# Patient Record
Sex: Male | Born: 1960 | Race: White | Hispanic: No | Marital: Married | Smoking: Former smoker
Health system: Southern US, Community
[De-identification: ages and names within clinical notes are randomized; demographics above are authoritative.]

## PROBLEM LIST (undated history)

## (undated) DIAGNOSIS — F331 Major depressive disorder, recurrent, moderate: Secondary | ICD-10-CM

## (undated) DIAGNOSIS — Z720 Tobacco use: Secondary | ICD-10-CM

## (undated) DIAGNOSIS — S62102A Fracture of unspecified carpal bone, left wrist, initial encounter for closed fracture: Secondary | ICD-10-CM

## (undated) DIAGNOSIS — M542 Cervicalgia: Secondary | ICD-10-CM

## (undated) DIAGNOSIS — E785 Hyperlipidemia, unspecified: Secondary | ICD-10-CM

## (undated) DIAGNOSIS — K219 Gastro-esophageal reflux disease without esophagitis: Secondary | ICD-10-CM

## (undated) DIAGNOSIS — K449 Diaphragmatic hernia without obstruction or gangrene: Secondary | ICD-10-CM

## (undated) DIAGNOSIS — G8929 Other chronic pain: Secondary | ICD-10-CM

## (undated) DIAGNOSIS — Z820 Family history of epilepsy and other diseases of the nervous system: Secondary | ICD-10-CM

## (undated) HISTORY — DX: Diaphragmatic hernia without obstruction or gangrene: K44.9

## (undated) HISTORY — DX: Cervicalgia: M54.2

## (undated) HISTORY — DX: Fracture of unspecified carpal bone, left wrist, initial encounter for closed fracture: S62.102A

## (undated) HISTORY — DX: Major depressive disorder, recurrent, moderate: F33.1

## (undated) HISTORY — DX: Tobacco use: Z72.0

## (undated) HISTORY — DX: Hyperlipidemia, unspecified: E78.5

## (undated) HISTORY — DX: Other chronic pain: G89.29

## (undated) HISTORY — DX: Gastro-esophageal reflux disease without esophagitis: K21.9

## (undated) HISTORY — DX: Family history of epilepsy and other diseases of the nervous system: Z82.0

## (undated) HISTORY — PX: NECK SURGERY: SHX720

---

## 2004-04-10 ENCOUNTER — Ambulatory Visit: Payer: Self-pay | Admitting: Internal Medicine

## 2004-05-20 ENCOUNTER — Ambulatory Visit: Payer: Self-pay | Admitting: Internal Medicine

## 2006-02-22 ENCOUNTER — Ambulatory Visit: Payer: Self-pay | Admitting: Family Medicine

## 2006-02-24 ENCOUNTER — Ambulatory Visit: Payer: Self-pay | Admitting: Family Medicine

## 2008-11-14 ENCOUNTER — Ambulatory Visit: Payer: Self-pay | Admitting: Family Medicine

## 2008-11-14 DIAGNOSIS — R5383 Other fatigue: Secondary | ICD-10-CM | POA: Insufficient documentation

## 2008-11-15 DIAGNOSIS — E785 Hyperlipidemia, unspecified: Secondary | ICD-10-CM | POA: Insufficient documentation

## 2008-11-15 DIAGNOSIS — K219 Gastro-esophageal reflux disease without esophagitis: Secondary | ICD-10-CM | POA: Insufficient documentation

## 2008-11-20 ENCOUNTER — Telehealth (INDEPENDENT_AMBULATORY_CARE_PROVIDER_SITE_OTHER): Payer: Self-pay | Admitting: *Deleted

## 2008-11-21 ENCOUNTER — Ambulatory Visit: Payer: Self-pay

## 2008-11-21 ENCOUNTER — Encounter (HOSPITAL_COMMUNITY)
Admission: RE | Admit: 2008-11-21 | Discharge: 2009-02-19 | Payer: Self-pay | Source: Home / Self Care | Admitting: Cardiovascular Disease

## 2008-11-21 ENCOUNTER — Ambulatory Visit: Payer: Self-pay | Admitting: Cardiovascular Disease

## 2008-11-22 ENCOUNTER — Telehealth: Payer: Self-pay | Admitting: Family Medicine

## 2008-12-19 ENCOUNTER — Ambulatory Visit: Payer: Self-pay | Admitting: Family Medicine

## 2008-12-19 DIAGNOSIS — E291 Testicular hypofunction: Secondary | ICD-10-CM | POA: Insufficient documentation

## 2008-12-21 LAB — CONVERTED CEMR LAB: Testosterone: 195.43 ng/dL — ABNORMAL LOW (ref 350.00–890.00)

## 2008-12-30 ENCOUNTER — Encounter: Payer: Self-pay | Admitting: Family Medicine

## 2009-01-11 ENCOUNTER — Ambulatory Visit: Payer: Self-pay | Admitting: Family Medicine

## 2009-01-14 LAB — CONVERTED CEMR LAB
ALT: 33 units/L (ref 0–53)
AST: 22 units/L (ref 0–37)
Alkaline Phosphatase: 69 units/L (ref 39–117)
Bilirubin, Direct: 0 mg/dL (ref 0.0–0.3)
Testosterone: 291.86 ng/dL — ABNORMAL LOW (ref 350.00–890.00)
Total Bilirubin: 0.5 mg/dL (ref 0.3–1.2)
Total Protein: 6.3 g/dL (ref 6.0–8.3)

## 2009-01-22 ENCOUNTER — Telehealth: Payer: Self-pay | Admitting: Family Medicine

## 2009-01-30 ENCOUNTER — Ambulatory Visit (HOSPITAL_COMMUNITY): Admission: RE | Admit: 2009-01-30 | Discharge: 2009-01-31 | Payer: Self-pay | Admitting: Orthopedic Surgery

## 2009-02-20 ENCOUNTER — Telehealth: Payer: Self-pay | Admitting: Family Medicine

## 2009-02-27 ENCOUNTER — Telehealth: Payer: Self-pay | Admitting: Family Medicine

## 2009-03-03 ENCOUNTER — Emergency Department (HOSPITAL_COMMUNITY)
Admission: EM | Admit: 2009-03-03 | Discharge: 2009-03-03 | Payer: Self-pay | Source: Home / Self Care | Admitting: Emergency Medicine

## 2009-03-04 ENCOUNTER — Telehealth (INDEPENDENT_AMBULATORY_CARE_PROVIDER_SITE_OTHER): Payer: Self-pay | Admitting: *Deleted

## 2009-03-06 ENCOUNTER — Ambulatory Visit: Payer: Self-pay | Admitting: Family Medicine

## 2009-03-06 DIAGNOSIS — F411 Generalized anxiety disorder: Secondary | ICD-10-CM | POA: Insufficient documentation

## 2009-04-17 ENCOUNTER — Telehealth: Payer: Self-pay | Admitting: Family Medicine

## 2009-05-08 ENCOUNTER — Ambulatory Visit: Payer: Self-pay | Admitting: Family Medicine

## 2009-07-22 ENCOUNTER — Ambulatory Visit: Payer: Self-pay | Admitting: Family Medicine

## 2010-01-22 ENCOUNTER — Encounter: Payer: Self-pay | Admitting: Family Medicine

## 2010-01-22 ENCOUNTER — Ambulatory Visit: Payer: Self-pay | Admitting: Family Medicine

## 2010-01-22 DIAGNOSIS — M5030 Other cervical disc degeneration, unspecified cervical region: Secondary | ICD-10-CM | POA: Insufficient documentation

## 2010-01-22 DIAGNOSIS — Z87891 Personal history of nicotine dependence: Secondary | ICD-10-CM | POA: Insufficient documentation

## 2010-01-23 LAB — CONVERTED CEMR LAB
ALT: 41 units/L (ref 0–53)
Albumin: 3.9 g/dL (ref 3.5–5.2)
BUN: 13 mg/dL (ref 6–23)
CO2: 30 meq/L (ref 19–32)
Calcium: 9.4 mg/dL (ref 8.4–10.5)
Creatinine, Ser: 1 mg/dL (ref 0.4–1.5)
Eosinophils Absolute: 0.1 10*3/uL (ref 0.0–0.7)
GFR calc non Af Amer: 86.32 mL/min (ref >60.00)
Glucose, Bld: 74 mg/dL (ref 70–99)
Lymphocytes Relative: 33.6 % (ref 12.0–46.0)
MCHC: 34.6 g/dL (ref 30.0–36.0)
Monocytes Absolute: 0.8 10*3/uL (ref 0.1–1.0)
Monocytes Relative: 11.1 % (ref 3.0–12.0)
Neutro Abs: 4 10*3/uL (ref 1.4–7.7)
Neutrophils Relative %: 53.8 % (ref 43.0–77.0)
Total Protein: 6.6 g/dL (ref 6.0–8.3)

## 2010-01-28 ENCOUNTER — Telehealth: Payer: Self-pay | Admitting: Family Medicine

## 2010-01-30 ENCOUNTER — Observation Stay (HOSPITAL_COMMUNITY)
Admission: RE | Admit: 2010-01-30 | Discharge: 2010-01-31 | Payer: Self-pay | Source: Home / Self Care | Attending: Orthopedic Surgery | Admitting: Orthopedic Surgery

## 2010-03-02 LAB — CONVERTED CEMR LAB
ALT: 25 units/L (ref 0–53)
Albumin: 4 g/dL (ref 3.5–5.2)
Alkaline Phosphatase: 88 units/L (ref 39–117)
Basophils Absolute: 0 10*3/uL (ref 0.0–0.1)
CO2: 25 meq/L (ref 19–32)
Calcium: 9.4 mg/dL (ref 8.4–10.5)
Chloride: 105 meq/L (ref 96–112)
Direct LDL: 200.4 mg/dL
Eosinophils Absolute: 0.1 10*3/uL (ref 0.0–0.7)
HCT: 43.7 % (ref 39.0–52.0)
Lymphocytes Relative: 18.7 % (ref 12.0–46.0)
Lymphs Abs: 2.6 10*3/uL (ref 0.7–4.0)
MCHC: 34.3 g/dL (ref 30.0–36.0)
MCV: 93.6 fL (ref 78.0–100.0)
Sodium: 141 meq/L (ref 135–145)
Total Bilirubin: 0.8 mg/dL (ref 0.3–1.2)
Total Protein: 6.9 g/dL (ref 6.0–8.3)

## 2010-03-04 NOTE — Progress Notes (Signed)
Summary: ER f/u  Phone Note Call from Patient Call back at Home Phone [phone]   Caller: Spouse Call For: Matthew Beat MD Summary of Call: Patient's wife called stating that patient was seen in the ER last night for panic attacks and an allergic reaction and was told to f/u with there primary doctor on today.  You have no open appts.  Should I double book them or schedule for another day?  Please advise Initial call taken by: Linde Gillis CMA Duncan Dull),  March 04, 2009 11:46 AM  Follow-up for Phone Call        Called patient and reviewed case. No breathing difficulties. Had some rash yest. Had some trouble breathing, went to ER  Felt to have some anxiety attacks - has ativan  Lyla Son, please schedule him an appointment with me on Wednesday morning. and call him Follow-up by: Matthew Beat MD,  March 04, 2009 2:01 PM  Additional Follow-up for Phone Call Additional follow up Details #1::        I called pt. and he said he could be here @ 8:00 on Wednesday,Feb.2.  I'll put him on the schedule when I open it up on Tuesday afternoon. Additional Follow-up by: Beau Fanny,  March 04, 2009 2:11 PM    Additional Follow-up for Phone Call Additional follow up Details #2::    ok Follow-up by: Matthew Beat MD,  March 04, 2009 2:22 PM

## 2010-03-04 NOTE — Assessment & Plan Note (Signed)
Summary: ANXIETY/CLE   Vital Signs:  Patient profile:   50 year old male Weight:      164.75 pounds BMI:     24.42 Temp:     98.1 degrees F oral Pulse rate:   60 / minute Pulse rhythm:   regular BP sitting:   110 / 70  (right arm) Cuff size:   regular  Vitals Entered By: Linde Gillis CMA Duncan Dull) (March 06, 2009 8:43 AM) CC: allergic reaction   History of Present Illness: 50 year old male:  Just had some neck surgery. Wearing a cervical collar.   Feels like the room feels stuffy - like there is not much air moving felt like he could not breathe much. Had to go outside. Had some blotches.   Felt a little better last night. But this morning - felt like he was hot this morning.   Having some difficulty with sleeping. He is interested in doing some things. Limited some with his neck. Not sleeping well  Went to ER, allergy?, anxiety Has some Ativan that he has used a couple of times  ROS: GEN: No acute illnesses, no fevers, chills, sweats, fatigue, weight loss, or URI sx. GI: No n/v/d Pulm: No SOB, cough, wheezing Interactive and getting along well at home.  Otherwise, ROS is as per the HPI.    GEN: Well-developed,well-nourished,in no acute distress; alert,appropriate and cooperative throughout examination HEENT: Normocephalic and atraumatic without obvious abnormalities. No apparent alopecia or balding. Ears, externally no deformities PULM: Breathing comfortably in no respiratory distress EXT: No clubbing, cyanosis, or edema PSYCH: Normally interactive. Cooperative during the interview. Pleasant. Friendly and conversant. Not anxious or depressed appearing. Normal, full affect.   Current Problems (verified): 1)  Hypogonadism  (ICD-257.2) 2)  Health Maintenance Exam  (ICD-V70.0) 3)  Gerd  (ICD-530.81) 4)  Hyperlipidemia  (ICD-272.4) 5)  Fatigue  (ICD-780.79) 6)  Special Screening Malignant Neoplasm of Prostate  (ICD-V76.44)  Allergies (verified): No Known Drug  Allergies  Past History:  Past medical, surgical, family and social histories (including risk factors) reviewed, and no changes noted (except as noted below).  Past Medical History: Reviewed history from 12/30/2008 and no changes required. Neck pain, cervical spine with herniation Hyperlipidemia Tobacco abuse, 30 years GERD L wrist fx, 1980's  Family History: Reviewed history from 11/14/2008 and no changes required. Denies cardiac history  Social History: Reviewed history from 11/14/2008 and no changes required. Marital Status: Married Occupation:  Current Smoker Alcohol use-yes Drug use-no Regular exercise-no   Impression & Recommendations:  Problem # 1:  ANXIETY STATE, UNSPECIFIED (ICD-300.00) acute anxiety - somewhat situational with neck surgery  benzo for short term check tsh, test levels  His updated medication list for this problem includes:    Lorazepam 1 Mg Tabs (Lorazepam) .Marland Kitchen... 1/2 to 1 tab by mouth q 8 hours as needed anxiety  Problem # 2:  HYPOGONADISM (ICD-257.2)  Orders: Venipuncture (16109) TLB-Testosterone, Total (84403-TESTO)  Complete Medication List: 1)  Crestor 40 Mg Tabs (Rosuvastatin calcium) .... Take 1 tablet at bed times 2)  Aspirin 81 Mg Tabs (Aspirin) .... Take 1 tablet by mouth once a day 3)  Androgel Pump 1 % Gel (Testosterone) .... Apply 10 grams to dry, clean shoulder area once daily 4)  Allergy Relief 25 Mg Caps (Diphenhydramine hcl) .... Take one tablet by mouth as needed 5)  Sucrets Sore Throat 2 Mg Lozg (Dyclonine hcl) .... Take one lozenge as needed 6)  Ibuprofen 200 Mg Tabs (Ibuprofen) .... One  tablet by mouth as needed for pain 7)  Oxycodone-acetaminophen 10-325 Mg Tabs (Oxycodone-acetaminophen) .... Take one tablet by mouth every six hours as needed for pain 8)  Acid Reducer 75 Mg Tabs (Ranitidine hcl) .... Take as needed 9)  Cepacol Sore Throat 5.4 Mg Lozg (Menthol) .... Take as needed 10)  Lorazepam 1 Mg Tabs (Lorazepam)  .... 1/2 to 1 tab by mouth q 8 hours as needed anxiety  Other Orders: TLB-TSH (Thyroid Stimulating Hormone) (84443-TSH) Prescriptions: LORAZEPAM 1 MG TABS (LORAZEPAM) 1/2 to 1 tab by mouth q 8 hours as needed anxiety  #15 x 0   Entered and Authorized by:   Hannah Beat MD   Signed by:   Hannah Beat MD on 03/06/2009   Method used:   Print then Give to Patient   RxID:   [id number]   Current Allergies (reviewed today): No known allergies

## 2010-03-04 NOTE — Progress Notes (Signed)
Summary: Question about Androgel  Phone Note Call from Patient Call back at [phone]   Caller: Spouse Call For: Dr. Ermalene Searing Summary of Call: Pt finished the Androgel today and has no refills. Pt wants to know if he should stay on Androgel and if so will need to be refilled or should pt stay off Androgel and get an appt to be seen. Pt uses [facility] on [id number]. Pt's wife would like call back at [phone] to advise Dr. Daphine Deutscher decision.Please advise.  Initial call taken by: Lewanda Rife LPN,  February 20, 2009 10:44 AM  Follow-up for Phone Call        Likely remain on this med, but await [facility]'s recs tommorow.  Follow-up by: Kerby Nora MD,  February 20, 2009 1:22 PM  Additional Follow-up for Phone Call Additional follow up Details #1::        Patients wife advised.Consuello Masse CMA  Additional Follow-up by: Benny Lennert CMA Duncan Dull),  February 20, 2009 3:59 PM    Additional Follow-up for Phone Call Additional follow up Details #2::    Refilled.  They have had multiple questions in the past.   He needs follow-up labs: Check Total and Free testosterone:257.2  f/u with me 1 week after this lab Follow-up by: Hannah Beat MD,  February 20, 2009 6:01 PM  Additional Follow-up for Phone Call Additional follow up Details #3:: Details for Additional Follow-up Action Taken: Patient requested message be left on machine to advise instructions also called rx to pharamcy Additional Follow-up by: Benny Lennert CMA (AAMA),  February 21, 2009 10:05 AM  Prescriptions: ANDROGEL PUMP 1 % GEL (TESTOSTERONE) Apply 10 grams to dry, clean shoulder area once daily  #2 x 0   Entered by:   Hannah Beat MD   Authorized by:   Kerby Nora MD   Signed by:   Hannah Beat MD on 02/20/2009   Method used:   Telephoned to ...       [facility]       [address]       [place], [place]  [address]       Ph: [phone]       Fax: [phone]   RxID:    [id number]

## 2010-03-04 NOTE — Progress Notes (Signed)
Summary: Prior Authorization for Androgel  Phone Note Outgoing Call Call back at [phone]   Call placed by: Linde Gillis CMA Duncan Dull),  February 27, 2009 8:32 AM Call placed to: [employer] Summary of Call: Called [employer] spoke to [place] [facility] and was advised that this Rx was approved from 12/04/2008 through 12/24/2013.  Pharmacy was able to get claim paid for this medication on 02/24/2009.  Pharmacy notified.   Initial call taken by: Linde Gillis CMA Duncan Dull),  February 27, 2009 8:34 AM     Appended Document: Prior Authorization for [employer], [address], [place].  [phone]

## 2010-03-04 NOTE — Progress Notes (Signed)
Summary: wants to go back on lorazepam  Phone Note Call from Patient Call back at [facility] Phone [phone] Call back at [phone], [phone]   Caller: Spouse- Cala Bradford Summary of Call: Pt's wife thinks that pt needs to go back on lorazepam.  She says he is getting depressed, irritable, not sleeping.  Anxious because he has not been able to go back to work since back surgery.  Wife got him otc sleep aid but that doesnt help.  She says he did sleep better with lorazepam, was more on an even keel.   U[facility].  She knows you are out of the office today. Initial call taken by: Lowella Petties CMA,  April 17, 2009 8:29 AM  Follow-up for Phone Call        ok to call in as below - for sparing use. with symtoms above, some anxiety and depression, should follow-up within the next couple weeks Follow-up by: Hannah Beat MD,  April 17, 2009 6:31 PM  Additional Follow-up for Phone Call Additional follow up Details #1::        Spoke with carol the pharmacist [facility] she will get the script ready also spoke to patients wife and they will call for patient to schedule appt Additional Follow-up by: Benny Lennert CMA Duncan Dull),  April 18, 2009 9:47 AM     Prescriptions: LORAZEPAM 1 MG TABS (LORAZEPAM) 1/2 to 1 tab by mouth q 8 hours as needed anxiety  #30 x 0   Entered and Authorized by:   Hannah Beat MD   Signed by:   Hannah Beat MD on 04/17/2009   Method used:   Telephoned to ...       [facility]       [address]       [place], [place]  [address]       Ph: [phone]       Fax: [phone]   RxID:   [id number]

## 2010-03-04 NOTE — Assessment & Plan Note (Signed)
Summary: FOLLOW UP   Vital Signs:  Patient profile:   50 year old male Height:      69 inches Weight:      172.0 pounds BMI:     25.49 Temp:     98.2 degrees F oral Pulse rate:   60 / minute Pulse rhythm:   regular BP sitting:   110 / 78  (left arm) Cuff size:   regular  Vitals Entered By: Benny Lennert CMA Duncan Dull) (May 08, 2009 10:11 AM)  History of Present Illness: Chief complaint follow up anxiety  Anxiety: still having some nervew issues.   will feel like he cannot sleep. Felt like the relaxed and slept better with some ativan and did a lot better.      Allergies (verified): No Known Drug Allergies   Impression & Recommendations:  Problem # 1:  ANXIETY STATE, UNSPECIFIED (ICD-300.00) Assessment Deteriorated >25 minutes spent in face to face time with patient, >50% spent in counselling or coordination of care: continued anxiety, not doing well per wife, irritable, probs dealing with not working, some verbal arguments with family. Has had to use more ativan  His updated medication list for this problem includes:    Lorazepam 1 Mg Tabs (Lorazepam) .Marland Kitchen... 1/2 to 1 tab by mouth q 8 hours as needed anxiety    Citalopram Hydrobromide 20 Mg Tabs (Citalopram hydrobromide) .Marland Kitchen... 1 by mouth daily  Complete Medication List: 1)  Aspirin 81 Mg Tabs (Aspirin) .... Take 1 tablet by mouth once a day 2)  Androgel Pump 1 % Gel (Testosterone) .... Apply 10 grams to dry, clean shoulder area once daily 3)  Allergy Relief 25 Mg Caps (Diphenhydramine hcl) .... Take one tablet by mouth as needed 4)  Sucrets Sore Throat 2 Mg Lozg (Dyclonine hcl) .... Take one lozenge as needed 5)  Ibuprofen 200 Mg Tabs (Ibuprofen) .... One tablet by mouth as needed for pain 6)  Oxycodone-acetaminophen 10-325 Mg Tabs (Oxycodone-acetaminophen) .... Take one tablet by mouth every six hours as needed for pain 7)  Acid Reducer 75 Mg Tabs (Ranitidine hcl) .... Take as needed 8)  Cepacol Sore Throat 5.4 Mg Lozg  (Menthol) .... Take as needed 9)  Lorazepam 1 Mg Tabs (Lorazepam) .... 1/2 to 1 tab by mouth q 8 hours as needed anxiety 10)  Citalopram Hydrobromide 20 Mg Tabs (Citalopram hydrobromide) .Marland Kitchen.. 1 by mouth daily  Patient Instructions: 1)  2 mo Prescriptions: LORAZEPAM 1 MG TABS (LORAZEPAM) 1/2 to 1 tab by mouth q 8 hours as needed anxiety  #30 x 0   Entered and Authorized by:   Hannah Beat MD   Signed by:   Hannah Beat MD on 05/08/2009   Method used:   Print then Give to Patient   RxID:   [id number] CITALOPRAM HYDROBROMIDE 20 MG TABS (CITALOPRAM HYDROBROMIDE) 1 by mouth daily  #30 x 3   Entered and Authorized by:   Hannah Beat MD   Signed by:   Hannah Beat MD on 05/08/2009   Method used:   Electronically to        [facility]       [address]       [place], [place]  [address]       Ph: [phone]       Fax: [phone]   RxID:   [id number]   Current Allergies (reviewed today): No known allergies

## 2010-03-04 NOTE — Assessment & Plan Note (Signed)
Summary: 2 M F/U DLO   Vital Signs:  Patient profile:   50 year old male Height:      69 inches Weight:      173.4 pounds BMI:     25.70 Temp:     98.1 degrees F oral Pulse rate:   60 / minute Pulse rhythm:   regular BP sitting:   106 / 70  (left arm) Cuff size:   regular  Vitals Entered By: Benny Lennert CMA Duncan Dull) (July 22, 2009 10:21 AM)  History of Present Illness: Chief complaint 2 month follow up  50 year old male:  Anxiety / dep:    Allergies (verified): No Known Drug Allergies   Impression & Recommendations:  Problem # 1:  ANXIETY STATE, UNSPECIFIED (ICD-300.00) Assessment Improved >15 minutes spent in face to face time with patient, >50% spent in counselling or coordination of care: doing much better, effectively not feeling anxious or ill with family members now. Wants to get back to work - limited with neck and RTP.  His updated medication list for this problem includes:    Lorazepam 1 Mg Tabs (Lorazepam) .Marland Kitchen... 1/2 to 1 tab by mouth q 8 hours as needed anxiety    Citalopram Hydrobromide 20 Mg Tabs (Citalopram hydrobromide) .Marland Kitchen... 1 by mouth daily  Problem # 2:  HYPOGONADISM (ICD-257.2) refill   recheck in 6 mo with cpx labs  Complete Medication List: 1)  Aspirin 81 Mg Tabs (Aspirin) .... Take 1 tablet by mouth once a day 2)  Androgel Pump 1 % Gel (Testosterone) .... Apply 10 grams to dry, clean shoulder area once daily 3)  Ibuprofen 200 Mg Tabs (Ibuprofen) .... One tablet by mouth as needed for pain 4)  Oxycodone-acetaminophen 10-325 Mg Tabs (Oxycodone-acetaminophen) .... Take one tablet by mouth every six hours as needed for pain 5)  Acid Reducer 75 Mg Tabs (Ranitidine hcl) .... Take as needed 6)  Cepacol Sore Throat 5.4 Mg Lozg (Menthol) .... Take as needed 7)  Lorazepam 1 Mg Tabs (Lorazepam) .... 1/2 to 1 tab by mouth q 8 hours as needed anxiety 8)  Citalopram Hydrobromide 20 Mg Tabs (Citalopram hydrobromide) .Marland Kitchen.. 1 by mouth daily  Patient  Instructions: 1)  f/u 6 mo Prescriptions: ANDROGEL PUMP 1 % GEL (TESTOSTERONE) Apply 10 grams to dry, clean shoulder area once daily  #2 x 5   Entered and Authorized by:   Hannah Beat MD   Signed by:   Hannah Beat MD on 07/22/2009   Method used:   Print then Give to Patient   RxID:   [id number]   Current Allergies (reviewed today): No known allergies

## 2010-03-06 NOTE — Progress Notes (Signed)
Summary: Citalopram  Phone Note Refill Request Message from:  [facility] on January 28, 2010 9:48 AM  Refills Requested: Medication #1:  CITALOPRAM HYDROBROMIDE 20 MG TABS 1 by mouth daily   Last Refilled: 12/30/2009 Received faxed refill request please advise.   Method Requested: Telephone to Pharmacy Initial call taken by: Linde Gillis CMA Duncan Dull),  January 28, 2010 9:54 AM  Follow-up for Phone Call        patient seen last week.  refill x 1 year Follow-up by: Hannah Beat MD,  January 28, 2010 10:04 AM    Prescriptions: CITALOPRAM HYDROBROMIDE 20 MG TABS (CITALOPRAM HYDROBROMIDE) 1 by mouth daily  #30 Each x 11   Entered by:   Benny Lennert CMA (AAMA)   Authorized by:   Hannah Beat MD   Signed by:   Benny Lennert CMA (AAMA) on 01/28/2010   Method used:   Electronically to        [facility] (retail)       [address]       [place], [place]  [address]       Ph: [phone]       Fax: [phone]   RxID:   [id number]

## 2010-03-06 NOTE — Letter (Signed)
Summary: *Consult Note  [place] [facility]  [address] [place], [place] [address]   Phone: [phone]  Fax: [phone]    Re:    DIARRA KOS DOB:    Jun 29, 1960   Dear Dr. Venita Lick:   Thank you for requesting that we see the above patient for consultation.  [place] is doing well, he had a normal nuclear stress test in 2010 which we reviewed. He is a former smoker but had normal spirometry in the office today.  He accepts the risks of surgery that you have reviewed with him and is cleared from a medical and cardiac standpoint. My note and labwork is enclosed.   After today's visit, the patients current medications include: 1)  ASPIRIN 81 MG  TABS (ASPIRIN) Take 1 tablet by mouth once a day 2)  ANDROGEL PUMP 1 % GEL (TESTOSTERONE) Apply 10 grams to dry, clean shoulder area once daily 3)  IBUPROFEN 200 MG TABS (IBUPROFEN) one tablet by mouth as needed for pain 4)  ACID REDUCER 75 MG TABS (RANITIDINE HCL) take as needed 5)  CEPACOL SORE THROAT 5.4 MG LOZG (MENTHOL) take as needed 6)  LORAZEPAM 1 MG TABS (LORAZEPAM) 1/2 to 1 tab by mouth q 8 hours as needed anxiety 7)  CITALOPRAM HYDROBROMIDE 20 MG TABS (CITALOPRAM HYDROBROMIDE) 1 by mouth daily 8)  NORCO 5-325 MG TABS (HYDROCODONE-ACETAMINOPHEN) one tablet every 4-6 hours as needed for pain   Sincerely,   Hannah Beat MD

## 2010-03-06 NOTE — Assessment & Plan Note (Signed)
Summary: SURGICAL CLEARANCE  FOR NECK   Vital Signs:  Patient profile:   50 year old male Height:      69 inches Weight:      174.12 pounds BMI:     25.81 Temp:     98.1 degrees F oral Pulse rate:   60 / minute Pulse rhythm:   regular BP sitting:   110 / 70  (left arm) Cuff size:   regular  Vitals Entered By: Benny Lennert CMA Duncan Dull) (January 22, 2010 10:33 AM)   History of Present Illness: Chief complaint surgical clearence for neck surgery  Dr. Shon Baton has requested a consultation for evaluation for preoperative clearance for c5-6 neck operation for probable fusion and revision of hardware.  No active chest pain, no sob, no wheezing.  Being done under general anesthesia. Patient continues with neck pain, shoulder, shoulder blade pain.   Quit smoking about 1 year ago, but he has a 30 pack year smoking history.  Allergies (verified): No Known Drug Allergies  Past History:  Past medical, surgical, family and social histories (including risk factors) reviewed, and no changes noted (except as noted below).  Past Medical History: Reviewed history from 12/30/2008 and no changes required. Neck pain, cervical spine with herniation Hyperlipidemia Tobacco abuse, 30 years GERD L wrist fx, 1980's  Family History: Reviewed history from 11/14/2008 and no changes required. Denies cardiac history  Social History: Reviewed history from 11/14/2008 and no changes required. Marital Status: Married Occupation:  Current Smoker Alcohol use-yes Drug use-no Regular exercise-no  Review of Systems      See HPI       Otherwise, the pertinent positives and negatives are listed above and in the HPI, otherwise a full review of systems has been reviewed and is negative unless noted positive.  General:  Denies chills, fatigue, and fever. CV:  Denies chest pain or discomfort and shortness of breath with exertion. Resp:  Denies cough, shortness of breath, sputum productive, and  wheezing.  Physical Exam  General:  Well-developed,well-nourished,in no acute distress; alert,appropriate and cooperative throughout examination Head:  Normocephalic and atraumatic without obvious abnormalities. No apparent alopecia or balding. Eyes:  pupils equal, pupils round, and pupils reactive to light.   Ears:  External ear exam shows no significant lesions or deformities.  Otoscopic examination reveals clear canals, tympanic membranes are intact bilaterally without bulging, retraction, inflammation or discharge. Hearing is grossly normal bilaterally. Nose:  External nasal examination shows no deformity or inflammation. Nasal mucosa are pink and moist without lesions or exudates. Mouth:  Oral mucosa and oropharynx without lesions or exudates.  Teeth in good repair. Neck:  No deformities, masses, or tenderness noted. Chest Wall:  No deformities, masses, tenderness or gynecomastia noted. Lungs:  Normal respiratory effort, chest expands symmetrically. Lungs are clear to auscultation, no crackles or wheezes. Heart:  Normal rate and regular rhythm. S1 and S2 normal without gallop, murmur, click, rub or other extra sounds. Abdomen:  Bowel sounds positive,abdomen soft and non-tender without masses, organomegaly or hernias noted. Extremities:  No clubbing, cyanosis, edema, or deformity noted with normal full range of motion of all joints.   Neurologic:  alert & oriented X3 and gait normal.   Skin:  Intact without suspicious lesions or rashes Cervical Nodes:  No lymphadenopathy noted Psych:  Cognition and judgment appear intact. Alert and cooperative with normal attention span and concentration. No apparent delusions, illusions, hallucinations   Impression & Recommendations:  Problem # 1:  PREOPERATIVE EXAMINATION (ICD-V72.84) Recommendations:  EKG: Normal sinus rhythm. Normal axis, normal R wave progression, inverted T III and avF, IRBBB.  11/21/2008 Stress Nuclear Study normal  Check  labs, normal stress test, normal spirometry. If labs normal, patient willing to accept potential risks of surgery, recommend that the patient be considered clear from a medical and cardiac perspective for neck surgery.  cc: Dr. Shon Baton  Orders: EKG w/ Interpretation (93000) Spirometry w/Graph (94010)  Problem # 2:  DISC DISEASE, CERVICAL (ICD-722.4)  Orders: Venipuncture ([address]109) TLB-BMP (Basic Metabolic Panel-BMET) (80048-METABOL) TLB-CBC Platelet - w/Differential (85025-CBCD) TLB-Hepatic/Liver Function Pnl (80076-HEPATIC) TLB-TSH (Thyroid Stimulating Hormone) (84443-TSH)  Problem # 3:  TOBACCO ABUSE, HX OF (ICD-V15.82) Assessment: Improved  Spirometry normal. Greater than predicted FEV and FEV/FVC, normal  Orders: EKG w/ Interpretation (93000) Spirometry w/Graph (94010)  Complete Medication List: 1)  Aspirin 81 Mg Tabs (Aspirin) .... Take 1 tablet by mouth once a day 2)  Androgel Pump 1 % Gel (Testosterone) .... Apply 10 grams to dry, clean shoulder area once daily 3)  Ibuprofen 200 Mg Tabs (Ibuprofen) .... One tablet by mouth as needed for pain 4)  Acid Reducer 75 Mg Tabs (Ranitidine hcl) .... Take as needed 5)  Cepacol Sore Throat 5.4 Mg Lozg (Menthol) .... Take as needed 6)  Lorazepam 1 Mg Tabs (Lorazepam) .... 1/2 to 1 tab by mouth q 8 hours as needed anxiety 7)  Citalopram Hydrobromide 20 Mg Tabs (Citalopram hydrobromide) .Marland Kitchen.. 1 by mouth daily 8)  Norco 5-325 Mg Tabs (Hydrocodone-acetaminophen) .... One tablet every 4-6 hours as needed for pain   Orders Added: 1)  Venipuncture [36415] 2)  TLB-BMP (Basic Metabolic Panel-BMET) [80048-METABOL] 3)  TLB-CBC Platelet - w/Differential [85025-CBCD] 4)  TLB-Hepatic/Liver Function Pnl [80076-HEPATIC] 5)  TLB-TSH (Thyroid Stimulating Hormone) [84443-TSH] 6)  Consultation Level IV [99244] 7)  EKG w/ Interpretation [93000] 8)  Spirometry w/Graph [94010]    Current Allergies (reviewed today): No known  allergies   Appended Document: SURGICAL CLEARANCE  FOR NECK Copy of office note, EKG and spirometry faxed to [facility] pre admit department for upcoming appt.

## 2010-04-07 ENCOUNTER — Telehealth (INDEPENDENT_AMBULATORY_CARE_PROVIDER_SITE_OTHER): Payer: Self-pay | Admitting: *Deleted

## 2010-04-14 LAB — BASIC METABOLIC PANEL
BUN: 13 mg/dL (ref 6–23)
CO2: 29 mEq/L (ref 19–32)
Calcium: 9.7 mg/dL (ref 8.4–10.5)
Chloride: 106 mEq/L (ref 96–112)
GFR calc Af Amer: >60 mL/min (ref >60)
GFR calc non Af Amer: >60 mL/min (ref >60)
Potassium: 4.3 mEq/L (ref 3.5–5.1)
Sodium: 141 mEq/L (ref 135–145)

## 2010-04-14 LAB — CBC
Hemoglobin: 14.5 g/dL (ref 13.0–17.0)
MCHC: 33.1 g/dL (ref 30.0–36.0)
MCV: 93.8 fL (ref 78.0–100.0)
RBC: 4.67 MIL/uL (ref 4.22–5.81)
RDW: 13.2 % (ref 11.5–15.5)

## 2010-04-14 LAB — SURGICAL PCR SCREEN: MRSA, PCR: NEGATIVE

## 2010-04-15 NOTE — Progress Notes (Signed)
Summary: lorazepam  Phone Note Refill Request Message from:  Fax from Pharmacy on April 07, 2010 11:28 AM  Refills Requested: Medication #1:  LORAZEPAM 1 MG TABS 1/2 to 1 tab by mouth q 8 hours as needed anxiety   Last Refilled: 05/20/2009 Refill request from [facility] on [address] [phone]  Initial call taken by: Melody Comas,  April 07, 2010 11:29 AM  Follow-up for Phone Call        Rx called to pharmacy Follow-up by: Benny Lennert CMA (AAMA),  April 07, 2010 1:21 PM    Prescriptions: LORAZEPAM 1 MG TABS (LORAZEPAM) 1/2 to 1 tab by mouth q 8 hours as needed anxiety  #30 x 0   Entered and Authorized by:   Hannah Beat MD   Signed by:   Hannah Beat MD on 04/07/2010   Method used:   Telephoned to ...       [facility]       [address]       [place], [place]  [address]       Ph: [phone]       Fax: [phone]   RxID:   [id number]

## 2010-04-20 LAB — CBC
HCT: 44 % (ref 39.0–52.0)
MCHC: 33.4 g/dL (ref 30.0–36.0)
MCV: 94.1 fL (ref 78.0–100.0)
WBC: 5.9 10*3/uL (ref 4.0–10.5)

## 2010-04-20 LAB — BASIC METABOLIC PANEL
Calcium: 9.1 mg/dL (ref 8.4–10.5)
Chloride: 108 mEq/L (ref 96–112)
GFR calc Af Amer: >60 mL/min (ref >60)
GFR calc non Af Amer: >60 mL/min (ref >60)
Glucose, Bld: 96 mg/dL (ref 70–99)

## 2010-04-20 LAB — TSH: TSH: 1.187 u[IU]/mL (ref 0.350–4.500)

## 2010-04-20 LAB — DIFFERENTIAL
Basophils Absolute: 0 10*3/uL (ref 0.0–0.1)
Eosinophils Absolute: 0 10*3/uL (ref 0.0–0.7)
Eosinophils Relative: 0 % (ref 0–5)
Monocytes Absolute: 0.5 10*3/uL (ref 0.1–1.0)
Neutro Abs: 4.1 10*3/uL (ref 1.7–7.7)

## 2010-04-20 LAB — D-DIMER, QUANTITATIVE: D-Dimer, Quant: 0.31 ug/mL-FEU (ref 0.00–0.48)

## 2010-04-29 ENCOUNTER — Encounter: Payer: Self-pay | Admitting: Family Medicine

## 2010-05-05 LAB — CBC
HCT: 44 % (ref 39.0–52.0)
MCHC: 33.7 g/dL (ref 30.0–36.0)
MCV: 94.2 fL (ref 78.0–100.0)
Platelets: 206 10*3/uL (ref 150–400)
RDW: 13.6 % (ref 11.5–15.5)
WBC: 8.8 10*3/uL (ref 4.0–10.5)

## 2010-08-04 ENCOUNTER — Telehealth: Payer: Self-pay | Admitting: *Deleted

## 2010-08-04 NOTE — Telephone Encounter (Signed)
Noted, happy to see in office at his discretion.

## 2010-08-04 NOTE — Telephone Encounter (Signed)
Triage Record Num: [id number] Operator: Reino Bellis Patient Name: Matthew Eaton Call Date & Time: 08/02/2010 2:28:12PM Patient Phone: [phone] PCP: Hannah Beat Patient Gender: Male PCP Fax : [phone] Patient DOB: 1960/08/10 Practice Name: [facility] Reason for Call: De Burrs, calling regarding Other. PCP is Copland, [place] number is [phone]. Kimberly/Wife calling requesting something be called in for her husband's nerves. Pt. feeling very anxious, "cannot be still" and is pacing around the facility at times d/t his mother's declining health. Onset: 08/02/10. All emergent sxs r/o per Anxiety Protocol except anxiety symptoms increasing in frequency or length. Advised pt. to see MD within 72 hours. Instructed pt. to call in am on 08/04/10 to schedule an appt. and care advice given. Protocol(s) Used: Anxiety Recommended Outcome per Protocol: See Provider within 72 Hours Reason for Outcome: Anxiety symptoms increasing in frequency or length Care Advice: Avoid the use of stimulants including caffeine (coffee, some soft drinks, some energy drinks, tea and chocolate), cocaine, and amphetamines. Also avoid drinking alcohol. ~ After provider evaluation, get at least 30-60 minutes of moderate aerobic exercise, preferably each day; exercise 3 to 4 hours before you want to sleep. Moderate aerobic exercise is generally defined as requiring about as much energy as walking 2 miles in 30 minutes. ~ Exercise, yoga, massage, relaxation, prayer, music, a hot bath, or journal writing are all ways that may help calm a person. Try these and find one or more that helps to relieve your anxiety and practice it when feeling stressed or worried. ~ ~ Eat a balanced diet and follow a regular sleep schedule with adequate sleep, about 7 to 8 hours a night. Share your concern with a professional counselor or an individual you feel can help you cope by listening  objectively to your concerns. ~ Worry/Anxiety: - Does not prevent bad things from happening. - Is often about things that never happen. - When you start to worry, think about if it will matter in a week, month, or a year. - Talk with provider/counselor about the issues that cause you to worry. ~ 08/02/2010 2:43:06PM Page 1 of 1 [id number]

## 2010-08-05 ENCOUNTER — Other Ambulatory Visit: Payer: Self-pay | Admitting: *Deleted

## 2010-08-05 MED ORDER — LORAZEPAM 1 MG PO TABS: 0.5000 mg | ORAL_TABLET | Freq: Three times a day (TID) | ORAL | Status: DC | PRN

## 2010-08-05 NOTE — Telephone Encounter (Signed)
Very appropriate

## 2010-08-05 NOTE — Telephone Encounter (Signed)
Rx called to [facility] and patient notified as instructed via telephone.

## 2010-08-05 NOTE — Telephone Encounter (Signed)
Patient wife calling and patients mother passed away and patient is having hard time handling this. Wife says patient is almost out of his lorazepam and wants to know if he can have this refilled before the 4th of July. Please advise

## 2010-08-05 NOTE — Telephone Encounter (Signed)
I believe this is appropriate. He was seen last in 01/2010 and last refill of 30 tabs lasted from 04/2010. Despite this being a narcotic I will refill for Dr. Patsy Lager given I am not sure he will be able to address this prior to the holiday. I will forward this note to him.

## 2010-10-28 ENCOUNTER — Other Ambulatory Visit: Payer: Self-pay | Admitting: Otolaryngology

## 2010-10-30 ENCOUNTER — Ambulatory Visit
Admission: RE | Admit: 2010-10-30 | Discharge: 2010-10-30 | Disposition: A | Discharge status: Home / Self Care | Payer: Self-pay | Source: Ambulatory Visit | Attending: Otolaryngology | Admitting: Otolaryngology

## 2011-01-05 ENCOUNTER — Encounter: Payer: Self-pay | Admitting: Family Medicine

## 2011-01-05 ENCOUNTER — Ambulatory Visit (INDEPENDENT_AMBULATORY_CARE_PROVIDER_SITE_OTHER): Payer: Self-pay | Admitting: Family Medicine

## 2011-01-05 DIAGNOSIS — K219 Gastro-esophageal reflux disease without esophagitis: Secondary | ICD-10-CM

## 2011-01-05 DIAGNOSIS — K449 Diaphragmatic hernia without obstruction or gangrene: Secondary | ICD-10-CM | POA: Insufficient documentation

## 2011-01-05 MED ORDER — OMEPRAZOLE 20 MG PO CPDR: 20.0000 mg | DELAYED_RELEASE_CAPSULE | Freq: Two times a day (BID) | ORAL | Status: DC

## 2011-01-05 NOTE — Progress Notes (Signed)
  Subjective:    Patient ID: Matthew Eaton, male    DOB: Mar 11, 1960, 50 y.o.   MRN: [id number]  HPI  Chewing really a lot and still getting stuck in his throat.  Taking some Zantac BID.  But still having some significant symptoms.  Feels like is going to get choked -- used to like to eat steakCan eat it, but it feels like it will get hung up. Will get stuck and then go.   The PMH, PSH, Social History, Family History, Medications, and allergies have been reviewed in [facility], and have been updated if relevant.   Review of Systems  GEN: No acute illnesses, no fevers, chills. GI: above Pulm: No SOB Interactive and getting along well at home.  Otherwise, ROS is as per the HPI.     Objective:   Physical Exam   GEN: WDWN, NAD, Non-toxic, A & O x 3 HEENT: Atraumatic, Normocephalic. Neck supple. No masses, No LAD. Ears and Nose: No external deformity. CV: RRR, No M/G/R. No JVD. No thrill. No extra heart sounds. PULM: CTA B, no wheezes, crackles, rhonchi. No retractions. No resp. distress. No accessory muscle use. EXTR: No c/c/e NEURO Normal gait.  PSYCH: Normally interactive. Conversant. Not depressed or anxious appearing.  Calm demeanor.    *RADIOLOGY REPORT*   Clinical Data: Dysphagia.  History of cervical fusion.   ESOPHOGRAM/BARIUM SWALLOW   Technique:  Combined double contrast and single contrast examination performed using effervescent crystals, thick barium liquid, and thin barium liquid.   Fluoroscopy time:  1.7 minutes.   Comparison:  None   Findings:  Initial barium swallows demonstrate a mild deviation of the cervical esophagus rightward.  Mild impression on the posterior aspect of the cervical esophagus at the C5 level due to the anterior plate.  No upper esophageal webs, strictures diverticuli. The piriform sinus and vallecular air spaces appear normal.   Nonspecific esophageal motility disorder with occasional disruption of the primary peristaltic wave and  occasional tertiary contractions.   There is a moderate-to-large hiatal hernia with spontaneous and inducible GE reflux.  No strictures or masses.  A 13 mm barium pill passed into the stomach without difficulty.   IMPRESSION:   1.  Mild impression on the posterior aspect of the cervical esophagus by the upper aspect of the plate at C5. 2.  Nonspecific esophageal dysmotility. 3.  Moderate-to-large hiatal hernia with spontaneous and inducible GE reflux.   Original Report Authenticated By: P. Loralie Champagne, M.D.      Assessment & Plan:   1. GERD   2. Hiatal hernia     Significant GERD and large hiatal hernia on barium swallow. We will treat with twice a day Prilosec for now, and recheck his complete physical examination. The patient has a nasolaryngoscopy, and barium swallow, and these did not show any focal constriction. There was a mild impression at the posterior aspect of the posterior esophagus only.  If he continues to have significant impairing symptoms, full gastroenterology evaluation may be appropriate

## 2011-01-30 ENCOUNTER — Other Ambulatory Visit: Payer: Self-pay | Admitting: Family Medicine

## 2011-02-02 ENCOUNTER — Ambulatory Visit
Admission: RE | Admit: 2011-02-02 | Discharge: 2011-02-02 | Disposition: A | Discharge status: Home / Self Care | Payer: Self-pay | Source: Ambulatory Visit | Attending: Orthopedic Surgery | Admitting: Orthopedic Surgery

## 2011-02-02 ENCOUNTER — Other Ambulatory Visit: Payer: Self-pay | Admitting: Orthopedic Surgery

## 2011-02-02 DIAGNOSIS — Z981 Arthrodesis status: Secondary | ICD-10-CM

## 2011-02-12 ENCOUNTER — Telehealth: Payer: Self-pay | Admitting: Family Medicine

## 2011-02-12 DIAGNOSIS — E785 Hyperlipidemia, unspecified: Secondary | ICD-10-CM

## 2011-02-12 DIAGNOSIS — Z125 Encounter for screening for malignant neoplasm of prostate: Secondary | ICD-10-CM

## 2011-02-12 NOTE — Telephone Encounter (Signed)
Message copied by Excell Seltzer on Thu Feb 12, 2011  4:12 PM ------      Message from: Alvina Chou      Created: Tue Feb 10, 2011  3:54 PM      Regarding: lab orders for 02-17-11 ( Dr Copland's pt)       Patient is scheduled for CPX labs, please order future labs, Thanks , Camelia Eng

## 2011-02-17 ENCOUNTER — Other Ambulatory Visit (INDEPENDENT_AMBULATORY_CARE_PROVIDER_SITE_OTHER): Payer: BC Managed Care – PPO

## 2011-02-17 DIAGNOSIS — Z125 Encounter for screening for malignant neoplasm of prostate: Secondary | ICD-10-CM

## 2011-02-17 DIAGNOSIS — E785 Hyperlipidemia, unspecified: Secondary | ICD-10-CM

## 2011-02-17 LAB — LIPID PANEL
HDL: 47.6 mg/dL (ref >39.00)
Triglycerides: 339 mg/dL — ABNORMAL HIGH (ref 0.0–149.0)
VLDL: 67.8 mg/dL — ABNORMAL HIGH (ref 0.0–40.0)

## 2011-02-17 LAB — COMPREHENSIVE METABOLIC PANEL
AST: 23 U/L (ref 0–37)
Alkaline Phosphatase: 91 U/L (ref 39–117)
BUN: 15 mg/dL (ref 6–23)
Calcium: 9.5 mg/dL (ref 8.4–10.5)
Chloride: 105 mEq/L (ref 96–112)
Creatinine, Ser: 1.2 mg/dL (ref 0.4–1.5)
Total Bilirubin: 0.5 mg/dL (ref 0.3–1.2)

## 2011-02-17 LAB — LDL CHOLESTEROL, DIRECT: Direct LDL: 212.5 mg/dL

## 2011-02-24 ENCOUNTER — Encounter: Payer: Self-pay | Admitting: Family Medicine

## 2011-02-25 ENCOUNTER — Encounter: Payer: Self-pay | Admitting: Internal Medicine

## 2011-02-25 ENCOUNTER — Ambulatory Visit (INDEPENDENT_AMBULATORY_CARE_PROVIDER_SITE_OTHER): Payer: BC Managed Care – PPO | Admitting: Family Medicine

## 2011-02-25 ENCOUNTER — Encounter: Payer: Self-pay | Admitting: Family Medicine

## 2011-02-25 VITALS — BP 120/78 | HR 67 | Temp 97.8°F | Ht 69.0 in | Wt 191.8 lb

## 2011-02-25 DIAGNOSIS — Z1211 Encounter for screening for malignant neoplasm of colon: Secondary | ICD-10-CM

## 2011-02-25 DIAGNOSIS — E785 Hyperlipidemia, unspecified: Secondary | ICD-10-CM

## 2011-02-25 DIAGNOSIS — Z0000 Encounter for general adult medical examination without abnormal findings: Secondary | ICD-10-CM

## 2011-02-25 DIAGNOSIS — K449 Diaphragmatic hernia without obstruction or gangrene: Secondary | ICD-10-CM

## 2011-02-25 DIAGNOSIS — K219 Gastro-esophageal reflux disease without esophagitis: Secondary | ICD-10-CM

## 2011-02-25 MED ORDER — ATORVASTATIN CALCIUM 80 MG PO TABS: 80.0000 mg | ORAL_TABLET | Freq: Every day | ORAL | Status: DC

## 2011-02-25 NOTE — Progress Notes (Signed)
Patient Name: Matthew Eaton Date of Birth: 1960/11/03 Age: 51 y.o. Medical Record Number: [id number] Gender: male Date of Encounter: 02/25/2011  History of Present Illness:  Matthew Eaton is a 51 y.o. very pleasant male patient who presents with the following:  Preventative Health Maintenance Visit:  Health Maintenance Summary Reviewed and updated, unless pt declines services.  Tobacco History Reviewed. Alcohol: No concerns, no excessive use Exercise Habits:none STD concerns: no risk or activity to increase risk Drug Use: None Encouraged self-testicular check   Lipids:    Component Value Date/Time   CHOL 360* 02/17/2011 1016   TRIG 339.0* 02/17/2011 1016   HDL 47.60 02/17/2011 1016   LDLDIRECT 212.5 02/17/2011 1016   VLDL 67.8* 02/17/2011 1016   CHOLHDL 8 02/17/2011 1016    CBC:    Component Value Date/Time   WBC 7.0 01/27/2010 0923   HGB 14.5 01/27/2010 0923   HCT 43.8 01/27/2010 0923   PLT 205 01/27/2010 0923   MCV 93.8 01/27/2010 0923   NEUTROABS 4.0 01/22/2010 1116   LYMPHSABS 2.5 01/22/2010 1116   MONOABS 0.8 01/22/2010 1116   EOSABS 0.1 01/22/2010 1116   BASOSABS 0.1 01/22/2010 1116    Basic Metabolic Panel:    Component Value Date/Time   NA 141 02/17/2011 1016   K 4.2 02/17/2011 1016   CL 105 02/17/2011 1016   CO2 29 02/17/2011 1016   BUN 15 02/17/2011 1016   CREATININE 1.2 02/17/2011 1016   GLUCOSE 96 02/17/2011 1016   CALCIUM 9.5 02/17/2011 1016    Lab Results  Component Value Date   ALT 34 02/17/2011   AST 23 02/17/2011   ALKPHOS 91 02/17/2011   BILITOT 0.5 02/17/2011    Lab Results  Component Value Date   PSA 0.54 02/17/2011   PSA 1.05 11/14/2008   Trouble swallowing has resolved  GI referral  needs colonoscopy  Very chol prob:   11/2008 -- quit smoking.  Lipids: Very high. Poor diet - high in fat, meats, creamy sauces. No meds  Lipids:    Component Value Date/Time   CHOL 360* 02/17/2011 1016   TRIG 339.0* 02/17/2011 1016   HDL 47.60  02/17/2011 1016   LDLDIRECT 212.5 02/17/2011 1016   VLDL 67.8* 02/17/2011 1016   CHOLHDL 8 02/17/2011 1016    Lab Results  Component Value Date   ALT 34 02/17/2011   AST 23 02/17/2011   ALKPHOS 91 02/17/2011   BILITOT 0.5 02/17/2011      Past Medical History, Surgical History, Social History, Family History, Problem List, Medications, and Allergies have been reviewed and updated if relevant.  Review of Systems:  General: Denies fever, chills, sweats. No significant weight loss. Eyes: Denies blurring,significant itching ENT: Denies earache, sore throat, and hoarseness. Cardiovascular: Denies chest pains, palpitations, dyspnea on exertion Respiratory: Denies cough, dyspnea at rest,wheeezing Breast: no concerns about lumps GI: Denies nausea, vomiting, diarrhea, constipation, change in bowel habits, abdominal pain, melena, hematochezia GU: Denies penile discharge, ED, urinary flow / outflow problems. No STD concerns. Musculoskeletal: still significant neck and back pain. Has exterior spinal cord stimulator right now Derm: Denies rash, itching Neuro: No frequent falls, frequent headaches Psych: Denies depression, anxiety Endocrine: Denies cold intolerance, heat intolerance, polydipsia Heme: Denies enlarged lymph nodes Allergy: No hayfever   Physical Examination: Filed Vitals:   02/25/11 1046  BP: 120/78  Pulse: 67  Temp: 97.8 F (36.6 C)  TempSrc: Oral  Height: 5\' 9"  (1.753 m)  Weight: 191 lb 12.8 oz (87 kg)  SpO2: 97%    Body mass index is 28.32 kg/(m^2).   Wt Readings from Last 3 Encounters:  02/25/11 191 lb 12.8 oz (87 kg)  01/05/11 179 lb 12.8 oz (81.557 kg)  01/22/10 174 lb 1.9 oz (78.98 kg)    GEN: well developed, well nourished, no acute distress Eyes: conjunctiva and lids normal, PERRLA, EOMI ENT: TM clear, nares clear, oral exam WNL Neck: supple, no lymphadenopathy, no thyromegaly, no JVD Pulm: clear to auscultation and percussion, respiratory effort  normal CV: regular rate and rhythm, S1-S2, no murmur, rub or gallop, no bruits, peripheral pulses normal and symmetric, no cyanosis, clubbing, edema or varicosities Chest: no scars, masses GI: soft, non-tender; no hepatosplenomegaly, masses; active bowel sounds all quadrants GU: no hernia, testicular mass, penile discharge, or prostate enlargement Lymph: no cervical, axillary or inguinal adenopathy MSK: notable limited neck motion with spinal cord stimulator in place SKIN: clear, good turgor, color WNL, no rashes, lesions, or ulcerations Neuro: normal mental status, normal strength, sensation, and motion Psych: alert; oriented to person, place and time, normally interactive and not anxious or depressed in appearance.   Assessment and Plan: 1. Routine general medical examination at a health care facility    2. HYPERLIPIDEMIA  atorvastatin (LIPITOR) 80 MG tablet  3. GERD    4. Hiatal hernia    5. Special screening for malignant neoplasm of colon  Ambulatory referral to Gastroenterology    The patient's preventative maintenance and recommended screening tests for an annual wellness exam were reviewed in full today. Brought up to date unless services declined.  Counselled on the importance of diet, exercise, and its role in overall health and mortality. The patient's FH and SH was reviewed, including their home life, tobacco status, and drug and alcohol status.   Chol very high: >10 minutes spent in face to face time with patient, >50% spent in counselling or coordination of care: over and above CPX discussing this with he and his wife, diet, lifestyle changes, start high dose lipitor. Given 20 mg lipitor sample to see if he can tolerate it.  GI sx have resolved

## 2011-02-25 NOTE — Patient Instructions (Signed)
REFERRAL: GO THE THE FRONT ROOM AT THE ENTRANCE OF OUR CLINIC, NEAR CHECK IN. ASK FOR Matthew Eaton. SHE WILL HELP YOU SET UP YOUR REFERRAL. DATE: TIME:   Recheck 3-4 months to recheck chol

## 2011-03-19 ENCOUNTER — Encounter: Payer: Self-pay | Admitting: Internal Medicine

## 2011-03-19 ENCOUNTER — Ambulatory Visit (AMBULATORY_SURGERY_CENTER): Payer: BC Managed Care – PPO | Admitting: *Deleted

## 2011-03-19 VITALS — Ht 69.0 in | Wt 190.7 lb

## 2011-03-19 DIAGNOSIS — Z1211 Encounter for screening for malignant neoplasm of colon: Secondary | ICD-10-CM

## 2011-03-19 MED ORDER — PEG-KCL-NACL-NASULF-NA ASC-C 100 G PO SOLR: ORAL | Status: DC

## 2011-03-19 NOTE — Progress Notes (Signed)
Propofol sedation offered due to pt's medication list and history.  He declined

## 2011-04-02 ENCOUNTER — Ambulatory Visit (AMBULATORY_SURGERY_CENTER): Payer: BC Managed Care – PPO | Admitting: Internal Medicine

## 2011-04-02 ENCOUNTER — Encounter: Payer: Self-pay | Admitting: Internal Medicine

## 2011-04-02 DIAGNOSIS — Z1211 Encounter for screening for malignant neoplasm of colon: Secondary | ICD-10-CM

## 2011-04-02 DIAGNOSIS — K635 Polyp of colon: Secondary | ICD-10-CM

## 2011-04-02 DIAGNOSIS — D126 Benign neoplasm of colon, unspecified: Secondary | ICD-10-CM

## 2011-04-02 DIAGNOSIS — D129 Benign neoplasm of anus and anal canal: Secondary | ICD-10-CM

## 2011-04-02 DIAGNOSIS — D128 Benign neoplasm of rectum: Secondary | ICD-10-CM

## 2011-04-02 MED ORDER — SODIUM CHLORIDE 0.9 % IV SOLN: 500.0000 mL | INTRAVENOUS | Status: DC

## 2011-04-02 NOTE — Patient Instructions (Signed)
YOU HAD AN ENDOSCOPIC PROCEDURE TODAY AT THE [facility]: Refer to the procedure report that was given to you for any specific questions about what was found during the examination.  If the procedure report does not answer your questions, please call your gastroenterologist to clarify.  If you requested that your care partner not be given the details of your procedure findings, then the procedure report has been included in a sealed envelope for you to review at your convenience later.  YOU SHOULD EXPECT: Some feelings of bloating in the abdomen. Passage of more gas than usual.  Walking can help get rid of the air that was put into your GI tract during the procedure and reduce the bloating. If you had a lower endoscopy (such as a colonoscopy or flexible sigmoidoscopy) you may notice spotting of blood in your stool or on the toilet paper. If you underwent a bowel prep for your procedure, then you may not have a normal bowel movement for a few days.  DIET: Your first meal following the procedure should be a light meal and then it is ok to progress to your normal diet.  A half-sandwich or bowl of soup is an example of a good first meal.  Heavy or fried foods are harder to digest and may make you feel nauseous or bloated.  Likewise meals heavy in dairy and vegetables can cause extra gas to form and this can also increase the bloating.  Drink plenty of fluids but you should avoid alcoholic beverages for 24 hours.  ACTIVITY: Your care partner should take you home directly after the procedure.  You should plan to take it easy, moving slowly for the rest of the day.  You can resume normal activity the day after the procedure however you should NOT DRIVE or use heavy machinery for 24 hours (because of the sedation medicines used during the test).    SYMPTOMS TO REPORT IMMEDIATELY: A gastroenterologist can be reached at any hour.  During normal business hours, 8:30 AM to 5:00 PM Monday through Friday,  call [phone].  After hours and on weekends, please call the GI answering service at [phone] who will take a message and have the physician on call contact you.   Following lower endoscopy (colonoscopy or flexible sigmoidoscopy):  Excessive amounts of blood in the stool  Significant tenderness or worsening of abdominal pains  Swelling of the abdomen that is new, acute  Fever of 100F or higher    FOLLOW UP: If any biopsies were taken you will be contacted by phone or by letter within the next 1-3 weeks.  Call your gastroenterologist if you have not heard about the biopsies in 3 weeks.  Our staff will call the home number listed on your records the next business day following your procedure to check on you and address any questions or concerns that you may have at that time regarding the information given to you following your procedure. This is a courtesy call and so if there is no answer at the home number and we have not heard from you through the emergency physician on call, we will assume that you have returned to your regular daily activities without incident.  SIGNATURES/CONFIDENTIALITY: You and/or your care partner have signed paperwork which will be entered into your electronic medical record.  These signatures attest to the fact that that the information above on your After Visit Summary has been reviewed and is understood.  Full responsibility of the confidentiality   of this discharge information lies with you and/or your care-partner.     

## 2011-04-02 NOTE — Op Note (Signed)
[facility] [address] [place], [place]  [address]  COLONOSCOPY PROCEDURE REPORT  PATIENT:  Matthew Eaton, Matthew Eaton  MR#:  [id number] BIRTHDATE:  [birth year], 50 yrs. old  GENDER:  male ENDOSCOPIST:  Carie Caddy. Elzie Knisley, MD REF. BY:  Karleen Hampshire T. Copland, M.D. PROCEDURE DATE:  04/02/2011 PROCEDURE:  Colonoscopy with snare polypectomy, Colon with cold biopsy polypectomy ASA CLASS:  Class II INDICATIONS:  Routine Risk Screening, 1st colonoscopy MEDICATIONS:   These medications were titrated to patient response per physician's verbal order, Versed 7 mg IV, Fentanyl 100 mcg IV  DESCRIPTION OF PROCEDURE:   After the risks benefits and alternatives of the procedure were thoroughly explained, informed consent was obtained.  Digital rectal exam was performed and revealed no rectal masses.   The LB 180AL [id number] endoscope was introduced through the anus and advanced to the terminal ileum which was intubated for a short distance, without limitations. The quality of the prep was good, using MoviPrep.  The instrument was then slowly withdrawn as the colon was fully examined. <<PROCEDUREIMAGES>> FINDINGS:  The terminal ileum appeared normal.  A 1 cm pedunculated polyp was found in the sigmoid colon. Polyp was snared, then cauterized with monopolar cautery. Retrieval was successful.  A 2 mm sessile polyp was found in the rectum on retroflexion. The polyp was removed using cold biopsy forceps. Mild diverticulosis was found in the cecum (1) and left colon. Retroflexed views in the rectum revealed no other findings other than those already described.   The scope was then withdrawn from the cecum and the procedure completed. COMPLICATIONS:  None  ENDOSCOPIC IMPRESSION: 1) Normal terminal ileum 2) Pedunculated polyp in the sigmoid colon. Removed and sent to pathology. 3) Sessile polyp in the rectum. Removed and sent to pathology. 4) Mild diverticulosis in the left colon  RECOMMENDATIONS: 1) Hold  aspirin, aspirin products, and anti-inflammatory medication for 1 week. 2) Await pathology results 3) If the polyps removed today are proven to be adenomatous (pre-cancerous) polyps, you will need a repeat colonoscopy in 5 years. Otherwise you should continue to follow colorectal cancer screening guidelines for "routine risk" patients with colonoscopy in 10 years. You will receive a letter within 1-2 weeks with the results of your biopsy as well as final recommendations. Please call my office if you have not received a letter after 3 weeks.  Carie Caddy. Rhea Belton, MD  CC:  Juleen China, MD The Patient  n. eSIGNEDCarie Caddy. Marianela Mandrell at 04/02/2011 10:40 AM  Barnabas Harries, [phone]

## 2011-04-03 ENCOUNTER — Telehealth: Payer: Self-pay | Admitting: *Deleted

## 2011-04-03 NOTE — Telephone Encounter (Signed)
  Follow up Call-  Call back number 04/02/2011  Post procedure Call Back phone  # [phone]  Permission to leave phone message Yes     Patient questions:  Do you have a fever, pain , or abdominal swelling? no Pain Score  0 *  Have you tolerated food without any problems? yes  Have you been able to return to your normal activities? yes  Do you have any questions about your discharge instructions: Diet   no Medications  no Follow up visit  no  Do you have questions or concerns about your Care? no  Actions: * If pain score is 4 or above: No action needed, pain <4.

## 2011-04-08 ENCOUNTER — Encounter: Payer: Self-pay | Admitting: Internal Medicine

## 2011-05-15 ENCOUNTER — Telehealth: Payer: Self-pay | Admitting: Family Medicine

## 2011-05-15 NOTE — Telephone Encounter (Signed)
Caller: Kimberly/Spouse; PCP: Hannah Beat T.; CB#: [phone]. Call regarding Blood Pressure Issues (132/96) today in the Surgeons Office. Caller reports this gentleman was seen in office of Surgeon who did his back surgery in 2009 and 2012. 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 Feq headaches that Surgeon feels are r/t long use of Stimulator. MD has advised him to redue the wearing time of the stimulator and monitor BP at least daily for the next 3 weeks. They were advised to call office of PCP and advise re elevated BP today. He is resting at present. Caller asked to check BP when he gets up and this RN will callback at 15:30 to get reading. She is agreeable. 15:30 follow up call to [facility] who reports BP readings are now 93/64 and 96/60. Normal readings for this gentleman who now denies pain. Caller advised to record BP readings as directed and to calback prn.

## 2011-05-28 ENCOUNTER — Other Ambulatory Visit: Payer: Self-pay | Admitting: Family Medicine

## 2011-05-28 DIAGNOSIS — E785 Hyperlipidemia, unspecified: Secondary | ICD-10-CM

## 2011-05-28 DIAGNOSIS — Z79899 Other long term (current) drug therapy: Secondary | ICD-10-CM

## 2011-06-01 ENCOUNTER — Other Ambulatory Visit (INDEPENDENT_AMBULATORY_CARE_PROVIDER_SITE_OTHER): Payer: BC Managed Care – PPO

## 2011-06-01 DIAGNOSIS — E785 Hyperlipidemia, unspecified: Secondary | ICD-10-CM

## 2011-06-01 DIAGNOSIS — E782 Mixed hyperlipidemia: Secondary | ICD-10-CM

## 2011-06-01 DIAGNOSIS — Z79899 Other long term (current) drug therapy: Secondary | ICD-10-CM

## 2011-06-01 LAB — HEPATIC FUNCTION PANEL
ALT: 55 U/L — ABNORMAL HIGH (ref 0–53)
AST: 33 U/L (ref 0–37)
Albumin: 3.8 g/dL (ref 3.5–5.2)
Total Protein: 6.8 g/dL (ref 6.0–8.3)

## 2011-06-01 LAB — LIPID PANEL
Cholesterol: 202 mg/dL — ABNORMAL HIGH (ref 0–200)
Triglycerides: 308 mg/dL — ABNORMAL HIGH (ref 0.0–149.0)

## 2011-06-01 LAB — LDL CHOLESTEROL, DIRECT: Direct LDL: 111.9 mg/dL

## 2011-06-02 ENCOUNTER — Encounter: Payer: Self-pay | Admitting: *Deleted

## 2011-10-12 ENCOUNTER — Other Ambulatory Visit (INDEPENDENT_AMBULATORY_CARE_PROVIDER_SITE_OTHER): Payer: BC Managed Care – PPO

## 2011-10-12 DIAGNOSIS — Z79899 Other long term (current) drug therapy: Secondary | ICD-10-CM

## 2011-10-12 DIAGNOSIS — E7800 Pure hypercholesterolemia, unspecified: Secondary | ICD-10-CM

## 2011-10-12 DIAGNOSIS — Z125 Encounter for screening for malignant neoplasm of prostate: Secondary | ICD-10-CM

## 2011-10-12 LAB — COMPREHENSIVE METABOLIC PANEL
AST: 37 U/L (ref 0–37)
Albumin: 3.7 g/dL (ref 3.5–5.2)
BUN: 13 mg/dL (ref 6–23)
Calcium: 9.2 mg/dL (ref 8.4–10.5)
Chloride: 107 mEq/L (ref 96–112)
Glucose, Bld: 100 mg/dL — ABNORMAL HIGH (ref 70–99)
Potassium: 4.1 mEq/L (ref 3.5–5.1)

## 2011-10-12 LAB — CBC WITH DIFFERENTIAL/PLATELET
Basophils Absolute: 0 10*3/uL (ref 0.0–0.1)
Basophils Relative: 0.4 % (ref 0.0–3.0)
Eosinophils Absolute: 0.1 10*3/uL (ref 0.0–0.7)
Hemoglobin: 13.5 g/dL (ref 13.0–17.0)
Lymphs Abs: 2.5 10*3/uL (ref 0.7–4.0)
MCHC: 32.6 g/dL (ref 30.0–36.0)
MCV: 92.4 fl (ref 78.0–100.0)
Monocytes Absolute: 0.6 10*3/uL (ref 0.1–1.0)
Neutro Abs: 3.6 10*3/uL (ref 1.4–7.7)
RBC: 4.47 Mil/uL (ref 4.22–5.81)
RDW: 14.4 % (ref 11.5–14.6)

## 2011-10-14 ENCOUNTER — Encounter: Payer: Self-pay | Admitting: *Deleted

## 2011-11-04 ENCOUNTER — Other Ambulatory Visit: Payer: Self-pay | Admitting: Family Medicine

## 2011-11-04 ENCOUNTER — Other Ambulatory Visit: Payer: Self-pay

## 2011-11-04 NOTE — Telephone Encounter (Signed)
pts wife request refill for citalopram; advised filled earlier today; pt will ck with pharmacy.

## 2011-11-04 NOTE — Telephone Encounter (Signed)
[facility] [address] request refill celexa # 30 x 3.

## 2012-01-07 ENCOUNTER — Encounter: Payer: Self-pay | Admitting: Family Medicine

## 2012-01-07 ENCOUNTER — Encounter: Payer: Self-pay | Admitting: *Deleted

## 2012-01-07 ENCOUNTER — Ambulatory Visit (INDEPENDENT_AMBULATORY_CARE_PROVIDER_SITE_OTHER): Payer: BC Managed Care – PPO | Admitting: Family Medicine

## 2012-01-07 VITALS — BP 120/84 | HR 88 | Temp 98.2°F | Ht 69.0 in | Wt 200.8 lb

## 2012-01-07 DIAGNOSIS — R0989 Other specified symptoms and signs involving the circulatory and respiratory systems: Secondary | ICD-10-CM

## 2012-01-07 DIAGNOSIS — R0600 Dyspnea, unspecified: Secondary | ICD-10-CM

## 2012-01-07 DIAGNOSIS — R0609 Other forms of dyspnea: Secondary | ICD-10-CM

## 2012-01-07 DIAGNOSIS — R609 Edema, unspecified: Secondary | ICD-10-CM

## 2012-01-07 LAB — BASIC METABOLIC PANEL: GFR: 69.12 mL/min (ref >60.00)

## 2012-01-07 NOTE — Progress Notes (Signed)
[facility] at [facility] [address] [place] [address] Phone: [phone] Fax: [phone]  Date:  01/07/2012   Name:  Matthew Eaton   DOB:  1960-04-10   MRN:  [id number] Gender: male Age: 51 y.o.  PCP:  Hannah Beat, MD  Evaluating MD: Hannah Beat, MD   Chief Complaint: Leg Swelling   History of Present Illness:  Matthew Eaton is a 51 y.o. pleasant patient who presents with the following:  Right leg > Left, both a little bit sweollen. Has been ongoing for ? Time. Likely weeks to months. No chest pain. No trauma or bruising. No posterior leg pain. O/w generally feeling well  Patient Active Problem List  Diagnosis  . HYPOGONADISM  . HYPERLIPIDEMIA  . ANXIETY STATE, UNSPECIFIED  . GERD  . FATIGUE  . DISC DISEASE, CERVICAL  . TOBACCO ABUSE, HX OF  . Hiatal hernia    Past Medical History  Diagnosis Date  . Neck pain     cervical spine with herniation  . HLD (hyperlipidemia)   . Tobacco abuse   . GERD (gastroesophageal reflux disease)   . Wrist fracture, left   . Hiatal hernia     Past Surgical History  Procedure Date  . Neck surgery     x2- 2010, 2011    History  Substance Use Topics  . Smoking status: Former Smoker -- 30 years  . Smokeless tobacco: Never Used  . Alcohol Use: No    Family History  Problem Relation Age of Onset  . Colon cancer Neg Hx   . Esophageal cancer Neg Hx   . Rectal cancer Neg Hx   . Stomach cancer Neg Hx     Allergies  Allergen Reactions  . Methocarbamol Shortness Of Breath  . Gabapentin     Medication list has been reviewed and updated.  Outpatient Prescriptions Prior to Visit  Medication Sig Dispense Refill  . aspirin 81 MG tablet Take 81 mg by mouth daily.        Marland Kitchen atorvastatin (LIPITOR) 80 MG tablet Take 1 tablet (80 mg total) by mouth daily.  90 tablet  3  . oxyCODONE-acetaminophen (PERCOCET) 5-325 MG per tablet Take 1 tablet by mouth every 8 (eight) hours as needed.        .  citalopram (CELEXA) 20 MG tablet TAKE ONE TABLET BY MOUTH EVERY DAY  30 tablet  3  . diclofenac (FLECTOR) 1.3 % PTCH Place 1 patch onto the skin 2 (two) times daily.        Marland Kitchen gabapentin (NEURONTIN) 600 MG tablet Take 600 mg by mouth 3 (three) times daily.        Marland Kitchen ibuprofen (ADVIL,MOTRIN) 200 MG tablet Take 200 mg by mouth every 6 (six) hours as needed.        Marland Kitchen omeprazole (PRILOSEC) 20 MG capsule Take 1 capsule (20 mg total) by mouth 2 (two) times daily.  60 capsule  11  . Testosterone 1.25 GM/ACT (1%) GEL Place onto the skin. 4 pumps daily       Last reviewed on 04/02/2011 10:40 AM by Beverley Fiedler, MD  Review of Systems:   GEN: No acute illnesses, no fevers, chills. GI: No n/v/d, eating normally Pulm: No SOB Interactive and getting along well at home.  Otherwise, ROS is as per the HPI.   Physical Examination: Filed Vitals:   01/07/12 1415  BP: 120/84  Pulse: 88  Temp: 98.2 F (36.8 C)  TempSrc: Oral  Height: 5\' 9"  (1.753 m)  Weight: 200 lb 12.8 oz (91.082 kg)  SpO2: 95%    Body mass index is 29.65 kg/(m^2). Ideal Body Weight: Weight in (lb) to have BMI = 25: 168.9    GEN: WDWN, NAD, Non-toxic, A & O x 3 HEENT: Atraumatic, Normocephalic. Neck supple. No masses, No LAD. Ears and Nose: No external deformity. CV: RRR, No M/G/R. No JVD. No thrill. No extra heart sounds. PULM: CTA B, no wheezes, crackles, rhonchi. No retractions. No resp. distress. No accessory muscle use. EXTR: 1+ edema on r, tr on L NEURO Normal gait.  PSYCH: Normally interactive. Conversant. Not depressed or anxious appearing.  Calm demeanor.    Assessment and Plan:  1. Edema  Brain natriuretic peptide, Basic metabolic panel  2. Dyspnea  Brain natriuretic peptide, Basic metabolic panel   More likely from venous dysfunction, some varicosities. Prop legs up and can use compression hose  Lab Results  Component Value Date   ALT 56* 10/12/2011   AST 37 10/12/2011   ALKPHOS 94 10/12/2011   BILITOT 0.0*  10/12/2011     Results for orders placed in visit on 01/07/12  BRAIN NATRIURETIC PEPTIDE      Component Value Range   Pro B Natriuretic peptide (BNP) 25.0  0.0 - 100.0 pg/mL  BASIC METABOLIC PANEL      Component Value Range   Sodium 140  135 - 145 mEq/L   Potassium 3.5  3.5 - 5.1 mEq/L   Chloride 105  96 - 112 mEq/L   CO2 28  19 - 32 mEq/L   Glucose, Bld 96  70 - 99 mg/dL   BUN 12  6 - 23 mg/dL   Creatinine, Ser 1.2  0.4 - 1.5 mg/dL   Calcium 9.3  8.4 - 16.1 mg/dL   GFR 09.60  >45.40 mL/min     Orders Today:  Orders Placed This Encounter  Procedures  . Brain natriuretic peptide  . Basic metabolic panel    Updated Medication List: (Includes new medications, updates to list, dose adjustments) Meds ordered this encounter  Medications  . NONFORMULARY OR COMPOUNDED ITEM    Sig: Pentox/keta/bupi/diclo/doxe/gaba/orphen    Medications Discontinued: Medications Discontinued During This Encounter  Medication Reason  . diclofenac (FLECTOR) 1.3 % PTCH   . gabapentin (NEURONTIN) 600 MG tablet Error  . ibuprofen (ADVIL,MOTRIN) 200 MG tablet Error  . omeprazole (PRILOSEC) 20 MG capsule Error  . Testosterone 1.25 GM/ACT (1%) GEL      Hannah Beat, MD

## 2012-02-12 ENCOUNTER — Telehealth: Payer: Self-pay | Admitting: *Deleted

## 2012-02-12 MED ORDER — CITALOPRAM HYDROBROMIDE 40 MG PO TABS: 40.0000 mg | ORAL_TABLET | Freq: Every day | ORAL | Status: DC

## 2012-02-12 NOTE — Telephone Encounter (Signed)
That's probably a good idea, and I want to see him back in 1 month from now to make sure ok.  Increase to Citalopram 40 mg, 1 po daily, #30, 5 refills

## 2012-02-12 NOTE — Telephone Encounter (Signed)
Pt's wife states pt is feeling very edgy, doesn't think citalopram is helping anymore.  She's asking if he needs to increase his dose.  Please advise.

## 2012-02-12 NOTE — Telephone Encounter (Signed)
rx sent to pharmacy and patient will call back for appt

## 2012-03-14 ENCOUNTER — Other Ambulatory Visit: Payer: Self-pay | Admitting: Family Medicine

## 2012-05-05 ENCOUNTER — Other Ambulatory Visit: Payer: Self-pay

## 2012-05-11 ENCOUNTER — Other Ambulatory Visit (INDEPENDENT_AMBULATORY_CARE_PROVIDER_SITE_OTHER): Payer: BC Managed Care – PPO

## 2012-05-11 ENCOUNTER — Other Ambulatory Visit: Payer: Self-pay | Admitting: *Deleted

## 2012-05-11 DIAGNOSIS — Z79899 Other long term (current) drug therapy: Secondary | ICD-10-CM

## 2012-05-11 DIAGNOSIS — E785 Hyperlipidemia, unspecified: Secondary | ICD-10-CM

## 2012-05-11 LAB — CBC WITH DIFFERENTIAL/PLATELET
Basophils Absolute: 0 10*3/uL (ref 0.0–0.1)
Eosinophils Relative: 2.2 % (ref 0.0–5.0)
HCT: 43.3 % (ref 39.0–52.0)
Hemoglobin: 14.4 g/dL (ref 13.0–17.0)
Lymphs Abs: 3.3 10*3/uL (ref 0.7–4.0)
Monocytes Relative: 7.6 % (ref 3.0–12.0)
Neutro Abs: 4.4 10*3/uL (ref 1.4–7.7)
RDW: 13.7 % (ref 11.5–14.6)

## 2012-05-11 LAB — HEPATIC FUNCTION PANEL
Albumin: 3.7 g/dL (ref 3.5–5.2)
Alkaline Phosphatase: 114 U/L (ref 39–117)

## 2012-05-11 LAB — LIPID PANEL
Cholesterol: 221 mg/dL — ABNORMAL HIGH (ref 0–200)
Total CHOL/HDL Ratio: 5
VLDL: 73.4 mg/dL — ABNORMAL HIGH (ref 0.0–40.0)

## 2012-05-11 LAB — BASIC METABOLIC PANEL
BUN: 12 mg/dL (ref 6–23)
CO2: 30 mEq/L (ref 19–32)
Calcium: 9.4 mg/dL (ref 8.4–10.5)
Creatinine, Ser: 1.3 mg/dL (ref 0.4–1.5)
Glucose, Bld: 92 mg/dL (ref 70–99)

## 2012-05-12 ENCOUNTER — Ambulatory Visit: Payer: BC Managed Care – PPO

## 2012-05-12 DIAGNOSIS — Z125 Encounter for screening for malignant neoplasm of prostate: Secondary | ICD-10-CM

## 2012-05-19 ENCOUNTER — Ambulatory Visit (INDEPENDENT_AMBULATORY_CARE_PROVIDER_SITE_OTHER): Payer: BC Managed Care – PPO | Admitting: Family Medicine

## 2012-05-19 ENCOUNTER — Encounter: Payer: Self-pay | Admitting: Family Medicine

## 2012-05-19 VITALS — BP 128/90 | HR 74 | Temp 98.2°F | Ht 69.0 in | Wt 202.0 lb

## 2012-05-19 DIAGNOSIS — G4700 Insomnia, unspecified: Secondary | ICD-10-CM

## 2012-05-19 DIAGNOSIS — F411 Generalized anxiety disorder: Secondary | ICD-10-CM

## 2012-05-19 DIAGNOSIS — G8929 Other chronic pain: Secondary | ICD-10-CM

## 2012-05-19 DIAGNOSIS — M5030 Other cervical disc degeneration, unspecified cervical region: Secondary | ICD-10-CM

## 2012-05-19 DIAGNOSIS — M542 Cervicalgia: Secondary | ICD-10-CM

## 2012-05-19 DIAGNOSIS — Z0000 Encounter for general adult medical examination without abnormal findings: Secondary | ICD-10-CM

## 2012-05-19 MED ORDER — AMITRIPTYLINE HCL 25 MG PO TABS: 25.0000 mg | ORAL_TABLET | Freq: Every day | ORAL | Status: DC

## 2012-05-19 MED ORDER — TIZANIDINE HCL 4 MG PO TABS: 4.0000 mg | ORAL_TABLET | Freq: Every evening | ORAL | Status: DC

## 2012-05-19 NOTE — Progress Notes (Signed)
[facility] at [facility] [address] [place] [address] Phone: [phone] Fax: [phone]  Date:  05/19/2012   Name:  Matthew Eaton   DOB:  1960-04-04   MRN:  [id number] Gender: male Age: 52 y.o.  Primary Physician:  Hannah Beat, MD  Evaluating MD: Hannah Beat, MD   Chief Complaint: Annual Exam   History of Present Illness:  Matthew Eaton is a 52 y.o. pleasant patient who presents with the following:  CPX and ongoing issues:  Chronic neck pain. Left arm always feels tingling and on fire. He is status post 2 different neck operations, and he is currently seeing Dr. Eduard Clos who has been working well with him for the management of his chronic neck pain. He's also had some epidural steroid injections. He is no longer taking any muscle relaxants, and he wonders if this will help.  He also is having  Some intermittent insomnia and he is also been little bit more anxious than normal. Previously, he has been well controlled on Celexa 40 mg. zanaflex Elavil  Preventative Health Maintenance Visit:  Health Maintenance Summary Reviewed and updated, unless pt declines services.  Tobacco History Reviewed. Alcohol: No concerns, no excessive use Exercise Habits: minimal  STD concerns: no risk or activity to increase risk Drug Use: None Encouraged self-testicular check  Health Maintenance  Topic Date Due  . Tetanus/tdap  10/30/1979  . Influenza Vaccine  10/03/2012  . Colonoscopy  04/01/2021    Labs reviewed with the patient.  Results for orders placed in visit on 05/12/12  PSA      Result Value Range   PSA 0.53  0.10 - 4.00 ng/mL   CBC:    Component Value Date/Time   WBC 8.6 05/11/2012 1400   HGB 14.4 05/11/2012 1400   HCT 43.3 05/11/2012 1400   PLT 257.0 05/11/2012 1400   MCV 90.1 05/11/2012 1400   NEUTROABS 4.4 05/11/2012 1400   LYMPHSABS 3.3 05/11/2012 1400   MONOABS 0.7 05/11/2012 1400   EOSABS 0.2 05/11/2012 1400   BASOSABS 0.0 05/11/2012 1400    Lipids:    Component Value Date/Time   CHOL 221* 05/11/2012 1400   TRIG 367.0* 05/11/2012 1400   HDL 40.60 05/11/2012 1400   LDLDIRECT 107.8 05/11/2012 1400   VLDL 73.4* 05/11/2012 1400   CHOLHDL 5 05/11/2012 1400   liver function tests  Basic Metabolic Panel:    Component Value Date/Time   NA 141 05/11/2012 1400   K 4.2 05/11/2012 1400   CL 102 05/11/2012 1400   CO2 30 05/11/2012 1400   BUN 12 05/11/2012 1400   CREATININE 1.3 05/11/2012 1400   GLUCOSE 92 05/11/2012 1400   CALCIUM 9.4 05/11/2012 1400      Patient Active Problem List  Diagnosis  . HYPOGONADISM  . HYPERLIPIDEMIA  . ANXIETY STATE, UNSPECIFIED  . GERD  . FATIGUE  . DISC DISEASE, CERVICAL  . TOBACCO ABUSE, HX OF  . Hiatal hernia    Past Medical History  Diagnosis Date  . Neck pain     cervical spine with herniation  . HLD (hyperlipidemia)   . Tobacco abuse   . GERD (gastroesophageal reflux disease)   . Wrist fracture, left   . Hiatal hernia     Past Surgical History  Procedure Laterality Date  . Neck surgery      x2- 2010, 2011    History   Social History  . Marital Status: Married    Spouse Name: N/A  Number of Children: N/A  . Years of Education: N/A   Occupational History  . Not on file.   Social History Main Topics  . Smoking status: Former Smoker -- 30 years  . Smokeless tobacco: Never Used  . Alcohol Use: No  . Drug Use: No  . Sexually Active: Not on file   Other Topics Concern  . Not on file   Social History Narrative  . No narrative on file    Family History  Problem Relation Age of Onset  . Colon cancer Neg Hx   . Esophageal cancer Neg Hx   . Rectal cancer Neg Hx   . Stomach cancer Neg Hx     Allergies  Allergen Reactions  . Methocarbamol Shortness Of Breath  . Gabapentin     Medication list has been reviewed and updated.  Outpatient Prescriptions Prior to Visit  Medication Sig Dispense Refill  . aspirin 81 MG tablet Take 81 mg by mouth daily.        Marland Kitchen atorvastatin  (LIPITOR) 80 MG tablet TAKE ONE TABLET BY MOUTH EVERY DAY  90 tablet  2  . citalopram (CELEXA) 40 MG tablet Take 1 tablet (40 mg total) by mouth daily.  30 tablet  3  . NONFORMULARY OR COMPOUNDED ITEM Pentox/keta/bupi/diclo/doxe/gaba/orphen      . oxyCODONE-acetaminophen (PERCOCET) 5-325 MG per tablet Take 1 tablet by mouth every 8 (eight) hours as needed.         No facility-administered medications prior to visit.    Review of Systems:   General: Denies fever, chills, sweats. No significant weight loss. Eyes: Denies blurring,significant itching ENT: Denies earache, sore throat, and hoarseness. Cardiovascular: Denies chest pains, palpitations, dyspnea on exertion Respiratory: Denies cough, dyspnea at rest,wheeezing Breast: no concerns about lumps GI: Denies nausea, vomiting, diarrhea, constipation, change in bowel habits, abdominal pain, melena, hematochezia GU: Denies penile discharge, ED, urinary flow / outflow problems. No STD concerns. Musculoskeletal: as above Derm: Denies rash, itching Neuro: as above Psych: as above Endocrine: Denies cold intolerance, heat intolerance, polydipsia Heme: Denies enlarged lymph nodes Allergy: No hayfever   Physical Examination: BP 128/90  Pulse 74  Temp(Src) 98.2 F (36.8 C) (Tympanic)  Ht 5\' 9"  (1.753 m)  Wt 202 lb (91.627 kg)  BMI 29.82 kg/m2  SpO2 97%  Ideal Body Weight: Weight in (lb) to have BMI = 25: 168.9   Wt Readings from Last 3 Encounters:  05/19/12 202 lb (91.627 kg)  01/07/12 200 lb 12.8 oz (91.082 kg)  04/02/11 190 lb (86.183 kg)    GEN: well developed, well nourished, no acute distress Eyes: conjunctiva and lids normal, PERRLA, EOMI ENT: TM clear, nares clear, oral exam WNL Neck: supple, no lymphadenopathy, no thyromegaly, no JVD Pulm: clear to auscultation and percussion, respiratory effort normal CV: regular rate and rhythm, S1-S2, no murmur, rub or gallop, no bruits, peripheral pulses normal and symmetric, no  cyanosis, clubbing, edema or varicosities Chest: no scars, masses GI: soft, non-tender; no hepatosplenomegaly, masses; active bowel sounds all quadrants GU: no hernia, testicular mass, penile discharge, or prostate enlargement Lymph: no cervical, axillary or inguinal adenopathy MSK: gait normal, muscle tone and strength WNL, no joint swelling, effusions, discoloration, crepitus  SKIN: clear, good turgor, color WNL, no rashes, lesions, or ulcerations Neuro: normal mental status, normal strength, sensation, and motion Psych: alert; oriented to person, place and time, normally interactive and not anxious or depressed in appearance.  Assessment and Plan:  Routine general medical examination at a health  care facility  DISC DISEASE, CERVICAL  Anxiety state, unspecified  Insomnia  Chronic neck pain  The patient's preventative maintenance and recommended screening tests for an annual wellness exam were reviewed in full today. Brought up to date unless services declined.  Counselled on the importance of diet, exercise, and its role in overall health and mortality. The patient's FH and SH was reviewed, including their home life, tobacco status, and drug and alcohol status.    Anxiety: Continue Celexa 40 mg, and add Elavil 25 mg at night, which will also likely improve the patient's insomnia. If he continues to have symptoms, and we did consider it in creasing this to 50 mg at night.  I am also going to add Zanaflex at night time for his chronic neck pain. It is also possible that his Elavil may help with some of the neuropathic symptoms that he is having from his neck.  Orders Today:  No orders of the defined types were placed in this encounter.    Updated Medication List: (Includes new medications, updates to list, dose adjustments) Meds ordered this encounter  Medications  . oxyCODONE-acetaminophen (PERCOCET) 10-325 MG per tablet    Sig: Take 1 tablet by mouth every 6 (six) hours as  needed for pain (max of 4 per day).  . Omeprazole Magnesium 20.6 (20 BASE) MG CPDR    Sig: Take 1-2 tablets by mouth daily.  Marland Kitchen tiZANidine (ZANAFLEX) 4 MG tablet    Sig: Take 1 tablet (4 mg total) by mouth Nightly.    Dispense:  30 tablet    Refill:  5  . amitriptyline (ELAVIL) 25 MG tablet    Sig: Take 1 tablet (25 mg total) by mouth at bedtime.    Dispense:  30 tablet    Refill:  5    Medications Discontinued: Medications Discontinued During This Encounter  Medication Reason  . oxyCODONE-acetaminophen (PERCOCET) 5-325 MG per tablet Change in therapy      Signed, Darlisha Kelm T. Nafis Farnan, MD 05/19/2012 8:53 AM

## 2012-05-20 ENCOUNTER — Encounter: Payer: Self-pay | Admitting: Family Medicine

## 2012-05-20 DIAGNOSIS — G4700 Insomnia, unspecified: Secondary | ICD-10-CM | POA: Insufficient documentation

## 2012-05-20 DIAGNOSIS — G8929 Other chronic pain: Secondary | ICD-10-CM | POA: Insufficient documentation

## 2012-05-20 HISTORY — DX: Other chronic pain: G89.29

## 2012-06-10 ENCOUNTER — Other Ambulatory Visit: Payer: Self-pay | Admitting: Family Medicine

## 2012-07-05 ENCOUNTER — Telehealth: Payer: Self-pay | Admitting: *Deleted

## 2012-07-05 NOTE — Telephone Encounter (Signed)
Patient having jerking spells and tingling in left arm and breaking out in sweats. Patient wife wants to know if you think this is coming front he new medication you put him on or if you this it is coming from surgery on his neck. Patient hasn't had jerking spells since before last surgery until now.   [phone]

## 2012-07-06 NOTE — Telephone Encounter (Signed)
Patient's wife advised

## 2012-07-06 NOTE — Telephone Encounter (Signed)
Call  It theoretically could be coming from either of the 2 new meds. (not common, but could happen)  Stop amitryptiline and tizanadine and see response over the next few days. If does not go away, we should see early next week

## 2012-07-20 ENCOUNTER — Other Ambulatory Visit: Payer: Self-pay | Admitting: Family Medicine

## 2012-10-21 ENCOUNTER — Other Ambulatory Visit: Payer: Self-pay | Admitting: Family Medicine

## 2012-12-07 ENCOUNTER — Other Ambulatory Visit: Payer: Self-pay | Admitting: Family Medicine

## 2013-01-18 ENCOUNTER — Other Ambulatory Visit: Payer: Self-pay | Admitting: Family Medicine

## 2013-01-18 NOTE — Telephone Encounter (Signed)
Last office visit 05/19/2012.  Ok to refill? 

## 2013-03-15 ENCOUNTER — Other Ambulatory Visit: Payer: Self-pay

## 2013-03-15 MED ORDER — ATORVASTATIN CALCIUM 80 MG PO TABS: 80.0000 mg | ORAL_TABLET | Freq: Every day | ORAL | Status: DC

## 2013-05-09 ENCOUNTER — Other Ambulatory Visit: Payer: Self-pay | Admitting: Family Medicine

## 2013-05-09 DIAGNOSIS — Z125 Encounter for screening for malignant neoplasm of prostate: Secondary | ICD-10-CM

## 2013-05-09 DIAGNOSIS — E785 Hyperlipidemia, unspecified: Secondary | ICD-10-CM

## 2013-05-09 DIAGNOSIS — Z79899 Other long term (current) drug therapy: Secondary | ICD-10-CM

## 2013-05-18 ENCOUNTER — Other Ambulatory Visit (INDEPENDENT_AMBULATORY_CARE_PROVIDER_SITE_OTHER): Payer: BC Managed Care – PPO

## 2013-05-18 DIAGNOSIS — E785 Hyperlipidemia, unspecified: Secondary | ICD-10-CM

## 2013-05-18 DIAGNOSIS — Z79899 Other long term (current) drug therapy: Secondary | ICD-10-CM

## 2013-05-18 DIAGNOSIS — Z125 Encounter for screening for malignant neoplasm of prostate: Secondary | ICD-10-CM

## 2013-05-18 LAB — HEPATIC FUNCTION PANEL
ALBUMIN: 3.6 g/dL (ref 3.5–5.2)
ALT: 74 U/L — AB (ref 0–53)
AST: 53 U/L — ABNORMAL HIGH (ref 0–37)
Alkaline Phosphatase: 92 U/L (ref 39–117)
Bilirubin, Direct: 0.1 mg/dL (ref 0.0–0.3)
Total Bilirubin: 0.7 mg/dL (ref 0.3–1.2)
Total Protein: 6.6 g/dL (ref 6.0–8.3)

## 2013-05-18 LAB — CBC WITH DIFFERENTIAL/PLATELET
Basophils Absolute: 0.1 10*3/uL (ref 0.0–0.1)
Basophils Relative: 0.8 % (ref 0.0–3.0)
EOS PCT: 3.3 % (ref 0.0–5.0)
Eosinophils Absolute: 0.3 10*3/uL (ref 0.0–0.7)
HCT: 40.8 % (ref 39.0–52.0)
Hemoglobin: 13.6 g/dL (ref 13.0–17.0)
LYMPHS PCT: 34.4 % (ref 12.0–46.0)
Lymphs Abs: 2.8 10*3/uL (ref 0.7–4.0)
MCHC: 33.3 g/dL (ref 30.0–36.0)
MCV: 90.5 fl (ref 78.0–100.0)
MONOS PCT: 7 % (ref 3.0–12.0)
Monocytes Absolute: 0.6 10*3/uL (ref 0.1–1.0)
NEUTROS PCT: 54.5 % (ref 43.0–77.0)
Neutro Abs: 4.4 10*3/uL (ref 1.4–7.7)
PLATELETS: 215 10*3/uL (ref 150.0–400.0)
RBC: 4.51 Mil/uL (ref 4.22–5.81)
RDW: 14.1 % (ref 11.5–14.6)
WBC: 8.1 10*3/uL (ref 4.5–10.5)

## 2013-05-18 LAB — LIPID PANEL
Cholesterol: 189 mg/dL (ref 0–200)
HDL: 43.3 mg/dL (ref >39.00)
LDL Cholesterol: 95 mg/dL (ref 0–99)
TRIGLYCERIDES: 252 mg/dL — AB (ref 0.0–149.0)
Total CHOL/HDL Ratio: 4
VLDL: 50.4 mg/dL — ABNORMAL HIGH (ref 0.0–40.0)

## 2013-05-18 LAB — BASIC METABOLIC PANEL
BUN: 10 mg/dL (ref 6–23)
CHLORIDE: 103 meq/L (ref 96–112)
CO2: 31 meq/L (ref 19–32)
Calcium: 9.4 mg/dL (ref 8.4–10.5)
Creatinine, Ser: 1.2 mg/dL (ref 0.4–1.5)
GFR: 70.12 mL/min (ref >60.00)
GLUCOSE: 101 mg/dL — AB (ref 70–99)
POTASSIUM: 3.7 meq/L (ref 3.5–5.1)
SODIUM: 140 meq/L (ref 135–145)

## 2013-05-18 LAB — PSA: PSA: 0.56 ng/mL (ref 0.10–4.00)

## 2013-05-22 ENCOUNTER — Ambulatory Visit (INDEPENDENT_AMBULATORY_CARE_PROVIDER_SITE_OTHER): Payer: BC Managed Care – PPO | Admitting: Family Medicine

## 2013-05-22 ENCOUNTER — Encounter: Payer: Self-pay | Admitting: Family Medicine

## 2013-05-22 VITALS — BP 100/80 | HR 87 | Temp 98.1°F | Ht 68.5 in | Wt 205.2 lb

## 2013-05-22 DIAGNOSIS — Z820 Family history of epilepsy and other diseases of the nervous system: Secondary | ICD-10-CM

## 2013-05-22 DIAGNOSIS — Z0000 Encounter for general adult medical examination without abnormal findings: Secondary | ICD-10-CM

## 2013-05-22 DIAGNOSIS — F331 Major depressive disorder, recurrent, moderate: Secondary | ICD-10-CM

## 2013-05-22 DIAGNOSIS — E669 Obesity, unspecified: Secondary | ICD-10-CM | POA: Insufficient documentation

## 2013-05-22 DIAGNOSIS — F411 Generalized anxiety disorder: Secondary | ICD-10-CM

## 2013-05-22 MED ORDER — BUPROPION HCL ER (XL) 150 MG PO TB24: 150.0000 mg | ORAL_TABLET | Freq: Every day | ORAL | Status: DC

## 2013-05-22 NOTE — Progress Notes (Signed)
Pre visit review using our clinic review tool, if applicable. No additional management support is needed unless otherwise documented below in the visit note. 

## 2013-05-22 NOTE — Progress Notes (Signed)
[name] [address] Phone: [phone] Fax: [phone]  Patient ID: Matthew Eaton MRN: [id number], DOB: 1960/08/21, 53 y.o. Date of Encounter: 05/22/2013  Primary Physician:  Owens Loffler, MD   Chief Complaint: Annual Exam   Subjective:   History of Present Illness:  Matthew Eaton is a 53 y.o. pleasant patient who presents with the following:  Preventative Health Maintenance Visit and some acute issues:  Health Maintenance Summary Reviewed and updated, unless pt declines services.  Tobacco History Reviewed. Alcohol: No concerns, no excessive use Exercise Habits: Some activity, rec at least 30 mins 5 times a week. Only occ now STD concerns: no risk or activity to increase risk Drug Use: None Encouraged self-testicular check  Health Maintenance  Topic Date Due  . Tetanus/tdap  10/30/1979  . Influenza Vaccine  09/02/2013  . Colonoscopy  04/01/2021    There is no immunization history for the selected administration types on file for this patient.  The patient and his wife are both worried that the patient has become more depressed recently. He does have a history of some depression anxiety, and he is on Celexa 40 mg a day. He is having some trouble sleeping. He is not suicidal or having a homicide alley. He is having decreased interest in interest in his activities. Some of this all began with his brother recently die diagnosed with ALS. His mother passed away from [place], and he also had an uncle with ALS, and this is on his mind quite a bit.  He worries that they may have a familial ALS gene that is in there family.  Patient Active Problem List   Diagnosis Date Noted  . Family hx of ALS (amyotrophic lateral sclerosis) 05/27/2013  . Major depressive disorder, recurrent episode, moderate 05/27/2013  . Obesity (BMI 30-39.9) 05/22/2013  . Insomnia 05/20/2012  . Chronic neck pain 05/20/2012  . Hiatal hernia 01/05/2011  . [place] DISEASE, CERVICAL  01/22/2010  . TOBACCO ABUSE, HX OF 01/22/2010  . Generalized anxiety disorder 03/06/2009  . HYPOGONADISM 12/19/2008  . HYPERLIPIDEMIA 11/15/2008  . GERD 11/15/2008  . FATIGUE 11/14/2008    Past Medical History  Diagnosis Date  . Neck pain     cervical spine with herniation  . HLD (hyperlipidemia)   . Tobacco abuse   . GERD (gastroesophageal reflux disease)   . Wrist fracture, left   . Hiatal hernia   . Chronic neck pain 05/20/2012  . Family hx of ALS (amyotrophic lateral sclerosis) 05/27/2013  . Major depressive disorder, recurrent episode, moderate 05/27/2013    Past Surgical History  Procedure Laterality Date  . Neck surgery      x2- 2010, 2011    History   Social History  . Marital Status: Married    Spouse Name: N/A    Number of Children: N/A  . Years of Education: N/A   Occupational History  . Not on file.   Social History Main Topics  . Smoking status: Former Smoker -- 30 years  . Smokeless tobacco: Never Used  . Alcohol Use: No  . Drug Use: No  . Sexual Activity: Yes    Partners: Female   Other Topics Concern  . Not on file   Social History Narrative  . No narrative on file    Family History  Problem Relation Age of Onset  . Colon cancer Neg Hx   . Esophageal cancer Neg Hx   . Rectal cancer Neg Hx   . Stomach  cancer Neg Hx   . ALS Mother   . ALS Brother   . ALS Maternal Uncle     Allergies  Allergen Reactions  . Methocarbamol Shortness Of Breath  . Gabapentin     Medication list has been reviewed and updated.  Review of Systems:  General: Denies fever, chills, sweats. No significant weight loss. Eyes: Denies blurring,significant itching ENT: Denies earache, sore throat, and hoarseness. Cardiovascular: Denies chest pains, palpitations, dyspnea on exertion Respiratory: Denies cough, dyspnea at rest,wheeezing Breast: no concerns about lumps GI: Denies nausea, vomiting, diarrhea, constipation, change in bowel habits, abdominal pain,  melena, hematochezia GU: Denies penile discharge, ED, urinary flow / outflow problems. No STD concerns. Musculoskeletal: Denies back pain, joint pain. The patient's neck pain is doing little bit better compared to before. Derm: Denies rash, itching Neuro: Denies  paresthesias, frequent falls, frequent headaches Psych: as above Endocrine: Denies cold intolerance, heat intolerance, polydipsia Heme: Denies enlarged lymph nodes Allergy: No hayfever  Objective:   Physical Examination: BP 100/80  Pulse 87  Temp(Src) 98.1 F (36.7 C) (Oral)  Ht 5' 8.5" (1.74 m)  Wt 205 lb 4 oz (93.101 kg)  BMI 30.75 kg/m2 Ideal Body Weight: Weight in (lb) to have BMI = 25: 166.5  GEN: well developed, well nourished, no acute distress Eyes: conjunctiva and lids normal, PERRLA, EOMI ENT: TM clear, nares clear, oral exam WNL Neck: supple, no lymphadenopathy, no thyromegaly, no JVD Pulm: clear to auscultation and percussion, respiratory effort normal CV: regular rate and rhythm, S1-S2, no murmur, rub or gallop, no bruits, peripheral pulses normal and symmetric, no cyanosis, clubbing, edema or varicosities GI: soft, non-tender; no hepatosplenomegaly, masses; active bowel sounds all quadrants GU: no hernia, testicular mass, penile discharge Lymph: no cervical, axillary or inguinal adenopathy MSK: gait normal, muscle tone and strength WNL, no joint swelling, effusions, discoloration, crepitus  SKIN: clear, good turgor, color WNL, no rashes, lesions, or ulcerations Neuro: normal mental status, normal strength, sensation, and motion Psych: patient does have a flat affect more than normal.  All labs reviewed with patient.  Lipids:    Component Value Date/Time   CHOL 189 05/18/2013 0817   TRIG 252.0* 05/18/2013 0817   HDL 43.30 05/18/2013 0817   LDLDIRECT 107.8 05/11/2012 1400   VLDL 50.4* 05/18/2013 0817   CHOLHDL 4 05/18/2013 0817    CBC:    Component Value Date/Time   WBC 8.1 05/18/2013 0817   HGB 13.6  05/18/2013 0817   HCT 40.8 05/18/2013 0817   PLT 215.0 05/18/2013 0817   MCV 90.5 05/18/2013 0817   NEUTROABS 4.4 05/18/2013 0817   LYMPHSABS 2.8 05/18/2013 0817   MONOABS 0.6 05/18/2013 0817   EOSABS 0.3 05/18/2013 0817   BASOSABS 0.1 05/18/2013 6010    Basic Metabolic Panel:    Component Value Date/Time   NA 140 05/18/2013 0817   K 3.7 05/18/2013 0817   CL 103 05/18/2013 0817   CO2 31 05/18/2013 0817   BUN 10 05/18/2013 0817   CREATININE 1.2 05/18/2013 0817   GLUCOSE 101* 05/18/2013 0817   CALCIUM 9.4 05/18/2013 0817    Lab Results  Component Value Date   ALT 74* 05/18/2013   AST 53* 05/18/2013   ALKPHOS 92 05/18/2013   BILITOT 0.7 05/18/2013    Lab Results  Component Value Date   TSH 2.04 10/12/2011    Lab Results  Component Value Date   PSA 0.56 05/18/2013   PSA 0.53 05/12/2012   PSA 0.64 10/12/2011  Assessment & Plan:   Routine general medical examination at a health care facility  Major depressive disorder, recurrent episode, moderate: >15 minutes spent in face to face time with patient, >50% spent in counselling or coordination of care: over and above health maintenance. The patient is significantly depressed, and I suspect that some of this does have to do with his recent diagnosis of his brother with ALS, and some worry about ALS. I look this up while he was in the office, and about 5-10 percent of all ALS cases are due to a familial inherited gene. I discussed the possibility of having him be evaluated by neurology or by a genetic counselor, and he is not really interested at this point.  Nevertheless he is significantly depressed compared to normal. I have started him on Wellbutrin 150 mg a day in addition to Celexa, and a followup with him in about 6 weeks.  Generalized anxiety disorder  Family hx of ALS (amyotrophic lateral sclerosis)  Health Maintenance Exam: The patient's preventative maintenance and recommended screening tests for an annual wellness exam were reviewed  in full today. Brought up to date unless services declined.  Counselled on the importance of diet, exercise, and its role in overall health and mortality. The patient's FH and SH was reviewed, including their home life, tobacco status, and drug and alcohol status.  Follow-up: Return in about 6 weeks (around 07/03/2013). Or follow-up in 1 year for complete physical examination  New Prescriptions   BUPROPION (WELLBUTRIN XL) 150 MG 24 HR TABLET    Take 1 tablet (150 mg total) by mouth daily.   No orders of the defined types were placed in this encounter.    Signed,  Maud Deed. Erin Obando, MD, [place]  Patient's Medications  New Prescriptions   BUPROPION (WELLBUTRIN XL) 150 MG 24 HR TABLET    Take 1 tablet (150 mg total) by mouth daily.  Previous Medications   ASPIRIN 81 MG TABLET    Take 81 mg by mouth daily.     ATORVASTATIN (LIPITOR) 80 MG TABLET    Take 1 tablet (80 mg total) by mouth daily at 6 PM.   CITALOPRAM (CELEXA) 40 MG TABLET    TAKE ONE TABLET BY MOUTH ONCE DAILY   NONFORMULARY OR COMPOUNDED ITEM    Pentox/keta/bupi/diclo/doxe/gaba/orphen   OMEPRAZOLE MAGNESIUM 20.6 (20 BASE) MG CPDR    Take 1-2 tablets by mouth daily.   OXYCODONE-ACETAMINOPHEN (PERCOCET) 10-325 MG PER TABLET    Take 1 tablet by mouth every 6 (six) hours as needed for pain (max of 4 per day).  Modified Medications   No medications on file  Discontinued Medications   AMITRIPTYLINE (ELAVIL) 25 MG TABLET    Take 1 tablet (25 mg total) by mouth at bedtime.   TIZANIDINE (ZANAFLEX) 4 MG TABLET    Take 1 tablet (4 mg total) by mouth Nightly.

## 2013-05-27 ENCOUNTER — Encounter: Payer: Self-pay | Admitting: Family Medicine

## 2013-05-27 DIAGNOSIS — Z820 Family history of epilepsy and other diseases of the nervous system: Secondary | ICD-10-CM

## 2013-05-27 DIAGNOSIS — F331 Major depressive disorder, recurrent, moderate: Secondary | ICD-10-CM

## 2013-05-27 HISTORY — DX: Family history of epilepsy and other diseases of the nervous system: Z82.0

## 2013-05-27 HISTORY — DX: Major depressive disorder, recurrent, moderate: F33.1

## 2013-06-19 ENCOUNTER — Other Ambulatory Visit: Payer: Self-pay | Admitting: Family Medicine

## 2013-07-03 ENCOUNTER — Ambulatory Visit (INDEPENDENT_AMBULATORY_CARE_PROVIDER_SITE_OTHER): Payer: BC Managed Care – PPO | Admitting: Family Medicine

## 2013-07-03 ENCOUNTER — Encounter: Payer: Self-pay | Admitting: Family Medicine

## 2013-07-03 VITALS — BP 110/76 | HR 88 | Temp 98.1°F | Ht 68.5 in | Wt 208.5 lb

## 2013-07-03 DIAGNOSIS — F411 Generalized anxiety disorder: Secondary | ICD-10-CM

## 2013-07-03 DIAGNOSIS — F331 Major depressive disorder, recurrent, moderate: Secondary | ICD-10-CM

## 2013-07-03 NOTE — Progress Notes (Signed)
Pre visit review using our clinic review tool, if applicable. No additional management support is needed unless otherwise documented below in the visit note. 

## 2013-07-03 NOTE — Progress Notes (Signed)
>  15 minutes spent in face to face time with patient, >50% spent in counselling or coordination of care  occ will have some weird dreams, has woken up with some kicking. Wife, not as short tempered. Arguing some a little bit.   Trying to not nap during the day. If he can stay up, will sleep all night. Interest - wants to go out and do things.  No guilt.  No SI or HI.  Overall, he is improved compared to his prior office visit. He is still not perfect, he is plagued with chronic pain and chronic neck pain. He does have interest in doing things, and encouraged him to be more physically active and to try to interact with others around the community.  Signed,  Maud Deed. Fountain Derusha, MD, [place]

## 2013-07-14 ENCOUNTER — Other Ambulatory Visit: Payer: Self-pay | Admitting: Family Medicine

## 2013-11-14 ENCOUNTER — Other Ambulatory Visit: Payer: Self-pay | Admitting: Family Medicine

## 2013-12-19 ENCOUNTER — Ambulatory Visit (INDEPENDENT_AMBULATORY_CARE_PROVIDER_SITE_OTHER): Payer: BC Managed Care – PPO | Admitting: Internal Medicine

## 2013-12-19 ENCOUNTER — Encounter: Payer: Self-pay | Admitting: Internal Medicine

## 2013-12-19 VITALS — BP 124/90 | HR 105 | Temp 99.3°F | Wt 203.0 lb

## 2013-12-19 DIAGNOSIS — J069 Acute upper respiratory infection, unspecified: Secondary | ICD-10-CM

## 2013-12-19 DIAGNOSIS — B9789 Other viral agents as the cause of diseases classified elsewhere: Principal | ICD-10-CM

## 2013-12-19 MED ORDER — PROMETHAZINE-DM 6.25-15 MG/5ML PO SYRP: 5.0000 mL | ORAL_SOLUTION | Freq: Four times a day (QID) | ORAL | Status: DC | PRN

## 2013-12-19 NOTE — Progress Notes (Signed)
HPI  Pt presents to the clinic today with c/o cough and chest congestion. He reports this started 2-3 days ago. The cough is productive of thick yellow mucous. He has coughed to the point of vomiting. He has had some associated sore throat but denies fever, chills or body aches. He has no history of allergies or breathing problems. He is a current smoker. He reports that he has had sick contacts.  Review of Systems      Past Medical History  Diagnosis Date  . Neck pain     cervical spine with herniation  . HLD (hyperlipidemia)   . Tobacco abuse   . GERD (gastroesophageal reflux disease)   . Wrist fracture, left   . Hiatal hernia   . Chronic neck pain 05/20/2012  . Family hx of ALS (amyotrophic lateral sclerosis) 05/27/2013  . Major depressive disorder, recurrent episode, moderate 05/27/2013    Family History  Problem Relation Age of Onset  . Colon cancer Neg Hx   . Esophageal cancer Neg Hx   . Rectal cancer Neg Hx   . Stomach cancer Neg Hx   . ALS Mother   . ALS Brother   . ALS Maternal Uncle     History   Social History  . Marital Status: Married    Spouse Name: N/A    Number of Children: N/A  . Years of Education: N/A   Occupational History  . Not on file.   Social History Main Topics  . Smoking status: Former Smoker -- 30 years  . Smokeless tobacco: Never Used  . Alcohol Use: No  . Drug Use: No  . Sexual Activity:    Partners: Female   Other Topics Concern  . Not on file   Social History Narrative    Allergies  Allergen Reactions  . Methocarbamol Shortness Of Breath  . Gabapentin      Constitutional: Denies headache, fatigue, fever or abrupt weight changes.  HEENT:  Positive sore throat. Denies eye redness, eye pain, pressure behind the eyes, facial pain, nasal congestion, ear pain, ringing in the ears, wax buildup, runny nose or bloody nose. Respiratory: Positive cough. Denies difficulty breathing or shortness of breath.  Cardiovascular: Denies  chest pain, chest tightness, palpitations or swelling in the hands or feet.   No other specific complaints in a complete review of systems (except as listed in HPI above).  Objective:   BP 124/90 mmHg  Pulse 105  Temp(Src) 99.3 F (37.4 C) (Oral)  Wt 203 lb (92.08 kg)  SpO2 96%  Wt Readings from Last 3 Encounters:  12/19/13 203 lb (92.08 kg)  07/03/13 208 lb 8 oz (94.575 kg)  05/22/13 205 lb 4 oz (93.101 kg)     General: Appears his stated age, well developed, well nourished in NAD. HEENT: Head: normal shape and size; Ears: Tm's gray and intact, normal light reflex; Nose: mucosa pink and moist, septum midline; Throat/Mouth:  mucosa erythematous and moist, no exudate noted, no lesions or ulcerations noted.  Cardiovascular: Normal rate and rhythm. S1,S2 noted.  No murmur, rubs or gallops noted.  Pulmonary/Chest: Normal effort and positive vesicular breath sounds. No respiratory distress. No wheezes, rales or ronchi noted.      Assessment & Plan:   Viral Upper Respiratory Infection with cough:  Get some rest and drink plenty of water Do salt water gargles for the sore throat Ibuprofen for body aches/fever eRx for prometh-dex cough syrup  RTC as needed or if symptoms persist.

## 2013-12-19 NOTE — Patient Instructions (Signed)
Cough, Adult  A cough is a reflex that helps clear your throat and airways. It can help heal the body or may be a reaction to an irritated airway. A cough may only last 2 or 3 weeks (acute) or may last more than 8 weeks (chronic).  CAUSES Acute cough:  Viral or bacterial infections. Chronic cough:  Infections.  Allergies.  Asthma.  Post-nasal drip.  Smoking.  Heartburn or acid reflux.  Some medicines.  Chronic lung problems (COPD).  Cancer. SYMPTOMS   Cough.  Fever.  Chest pain.  Increased breathing rate.  High-pitched whistling sound when breathing (wheezing).  Colored mucus that you cough up (sputum). TREATMENT   A bacterial cough may be treated with antibiotic medicine.  A viral cough must run its course and will not respond to antibiotics.  Your caregiver may recommend other treatments if you have a chronic cough. HOME CARE INSTRUCTIONS   Only take over-the-counter or prescription medicines for pain, discomfort, or fever as directed by your caregiver. Use cough suppressants only as directed by your caregiver.  Use a cold steam vaporizer or humidifier in your bedroom or home to help loosen secretions.  Sleep in a semi-upright position if your cough is worse at night.  Rest as needed.  Stop smoking if you smoke. SEEK IMMEDIATE MEDICAL CARE IF:   You have pus in your sputum.  Your cough starts to worsen.  You cannot control your cough with suppressants and are losing sleep.  You begin coughing up blood.  You have difficulty breathing.  You develop pain which is getting worse or is uncontrolled with medicine.  You have a fever. MAKE SURE YOU:   Understand these instructions.  Will watch your condition.  Will get help right away if you are not doing well or get worse. Document Released: 07/18/2010 Document Revised: 04/13/2011 Document Reviewed: 07/18/2010 ExitCare Patient Information 2015 [employer]. This information is not intended  to replace advice given to you by your health care provider. Make sure you discuss any questions you have with your health care provider.  

## 2013-12-19 NOTE — Progress Notes (Signed)
Pre visit review using our clinic review tool, if applicable. No additional management support is needed unless otherwise documented below in the visit note. 

## 2014-01-03 ENCOUNTER — Ambulatory Visit: Payer: Self-pay | Admitting: Family Medicine

## 2014-01-08 ENCOUNTER — Encounter: Payer: Self-pay | Admitting: *Deleted

## 2014-01-08 ENCOUNTER — Ambulatory Visit (INDEPENDENT_AMBULATORY_CARE_PROVIDER_SITE_OTHER): Payer: BC Managed Care – PPO | Admitting: Family Medicine

## 2014-01-08 ENCOUNTER — Encounter: Payer: Self-pay | Admitting: Family Medicine

## 2014-01-08 VITALS — Ht 68.5 in | Wt 201.2 lb

## 2014-01-08 DIAGNOSIS — F411 Generalized anxiety disorder: Secondary | ICD-10-CM

## 2014-01-08 DIAGNOSIS — R7989 Other specified abnormal findings of blood chemistry: Secondary | ICD-10-CM

## 2014-01-08 DIAGNOSIS — Z23 Encounter for immunization: Secondary | ICD-10-CM

## 2014-01-08 DIAGNOSIS — F331 Major depressive disorder, recurrent, moderate: Secondary | ICD-10-CM

## 2014-01-08 DIAGNOSIS — R945 Abnormal results of liver function studies: Principal | ICD-10-CM

## 2014-01-08 LAB — HEPATIC FUNCTION PANEL
ALK PHOS: 103 U/L (ref 39–117)
ALT: 55 U/L — AB (ref 0–53)
AST: 40 U/L — ABNORMAL HIGH (ref 0–37)
Albumin: 3.8 g/dL (ref 3.5–5.2)
Bilirubin, Direct: 0 mg/dL (ref 0.0–0.3)
TOTAL PROTEIN: 7 g/dL (ref 6.0–8.3)
Total Bilirubin: 0.4 mg/dL (ref 0.2–1.2)

## 2014-01-08 NOTE — Progress Notes (Signed)
Pre visit review using our clinic review tool, if applicable. No additional management support is needed unless otherwise documented below in the visit note. 

## 2014-01-08 NOTE — Progress Notes (Signed)
Dr. Frederico Hamman T. Diangelo Radel, MD, [facility] [place], [address] Phone: [phone] Fax: [phone]  01/08/2014  Patient: Matthew Eaton, MRN: [id number], DOB: Sep 26, 1960, 53 y.o.  Primary Physician:  Owens Loffler, MD  Chief Complaint: Follow-up  Subjective:   [name] is a 53 y.o. very pleasant male patient who presents with the following:  Neck and back are doing a little better but his neck is hurting some, some pain in the shoulder. Some tingling.   Flu shot  Mainly wanted to see him back to follow up and see how is doing from a depression standpoint. He had gotten fairly down and depressed after his mother died, and he also has his brother recently getting a diagnosis of ALS. He is doing okay, and a little bit better now compared to previously.  His neck is under a lot better control, which is helping somewhat. He still has some symptoms in his neck and back.  Past Medical History, Surgical History, Social History, Family History, Problem List, Medications, and Allergies have been reviewed and updated if relevant.   GEN: No acute illnesses, no fevers, chills. GI: No n/v/d, eating normally Pulm: No SOB Interactive and getting along well at home.  Otherwise, ROS is as per the HPI.  Objective:   Ht 5' 8.5" (1.74 m)  Wt 201 lb 4 oz (91.286 kg)  BMI 30.15 kg/m2  GEN: WDWN, NAD, Non-toxic, A & O x 3 HEENT: Atraumatic, Normocephalic. Neck supple. No masses, No LAD. Ears and Nose: No external deformity. CV: RRR, No M/G/R. No JVD. No thrill. No extra heart sounds. PULM: CTA B, no wheezes, crackles, rhonchi. No retractions. No resp. distress. No accessory muscle use. EXTR: No c/c/e NEURO Normal gait.  PSYCH: Normally interactive. Conversant. Not depressed or anxious appearing.  Calm demeanor.   Laboratory and Imaging Data: Results for orders placed or performed in visit on 01/08/14  Hepatic function  panel  Result Value Ref Range   Total Bilirubin 0.4 0.2 - 1.2 mg/dL   Bilirubin, Direct 0.0 0.0 - 0.3 mg/dL   Alkaline Phosphatase 103 39 - 117 U/L   AST 40 (H) 0 - 37 U/L   ALT 55 (H) 0 - 53 U/L   Total Protein 7.0 6.0 - 8.3 g/dL   Albumin 3.8 3.5 - 5.2 g/dL     Assessment and Plan:   Elevated LFTs - Plan: Hepatic function panel  Need for prophylactic vaccination and inoculation against influenza - Plan: Flu Vaccine QUAD 36+ mos IM  Major depressive disorder, recurrent episode, moderate  Generalized anxiety disorder  His LFTs were elevated the last time he was in the office, so will not repeat them and see what their trend looks like.  Depression is stable  Follow-up: Return in about 6 months (around 07/10/2014) for CPX.  New Prescriptions   No medications on file   Orders Placed This Encounter  Procedures  . Flu Vaccine QUAD 36+ mos IM  . Hepatic function panel    Signed,  Frederico Hamman T. Jenicka Coxe, MD   Patient's Medications  New Prescriptions   No medications on file  Previous Medications   ASPIRIN 81 MG TABLET    Take 81 mg by mouth daily.     ATORVASTATIN (LIPITOR) 80 MG TABLET    TAKE 1 TABLET BY MOUTH ONCE DAILY AT 6:00 PM.   BUPROPION (WELLBUTRIN XL) 150 MG 24 HR TABLET    TAKE 1  TABLET BY MOUTH DAILY.   CITALOPRAM (CELEXA) 40 MG TABLET    TAKE 1 TABLET BY MOUTH DAILY.   NONFORMULARY OR COMPOUNDED ITEM    Pentox/keta/bupi/diclo/doxe/gaba/orphen   OMEPRAZOLE MAGNESIUM 20.6 (20 BASE) MG CPDR    Take 1-2 tablets by mouth daily.   OXYCODONE-ACETAMINOPHEN (PERCOCET) 10-325 MG PER TABLET    Take 1 tablet by mouth every 6 (six) hours as needed for pain (max of 4 per day).  Modified Medications   No medications on file  Discontinued Medications   PROMETHAZINE-DEXTROMETHORPHAN (PROMETHAZINE-DM) 6.25-15 MG/5ML SYRUP    Take 5 mLs by mouth 4 (four) times daily as needed for cough.

## 2014-01-11 ENCOUNTER — Other Ambulatory Visit: Payer: Self-pay | Admitting: Family Medicine

## 2014-02-05 ENCOUNTER — Other Ambulatory Visit: Payer: Self-pay | Admitting: Family Medicine

## 2014-02-28 ENCOUNTER — Telehealth: Payer: Self-pay

## 2014-02-28 NOTE — Telephone Encounter (Signed)
PLEASE NOTE: All timestamps contained within this report are represented as [place] Standard Time. CONFIDENTIALTY NOTICE: This fax transmission is intended only for the addressee. It contains information that is legally privileged, confidential or otherwise protected from use or disclosure. If you are not the intended recipient, you are strictly prohibited from reviewing, disclosing, copying using or disseminating any of this information or taking any action in reliance on or regarding this information. If you have received this fax in error, please notify us immediately by telephone so that we can arrange for its return to [place]. Phone: [phone], Toll-Free: [phone], Fax: [phone] Page: 1 of 2 Call Id: [id number] [place] Patient Name: Matthew Eaton Gender: Male DOB: 1960/02/23 Age: 54 Y 3 M 30 D Return Phone Number: [phone] (Primary), [phone] (Secondary) Address: [address] City/State/Zip: [place] [address] Client [place] Night - Client Client Site [facility] [place], [place] Type Call Call Type Triage / Clinical Caller Name Matthew Eaton Relationship To Patient Spouse Return Phone Number [phone] (Primary) Chief Complaint Depression Initial Comment Caller states her husband is on some meds for his anxiety or depression. His brother just passed away (last 05-20-2022 he had ALS and went to a Hospice house). He is getting antsy. He gets angry quick. He doesn't want to do anything. She is scared that he is going to have a nervous breakdown. Nurse Assessment Nurse: Venetia Maxon, RN, Manuela Schwartz Date/Time (Eastern Time): 02/27/2014 5:37:11 PM Confirm and document reason for call. If symptomatic, describe symptoms. ---Caller states her husband is on some meds for his anxiety or depression. His brother  age 42 yrs just passed away (last 05-20-22 he had ALS and went to a Hospice house). He is getting antsy. He gets angry quick. He doesn't want to do anything. She is scared that he is going to have a nervous breakdown. He is on workers comp and had 2 surgeries. on depression medication. She states visitation is Thurs and [facility] May 20, 2022. He takes 2 meds. He is eating and drinking liquids Buspirone 150mg  Citalopram HBR 40 mg Has the patient traveled out of the country within the last 30 days? ---No Does the patient require triage? ---Declined Triage Please document clinical information provided and list any resource used. ---RN attempted to triage current symptoms but spouse does not want to speak with anyone including family. RN advised wife to contact the PCP in am to get additional support for her husband Caller verbalized understanding. and grateful for the call . Guidelines Guideline Title Affirmed Question Affirmed Notes Nurse Date/Time (Eastern Time) Disp. Time Eilene Ghazi Time) Disposition Final User 02/27/2014 5:45:45 PM Clinical Call Yes Venetia Maxon, RN, Liliane Bade NOTE: All timestamps contained within this report are represented as [place] Standard Time. CONFIDENTIALTY NOTICE: This fax transmission is intended only for the addressee. It contains information that is legally privileged, confidential or otherwise protected from use or disclosure. If you are not the intended recipient, you are strictly prohibited from reviewing, disclosing, copying using or disseminating any of this information or taking any action in reliance on or regarding this information. If you have received this fax in error, please notify us immediately by telephone so that we can arrange for its return to [place]. Phone: [phone], Toll-Free: [phone], Fax: [phone] Page: 2 of 2 Call Id: [id number] After Care Instructions Given Call Event Type User Date / Time Description

## 2014-02-28 NOTE — Telephone Encounter (Signed)
i called the patient and his wife. He says that he is doing ok - in shock a little bit, but he is doing ok. "Has not really hit him yet." Brother had ALS but passed suddenly on Friday.  Electronically Signed  By: Owens Loffler, MD On: 02/28/2014 5:19 PM

## 2014-06-13 ENCOUNTER — Telehealth: Payer: Self-pay | Admitting: Family Medicine

## 2014-06-13 NOTE — Telephone Encounter (Signed)
Please schedule CPE with Dr. Lorelei Pont with fasting lab prior sometime in the next three months.

## 2014-06-13 NOTE — Telephone Encounter (Signed)
Lab 7/20 cpx 7/25 Pt aware

## 2014-06-18 ENCOUNTER — Telehealth: Payer: Self-pay | Admitting: Family Medicine

## 2014-06-18 NOTE — Telephone Encounter (Signed)
Pt called stating he has dental work tomorrow  He is having 7 teeth pulled.  The dentist wanted to know if he needs to stop taking the baby aspirin until after procedure.

## 2014-06-18 NOTE — Telephone Encounter (Signed)
Mr. Mester notified as instructed by telephone.

## 2014-06-18 NOTE — Telephone Encounter (Signed)
With that many teeth, i would stop it. He should be able to restart it the day after his procedure.

## 2014-07-10 ENCOUNTER — Other Ambulatory Visit: Payer: Self-pay | Admitting: Family Medicine

## 2014-08-09 ENCOUNTER — Other Ambulatory Visit: Payer: Self-pay | Admitting: Family Medicine

## 2014-08-21 ENCOUNTER — Other Ambulatory Visit: Payer: Self-pay | Admitting: Family Medicine

## 2014-08-21 DIAGNOSIS — Z125 Encounter for screening for malignant neoplasm of prostate: Secondary | ICD-10-CM

## 2014-08-21 DIAGNOSIS — E785 Hyperlipidemia, unspecified: Secondary | ICD-10-CM

## 2014-08-21 DIAGNOSIS — Z79899 Other long term (current) drug therapy: Secondary | ICD-10-CM

## 2014-08-22 ENCOUNTER — Other Ambulatory Visit (INDEPENDENT_AMBULATORY_CARE_PROVIDER_SITE_OTHER): Payer: BC Managed Care – PPO

## 2014-08-22 DIAGNOSIS — Z79899 Other long term (current) drug therapy: Secondary | ICD-10-CM

## 2014-08-22 DIAGNOSIS — R7989 Other specified abnormal findings of blood chemistry: Secondary | ICD-10-CM

## 2014-08-22 DIAGNOSIS — Z125 Encounter for screening for malignant neoplasm of prostate: Secondary | ICD-10-CM | POA: Diagnosis not present

## 2014-08-22 DIAGNOSIS — E785 Hyperlipidemia, unspecified: Secondary | ICD-10-CM | POA: Diagnosis not present

## 2014-08-22 LAB — CBC WITH DIFFERENTIAL/PLATELET
Basophils Absolute: 0 10*3/uL (ref 0.0–0.1)
Basophils Relative: 0.4 % (ref 0.0–3.0)
EOS PCT: 2.1 % (ref 0.0–5.0)
Eosinophils Absolute: 0.2 10*3/uL (ref 0.0–0.7)
HCT: 40.3 % (ref 39.0–52.0)
Hemoglobin: 13.4 g/dL (ref 13.0–17.0)
Lymphocytes Relative: 41.1 % (ref 12.0–46.0)
Lymphs Abs: 3.6 10*3/uL (ref 0.7–4.0)
MCHC: 33.3 g/dL (ref 30.0–36.0)
MCV: 93 fl (ref 78.0–100.0)
MONO ABS: 0.7 10*3/uL (ref 0.1–1.0)
MONOS PCT: 8.3 % (ref 3.0–12.0)
NEUTROS ABS: 4.3 10*3/uL (ref 1.4–7.7)
NEUTROS PCT: 48.1 % (ref 43.0–77.0)
Platelets: 237 10*3/uL (ref 150.0–400.0)
RBC: 4.33 Mil/uL (ref 4.22–5.81)
RDW: 13.8 % (ref 11.5–15.5)
WBC: 8.9 10*3/uL (ref 4.0–10.5)

## 2014-08-22 LAB — BASIC METABOLIC PANEL
BUN: 17 mg/dL (ref 6–23)
CO2: 30 mEq/L (ref 19–32)
Calcium: 8.9 mg/dL (ref 8.4–10.5)
Chloride: 103 mEq/L (ref 96–112)
Creatinine, Ser: 1.05 mg/dL (ref 0.40–1.50)
GFR: 78.29 mL/min (ref >60.00)
Glucose, Bld: 80 mg/dL (ref 70–99)
POTASSIUM: 3.6 meq/L (ref 3.5–5.1)
SODIUM: 141 meq/L (ref 135–145)

## 2014-08-22 LAB — LIPID PANEL
Cholesterol: 194 mg/dL (ref 0–200)
HDL: 49.7 mg/dL (ref >39.00)
NonHDL: 144.3
TRIGLYCERIDES: 394 mg/dL — AB (ref 0.0–149.0)
Total CHOL/HDL Ratio: 4
VLDL: 78.8 mg/dL — ABNORMAL HIGH (ref 0.0–40.0)

## 2014-08-22 LAB — HEPATIC FUNCTION PANEL
ALBUMIN: 3.8 g/dL (ref 3.5–5.2)
ALT: 39 U/L (ref 0–53)
AST: 23 U/L (ref 0–37)
Alkaline Phosphatase: 89 U/L (ref 39–117)
BILIRUBIN DIRECT: 0.1 mg/dL (ref 0.0–0.3)
TOTAL PROTEIN: 6 g/dL (ref 6.0–8.3)
Total Bilirubin: 0.4 mg/dL (ref 0.2–1.2)

## 2014-08-22 LAB — PSA: PSA: 0.66 ng/mL (ref 0.10–4.00)

## 2014-08-22 LAB — LDL CHOLESTEROL, DIRECT: LDL DIRECT: 75 mg/dL

## 2014-08-27 ENCOUNTER — Encounter: Payer: Self-pay | Admitting: Family Medicine

## 2014-08-27 ENCOUNTER — Ambulatory Visit (INDEPENDENT_AMBULATORY_CARE_PROVIDER_SITE_OTHER): Payer: BC Managed Care – PPO | Admitting: Family Medicine

## 2014-08-27 VITALS — BP 114/80 | HR 79 | Temp 98.0°F | Ht 68.5 in | Wt 200.5 lb

## 2014-08-27 DIAGNOSIS — Z0000 Encounter for general adult medical examination without abnormal findings: Secondary | ICD-10-CM

## 2014-08-27 NOTE — Progress Notes (Signed)
Pre visit review using our clinic review tool, if applicable. No additional management support is needed unless otherwise documented below in the visit note. 

## 2014-08-27 NOTE — Progress Notes (Signed)
Dr. Frederico Hamman T. Tex Conroy, MD, [facility] and Sports Medicine [place], [address] Phone: [phone] Fax: [phone]  08/27/2014  Patient: Matthew Eaton, MRN: [id number], DOB: Jun 25, 1960, 54 y.o.  Primary Physician:  Owens Loffler, MD  Chief Complaint: Annual Exam  Subjective:   Matthew Eaton is a 54 y.o. pleasant patient who presents with the following:  Preventative Health Maintenance Visit:  Health Maintenance Summary Reviewed and updated, unless pt declines services.  Tobacco History Reviewed. Alcohol: No concerns, no excessive use Exercise Habits: rare activity, rec at least 30 mins 5 times a week STD concerns: no risk or activity to increase risk Drug Use: None Encouraged self-testicular check  Depression is stable.  Doing well.  Chronic pain is ongoing, and an ongoing issue.  This is his primary limiting factor.  Health Maintenance  Topic Date Due  . HIV Screening  10/30/1975  . TETANUS/TDAP  10/30/1979  . INFLUENZA VACCINE  09/03/2014  . COLONOSCOPY  04/01/2021   Immunization History  Administered Date(s) Administered  . Influenza,inj,Quad PF,36+ Mos 01/08/2014   Patient Active Problem List   Diagnosis Date Noted  . Family hx of ALS (amyotrophic lateral sclerosis) 05/27/2013  . Major depressive disorder, recurrent episode, moderate 05/27/2013  . Obesity (BMI 30-39.9) 05/22/2013  . Insomnia 05/20/2012  . Chronic neck pain 05/20/2012  . Hiatal hernia 01/05/2011  . [place] DISEASE, CERVICAL 01/22/2010  . TOBACCO ABUSE, HX OF 01/22/2010  . Generalized anxiety disorder 03/06/2009  . HYPOGONADISM 12/19/2008  . HYPERLIPIDEMIA 11/15/2008  . GERD 11/15/2008  . FATIGUE 11/14/2008   Past Medical History  Diagnosis Date  . Neck pain     cervical spine with herniation  . HLD (hyperlipidemia)   . Tobacco abuse   . GERD (gastroesophageal reflux disease)   . Wrist fracture, left   . Hiatal hernia   . Chronic neck  pain 05/20/2012  . Family hx of ALS (amyotrophic lateral sclerosis) 05/27/2013  . Major depressive disorder, recurrent episode, moderate 05/27/2013   Past Surgical History  Procedure Laterality Date  . Neck surgery      x2- 2010, 2011   History   Social History  . Marital Status: Married    Spouse Name: N/A  . Number of Children: N/A  . Years of Education: N/A   Occupational History  . Not on file.   Social History Main Topics  . Smoking status: Former Smoker -- 30 years  . Smokeless tobacco: Never Used  . Alcohol Use: No  . Drug Use: No  . Sexual Activity:    Partners: Female   Other Topics Concern  . Not on file   Social History Narrative   Family History  Problem Relation Age of Onset  . Colon cancer Neg Hx   . Esophageal cancer Neg Hx   . Rectal cancer Neg Hx   . Stomach cancer Neg Hx   . ALS Mother   . ALS Brother   . ALS Maternal Uncle    Allergies  Allergen Reactions  . Methocarbamol Shortness Of Breath  . Gabapentin     Medication list has been reviewed and updated.   General: Denies fever, chills, sweats. No significant weight loss. Eyes: Denies blurring,significant itching ENT: Denies earache, sore throat, and hoarseness. Cardiovascular: Denies chest pains, palpitations, dyspnea on exertion Respiratory: Denies cough, dyspnea at rest,wheeezing Breast: no concerns about lumps GI: Denies nausea, vomiting, diarrhea, constipation, change in bowel habits, abdominal pain, melena, hematochezia GU:  Denies penile discharge, ED, urinary flow / outflow problems. No STD concerns. Musculoskeletal: CHRONIC ONGOING NECK AND BACK PROBLEMS Derm: Denies rash, itching Neuro: Denies  paresthesias, frequent falls, frequent headaches Psych: Denies depression, anxiety Endocrine: Denies cold intolerance, heat intolerance, polydipsia Heme: Denies enlarged lymph nodes Allergy: No hayfever  Objective:   BP 114/80 mmHg  Pulse 79  Temp(Src) 98 F (36.7 C) (Oral)  Ht  5' 8.5" (1.74 m)  Wt 200 lb 8 oz (90.946 kg)  BMI 30.04 kg/m2 Ideal Body Weight: Weight in (lb) to have BMI = 25: 166.5  No exam data present  GEN: well developed, well nourished, no acute distress Eyes: conjunctiva and lids normal, PERRLA, EOMI ENT: TM clear, nares clear, oral exam WNL Neck: supple, no lymphadenopathy, no thyromegaly, no JVD Pulm: clear to auscultation and percussion, respiratory effort normal CV: regular rate and rhythm, S1-S2, no murmur, rub or gallop, no bruits, peripheral pulses normal and symmetric, no cyanosis, clubbing, edema or varicosities GI: soft, non-tender; no hepatosplenomegaly, masses; active bowel sounds all quadrants GU: no hernia, testicular mass, penile discharge Lymph: no cervical, axillary or inguinal adenopathy MSK: gait normal, muscle tone and strength WNL, no joint swelling, effusions, discoloration, crepitus  SKIN: clear, good turgor, color WNL, no rashes, lesions, or ulcerations Neuro: normal mental status Psych: alert; oriented to person, place and time, normally interactive and not anxious or depressed in appearance. All labs reviewed with patient.  Lipids:    Component Value Date/Time   CHOL 194 08/22/2014 0839   TRIG 394.0* 08/22/2014 0839   HDL 49.70 08/22/2014 0839   LDLDIRECT 75.0 08/22/2014 0839   VLDL 78.8* 08/22/2014 0839   CHOLHDL 4 08/22/2014 0839   CBC: CBC Latest Ref Rng 08/22/2014 05/18/2013 05/11/2012  WBC 4.0 - 10.5 K/uL 8.9 8.1 8.6  Hemoglobin 13.0 - 17.0 g/dL 13.4 13.6 14.4  Hematocrit 39.0 - 52.0 % 40.3 40.8 43.3  Platelets 150.0 - 400.0 K/uL 237.0 215.0 222.9    Basic Metabolic Panel:    Component Value Date/Time   NA 141 08/22/2014 0839   K 3.6 08/22/2014 0839   CL 103 08/22/2014 0839   CO2 30 08/22/2014 0839   BUN 17 08/22/2014 0839   CREATININE 1.05 08/22/2014 0839   GLUCOSE 80 08/22/2014 0839   CALCIUM 8.9 08/22/2014 0839   Hepatic Function Latest Ref Rng 08/22/2014 01/08/2014 05/18/2013  Total Protein  6.0 - 8.3 g/dL 6.0 7.0 6.6  Albumin 3.5 - 5.2 g/dL 3.8 3.8 3.6  AST 0 - 37 U/L 23 40(H) 53(H)  ALT 0 - 53 U/L 39 55(H) 74(H)  Alk Phosphatase 39 - 117 U/L 89 103 92  Total Bilirubin 0.2 - 1.2 mg/dL 0.4 0.4 0.7  Bilirubin, Direct 0.0 - 0.3 mg/dL 0.1 0.0 0.1    Lab Results  Component Value Date   TSH 2.04 10/12/2011   Lab Results  Component Value Date   PSA 0.66 08/22/2014   PSA 0.56 05/18/2013   PSA 0.53 05/12/2012    Assessment and Plan:   Healthcare maintenance  Health Maintenance Exam: The patient's preventative maintenance and recommended screening tests for an annual wellness exam were reviewed in full today. Brought up to date unless services declined.  Counselled on the importance of diet, exercise, and its role in overall health and mortality. The patient's FH and SH was reviewed, including their home life, tobacco status, and drug and alcohol status.  He asked me also about taking over for his chronic pain medication.  He is going  to be having a transition in his insurance coverage, and if I assume management of his chronic pain medication, which is 3 Percocet tablets daily, this will save him a significant amount of money financially.  Reviewed plan of care with this, and we would need to coordinate this with Dr. Unice Bailey office, but if he is stable, I would be okay to do this.  I reviewed our drug testing policy and the need to call in one week at a time for prescriptions.  Additionally, this would be where he would receive his narcotic medication.  Follow-up: Return in about 6 months (around 02/27/2015). Unless noted, follow-up in 1 year for Health Maintenance Exam.  Signed,  Frederico Hamman T. Juliene Kirsh, MD   Patient's Medications  New Prescriptions   No medications on file  Previous Medications   ASPIRIN 81 MG TABLET    Take 81 mg by mouth daily.     ATORVASTATIN (LIPITOR) 80 MG TABLET    Take 1 tablet (80 mg total) by mouth daily at 6 PM.   BUPROPION (WELLBUTRIN XL)  150 MG 24 HR TABLET    TAKE 1 TABLET BY MOUTH DAILY.   CITALOPRAM (CELEXA) 40 MG TABLET    TAKE 1 TABLET BY MOUTH DAILY.   NONFORMULARY OR COMPOUNDED ITEM    Pentox/keta/bupi/diclo/doxe/gaba/orphen   OMEPRAZOLE MAGNESIUM 20.6 (20 BASE) MG CPDR    Take 1-2 tablets by mouth daily.   OXYCODONE-ACETAMINOPHEN (PERCOCET) 10-325 MG PER TABLET    Take 1 tablet by mouth every 6 (six) hours as needed for pain (max of 4 per day).  Modified Medications   No medications on file  Discontinued Medications   No medications on file

## 2014-09-08 ENCOUNTER — Other Ambulatory Visit: Payer: Self-pay | Admitting: Family Medicine

## 2014-10-05 ENCOUNTER — Other Ambulatory Visit: Payer: Self-pay | Admitting: Family Medicine

## 2014-10-10 ENCOUNTER — Telehealth: Payer: Self-pay | Admitting: Family Medicine

## 2014-10-10 NOTE — Telephone Encounter (Signed)
Opened in error

## 2014-12-06 ENCOUNTER — Other Ambulatory Visit: Payer: Self-pay | Admitting: Family Medicine

## 2015-02-22 ENCOUNTER — Other Ambulatory Visit: Payer: Self-pay | Admitting: Family Medicine

## 2015-02-22 NOTE — Telephone Encounter (Signed)
Last office visit 08/27/2014.  Advised to follow up in 6 months.  No future appointments scheduled.  Refill?

## 2015-02-23 ENCOUNTER — Other Ambulatory Visit: Payer: Self-pay | Admitting: Family Medicine

## 2015-02-26 ENCOUNTER — Other Ambulatory Visit: Payer: Self-pay | Admitting: Family Medicine

## 2015-03-06 ENCOUNTER — Encounter: Payer: Self-pay | Admitting: Family Medicine

## 2015-03-06 ENCOUNTER — Ambulatory Visit (INDEPENDENT_AMBULATORY_CARE_PROVIDER_SITE_OTHER): Payer: BC Managed Care – PPO | Admitting: Family Medicine

## 2015-03-06 VITALS — BP 124/86 | HR 96 | Temp 97.7°F | Ht 68.5 in | Wt 228.2 lb

## 2015-03-06 DIAGNOSIS — E785 Hyperlipidemia, unspecified: Secondary | ICD-10-CM

## 2015-03-06 DIAGNOSIS — Z23 Encounter for immunization: Secondary | ICD-10-CM | POA: Diagnosis not present

## 2015-03-06 DIAGNOSIS — M5030 Other cervical disc degeneration, unspecified cervical region: Secondary | ICD-10-CM | POA: Diagnosis not present

## 2015-03-06 DIAGNOSIS — F411 Generalized anxiety disorder: Secondary | ICD-10-CM | POA: Diagnosis not present

## 2015-03-06 DIAGNOSIS — M542 Cervicalgia: Secondary | ICD-10-CM

## 2015-03-06 DIAGNOSIS — R5383 Other fatigue: Secondary | ICD-10-CM

## 2015-03-06 DIAGNOSIS — E291 Testicular hypofunction: Secondary | ICD-10-CM

## 2015-03-06 DIAGNOSIS — G8929 Other chronic pain: Secondary | ICD-10-CM

## 2015-03-06 DIAGNOSIS — E669 Obesity, unspecified: Secondary | ICD-10-CM

## 2015-03-06 DIAGNOSIS — G4700 Insomnia, unspecified: Secondary | ICD-10-CM

## 2015-03-06 DIAGNOSIS — F331 Major depressive disorder, recurrent, moderate: Secondary | ICD-10-CM

## 2015-03-06 DIAGNOSIS — K219 Gastro-esophageal reflux disease without esophagitis: Secondary | ICD-10-CM

## 2015-03-06 NOTE — Progress Notes (Signed)
Dr. Frederico Hamman T. Toneka Fullen, MD, [facility] [place], [address] Phone: [phone] Fax: [phone]  03/06/2015  Patient: Matthew Eaton, MRN: [id number], DOB: 02/28/1960, 55 y.o.  Primary Physician:  Owens Loffler, MD   Chief Complaint  Patient presents with  . Complete Forms   Subjective:   Matthew Eaton is a 55 y.o. very pleasant male patient who presents with the following:  Form completion for disability:  Past Medical History, Surgical History, Social History, Family History, Problem List, Medications, and Allergies have been reviewed and updated if relevant.  Patient Active Problem List   Diagnosis Date Noted  . Family hx of ALS (amyotrophic lateral sclerosis) 05/27/2013  . Major depressive disorder, recurrent episode, moderate ([place]) 05/27/2013  . Obesity (BMI 30-39.9) 05/22/2013  . Insomnia 05/20/2012  . Chronic neck pain 05/20/2012  . Hiatal hernia 01/05/2011  . [place] DISEASE, CERVICAL 01/22/2010  . TOBACCO ABUSE, HX OF 01/22/2010  . Generalized anxiety disorder 03/06/2009  . HYPOGONADISM 12/19/2008  . HYPERLIPIDEMIA 11/15/2008  . GERD 11/15/2008  . FATIGUE 11/14/2008    Past Medical History  Diagnosis Date  . Neck pain     cervical spine with herniation  . HLD (hyperlipidemia)   . Tobacco abuse   . GERD (gastroesophageal reflux disease)   . Wrist fracture, left   . Hiatal hernia   . Chronic neck pain 05/20/2012  . Family hx of ALS (amyotrophic lateral sclerosis) 05/27/2013  . Major depressive disorder, recurrent episode, moderate ([place]) 05/27/2013    Past Surgical History  Procedure Laterality Date  . Neck surgery      x2- 2010, 2011    Social History   Social History  . Marital Status: Married    Spouse Name: N/A  . Number of Children: N/A  . Years of Education: N/A   Occupational History  . Not on file.   Social History Main Topics  . Smoking status: Former Smoker -- 30 years    . Smokeless tobacco: Never Used  . Alcohol Use: No  . Drug Use: No  . Sexual Activity:    Partners: Female   Other Topics Concern  . Not on file   Social History Narrative    Family History  Problem Relation Age of Onset  . Colon cancer Neg Hx   . Esophageal cancer Neg Hx   . Rectal cancer Neg Hx   . Stomach cancer Neg Hx   . ALS Mother   . ALS Brother   . ALS Maternal Uncle     Allergies  Allergen Reactions  . Methocarbamol Shortness Of Breath  . Gabapentin     Medication list reviewed and updated in full in [facility].   GEN: No acute illnesses, no fevers, chills. GI: No n/v/d, eating normally Pulm: No SOB Interactive and getting along well at home.  Otherwise, ROS is as per the HPI.  Objective:   BP 124/86 mmHg  Pulse 96  Temp(Src) 97.7 F (36.5 C) (Oral)  Ht 5' 8.5" (1.74 m)  Wt 228 lb 4 oz (103.534 kg)  BMI 34.20 kg/m2  GEN: WDWN, NAD, Non-toxic, A & O x 3 HEENT: Atraumatic, Normocephalic. Neck supple. No masses, No LAD. Ears and Nose: No external deformity. CV: RRR, No M/G/R. No JVD. No thrill. No extra heart sounds. PULM: CTA B, no wheezes, crackles, rhonchi. No retractions. No resp. distress. No accessory muscle use. EXTR: No c/c/e NEURO Normal gait.  PSYCH: Normally interactive. Conversant. Not depressed or anxious appearing.  Calm demeanor.   Grip intact Minimal motion at the neck Inability to abduct above head Flexion and ext at elbow grossly preserved  Laboratory and Imaging Data:  Assessment and Plan:   Need for prophylactic vaccination and inoculation against influenza - Plan: Flu Vaccine QUAD 36+ mos IM  Need for prophylactic vaccination with combined diphtheria-tetanus-pertussis (DTP) vaccine - Plan: Tdap vaccine greater than or equal to 7yo IM  Chronic neck pain  DISC DISEASE, CERVICAL  Generalized anxiety disorder  Major depressive disorder, recurrent episode, moderate (H[facility])  Hyperlipidemia  Gastroesophageal  reflux disease without esophagitis  Other fatigue  Hypogonadism in male  Insomnia  Obesity (BMI 30-39.9)  >25 minutes spent in face to face time with patient, >50% spent in counselling or coordination of care: long face to face conversation and exam, record review regarding limitations and completed paperwork for his attorney for social security disability. Dr. Rolena Infante involved and he has had a FCE in the past.  Follow-up: No Follow-up on file.  Orders Placed This Encounter  Procedures  . Flu Vaccine QUAD 36+ mos IM  . Tdap vaccine greater than or equal to 7yo IM    Signed,  Marbeth Smedley T. Caleel Kiner, MD   Patient's Medications  New Prescriptions   No medications on file  Previous Medications   ASPIRIN 81 MG TABLET    Take 81 mg by mouth daily.     ATORVASTATIN (LIPITOR) 80 MG TABLET    TAKE 1 TABLET BY MOUTH DAILY AT 6 PM.   BUPROPION (WELLBUTRIN XL) 150 MG 24 HR TABLET    TAKE 1 TABLET BY MOUTH DAILY.   CHOLECALCIFEROL (VITAMIN D3) 10000 UNITS CAPSULE    Take 10,000 Units by mouth daily.   CITALOPRAM (CELEXA) 40 MG TABLET    TAKE 1 TABLET BY MOUTH DAILY.   MAGNESIUM OXIDE 500 MG TABS    Take 2 tablets by mouth daily.   NONFORMULARY OR COMPOUNDED ITEM    Pentox/keta/bupi/diclo/doxe/gaba/orphen   OMEPRAZOLE MAGNESIUM 20.6 (20 BASE) MG CPDR    Take 1-2 tablets by mouth daily.   OXYCODONE-ACETAMINOPHEN (PERCOCET) 10-325 MG PER TABLET    Take 1 tablet by mouth every 6 (six) hours as needed for pain (max of 4 per day).   POTASSIUM 99 MG TABS    Take 1 tablet by mouth daily.  Modified Medications   No medications on file  Discontinued Medications   AMITRIPTYLINE (ELAVIL) 25 MG TABLET

## 2015-03-06 NOTE — Progress Notes (Signed)
Pre visit review using our clinic review tool, if applicable. No additional management support is needed unless otherwise documented below in the visit note. 

## 2015-04-09 ENCOUNTER — Other Ambulatory Visit: Payer: Self-pay | Admitting: Family Medicine

## 2015-04-12 ENCOUNTER — Other Ambulatory Visit: Payer: Self-pay | Admitting: Family Medicine

## 2015-04-26 ENCOUNTER — Telehealth: Payer: Self-pay | Admitting: Family Medicine

## 2015-04-26 NOTE — Telephone Encounter (Signed)
Appointment 4/19 Pt spouse aware He will bring all his meds with him

## 2015-04-26 NOTE — Telephone Encounter (Signed)
Pt spouse came in today stating mr Dehnert pain management dr no longer takes insurance its cash only.  She wanted if you would take over his pain management Please advise

## 2015-04-26 NOTE — Telephone Encounter (Signed)
Would you see if burrell can come in to talk about in the next 3-4 weeks. I need to make sure I understand exactly what he is currently doing and make sure I am comfortable with taking care of it.

## 2015-05-22 ENCOUNTER — Encounter: Payer: Self-pay | Admitting: Family Medicine

## 2015-05-22 ENCOUNTER — Ambulatory Visit (INDEPENDENT_AMBULATORY_CARE_PROVIDER_SITE_OTHER): Payer: BC Managed Care – PPO | Admitting: Family Medicine

## 2015-05-22 VITALS — BP 110/74 | HR 83 | Temp 98.2°F | Ht 68.5 in | Wt 220.5 lb

## 2015-05-22 DIAGNOSIS — G8929 Other chronic pain: Secondary | ICD-10-CM | POA: Diagnosis not present

## 2015-05-22 DIAGNOSIS — M542 Cervicalgia: Secondary | ICD-10-CM | POA: Diagnosis not present

## 2015-05-22 MED ORDER — HYDROCODONE-ACETAMINOPHEN 10-325 MG PO TABS: 1.0000 | ORAL_TABLET | Freq: Three times a day (TID) | ORAL | Status: DC

## 2015-05-22 NOTE — Progress Notes (Signed)
Pre visit review using our clinic review tool, if applicable. No additional management support is needed unless otherwise documented below in the visit note. 

## 2015-05-22 NOTE — Progress Notes (Signed)
Dr. Frederico Hamman T. Alzina Golda, MD, [facility] [place], [address] Phone: [phone] Fax: [phone]  05/22/2015  Patient: Matthew Eaton, MRN: [id number], DOB: [birth year], 55 y.o.  Primary Physician:  Owens Loffler, MD   Chief Complaint  Patient presents with  . Follow-up    Pain Management   Subjective:   Matthew Eaton is a 55 y.o. very pleasant male patient who presents with the following:  Chronic neck pain: he has been on chronic narcotics now 1 tablet of either hydrocodone or oxycodone at 10 mg 3 times a day for a number of years.  He is status post 2 different neck operations in 2010 2011, and he is currently on disability.  He has been followed by Dr. Ace Gins, , but currently she is no longer taking medical insurance.  Therefore he called me to see if I would take over the management of his chronic pain.  Resume from   Past Medical History, Surgical History, Social History, Family History, Problem List, Medications, and Allergies have been reviewed and updated if relevant.  Patient Active Problem List   Diagnosis Date Noted  . Family hx of ALS (amyotrophic lateral sclerosis) 05/27/2013  . Major depressive disorder, recurrent episode, moderate ([place]) 05/27/2013  . Obesity (BMI 30-39.9) 05/22/2013  . Insomnia 05/20/2012  . Chronic neck pain 05/20/2012  . Hiatal hernia 01/05/2011  . [place] DISEASE, CERVICAL 01/22/2010  . TOBACCO ABUSE, HX OF 01/22/2010  . Generalized anxiety disorder 03/06/2009  . Hypogonadism in male 12/19/2008  . Hyperlipidemia 11/15/2008  . GERD 11/15/2008  . Fatigue 11/14/2008    Past Medical History  Diagnosis Date  . Neck pain     cervical spine with herniation  . HLD (hyperlipidemia)   . Tobacco abuse   . GERD (gastroesophageal reflux disease)   . Wrist fracture, left   . Hiatal hernia   . Chronic neck pain 05/20/2012  . Family hx of ALS (amyotrophic lateral sclerosis)  05/27/2013  . Major depressive disorder, recurrent episode, moderate ([place]) 05/27/2013    Past Surgical History  Procedure Laterality Date  . Neck surgery      x2- 2010, 2011    Social History   Social History  . Marital Status: Married    Spouse Name: N/A  . Number of Children: N/A  . Years of Education: N/A   Occupational History  . Not on file.   Social History Main Topics  . Smoking status: Former Smoker -- 30 years  . Smokeless tobacco: Never Used  . Alcohol Use: No  . Drug Use: No  . Sexual Activity:    Partners: Female   Other Topics Concern  . Not on file   Social History Narrative    Family History  Problem Relation Age of Onset  . Colon cancer Neg Hx   . Esophageal cancer Neg Hx   . Rectal cancer Neg Hx   . Stomach cancer Neg Hx   . ALS Mother   . ALS Brother   . ALS Maternal Uncle     Allergies  Allergen Reactions  . Methocarbamol Shortness Of Breath  . Gabapentin     Medication list reviewed and updated in full in [facility].  GEN: No fevers, chills. Nontoxic. Primarily MSK c/o today. MSK: Detailed in the HPI GI: tolerating PO intake without difficulty Neuro: No numbness, parasthesias, or tingling associated. Otherwise the pertinent positives of the ROS are noted above.  Objective:   BP 110/74 mmHg  Pulse 83  Temp(Src) 98.2 F (36.8 C) (Oral)  Ht 5' 8.5" (1.74 m)  Wt 220 lb 8 oz (100.018 kg)  BMI 33.04 kg/m2   GEN: WDWN, NAD, Non-toxic, Alert & Oriented x 3 HEENT: Atraumatic, Normocephalic.  Ears and Nose: No external deformity. EXTR: No clubbing/cyanosis/edema NEURO: Normal gait.  PSYCH: Normally interactive. Conversant. Not depressed or anxious appearing.  Calm demeanor.    Radiology: No results found.  Assessment and Plan:   Chronic neck pain  I know this patient very well.  I feel comfortable managing his pain at this point.  I reviewed are controlled substances agreement with the patient, and he signed it today.   He does not use any illicit substances.  I also reviewed the [place] controlled substances database.  He has been getting every 30 days of prescription of 90 of either hydrocodone or oxycodone combined with 325 mg of APAP.  Follow-up: ffor his yearly physical  New Prescriptions   No medications on file   Modified Medications   Modified Medication Previous Medication   HYDROCODONE-ACETAMINOPHEN (NORCO) 10-325 MG TABLET HYDROcodone-acetaminophen (NORCO) 10-325 MG tablet      Take 1 tablet by mouth every 8 (eight) hours.    Take 1 tablet by mouth every 8 (eight) hours.    No orders of the defined types were placed in this encounter.    Signed,  Maud Deed. Karla Pavone, MD   Patient's Medications  New Prescriptions   No medications on file  Previous Medications   ASPIRIN 81 MG TABLET    Take 81 mg by mouth daily.     ATORVASTATIN (LIPITOR) 80 MG TABLET    TAKE 1 TABLET BY MOUTH DAILY AT 6 PM.   BUPROPION (WELLBUTRIN XL) 150 MG 24 HR TABLET    TAKE 1 TABLET BY MOUTH DAILY.   CHOLECALCIFEROL (VITAMIN D3) 10000 UNITS CAPSULE    Take 10,000 Units by mouth daily.   CITALOPRAM (CELEXA) 40 MG TABLET    TAKE 1 TABLET BY MOUTH DAILY.   MAGNESIUM OXIDE 500 MG TABS    Take 2 tablets by mouth daily.   OMEPRAZOLE MAGNESIUM 20.6 (20 BASE) MG CPDR    Take 1-2 tablets by mouth daily.   POTASSIUM 99 MG TABS    Take 1 tablet by mouth daily.  Modified Medications   Modified Medication Previous Medication   HYDROCODONE-ACETAMINOPHEN (NORCO) 10-325 MG TABLET HYDROcodone-acetaminophen (NORCO) 10-325 MG tablet      Take 1 tablet by mouth every 8 (eight) hours.    Take 1 tablet by mouth every 8 (eight) hours.   Discontinued Medications   NONFORMULARY OR COMPOUNDED ITEM    Pentox/keta/bupi/diclo/doxe/gaba/orphen   OXYCODONE-ACETAMINOPHEN (PERCOCET) 10-325 MG PER TABLET    Take 1 tablet by mouth every 6 (six) hours as needed for pain (max of 4 per day).

## 2015-06-19 ENCOUNTER — Other Ambulatory Visit: Payer: Self-pay | Admitting: *Deleted

## 2015-06-19 MED ORDER — HYDROCODONE-ACETAMINOPHEN 10-325 MG PO TABS: 1.0000 | ORAL_TABLET | Freq: Three times a day (TID) | ORAL | Status: DC

## 2015-06-19 NOTE — Telephone Encounter (Signed)
Spoke to pts wife who is requesting medication refill for pt. Last seen 03/2015. pls advise

## 2015-06-19 NOTE — Telephone Encounter (Signed)
Last seen 05/22/2015.

## 2015-06-19 NOTE — Telephone Encounter (Signed)
Matthew Eaton notified that [facility] prescription is ready to be picked up at the front desk. 

## 2015-07-19 ENCOUNTER — Other Ambulatory Visit: Payer: Self-pay | Admitting: Family Medicine

## 2015-07-19 NOTE — Telephone Encounter (Signed)
V/M left requesting rx hydrocodone apap to be picked up on 07/19/15; pt will be out of med on 07/20/15.Please advise.

## 2015-07-19 NOTE — Telephone Encounter (Signed)
Last office visit 05/22/2015.  Last refilled 06/19/2015 for #90 with no refills.  Ok to refill?

## 2015-07-19 NOTE — Telephone Encounter (Signed)
Matthew Eaton notified prescription is ready to be picked up at the front desk.  I did ask Joelene Millin that next time [facility] needs a refill to please call at least 3 days in advance.

## 2015-08-08 ENCOUNTER — Other Ambulatory Visit: Payer: Self-pay

## 2015-08-08 MED ORDER — HYDROCODONE-ACETAMINOPHEN 10-325 MG PO TABS: ORAL_TABLET | ORAL | Status: DC

## 2015-08-08 NOTE — Telephone Encounter (Signed)
Kim[facility] notified prescription is ready to be picked up at the front desk.

## 2015-08-08 NOTE — Telephone Encounter (Signed)
Pt left v/m requesting rx hydrocodone apap. Call when ready for pick up. rx last printed # 90 on 07/19/15; last seen 05/22/15. Pt will be out of med on 15th but wanted to give enough time to get rx done.

## 2015-08-14 ENCOUNTER — Other Ambulatory Visit: Payer: Self-pay | Admitting: Family Medicine

## 2015-08-19 ENCOUNTER — Telehealth: Payer: Self-pay | Admitting: Family Medicine

## 2015-08-19 NOTE — Telephone Encounter (Signed)
Handicap Placard application placed in Dr. Lillie Fragmin in box for signature.  Matthew Eaton notified form is ready to be picked up at the front desk.

## 2015-08-19 NOTE — Telephone Encounter (Signed)
Pt's spouse dropped off handicap placard information to be completed.  Placed in prescription tower.  Best number to call Matthew Eaton is [phone]

## 2015-08-28 ENCOUNTER — Other Ambulatory Visit: Payer: Self-pay | Admitting: Family Medicine

## 2015-08-28 ENCOUNTER — Telehealth: Payer: Self-pay | Admitting: Family Medicine

## 2015-08-28 NOTE — Telephone Encounter (Signed)
Please call and schedule CPE with fasting labs for Dr. Lorelei Pont.

## 2015-08-30 NOTE — Telephone Encounter (Signed)
CPX 9/11 LABS 9/7 PT AWARE

## 2015-09-03 ENCOUNTER — Other Ambulatory Visit: Payer: Self-pay

## 2015-09-03 NOTE — Telephone Encounter (Signed)
Mrs Tinker wanted to give plenty of time for hydrocodone apap rx. Does not need to pick up until 09/09/15. Last printed # 90 on 08/08/15 and last seen 05/22/15,

## 2015-09-04 MED ORDER — HYDROCODONE-ACETAMINOPHEN 10-325 MG PO TABS: ORAL_TABLET | ORAL | 0 refills | Status: DC

## 2015-09-04 NOTE — Telephone Encounter (Signed)
[facility] notified that [facility] prescription is ready to be picked up at the front desk. 

## 2015-09-25 ENCOUNTER — Other Ambulatory Visit: Payer: Self-pay | Admitting: Family Medicine

## 2015-10-08 ENCOUNTER — Other Ambulatory Visit: Payer: Self-pay | Admitting: Family Medicine

## 2015-10-08 DIAGNOSIS — E559 Vitamin D deficiency, unspecified: Secondary | ICD-10-CM

## 2015-10-08 DIAGNOSIS — E538 Deficiency of other specified B group vitamins: Secondary | ICD-10-CM

## 2015-10-08 DIAGNOSIS — Z125 Encounter for screening for malignant neoplasm of prostate: Secondary | ICD-10-CM

## 2015-10-08 DIAGNOSIS — Z79899 Other long term (current) drug therapy: Secondary | ICD-10-CM

## 2015-10-08 DIAGNOSIS — E785 Hyperlipidemia, unspecified: Secondary | ICD-10-CM

## 2015-10-09 ENCOUNTER — Other Ambulatory Visit: Payer: Self-pay

## 2015-10-09 MED ORDER — HYDROCODONE-ACETAMINOPHEN 10-325 MG PO TABS: ORAL_TABLET | ORAL | 0 refills | Status: DC

## 2015-10-09 NOTE — Telephone Encounter (Signed)
Pt's wife (do not see DPR signed)left v/m requesting rx hydrocodone apap. Call when ready for pick up.last printed # 90 on 09/04/15 and last seen 05/22/15.

## 2015-10-10 ENCOUNTER — Encounter: Payer: Self-pay | Admitting: Family Medicine

## 2015-10-10 ENCOUNTER — Other Ambulatory Visit (INDEPENDENT_AMBULATORY_CARE_PROVIDER_SITE_OTHER): Payer: BC Managed Care – PPO

## 2015-10-10 DIAGNOSIS — E559 Vitamin D deficiency, unspecified: Secondary | ICD-10-CM

## 2015-10-10 DIAGNOSIS — Z125 Encounter for screening for malignant neoplasm of prostate: Secondary | ICD-10-CM | POA: Diagnosis not present

## 2015-10-10 DIAGNOSIS — E785 Hyperlipidemia, unspecified: Secondary | ICD-10-CM

## 2015-10-10 DIAGNOSIS — E538 Deficiency of other specified B group vitamins: Secondary | ICD-10-CM | POA: Diagnosis not present

## 2015-10-10 DIAGNOSIS — Z79899 Other long term (current) drug therapy: Secondary | ICD-10-CM

## 2015-10-10 LAB — CBC WITH DIFFERENTIAL/PLATELET
BASOS PCT: 0.3 % (ref 0.0–3.0)
Basophils Absolute: 0 10*3/uL (ref 0.0–0.1)
EOS PCT: 2.2 % (ref 0.0–5.0)
Eosinophils Absolute: 0.2 10*3/uL (ref 0.0–0.7)
HCT: 40.1 % (ref 39.0–52.0)
HEMOGLOBIN: 13.2 g/dL (ref 13.0–17.0)
Lymphocytes Relative: 30.6 % (ref 12.0–46.0)
Lymphs Abs: 2.4 10*3/uL (ref 0.7–4.0)
MCHC: 32.9 g/dL (ref 30.0–36.0)
MCV: 88.2 fl (ref 78.0–100.0)
MONO ABS: 0.7 10*3/uL (ref 0.1–1.0)
MONOS PCT: 9.4 % (ref 3.0–12.0)
Neutro Abs: 4.4 10*3/uL (ref 1.4–7.7)
Neutrophils Relative %: 57.5 % (ref 43.0–77.0)
Platelets: 229 10*3/uL (ref 150.0–400.0)
RBC: 4.55 Mil/uL (ref 4.22–5.81)
RDW: 14.5 % (ref 11.5–15.5)
WBC: 7.7 10*3/uL (ref 4.0–10.5)

## 2015-10-10 LAB — LDL CHOLESTEROL, DIRECT: LDL DIRECT: 96 mg/dL

## 2015-10-10 LAB — HEPATIC FUNCTION PANEL
ALT: 62 U/L — ABNORMAL HIGH (ref 0–53)
AST: 45 U/L — ABNORMAL HIGH (ref 0–37)
Albumin: 3.9 g/dL (ref 3.5–5.2)
Alkaline Phosphatase: 109 U/L (ref 39–117)
BILIRUBIN DIRECT: 0.1 mg/dL (ref 0.0–0.3)
BILIRUBIN TOTAL: 0.5 mg/dL (ref 0.2–1.2)
TOTAL PROTEIN: 6.4 g/dL (ref 6.0–8.3)

## 2015-10-10 LAB — LIPID PANEL
CHOL/HDL RATIO: 5
Cholesterol: 206 mg/dL — ABNORMAL HIGH (ref 0–200)
HDL: 41.8 mg/dL (ref >39.00)
NonHDL: 164.15
Triglycerides: 368 mg/dL — ABNORMAL HIGH (ref 0.0–149.0)
VLDL: 73.6 mg/dL — AB (ref 0.0–40.0)

## 2015-10-10 LAB — BASIC METABOLIC PANEL
BUN: 12 mg/dL (ref 6–23)
CHLORIDE: 103 meq/L (ref 96–112)
CO2: 33 mEq/L — ABNORMAL HIGH (ref 19–32)
Calcium: 9.2 mg/dL (ref 8.4–10.5)
Creatinine, Ser: 1.2 mg/dL (ref 0.40–1.50)
GFR: 66.82 mL/min (ref >60.00)
GLUCOSE: 91 mg/dL (ref 70–99)
POTASSIUM: 4.2 meq/L (ref 3.5–5.1)
SODIUM: 142 meq/L (ref 135–145)

## 2015-10-10 LAB — VITAMIN D 25 HYDROXY (VIT D DEFICIENCY, FRACTURES): VITD: 18.18 ng/mL — AB (ref 30.00–100.00)

## 2015-10-10 LAB — PSA: PSA: 0.6 ng/mL (ref 0.10–4.00)

## 2015-10-10 LAB — VITAMIN B12: Vitamin B-12: 229 pg/mL (ref 211–911)

## 2015-10-10 NOTE — Telephone Encounter (Signed)
Matthew Eaton notified that his prescription is ready to be picked up at the front desk.

## 2015-10-14 ENCOUNTER — Ambulatory Visit (INDEPENDENT_AMBULATORY_CARE_PROVIDER_SITE_OTHER): Payer: BC Managed Care – PPO | Admitting: Family Medicine

## 2015-10-14 ENCOUNTER — Encounter: Payer: Self-pay | Admitting: Family Medicine

## 2015-10-14 VITALS — BP 120/80 | HR 87 | Temp 98.5°F | Ht 68.5 in | Wt 221.5 lb

## 2015-10-14 DIAGNOSIS — Z23 Encounter for immunization: Secondary | ICD-10-CM | POA: Diagnosis not present

## 2015-10-14 DIAGNOSIS — E559 Vitamin D deficiency, unspecified: Secondary | ICD-10-CM | POA: Diagnosis not present

## 2015-10-14 DIAGNOSIS — Z0000 Encounter for general adult medical examination without abnormal findings: Secondary | ICD-10-CM | POA: Diagnosis not present

## 2015-10-14 DIAGNOSIS — E538 Deficiency of other specified B group vitamins: Secondary | ICD-10-CM

## 2015-10-14 MED ORDER — FENTANYL 12 MCG/HR TD PT72: 12.5000 ug | MEDICATED_PATCH | TRANSDERMAL | 0 refills | Status: DC

## 2015-10-14 NOTE — Progress Notes (Signed)
Pre visit review using our clinic review tool, if applicable. No additional management support is needed unless otherwise documented below in the visit note. 

## 2015-10-14 NOTE — Progress Notes (Signed)
Dr. Frederico Hamman T. Domini Vandehei, MD, [facility] and Sports Medicine [place], [address] Phone: [phone] Fax: [phone]  10/14/2015  Patient: Matthew Eaton, MRN: [id number], DOB: Apr 04, 1960, 55 y.o.  Primary Physician:  Owens Loffler, MD   Chief Complaint  Patient presents with  . Annual Exam   Subjective:   Matthew Eaton is a 55 y.o. pleasant patient who presents with the following:  Preventative Health Maintenance Visit:  Health Maintenance Summary Reviewed and updated, unless pt declines services.  Tobacco History Reviewed. Alcohol: No concerns, no excessive use Exercise Habits: no activity, rec at least 30 mins 5 times a week STD concerns: no risk or activity to increase risk Drug Use: None Encouraged self-testicular check  He is extremely limited from a physical standpoint secondary to his multiple issues with the spine.  This gets and down sometimes.  He does think that he is mildly depressed currently.  He currently is taking Celexa 40 mg as well as Wellbutrin 150 mg.  His pain is intermittently controlled with the hydrocodone that he was  Originally started on by pain management.  He is having breakthrough pain, and he is never been on a long-acting opioid.  Health Maintenance  Topic Date Due  . Hepatitis C Screening  [birth year]  . HIV Screening  10/30/1975  . COLONOSCOPY  04/01/2021  . TETANUS/TDAP  03/05/2025  . INFLUENZA VACCINE  Completed   Immunization History  Administered Date(s) Administered  . Influenza,inj,Quad PF,36+ Mos 01/08/2014, 03/06/2015, 10/14/2015  . Tdap 03/06/2015   Patient Active Problem List   Diagnosis Date Noted  . Vitamin D deficiency 10/15/2015  . Family hx of ALS (amyotrophic lateral sclerosis) 05/27/2013  . Major depressive disorder, recurrent episode, moderate ([place]) 05/27/2013  . Obesity (BMI 30-39.9) 05/22/2013  . Insomnia 05/20/2012  . Chronic neck pain 05/20/2012  .  Hiatal hernia 01/05/2011  . [place] DISEASE, CERVICAL 01/22/2010  . TOBACCO ABUSE, HX OF 01/22/2010  . Generalized anxiety disorder 03/06/2009  . Hypogonadism in male 12/19/2008  . Hyperlipidemia 11/15/2008  . GERD 11/15/2008  . Fatigue 11/14/2008   Past Medical History:  Diagnosis Date  . Chronic neck pain 05/20/2012  . Family hx of ALS (amyotrophic lateral sclerosis) 05/27/2013  . GERD (gastroesophageal reflux disease)   . Hiatal hernia   . HLD (hyperlipidemia)   . Major depressive disorder, recurrent episode, moderate ([place]) 05/27/2013  . Neck pain    cervical spine with herniation  . Tobacco abuse   . Wrist fracture, left    Past Surgical History:  Procedure Laterality Date  . NECK SURGERY     x2- 2010, 2011   Social History   Social History  . Marital status: Married    Spouse name: N/A  . Number of children: N/A  . Years of education: N/A   Occupational History  . Not on file.   Social History Main Topics  . Smoking status: Former Smoker    Years: 30.00  . Smokeless tobacco: Never Used  . Alcohol use No  . Drug use: No  . Sexual activity: Yes    Partners: Female   Other Topics Concern  . Not on file   Social History Narrative  . No narrative on file   Family History  Problem Relation Age of Onset  . Colon cancer Neg Hx   . Esophageal cancer Neg Hx   . Rectal cancer Neg Hx   . Stomach cancer Neg Hx   .  ALS Mother   . ALS Brother   . ALS Maternal Uncle    Allergies  Allergen Reactions  . Methocarbamol Shortness Of Breath  . Gabapentin     Medication list has been reviewed and updated.   General: Denies fever, chills, sweats. No significant weight loss. Eyes: Denies blurring,significant itching ENT: Denies earache, sore throat, and hoarseness. Cardiovascular: Denies chest pains, palpitations, dyspnea on exertion Respiratory: Denies cough, dyspnea at rest,wheeezing Breast: no concerns about lumps GI: Denies nausea, vomiting, diarrhea,  constipation, change in bowel habits, abdominal pain, melena, hematochezia GU: Denies penile discharge, ED, urinary flow / outflow problems. No STD concerns. Musculoskeletal: ONGOING CHRONIC BACK PAIN Derm: Denies rash, itching Neuro: Denies  paresthesias, frequent falls, frequent headaches Psych: SOME DEPRESSION Endocrine: Denies cold intolerance, heat intolerance, polydipsia Heme: Denies enlarged lymph nodes Allergy: No hayfever  Objective:   BP 120/80   Pulse 87   Temp 98.5 F (36.9 C) (Oral)   Ht 5' 8.5" (1.74 m)   Wt 221 lb 8 oz (100.5 kg)   BMI 33.19 kg/m  Ideal Body Weight: Weight in (lb) to have BMI = 25: 166.5  No exam data present  GEN: well developed, well nourished, no acute distress Eyes: conjunctiva and lids normal, PERRLA, EOMI ENT: TM clear, nares clear, oral exam WNL Neck: supple, no lymphadenopathy, no thyromegaly, no JVD Pulm: clear to auscultation and percussion, respiratory effort normal CV: regular rate and rhythm, S1-S2, no murmur, rub or gallop, no bruits, peripheral pulses normal and symmetric, no cyanosis, clubbing, edema or varicosities GI: soft, non-tender; no hepatosplenomegaly, masses; active bowel sounds all quadrants GU: no hernia, testicular mass, penile discharge Lymph: no cervical, axillary or inguinal adenopathy MSK: limited ROM at neck SKIN: clear, good turgor, color WNL, no rashes, lesions, or ulcerations Neuro: normal mental status, normal strength, sensation, and motion Psych: alert; oriented to person, place and time, normally interactive and not anxious or depressed in appearance. All labs reviewed with patient.  Lipids:    Component Value Date/Time   CHOL 206 (H) 10/10/2015 1128   TRIG 368.0 (H) 10/10/2015 1128   HDL 41.80 10/10/2015 1128   LDLDIRECT 96.0 10/10/2015 1128   VLDL 73.6 (H) 10/10/2015 1128   CHOLHDL 5 10/10/2015 1128   CBC: CBC Latest Ref Rng & Units 10/10/2015 08/22/2014 05/18/2013  WBC 4.0 - 10.5 K/uL 7.7 8.9 8.1   Hemoglobin 13.0 - 17.0 g/dL 13.2 13.4 13.6  Hematocrit 39.0 - 52.0 % 40.1 40.3 40.8  Platelets 150.0 - 400.0 K/uL 229.0 237.0 621.3    Basic Metabolic Panel:    Component Value Date/Time   NA 142 10/10/2015 1128   K 4.2 10/10/2015 1128   CL 103 10/10/2015 1128   CO2 33 (H) 10/10/2015 1128   BUN 12 10/10/2015 1128   CREATININE 1.20 10/10/2015 1128   GLUCOSE 91 10/10/2015 1128   CALCIUM 9.2 10/10/2015 1128   Hepatic Function Latest Ref Rng & Units 10/10/2015 08/22/2014 01/08/2014  Total Protein 6.0 - 8.3 g/dL 6.4 6.0 7.0  Albumin 3.5 - 5.2 g/dL 3.9 3.8 3.8  AST 0 - 37 U/L 45(H) 23 40(H)  ALT 0 - 53 U/L 62(H) 39 55(H)  Alk Phosphatase 39 - 117 U/L 109 89 103  Total Bilirubin 0.2 - 1.2 mg/dL 0.5 0.4 0.4  Bilirubin, Direct 0.0 - 0.3 mg/dL 0.1 0.1 0.0    Lab Results  Component Value Date   TSH 2.04 10/12/2011   Lab Results  Component Value Date   PSA 0.60  10/10/2015   PSA 0.66 08/22/2014   PSA 0.56 05/18/2013    Assessment and Plan:   Healthcare maintenance  Need for prophylactic vaccination and inoculation against influenza - Plan: Flu Vaccine QUAD 36+ mos IM  Vitamin D deficiency  Health Maintenance Exam: The patient's preventative maintenance and recommended screening tests for an annual wellness exam were reviewed in full today. Brought up to date unless services declined.  Counselled on the importance of diet, exercise, and its role in overall health and mortality. The patient's FH and SH was reviewed, including their home life, tobacco status, and drug and alcohol status.  aadd B12 and vitamin D.  He is having significant breakthrough pain and up and down pain, so I think some type of long acting opioid would be a good fit here.   I'm going to start here with a low-dose fentanyl patch, then p.r.n. Hydrocodone.   Recheck in one month.  Follow-up: Return in about 1 month (around 11/13/2015). Unless noted, follow-up in 1 year for Health Maintenance Exam.  New  Prescriptions   FENTANYL (DURAGESIC - DOSED MCG/HR) 12 MCG/HR    Place 1 patch (12.5 mcg total) onto the skin every 3 (three) days.   Orders Placed This Encounter  Procedures  . Flu Vaccine QUAD 36+ mos IM    Signed,  Amanada Philbrick T. Rosaisela Jamroz, MD   Patient's Medications  New Prescriptions   FENTANYL (DURAGESIC - DOSED MCG/HR) 12 MCG/HR    Place 1 patch (12.5 mcg total) onto the skin every 3 (three) days.  Previous Medications   ASPIRIN 81 MG TABLET    Take 81 mg by mouth daily.     ATORVASTATIN (LIPITOR) 80 MG TABLET    TAKE 1 TABLET BY MOUTH DAILY AT 6 PM.   BUPROPION (WELLBUTRIN XL) 150 MG 24 HR TABLET    TAKE 1 TABLET BY MOUTH DAILY.   CHOLECALCIFEROL (VITAMIN D3) 10000 UNITS CAPSULE    Take 10,000 Units by mouth daily.   CITALOPRAM (CELEXA) 40 MG TABLET    TAKE 1 TABLET BY MOUTH DAILY.   HYDROCODONE-ACETAMINOPHEN (NORCO) 10-325 MG TABLET    TAKE 1 TABLET BY MOUTH EVERY 8 HOURS.   MAGNESIUM OXIDE 500 MG TABS    Take 2 tablets by mouth daily.   OMEPRAZOLE MAGNESIUM 20.6 (20 BASE) MG CPDR    Take 1-2 tablets by mouth daily.   POTASSIUM 99 MG TABS    Take 1 tablet by mouth daily.  Modified Medications   No medications on file  Discontinued Medications   No medications on file

## 2015-10-15 DIAGNOSIS — E538 Deficiency of other specified B group vitamins: Secondary | ICD-10-CM | POA: Insufficient documentation

## 2015-10-15 DIAGNOSIS — E559 Vitamin D deficiency, unspecified: Secondary | ICD-10-CM | POA: Insufficient documentation

## 2015-11-07 ENCOUNTER — Other Ambulatory Visit: Payer: Self-pay

## 2015-11-07 MED ORDER — HYDROCODONE-ACETAMINOPHEN 10-325 MG PO TABS: ORAL_TABLET | ORAL | 0 refills | Status: DC

## 2015-11-07 NOTE — Telephone Encounter (Signed)
V/M left requesting rx for hydrocodone apap. Call when ready for pick up. Pt will need to pick up rx by 11/08/15. Dr Lorelei Pont out of office 11/07/15 and 11/08/15.  rx printed # 90 on 10/09/15 and last seen for annual on 10/14/15. Please advise.

## 2015-11-07 NOTE — Telephone Encounter (Signed)
Mrs. Truong notified that Matthew Eaton's prescription is ready to be picked up at the front desk.

## 2015-11-07 NOTE — Telephone Encounter (Signed)
No red flags.  pt also on fentanyl started at last OV.   PER FYI tab: 10/10/15 controlled substance contract signed, uds sample given,  UDS not ran-Will need to collect again at next Rx pick up.

## 2015-11-08 ENCOUNTER — Encounter: Payer: Self-pay | Admitting: Family Medicine

## 2015-11-09 ENCOUNTER — Other Ambulatory Visit: Payer: Self-pay | Admitting: Family Medicine

## 2015-11-13 ENCOUNTER — Ambulatory Visit (INDEPENDENT_AMBULATORY_CARE_PROVIDER_SITE_OTHER): Payer: BC Managed Care – PPO | Admitting: Family Medicine

## 2015-11-13 ENCOUNTER — Encounter: Payer: Self-pay | Admitting: Family Medicine

## 2015-11-13 VITALS — BP 106/80 | HR 82 | Temp 98.2°F | Ht 68.5 in | Wt 228.8 lb

## 2015-11-13 DIAGNOSIS — G8929 Other chronic pain: Secondary | ICD-10-CM | POA: Diagnosis not present

## 2015-11-13 DIAGNOSIS — M5030 Other cervical disc degeneration, unspecified cervical region: Secondary | ICD-10-CM

## 2015-11-13 DIAGNOSIS — M542 Cervicalgia: Secondary | ICD-10-CM

## 2015-11-13 MED ORDER — FENTANYL 25 MCG/HR TD PT72: 25.0000 ug | MEDICATED_PATCH | TRANSDERMAL | 0 refills | Status: DC

## 2015-11-13 NOTE — Progress Notes (Signed)
Dr. Frederico Hamman T. Xai Frerking, MD, [facility] Medicine [place], [address] Phone: [phone] Fax: [phone]  11/13/2015  Patient: Matthew Eaton, MRN: [id number], DOB: 09/25/1960, 55 y.o.  Primary Physician:  Owens Loffler, MD   Chief Complaint  Patient presents with  . Follow-up   Subjective:   Matthew Eaton is a 55 y.o. very pleasant male patient who presents with the following:  With fentanyl patch, still needing to take PO Norco  Recently started to get some blurred vision.  His low-dose fentanyl patch is really not helping him all that much. He is still needed to take his Norco basically 3 times a day every day.  Past Medical History, Surgical History, Social History, Family History, Problem List, Medications, and Allergies have been reviewed and updated if relevant.  Patient Active Problem List   Diagnosis Date Noted  . Vitamin D deficiency 10/15/2015  . Vitamin B12 deficiency 10/15/2015  . Family hx of ALS (amyotrophic lateral sclerosis) 05/27/2013  . Major depressive disorder, recurrent episode, moderate ([place]) 05/27/2013  . Obesity (BMI 30-39.9) 05/22/2013  . Insomnia 05/20/2012  . Chronic neck pain 05/20/2012  . Hiatal hernia 01/05/2011  . [place] DISEASE, CERVICAL 01/22/2010  . TOBACCO ABUSE, HX OF 01/22/2010  . Generalized anxiety disorder 03/06/2009  . Hypogonadism in male 12/19/2008  . Hyperlipidemia 11/15/2008  . GERD 11/15/2008  . Fatigue 11/14/2008    Past Medical History:  Diagnosis Date  . Chronic neck pain 05/20/2012  . Family hx of ALS (amyotrophic lateral sclerosis) 05/27/2013  . GERD (gastroesophageal reflux disease)   . Hiatal hernia   . HLD (hyperlipidemia)   . Major depressive disorder, recurrent episode, moderate ([place]) 05/27/2013  . Neck pain    cervical spine with herniation  . Tobacco abuse   . Wrist fracture, left     Past Surgical History:  Procedure Laterality Date  .  NECK SURGERY     x2- 2010, 2011    Social History   Social History  . Marital status: Married    Spouse name: N/A  . Number of children: N/A  . Years of education: N/A   Occupational History  . Not on file.   Social History Main Topics  . Smoking status: Former Smoker    Years: 30.00  . Smokeless tobacco: Never Used  . Alcohol use No  . Drug use: No  . Sexual activity: Yes    Partners: Female   Other Topics Concern  . Not on file   Social History Narrative  . No narrative on file    Family History  Problem Relation Age of Onset  . Colon cancer Neg Hx   . Esophageal cancer Neg Hx   . Rectal cancer Neg Hx   . Stomach cancer Neg Hx   . ALS Mother   . ALS Brother   . ALS Maternal Uncle     Allergies  Allergen Reactions  . Methocarbamol Shortness Of Breath  . Gabapentin     Medication list reviewed and updated in full in [place].   GEN: No acute illnesses, no fevers, chills. GI: No n/v/d, eating normally Pulm: No SOB Interactive and getting along well at home.  Otherwise, ROS is as per the HPI.  Objective:   BP 106/80   Pulse 82   Temp 98.2 F (36.8 C) (Oral)   Ht 5' 8.5" (1.74 m)   Wt 228 lb 12 oz (103.8 kg)  BMI 34.28 kg/m   GEN: WDWN, NAD, Non-toxic, A & O x 3 HEENT: Atraumatic, Normocephalic. Neck supple. No masses, No LAD. Ears and Nose: No external deformity. CV: RRR, No M/G/R. No JVD. No thrill. No extra heart sounds. PULM: CTA B, no wheezes, crackles, rhonchi. No retractions. No resp. distress. No accessory muscle use. EXTR: No c/c/e NEURO Normal gait.  PSYCH: Normally interactive. Conversant. Not depressed or anxious appearing.  Calm demeanor.   Laboratory and Imaging Data:  Assessment and Plan:   Chronic neck pain  DISC DISEASE, CERVICAL  Increase basal pain med dose to 25 mcg patch O/w he is doing reasonably ok  Follow-up: 6-8 weeks  Modified Medications   Modified Medication Previous Medication   FENTANYL  (DURAGESIC - DOSED MCG/HR) 25 MCG/HR PATCH fentaNYL (DURAGESIC - DOSED MCG/HR) 12 MCG/HR      Place 1 patch (25 mcg total) onto the skin every 3 (three) days.    Place 1 patch (12.5 mcg total) onto the skin every 3 (three) days.   No orders of the defined types were placed in this encounter.   Signed,  Maud Deed. Roxan Yamamoto, MD   Patient's Medications  New Prescriptions   No medications on file  Previous Medications   ASPIRIN 81 MG TABLET    Take 81 mg by mouth daily.     ATORVASTATIN (LIPITOR) 80 MG TABLET    TAKE 1 TABLET BY MOUTH DAILY AT 6 PM.   BUPROPION (WELLBUTRIN XL) 150 MG 24 HR TABLET    TAKE 1 TABLET BY MOUTH DAILY.   CHOLECALCIFEROL (VITAMIN D3) 10000 UNITS CAPSULE    Take 10,000 Units by mouth daily.   CITALOPRAM (CELEXA) 40 MG TABLET    TAKE 1 TABLET BY MOUTH DAILY.   HYDROCODONE-ACETAMINOPHEN (NORCO) 10-325 MG TABLET    TAKE 1 TABLET BY MOUTH EVERY 8 HOURS.   MAGNESIUM OXIDE 500 MG TABS    Take 2 tablets by mouth daily.   OMEPRAZOLE MAGNESIUM 20.6 (20 BASE) MG CPDR    Take 1-2 tablets by mouth daily.   POTASSIUM 99 MG TABS    Take 1 tablet by mouth daily.  Modified Medications   Modified Medication Previous Medication   FENTANYL (DURAGESIC - DOSED MCG/HR) 25 MCG/HR PATCH fentaNYL (DURAGESIC - DOSED MCG/HR) 12 MCG/HR      Place 1 patch (25 mcg total) onto the skin every 3 (three) days.    Place 1 patch (12.5 mcg total) onto the skin every 3 (three) days.  Discontinued Medications   No medications on file

## 2015-11-13 NOTE — Progress Notes (Signed)
Pre visit review using our clinic review tool, if applicable. No additional management support is needed unless otherwise documented below in the visit note. 

## 2015-11-25 ENCOUNTER — Encounter: Payer: Self-pay | Admitting: Family Medicine

## 2015-11-25 ENCOUNTER — Other Ambulatory Visit: Payer: Self-pay | Admitting: Family Medicine

## 2015-12-02 ENCOUNTER — Other Ambulatory Visit: Payer: Self-pay

## 2015-12-02 MED ORDER — HYDROCODONE-ACETAMINOPHEN 10-325 MG PO TABS: ORAL_TABLET | ORAL | 0 refills | Status: DC

## 2015-12-02 NOTE — Telephone Encounter (Signed)
V/M left requesting rx hydrocodone apap. Call when ready for pick up. Last printed # 90 on 11/07/15. Last seen 11/13/15.

## 2015-12-02 NOTE — Telephone Encounter (Signed)
Mrs. Starn notified that Kellon's prescription is ready to be picked up at the front desk.

## 2015-12-07 ENCOUNTER — Other Ambulatory Visit: Payer: Self-pay | Admitting: Family Medicine

## 2015-12-25 ENCOUNTER — Encounter: Payer: Self-pay | Admitting: Family Medicine

## 2015-12-25 ENCOUNTER — Ambulatory Visit (INDEPENDENT_AMBULATORY_CARE_PROVIDER_SITE_OTHER): Payer: BC Managed Care – PPO | Admitting: Family Medicine

## 2015-12-25 VITALS — BP 120/78 | HR 79 | Temp 97.6°F | Wt 231.0 lb

## 2015-12-25 DIAGNOSIS — G8929 Other chronic pain: Secondary | ICD-10-CM

## 2015-12-25 DIAGNOSIS — M542 Cervicalgia: Secondary | ICD-10-CM | POA: Diagnosis not present

## 2015-12-25 MED ORDER — OMEPRAZOLE 20 MG PO CPDR: 20.0000 mg | DELAYED_RELEASE_CAPSULE | Freq: Every day | ORAL | 3 refills | Status: DC

## 2015-12-25 MED ORDER — HYDROCODONE-ACETAMINOPHEN 10-325 MG PO TABS: 1.0000 | ORAL_TABLET | Freq: Four times a day (QID) | ORAL | 0 refills | Status: DC | PRN

## 2015-12-25 NOTE — Progress Notes (Signed)
Dr. Frederico Hamman T. Sharell Hilmer, MD, [facility] and [facility] Medicine [place], [address] Phone: [phone] Fax: [phone]  12/25/2015  Patient: Matthew Eaton, MRN: [id number], DOB: 1960/07/25, 55 y.o.  Primary Physician:  Owens Loffler, MD   Chief Complaint  Patient presents with  . Follow Up Neck Pain   Subjective:   Matthew Eaton is a 55 y.o. very pleasant male patient who presents with the following:  Pain is pretty constant and pain in the left arm Neck pain is constant. We increased the patient's panel patch to 25 micrograms. He does really not feeling any benefit from this at all compared to his Norco that he uses at his baseline medication.  12/07/2015 Last OV with Owens Loffler, MD  With fentanyl patch, still needing to take PO Norco  Recently started to get some blurred vision.  His low-dose fentanyl patch is really not helping him all that much. He is still needed to take his Norco basically 3 times a day every day.  Past Medical History, Surgical History, Social History, Family History, Problem List, Medications, and Allergies have been reviewed and updated if relevant.  Patient Active Problem List   Diagnosis Date Noted  . Vitamin D deficiency 10/15/2015  . Vitamin B12 deficiency 10/15/2015  . Family hx of ALS (amyotrophic lateral sclerosis) 05/27/2013  . Major depressive disorder, recurrent episode, moderate ([place]) 05/27/2013  . Obesity (BMI 30-39.9) 05/22/2013  . Insomnia 05/20/2012  . Chronic neck pain 05/20/2012  . Hiatal hernia 01/05/2011  . [place] DISEASE, CERVICAL 01/22/2010  . TOBACCO ABUSE, HX OF 01/22/2010  . Generalized anxiety disorder 03/06/2009  . Hypogonadism in male 12/19/2008  . Hyperlipidemia 11/15/2008  . GERD 11/15/2008  . Fatigue 11/14/2008    Past Medical History:  Diagnosis Date  . Chronic neck pain 05/20/2012  . Family hx of ALS (amyotrophic lateral sclerosis) 05/27/2013  . GERD  (gastroesophageal reflux disease)   . Hiatal hernia   . HLD (hyperlipidemia)   . Major depressive disorder, recurrent episode, moderate ([place]) 05/27/2013  . Neck pain    cervical spine with herniation  . Tobacco abuse   . Wrist fracture, left     Past Surgical History:  Procedure Laterality Date  . NECK SURGERY     x2- 2010, 2011    Social History   Social History  . Marital status: Married    Spouse name: N/A  . Number of children: N/A  . Years of education: N/A   Occupational History  . Not on file.   Social History Main Topics  . Smoking status: Former Smoker    Years: 30.00  . Smokeless tobacco: Never Used  . Alcohol use No  . Drug use: No  . Sexual activity: Yes    Partners: Female   Other Topics Concern  . Not on file   Social History Narrative  . No narrative on file    Family History  Problem Relation Age of Onset  . Colon cancer Neg Hx   . Esophageal cancer Neg Hx   . Rectal cancer Neg Hx   . Stomach cancer Neg Hx   . ALS Mother   . ALS Brother   . ALS Maternal Uncle     Allergies  Allergen Reactions  . Methocarbamol Shortness Of Breath  . Gabapentin     Medication list reviewed and updated in full in [place].   GEN: No acute illnesses, no fevers, chills. GI:  No n/v/d, eating normally Pulm: No SOB Interactive and getting along well at home.  Otherwise, ROS is as per the HPI.  Objective:   BP 120/78   Pulse 79   Temp 97.6 F (36.4 C) (Oral)   Wt 231 lb (104.8 kg)   SpO2 96%   BMI 34.61 kg/m   GEN: WDWN, NAD, Non-toxic, A & O x 3 HEENT: Atraumatic, Normocephalic. Neck supple. No masses, No LAD. Ears and Nose: No external deformity. CV: RRR, No M/G/R. No JVD. No thrill. No extra heart sounds. PULM: CTA B, no wheezes, crackles, rhonchi. No retractions. No resp. distress. No accessory muscle use. EXTR: No c/c/e NEURO Normal gait.  PSYCH: Normally interactive. Conversant. Not depressed or anxious appearing.  Calm  demeanor.   Laboratory and Imaging Data:  Assessment and Plan:   Chronic neck pain  Discontinue fentanyl. At this point I don't feel comfortable increasing above 25, and the patient does not seem to be getting any benefit from this.  He does sometimes use up to 4 tablets a day, so I'll increase his number of tablets to 120. Recent drug screens have been appropriate.  Follow-up: No Follow-up on file.  Meds ordered this encounter  Medications  . HYDROcodone-acetaminophen (NORCO) 10-325 MG tablet    Sig: Take 1 tablet by mouth every 6 (six) hours as needed for severe pain.    Dispense:  120 tablet    Refill:  0  . omeprazole (PRILOSEC) 20 MG capsule    Sig: Take 1 capsule (20 mg total) by mouth daily.    Dispense:  90 capsule    Refill:  3   Medications Discontinued During This Encounter  Medication Reason  . fentaNYL (DURAGESIC - DOSED MCG/HR) 25 MCG/HR patch   . HYDROcodone-acetaminophen (NORCO) 10-325 MG tablet Reorder   No orders of the defined types were placed in this encounter.   Signed,  Maud Deed. Leanne Sisler, MD     Medication List       Accurate as of 12/25/15  1:27 PM. Always use your most recent med list.          aspirin 81 MG tablet Take 81 mg by mouth daily.   atorvastatin 80 MG tablet Commonly known as:  LIPITOR TAKE 1 TABLET BY MOUTH DAILY AT 6 PM.   buPROPion 150 MG 24 hr tablet Commonly known as:  WELLBUTRIN XL TAKE 1 TABLET BY MOUTH DAILY.   citalopram 40 MG tablet Commonly known as:  CELEXA TAKE 1 TABLET BY MOUTH DAILY.   HYDROcodone-acetaminophen 10-325 MG tablet Commonly known as:  NORCO Take 1 tablet by mouth every 6 (six) hours as needed for severe pain.   Magnesium Oxide 500 MG Tabs Take 2 tablets by mouth daily.   omeprazole 20 MG capsule Commonly known as:  PRILOSEC Take 1 capsule (20 mg total) by mouth daily.   Omeprazole Magnesium 20.6 (20 Base) MG Cpdr Take 1-2 tablets by mouth daily.   Potassium 99 MG Tabs Take 1  tablet by mouth daily.   Vitamin D3 10000 units capsule Take 10,000 Units by mouth daily.

## 2016-01-04 ENCOUNTER — Other Ambulatory Visit: Payer: Self-pay | Admitting: Family Medicine

## 2016-01-04 NOTE — Telephone Encounter (Signed)
Dr. B, can you please assist in my absence.

## 2016-01-07 NOTE — Telephone Encounter (Signed)
Left message for Mr. Flippo that his prescription is ready to be picked up at the front desk. 

## 2016-01-20 ENCOUNTER — Telehealth: Payer: Self-pay | Admitting: Family Medicine

## 2016-01-20 MED ORDER — CLONAZEPAM 0.5 MG PO TABS: 0.5000 mg | ORAL_TABLET | Freq: Two times a day (BID) | ORAL | 2 refills | Status: DC | PRN

## 2016-01-20 NOTE — Telephone Encounter (Signed)
Clonazepam called into [facility].  [facility] notified.

## 2016-01-20 NOTE — Telephone Encounter (Signed)
Received word from wife, still having anxiety despite celexa and wellbutrin. He has taken a benzo before.   He is going to get into psychiatry.  Call in: Klonopin 0.5 mg, 1 po bid, #60, 2 ref

## 2016-01-23 ENCOUNTER — Other Ambulatory Visit: Payer: Self-pay | Admitting: Family Medicine

## 2016-02-24 ENCOUNTER — Other Ambulatory Visit: Payer: Self-pay

## 2016-02-24 NOTE — Telephone Encounter (Signed)
Pt left note at front desk requesting rx hydrocodone apap. Call when ready for pick up. Last printed # 120 on 01/23/16 and pt last seen 12/25/15.

## 2016-02-26 MED ORDER — HYDROCODONE-ACETAMINOPHEN 10-325 MG PO TABS: ORAL_TABLET | ORAL | 0 refills | Status: DC

## 2016-02-26 NOTE — Telephone Encounter (Signed)
Left message for Matthew Eaton that his prescription is ready to be picked up at the front desk. 

## 2016-03-31 ENCOUNTER — Other Ambulatory Visit: Payer: Self-pay

## 2016-03-31 NOTE — Telephone Encounter (Signed)
Pt left v/m requesting rx hydrocodone apap. Call when ready for pick up. rx last printed # 120 on 02/26/16.last seen 12/25/15

## 2016-04-01 MED ORDER — HYDROCODONE-ACETAMINOPHEN 10-325 MG PO TABS: ORAL_TABLET | ORAL | 0 refills | Status: DC

## 2016-04-01 NOTE — Telephone Encounter (Signed)
Left message for Matthew Eaton that his prescription is ready to be picked up at the front desk. 

## 2016-04-14 ENCOUNTER — Other Ambulatory Visit: Payer: Self-pay | Admitting: Family Medicine

## 2016-04-15 NOTE — Telephone Encounter (Signed)
Clonazepam called into [facility] - [place], [place] - [address] Phone: [phone]  

## 2016-04-15 NOTE — Telephone Encounter (Signed)
Last office visit 12/25/2015.  Last refilled 01/20/2016 for #60 with 2 refills. Ok to refill?

## 2016-04-15 NOTE — Telephone Encounter (Signed)
Ok to refill 60, 3 ref 

## 2016-04-16 ENCOUNTER — Other Ambulatory Visit: Payer: Self-pay | Admitting: Family Medicine

## 2016-04-17 ENCOUNTER — Other Ambulatory Visit: Payer: Self-pay | Admitting: Family Medicine

## 2016-04-28 ENCOUNTER — Other Ambulatory Visit: Payer: Self-pay

## 2016-04-28 NOTE — Telephone Encounter (Signed)
V/M left for rx hydrocodone apap. Call when ready for pick up. Last printed # 120 on 04/01/16; last seen 12/25/15.

## 2016-04-29 MED ORDER — HYDROCODONE-ACETAMINOPHEN 10-325 MG PO TABS: ORAL_TABLET | ORAL | 0 refills | Status: DC

## 2016-04-29 NOTE — Telephone Encounter (Signed)
Mrs. Vanover notified by telephone that Matthew Eaton's prescription is ready to be picked up at the front desk.

## 2016-05-04 ENCOUNTER — Other Ambulatory Visit: Payer: Self-pay | Admitting: *Deleted

## 2016-05-04 MED ORDER — OMEPRAZOLE 20 MG PO CPDR: 20.0000 mg | DELAYED_RELEASE_CAPSULE | Freq: Two times a day (BID) | ORAL | 3 refills | Status: DC

## 2016-05-04 NOTE — Telephone Encounter (Signed)
Received fax from [facility] that states patient says does is two a day.  Ok to change?

## 2016-05-29 ENCOUNTER — Encounter: Payer: Self-pay | Admitting: Family Medicine

## 2016-05-29 ENCOUNTER — Other Ambulatory Visit: Payer: Self-pay

## 2016-05-29 ENCOUNTER — Telehealth: Payer: Self-pay | Admitting: *Deleted

## 2016-05-29 NOTE — Telephone Encounter (Signed)
Appointment scheduled 06/03/2016 at 2:45 pm with Dr. Lorelei Pont.

## 2016-05-29 NOTE — Telephone Encounter (Signed)
Mrs. Worrell was in office today with grandson and wanted me to let Dr. Lorelei Pont know that Mr. Shellhammer has fallen 3 times in the last month and a half.  She states when he has fallen he states that his legs feel like jelly.  Advised I would send a note to Dr. Lorelei Pont to make him aware.

## 2016-05-29 NOTE — Telephone Encounter (Signed)
He has enough going on that it is probably not a bad idea for him to follow-up in the next month to see if we can come up an obvious cause.

## 2016-05-29 NOTE — Telephone Encounter (Signed)
V/m left requesting rx for hydrocodone apap. Call when ready for pick up. Pt will be out of med on 06/03/16.last printed # 120 on 04/29/16. Last seen 12/25/15.

## 2016-06-01 MED ORDER — HYDROCODONE-ACETAMINOPHEN 10-325 MG PO TABS: ORAL_TABLET | ORAL | 0 refills | Status: DC

## 2016-06-01 NOTE — Telephone Encounter (Signed)
Joelene Millin notified that Kyel's prescription is ready to be picked up at the front desk.

## 2016-06-03 ENCOUNTER — Encounter: Payer: Self-pay | Admitting: Family Medicine

## 2016-06-03 ENCOUNTER — Ambulatory Visit (INDEPENDENT_AMBULATORY_CARE_PROVIDER_SITE_OTHER): Payer: BC Managed Care – PPO | Admitting: Family Medicine

## 2016-06-03 VITALS — BP 130/78 | HR 74 | Temp 98.4°F | Ht 68.5 in | Wt 236.5 lb

## 2016-06-03 DIAGNOSIS — M545 Low back pain: Secondary | ICD-10-CM | POA: Diagnosis not present

## 2016-06-03 DIAGNOSIS — G8929 Other chronic pain: Secondary | ICD-10-CM | POA: Diagnosis not present

## 2016-06-03 DIAGNOSIS — R269 Unspecified abnormalities of gait and mobility: Secondary | ICD-10-CM

## 2016-06-03 DIAGNOSIS — M542 Cervicalgia: Secondary | ICD-10-CM | POA: Diagnosis not present

## 2016-06-03 DIAGNOSIS — R9089 Other abnormal findings on diagnostic imaging of central nervous system: Secondary | ICD-10-CM

## 2016-06-03 DIAGNOSIS — R296 Repeated falls: Secondary | ICD-10-CM | POA: Diagnosis not present

## 2016-06-03 DIAGNOSIS — E538 Deficiency of other specified B group vitamins: Secondary | ICD-10-CM

## 2016-06-03 DIAGNOSIS — Z820 Family history of epilepsy and other diseases of the nervous system: Secondary | ICD-10-CM

## 2016-06-03 DIAGNOSIS — E119 Type 2 diabetes mellitus without complications: Secondary | ICD-10-CM | POA: Diagnosis not present

## 2016-06-03 LAB — CBC WITH DIFFERENTIAL/PLATELET
BASOS ABS: 0 10*3/uL (ref 0.0–0.1)
Basophils Relative: 0.7 % (ref 0.0–3.0)
EOS ABS: 0.1 10*3/uL (ref 0.0–0.7)
Eosinophils Relative: 2.2 % (ref 0.0–5.0)
HCT: 39.5 % (ref 39.0–52.0)
Hemoglobin: 13.1 g/dL (ref 13.0–17.0)
LYMPHS ABS: 1.7 10*3/uL (ref 0.7–4.0)
Lymphocytes Relative: 28.6 % (ref 12.0–46.0)
MCHC: 33.1 g/dL (ref 30.0–36.0)
MCV: 86.1 fl (ref 78.0–100.0)
MONO ABS: 0.6 10*3/uL (ref 0.1–1.0)
Monocytes Relative: 10.4 % (ref 3.0–12.0)
Neutro Abs: 3.5 10*3/uL (ref 1.4–7.7)
Neutrophils Relative %: 58.1 % (ref 43.0–77.0)
Platelets: 238 10*3/uL (ref 150.0–400.0)
RBC: 4.58 Mil/uL (ref 4.22–5.81)
RDW: 15 % (ref 11.5–15.5)
WBC: 6.1 10*3/uL (ref 4.0–10.5)

## 2016-06-03 LAB — HEPATIC FUNCTION PANEL
ALT: 60 U/L — AB (ref 0–53)
AST: 43 U/L — ABNORMAL HIGH (ref 0–37)
Albumin: 4 g/dL (ref 3.5–5.2)
Alkaline Phosphatase: 95 U/L (ref 39–117)
BILIRUBIN DIRECT: 0.1 mg/dL (ref 0.0–0.3)
BILIRUBIN TOTAL: 0.3 mg/dL (ref 0.2–1.2)
Total Protein: 6.8 g/dL (ref 6.0–8.3)

## 2016-06-03 LAB — BASIC METABOLIC PANEL
BUN: 14 mg/dL (ref 6–23)
CALCIUM: 9.6 mg/dL (ref 8.4–10.5)
CHLORIDE: 106 meq/L (ref 96–112)
CO2: 29 meq/L (ref 19–32)
Creatinine, Ser: 1.13 mg/dL (ref 0.40–1.50)
GFR: 71.45 mL/min (ref >60.00)
Glucose, Bld: 105 mg/dL — ABNORMAL HIGH (ref 70–99)
POTASSIUM: 4.1 meq/L (ref 3.5–5.1)
SODIUM: 141 meq/L (ref 135–145)

## 2016-06-03 LAB — HEMOGLOBIN A1C: HEMOGLOBIN A1C: 6.7 % — AB (ref 4.6–6.5)

## 2016-06-03 LAB — VITAMIN B12: Vitamin B-12: 515 pg/mL (ref 211–911)

## 2016-06-03 LAB — TSH: TSH: 1.45 u[IU]/mL (ref 0.35–4.50)

## 2016-06-03 MED ORDER — DIAZEPAM 5 MG PO TABS: ORAL_TABLET | ORAL | 0 refills | Status: DC

## 2016-06-03 NOTE — Progress Notes (Addendum)
Dr. Frederico Hamman T. Broxton Broady, MD, [facility] [place], [address] Phone: [phone] Fax: [phone]  06/03/2016  Patient: Matthew Eaton, MRN: [id number], DOB: 1960/08/12, 56 y.o.  Primary Physician:  Owens Loffler, MD   Chief Complaint  Patient presents with  . Fall   Subjective:   Matthew Eaton is a 56 y.o. very pleasant male patient who presents with the following:  [place] a few times, fell at [place] and a pair of pants fell off, legs fell like rubber bands. Another time, was walking across the road and came across the woods.  Legs got really tired.   Today - hip hurt a little bit. When he says his hip, he points to his back and his buttocks region.  He is not having any significant groin pain. Still has problems with his arms going to sleep.   He has full to 4 times in the last 1 month.  His wife is here today accompanied him.  She is concerned by his recent falls.  Never felt dizzy at all.   He also continues to be depressed somewhat.  He does think is a little bit better after weeks increased and added some wellbutrin to his antidepressant meds.  He is ill still in chronic pain essentially all the time.  He is at 2 cervical spine surgeries, and has not had any relief from these and he is basically in pain all the time.  He is taking Norco 10 now, and we tried to put him on a fentanyl patch earlier in the year, and did not help his symptoms at all.  L thigh tingling  Heel to toe walking abnormal Finger nose mildly abnormal  Past Medical History, Surgical History, Social History, Family History, Problem List, Medications, and Allergies have been reviewed and updated if relevant.  Patient Active Problem List   Diagnosis Date Noted  . Vitamin D deficiency 10/15/2015  . Vitamin B12 deficiency 10/15/2015  . Family hx of ALS (amyotrophic lateral sclerosis) 05/27/2013  . Major depressive disorder,  recurrent episode, moderate ([place]) 05/27/2013  . Obesity (BMI 30-39.9) 05/22/2013  . Insomnia 05/20/2012  . Chronic neck pain 05/20/2012  . Hiatal hernia 01/05/2011  . [place] DISEASE, CERVICAL 01/22/2010  . TOBACCO ABUSE, HX OF 01/22/2010  . Generalized anxiety disorder 03/06/2009  . Hypogonadism in male 12/19/2008  . Hyperlipidemia 11/15/2008  . GERD 11/15/2008    Past Medical History:  Diagnosis Date  . Chronic neck pain 05/20/2012  . Family hx of ALS (amyotrophic lateral sclerosis) 05/27/2013  . GERD (gastroesophageal reflux disease)   . Hiatal hernia   . HLD (hyperlipidemia)   . Major depressive disorder, recurrent episode, moderate ([place]) 05/27/2013  . Neck pain    cervical spine with herniation  . Tobacco abuse   . Wrist fracture, left     Past Surgical History:  Procedure Laterality Date  . NECK SURGERY     x2- 2010, 2011    Social History   Social History  . Marital status: Married    Spouse name: N/A  . Number of children: N/A  . Years of education: N/A   Occupational History  . Not on file.   Social History Main Topics  . Smoking status: Former Smoker    Years: 30.00  . Smokeless tobacco: Never Used  . Alcohol use No  . Drug use: No  . Sexual activity: Yes    Partners: Female  Other Topics Concern  . Not on file   Social History Narrative  . No narrative on file    Family History  Problem Relation Age of Onset  . Colon cancer Neg Hx   . Esophageal cancer Neg Hx   . Rectal cancer Neg Hx   . Stomach cancer Neg Hx   . ALS Mother   . ALS Brother   . ALS Maternal Uncle     Allergies  Allergen Reactions  . Methocarbamol Shortness Of Breath  . Gabapentin     Medication list reviewed and updated in full in [place].   GEN: No acute illnesses, no fevers, chills. GI: No n/v/d, eating normally Pulm: No SOB Neurological as above Interactive and getting along well at home.  Otherwise, ROS is as per the HPI.  Objective:   BP  130/78   Pulse 74   Temp 98.4 F (36.9 C) (Oral)   Ht 5' 8.5" (1.74 m)   Wt 236 lb 8 oz (107.3 kg)   SpO2 93%   BMI 35.44 kg/m   GEN: WDWN, NAD, Non-toxic, A & O x 3 HEENT: Atraumatic, Normocephalic. Neck supple. No masses, No LAD. Ears and Nose: No external deformity. CV: RRR, No M/G/R. No JVD. No thrill. No extra heart sounds. PULM: CTA B, no wheezes, crackles, rhonchi. No retractions. No resp. distress. No accessory muscle use. EXTR: No c/c/e  PSYCH: Normally interactive. Conversant. Not depressed or anxious appearing.  Calm demeanor.   Grossly full range of motion at the hip in abduction as well as rotational movements  Neuro: CN 2-12 grossly intact. PERRLA. EOMI. Sensation intact except slightly diminished on the L thigh. Str 5/5 all extremities. DTR 2+. No clonus. A and o x 4. Romberg neg. Finger nose mildly abnormal. Heel to toe walking is abnormal and decidedly off balance.  Laboratory and Imaging Data: Results for orders placed or performed in visit on 06/03/16  CBC with Differential/Platelet  Result Value Ref Range   WBC 6.1 4.0 - 10.5 K/uL   RBC 4.58 4.22 - 5.81 Mil/uL   Hemoglobin 13.1 13.0 - 17.0 g/dL   HCT 39.5 39.0 - 52.0 %   MCV 86.1 78.0 - 100.0 fl   MCHC 33.1 30.0 - 36.0 g/dL   RDW 15.0 11.5 - 15.5 %   Platelets 238.0 150.0 - 400.0 K/uL   Neutrophils Relative % 58.1 43.0 - 77.0 %   Lymphocytes Relative 28.6 12.0 - 46.0 %   Monocytes Relative 10.4 3.0 - 12.0 %   Eosinophils Relative 2.2 0.0 - 5.0 %   Basophils Relative 0.7 0.0 - 3.0 %   Neutro Abs 3.5 1.4 - 7.7 K/uL   Lymphs Abs 1.7 0.7 - 4.0 K/uL   Monocytes Absolute 0.6 0.1 - 1.0 K/uL   Eosinophils Absolute 0.1 0.0 - 0.7 K/uL   Basophils Absolute 0.0 0.0 - 0.1 K/uL  Basic metabolic panel  Result Value Ref Range   Sodium 141 135 - 145 mEq/L   Potassium 4.1 3.5 - 5.1 mEq/L   Chloride 106 96 - 112 mEq/L   CO2 29 19 - 32 mEq/L   Glucose, Bld 105 (H) 70 - 99 mg/dL   BUN 14 6 - 23 mg/dL   Creatinine,  Ser 1.13 0.40 - 1.50 mg/dL   Calcium 9.6 8.4 - 10.5 mg/dL   GFR 71.45 >60.00 mL/min  Vitamin B12  Result Value Ref Range   Vitamin B-12 515 211 - 911 pg/mL  Hepatic function panel  Result Value Ref Range   Total Bilirubin 0.3 0.2 - 1.2 mg/dL   Bilirubin, Direct 0.1 0.0 - 0.3 mg/dL   Alkaline Phosphatase 95 39 - 117 U/L   AST 43 (H) 0 - 37 U/L   ALT 60 (H) 0 - 53 U/L   Total Protein 6.8 6.0 - 8.3 g/dL   Albumin 4.0 3.5 - 5.2 g/dL  TSH  Result Value Ref Range   TSH 1.45 0.35 - 4.50 uIU/mL  Hemoglobin A1c  Result Value Ref Range   Hgb A1c MFr Bld 6.7 (H) 4.6 - 6.5 %    Mr Jeri Cos Wo Contrast  Result Date: 06/14/2016 CLINICAL DATA:  Frequent falls.  Family history of ALS EXAM: MRI HEAD WITHOUT AND WITH CONTRAST TECHNIQUE: Multiplanar, multiecho pulse sequences of the brain and surrounding structures were obtained without and with intravenous contrast. CONTRAST:  77mL MULTIHANCE GADOBENATE DIMEGLUMINE 529 MG/ML IV SOLN COMPARISON:  None. FINDINGS: Brain: Mild prominence of the lateral ventricles. This may be due to mild atrophy. Negative for acute or chronic infarction. Negative for demyelinating disease. Negative for intracranial hemorrhage or mass lesion. No fluid collection or shift of the midline structures. Normal enhancement postcontrast infusion. Vascular: Normal arterial flow voids.  Normal venous enhancement. Skull and upper cervical spine: Negative Sinuses/Orbits: Paranasal sinuses clear. Distended optic nerve sheaths which may represent increased intracranial pressure. Other: None IMPRESSION: Negative for infarct. Mild ventricular enlargement felt to be due to atrophy. Optic nerve sheaths distended with fluid suggesting increased intracranial pressure. Correlate with fundoscopic exam. Electronically Signed   By: Franchot Gallo M.D.   On: 06/14/2016 14:55   Assessment and Plan:   Falls frequently - Plan: CBC with Differential/Platelet, Basic metabolic panel, Vitamin M84, Hepatic  function panel, TSH, Hemoglobin A1c, MR Brain W Wo Contrast  Family hx of ALS (amyotrophic lateral sclerosis) - Plan: CBC with Differential/Platelet, Basic metabolic panel, Vitamin X32, Hepatic function panel, TSH, Hemoglobin A1c, MR Brain W Wo Contrast  Vitamin B12 deficiency - Plan: Vitamin B12  Gait disturbance  Chronic neck pain  Chronic bilateral low back pain without sciatica  Controlled type 2 diabetes mellitus without complication, without long-term current use of insulin (HCC)  Multiple ongoing issues.  New-onset gait instability with multiple falls in a short time frame.  Both finger-nose testing as well as heel to toe walking abnormal on neurological exam.  Family history of ALS.  Obtain an MRI of the brain with and without contrast to evaluate for neoplasm, demyelinating disease, or other acute pathology.  Ongoing issues with chronic pain.  On lab work today, we have detected that the patient does appear to have new onset diabetes mellitus.  I will have to call and speak to him about this.  Addendum, 06/30/2016. I verbally spoke to the patient. New-onset DM, start metformin XR 500 mg daily and weight loss.   Ongoing balance issues with increased ICP - I would like to consult neurology given all of the above.   Follow-up: based on findings  Meds ordered this encounter  Medications  . diazepam (VALIUM) 5 MG tablet    Sig: 1-2 tabs prior to MRI    Dispense:  2 tablet    Refill:  0   There are no discontinued medications. Orders Placed This Encounter  Procedures  . MR Brain W Wo Contrast  . CBC with Differential/Platelet  . Basic metabolic panel  . Vitamin B12  . Hepatic function panel  . TSH  . Hemoglobin A1c  Signed,  Maud Deed. Jordanna Hendrie, MD   Allergies as of 06/03/2016      Reactions   Methocarbamol Shortness Of Breath   Gabapentin       Medication List       Accurate as of 06/03/16 11:59 PM. Always use your most recent med list.            aspirin 81 MG tablet Take 81 mg by mouth daily.   atorvastatin 80 MG tablet Commonly known as:  LIPITOR TAKE 1 TABLET BY MOUTH DAILY AT 6 PM.   buPROPion 150 MG 24 hr tablet Commonly known as:  WELLBUTRIN XL TAKE 1 TABLET BY MOUTH DAILY.   citalopram 40 MG tablet Commonly known as:  CELEXA TAKE 1 TABLET BY MOUTH DAILY.   clonazePAM 0.5 MG tablet Commonly known as:  KLONOPIN TAKE 1 TABLET BY MOUTH 2 TIMES A DAY AS NEEDED.   diazepam 5 MG tablet Commonly known as:  VALIUM 1-2 tabs prior to MRI   HYDROcodone-acetaminophen 10-325 MG tablet Commonly known as:  NORCO TAKE 1 TABLET BY MOUTH EVERY 6 HOURS AS NEEDED FOR SEVERE PAIN   Magnesium Oxide 500 MG Tabs Take 2 tablets by mouth daily.   omeprazole 20 MG capsule Commonly known as:  PRILOSEC Take 1 capsule (20 mg total) by mouth 2 (two) times daily before a meal.   Omeprazole Magnesium 20.6 (20 Base) MG Cpdr Take 1-2 tablets by mouth daily.   Potassium 99 MG Tabs Take 1 tablet by mouth daily.   Vitamin D3 10000 units capsule Take 10,000 Units by mouth daily.

## 2016-06-03 NOTE — Progress Notes (Signed)
Pre visit review using our clinic review tool, if applicable. No additional management support is needed unless otherwise documented below in the visit note. 

## 2016-06-03 NOTE — Patient Instructions (Signed)

## 2016-06-04 DIAGNOSIS — G8929 Other chronic pain: Secondary | ICD-10-CM | POA: Insufficient documentation

## 2016-06-04 DIAGNOSIS — M545 Low back pain: Secondary | ICD-10-CM

## 2016-06-14 ENCOUNTER — Ambulatory Visit
Admission: RE | Admit: 2016-06-14 | Discharge: 2016-06-14 | Disposition: A | Discharge status: Home / Self Care | Payer: BC Managed Care – PPO | Source: Ambulatory Visit | Attending: Family Medicine | Admitting: Family Medicine

## 2016-06-14 DIAGNOSIS — Z820 Family history of epilepsy and other diseases of the nervous system: Secondary | ICD-10-CM

## 2016-06-14 DIAGNOSIS — R296 Repeated falls: Secondary | ICD-10-CM

## 2016-06-14 MED ORDER — GADOBENATE DIMEGLUMINE 529 MG/ML IV SOLN
20.0000 mL | Freq: Once | INTRAVENOUS | Status: AC | PRN
  Administered 2016-06-14: 20 mL via INTRAVENOUS

## 2016-06-30 MED ORDER — METFORMIN HCL ER 500 MG PO TB24: 500.0000 mg | ORAL_TABLET | Freq: Every day | ORAL | 5 refills | Status: DC

## 2016-06-30 NOTE — Addendum Note (Signed)
Addended by: Owens Loffler on: 06/30/2016 09:08 AM   Modules accepted: Orders

## 2016-07-06 ENCOUNTER — Other Ambulatory Visit: Payer: Self-pay | Admitting: *Deleted

## 2016-07-06 MED ORDER — HYDROCODONE-ACETAMINOPHEN 10-325 MG PO TABS: ORAL_TABLET | ORAL | 0 refills | Status: DC

## 2016-07-06 NOTE — Telephone Encounter (Signed)
Patient's wife (no DPR) left a voicemail requesting a refill on Hydrocodone Last refill 06/01/16 #120 Last office visit 06/03/16

## 2016-07-06 NOTE — Telephone Encounter (Signed)
Indication for chronic opioid: failed spine syndrome Medication and dose: Norco 10 # pills per month: 120 Last UDS date: 11/2015 Pain contract signed (Y/N): y Date narcotic database last reviewed (include red flags): 07/06/16

## 2016-07-06 NOTE — Telephone Encounter (Signed)
Left message for Maudie Mercury, that Taevion's prescription is ready to be picked up at the front desk.

## 2016-08-04 ENCOUNTER — Ambulatory Visit (INDEPENDENT_AMBULATORY_CARE_PROVIDER_SITE_OTHER): Payer: BC Managed Care – PPO | Admitting: Diagnostic Neuroimaging

## 2016-08-04 ENCOUNTER — Encounter: Payer: Self-pay | Admitting: Diagnostic Neuroimaging

## 2016-08-04 ENCOUNTER — Other Ambulatory Visit: Payer: Self-pay | Admitting: Family Medicine

## 2016-08-04 VITALS — BP 125/88 | HR 77 | Ht 68.5 in | Wt 234.4 lb

## 2016-08-04 DIAGNOSIS — M5442 Lumbago with sciatica, left side: Secondary | ICD-10-CM | POA: Diagnosis not present

## 2016-08-04 DIAGNOSIS — H47093 Other disorders of optic nerve, not elsewhere classified, bilateral: Secondary | ICD-10-CM

## 2016-08-04 DIAGNOSIS — R269 Unspecified abnormalities of gait and mobility: Secondary | ICD-10-CM | POA: Diagnosis not present

## 2016-08-04 DIAGNOSIS — M5441 Lumbago with sciatica, right side: Secondary | ICD-10-CM | POA: Diagnosis not present

## 2016-08-04 DIAGNOSIS — G8929 Other chronic pain: Secondary | ICD-10-CM

## 2016-08-04 NOTE — Telephone Encounter (Signed)
Last office visit 06/03/2016.  Last refilled 07/06/2016 for #120 with no refills.  Ok to refill?

## 2016-08-04 NOTE — Patient Instructions (Addendum)
Thank you for coming to see [place] at [facility]. I hope we have been able to provide you high quality care today.  You may receive a patient satisfaction survey over the next few weeks. We would appreciate your feedback and comments so that we may continue to improve ourselves and the health of our patients.  GAIT DIFFICULTY - check MRI lumbar spine (rule out lumbar spinal stenosis; freq falls; leg weakness) - continue diabetes treatment - consider PT and exercise regimen  OPTIC NERVE SHEATH ENLARGEMENT (likely incidental finding not related to gait difficulty; could be related to weight gain) - refer to [facility] ophthalmology - then may consider lumbar puncture   ~~~~~~~~~~~~~~~~~~~~~~~~~~~~~~~~~~~~~~~~~~~~~~~~~~~~~~~~~~~~~~~~~  DR. Arlyce Circle'S GUIDE TO HAPPY AND HEALTHY LIVING These are some of my general health and wellness recommendations. Some of them may apply to you better than others. Please use common sense as you try these suggestions and feel free to ask me any questions.   ACTIVITY/FITNESS Mental, social, emotional and physical stimulation are very important for brain and body health. Try learning a new activity (arts, music, language, sports, games).  Keep moving your body to the best of your abilities. You can do this at home, inside or outside, the park, community center, gym or anywhere you like. Consider a physical therapist or personal trainer to get started. Consider the app Sworkit. Fitness trackers such as smart-watches, smart-phones or Fitbits can help as well.   NUTRITION Eat more plants: colorful vegetables, nuts, seeds and berries.  Eat less sugar, salt, preservatives and processed foods.  Avoid toxins such as cigarettes and alcohol.  Drink water when you are thirsty. Warm water with a slice of lemon is an excellent morning drink to start the day.  Consider these websites for more information The Nutrition Source  (https://www.henry-hernandez.biz/) Precision Nutrition (WindowBlog.ch)   RELAXATION Consider practicing mindfulness meditation or other relaxation techniques such as deep breathing, prayer, yoga, tai chi, massage. See website mindful.org or the apps Headspace or Calm to help get started.   SLEEP Try to get at least 7-8+ hours sleep per day. Regular exercise and reduced caffeine will help you sleep better. Practice good sleep hygeine techniques. See website sleep.org for more information.   PLANNING Prepare estate planning, living will, healthcare POA documents. Sometimes this is best planned with the help of an attorney. Theconversationproject.org and agingwithdignity.org are excellent resources.

## 2016-08-04 NOTE — Progress Notes (Signed)
[facility]  PATIENT: Matthew Eaton DOB: [birth year]  REFERRING CLINICIAN: S [name] HISTORY FROM: patient and wife  REASON FOR VISIT: new consult    HISTORICAL  CHIEF COMPLAINT:  Chief Complaint  Patient presents with  . NP  [place]  . Frequent Falls    4 falls since last 2 mo.  (one stumble down stairs- dog), One off balance, other 2 tired (standing)) Family hx of ALS, mother , brother, mat aunt and uncle..  ABN MRI    HISTORY OF PRESENT ILLNESS:   56 year old male here for evaluation of gait and balance difficulty and abnormal MRI brain.  Patient has history of cervical spine surgery in 2010 2011 and related to neck pain, left shoulder pain and left arm pain. Patient initially had C6-7 discectomy and fusion in 2010 followed by C5-6 discectomy and fusion in 2011. Patient had some balance and weakness problems at that time in between the 2 surgeries but then recovered.  In 2015 patient had progressive low back pain, lower extremity weakness, fatigue, physical and mobility limitations.  Patient has seen orthopedic spine surgery, pain management, PCP, and now sports medicine clinic for evaluation.  In last few months patient having significantly more problems with gait and balance problems.  Patient recently diagnosed with diabetes. Has some numbness in his feet.  Patient had MRI of the brain to evaluate balance and gait difficulties, was found to have enlarged optic nerve sheaths and therefore raise possibility of idiopathic intracranial hypertension. Patient referred here for evaluation of this issue as well. Patient denies any vision issues or new headaches. Patient has had some gradual weight gain over the past 2 years due to sedentary lifestyle, and limitation in mobility due to pain.  Patient also feels somewhat hopeless, depressed, due to inability to work and perform physical activities due to his neck pain and other limitations. He has applied  for Social Security disability but has been denied. He is hoping to be able to get disability or get back to work.    REVIEW OF SYSTEMS: Full 14 system review of systems performed and negative with exception of: Weight gain fatigue blurred vision aching muscles depression anxiety decreased energy headache numbness weakness restless legs.  ALLERGIES: Allergies  Allergen Reactions  . Methocarbamol Shortness Of Breath  . Gabapentin     HOME MEDICATIONS: Outpatient Medications Prior to Visit  Medication Sig Dispense Refill  . aspirin 81 MG tablet Take 81 mg by mouth daily.      Marland Kitchen atorvastatin (LIPITOR) 80 MG tablet TAKE 1 TABLET BY MOUTH DAILY AT 6 PM. 90 tablet 3  . buPROPion (WELLBUTRIN XL) 150 MG 24 hr tablet TAKE 1 TABLET BY MOUTH DAILY. 30 tablet 11  . citalopram (CELEXA) 40 MG tablet TAKE 1 TABLET BY MOUTH DAILY. 30 tablet 6  . clonazePAM (KLONOPIN) 0.5 MG tablet TAKE 1 TABLET BY MOUTH 2 TIMES A DAY AS NEEDED. 60 tablet 3  . HYDROcodone-acetaminophen (NORCO) 10-325 MG tablet TAKE 1 TABLET BY MOUTH EVERY 6 HOURS AS NEEDED FOR SEVERE PAIN 120 tablet 0  . metFORMIN (GLUCOPHAGE-XR) 500 MG 24 hr tablet Take 1 tablet (500 mg total) by mouth daily with breakfast. 30 tablet 5  . Omeprazole Magnesium 20.6 (20 BASE) MG CPDR Take 1-2 tablets by mouth daily.    . Potassium 99 MG TABS Take 1 tablet by mouth daily.    . Cholecalciferol (VITAMIN D3) 10000 units capsule Take 10,000 Units by mouth daily.    . diazepam (  VALIUM) 5 MG tablet 1-2 tabs prior to MRI (Patient not taking: Reported on 08/04/2016) 2 tablet 0  . Magnesium Oxide 500 MG TABS Take 2 tablets by mouth daily.    Marland Kitchen omeprazole (PRILOSEC) 20 MG capsule Take 1 capsule (20 mg total) by mouth 2 (two) times daily before a meal. (Patient not taking: Reported on 08/04/2016) 180 capsule 3   No facility-administered medications prior to visit.     PAST MEDICAL HISTORY: Past Medical History:  Diagnosis Date  . Chronic neck pain 05/20/2012  .  Family hx of ALS (amyotrophic lateral sclerosis) 05/27/2013  . GERD (gastroesophageal reflux disease)   . Hiatal hernia   . HLD (hyperlipidemia)   . Major depressive disorder, recurrent episode, moderate ([place]) 05/27/2013  . Neck pain    cervical spine with herniation  . Tobacco abuse   . Wrist fracture, left     PAST SURGICAL HISTORY: Past Surgical History:  Procedure Laterality Date  . NECK SURGERY     x2- 2010, 2011    FAMILY HISTORY: Family History  Problem Relation Age of Onset  . ALS Mother   . ALS Brother   . ALS Maternal Uncle   . ALS Maternal Aunt   . Colon cancer Neg Hx   . Esophageal cancer Neg Hx   . Rectal cancer Neg Hx   . Stomach cancer Neg Hx     SOCIAL HISTORY:  Social History   Social History  . Marital status: Married    Spouse name: N/A  . Number of children: N/A  . Years of education: N/A   Occupational History  . Not on file.   Social History Main Topics  . Smoking status: Former Smoker    Years: 30.00    Quit date: 02/03/2007  . Smokeless tobacco: Never Used  . Alcohol use No  . Drug use: No  . Sexual activity: Yes    Partners: Female   Other Topics Concern  . Not on file   Social History Narrative   Lives home with wife, Joelene Millin.  Education 12th grade.  Five children.     PHYSICAL EXAM  GENERAL EXAM/CONSTITUTIONAL: Vitals:  Vitals:   08/04/16 1004  BP: 125/88  Pulse: 77  Weight: 234 lb 6.4 oz (106.3 kg)  Height: 5' 8.5" (1.74 m)     Body mass index is 35.12 kg/m.  Visual Acuity Screening   Right eye Left eye Both eyes  Without correction: 20/40 20/70   With correction:        Patient is in no distress; well developed, nourished and groomed; neck is supple  CARDIOVASCULAR:  Examination of carotid arteries is normal; no carotid bruits  Regular rate and rhythm, no murmurs  Examination of peripheral vascular system by observation and palpation is normal  EYES:  Ophthalmoscopic exam of optic discs and  posterior segments is normal; no papilledema or hemorrhages  MUSCULOSKELETAL:  Gait, strength, tone, movements noted in Neurologic exam below  NEUROLOGIC: MENTAL STATUS:  No flowsheet data found.  awake, alert, oriented to person, place and time  recent and remote memory intact  normal attention and concentration  language fluent, comprehension intact, naming intact,   fund of knowledge appropriate  CRANIAL NERVE:   2nd - no papilledema on fundoscopic exam  2nd, 3rd, 4th, 6th - pupils equal and reactive to light, visual fields full to confrontation, extraocular muscles intact, no nystagmus  5th - facial sensation symmetric  7th - facial strength symmetric  8th - hearing  intact  9th - palate elevates symmetrically, uvula midline  11th - shoulder shrug symmetric  12th - tongue protrusion midline  MOTOR:   normal bulk and tone  full strength in the BUE  BLE --> HF 4, KE 4, KF 4, DF 5  SENSORY:   normal and symmetric to light touch, temperature, vibration  EXCEPT DECR VIB IN TOES (< 5 SEC)  PROPRIO INTACT  COORDINATION:   finger-nose-finger, fine finger movements normal  REFLEXES:   deep tendon reflexes --> ARMS 1, RIGHT KNEE 2, LEFT KNEE 1, ANKLES 0  DOWN GOING TOES  GAIT/STATION:   ANTALGIC GAIT; narrow based gait; ABLE TO WALK ON TOES AND HEELS SHORT DISTANCE  ROMBERG NEGATIVE    DIAGNOSTIC DATA (LABS, IMAGING, TESTING) - I reviewed patient records, labs, notes, testing and imaging myself where available.  Lab Results  Component Value Date   WBC 6.1 06/03/2016   HGB 13.1 06/03/2016   HCT 39.5 06/03/2016   MCV 86.1 06/03/2016   PLT 238.0 06/03/2016      Component Value Date/Time   NA 141 06/03/2016 1540   K 4.1 06/03/2016 1540   CL 106 06/03/2016 1540   CO2 29 06/03/2016 1540   GLUCOSE 105 (H) 06/03/2016 1540   BUN 14 06/03/2016 1540   CREATININE 1.13 06/03/2016 1540   CALCIUM 9.6 06/03/2016 1540   PROT 6.8 06/03/2016 1540    ALBUMIN 4.0 06/03/2016 1540   AST 43 (H) 06/03/2016 1540   ALT 60 (H) 06/03/2016 1540   ALKPHOS 95 06/03/2016 1540   BILITOT 0.3 06/03/2016 1540   GFRNONAA >60 01/27/2010 0923   GFRAA  01/27/2010 0923    >60        The eGFR has been calculated using the MDRD equation. This calculation has not been validated in all clinical situations. eGFR's persistently <60 mL/min signify possible Chronic Kidney Disease.   Lab Results  Component Value Date   CHOL 206 (H) 10/10/2015   HDL 41.80 10/10/2015   LDLCALC 95 05/18/2013   LDLDIRECT 96.0 10/10/2015   TRIG 368.0 (H) 10/10/2015   CHOLHDL 5 10/10/2015   Lab Results  Component Value Date   HGBA1C 6.7 (H) 06/03/2016   Lab Results  Component Value Date   VITAMINB12 515 06/03/2016   Lab Results  Component Value Date   TSH 1.45 06/03/2016    02/02/11 cervical spine xray - Stable C5-C6 ACDF with no adverse hardware features.  Previous C6-C7 fusion appears solid.  06/14/16 MRI brain [I reviewed images myself and agree with interpretation. -VRP]  - Negative for infarct. - Mild ventricular enlargement felt to be due to atrophy. Optic nerve sheaths distended with fluid suggesting increased intracranial pressure. Correlate with fundoscopic exam.     ASSESSMENT AND PLAN  56 y.o. year old male here with history of cervical spine surgery 2, chronic neck pain, chronic low back pain, now with progressive gait difficulty and abnormal MRI brain study showing optic nerve sheath enlargement.   I think patient's gait difficulty are related to lumbar radiculopathy versus spinal stenosis, chronic pain, and now possible diabetic neuropathy.  I think the abnormal MRI brain findings are incidental finding and not related to his gait problem. Typically idiopathic intracranial hypertension (pseudotumor cerebri) would also have ringing in the ears, enlarged blind spot, papilledema, headaches. However will proceed with further workup starting with  ophthalmology referral and then possible lumbar puncture.  Ddx of gait difficulty: lumbar radiculopathy, chronic pain, diabetic neuropathy  Ddx of optic nerve sheath  enlargement: pseudotumor cerebri vs incidental finding  1. Gait difficulty   2. Chronic bilateral low back pain with bilateral sciatica   3. Optic nerve asymmetry, bilateral      PLAN:  GAIT DIFFICULTY - check MRI lumbar spine (rule out lumbar spinal stenosis; freq falls; leg weakness) - continue diabetes treatment - consider PT and exercise regimen  OPTIC NERVE SHEATH ENLARGEMENT (likely incidental finding not related to gait difficulty; could be related to weight gain) - refer to [facility] ophthalmology - then may consider lumbar puncture  Orders Placed This Encounter  Procedures  . MR LUMBAR SPINE WO CONTRAST  . Ambulatory referral to Ophthalmology   Return in about 3 months (around 11/04/2016).    Penni Bombard, MD 06/07/8125, 51:70 AM Certified in Neurology, Neurophysiology and Neuroimaging  [facility] [address], [place], [place] [address] [phone]

## 2016-08-04 NOTE — Telephone Encounter (Signed)
This should be able to wait until Thursday.

## 2016-08-06 NOTE — Telephone Encounter (Signed)
Indication for chronic opioid: CPS, chronic neck pain, failed spine syndrome Medication and dose: Norco 10, q 6 hours # pills per month: 120 Last UDS date: 10/17 Pain contract signed (Y/N): y Date narcotic database last reviewed (include red flags): 08/06/16

## 2016-08-06 NOTE — Telephone Encounter (Signed)
Left message for Matthew Eaton that his prescription is ready to be picked up at the front desk.

## 2016-08-10 ENCOUNTER — Other Ambulatory Visit: Payer: Self-pay | Admitting: Family Medicine

## 2016-08-10 NOTE — Telephone Encounter (Signed)
Clonazepam called into [facility] - [place], [place] - [address] Phone: [phone]  

## 2016-08-10 NOTE — Telephone Encounter (Signed)
Last office visit 06/03/2016.  Last refilled 04/15/2016 for #60 with 3 refills.  Ok to refill?

## 2016-08-10 NOTE — Telephone Encounter (Signed)
Ok to refill #60, 3 refills 

## 2016-08-19 ENCOUNTER — Ambulatory Visit
Admission: RE | Admit: 2016-08-19 | Discharge: 2016-08-19 | Disposition: A | Discharge status: Home / Self Care | Payer: BC Managed Care – PPO | Source: Ambulatory Visit | Attending: Diagnostic Neuroimaging | Admitting: Diagnostic Neuroimaging

## 2016-08-19 DIAGNOSIS — R269 Unspecified abnormalities of gait and mobility: Secondary | ICD-10-CM

## 2016-08-19 DIAGNOSIS — M5442 Lumbago with sciatica, left side: Secondary | ICD-10-CM

## 2016-08-19 DIAGNOSIS — G8929 Other chronic pain: Secondary | ICD-10-CM

## 2016-08-19 DIAGNOSIS — M5441 Lumbago with sciatica, right side: Secondary | ICD-10-CM

## 2016-09-04 ENCOUNTER — Telehealth: Payer: Self-pay | Admitting: *Deleted

## 2016-09-04 NOTE — Telephone Encounter (Signed)
Spoke with patient and informed d him his MRI of lumbar spine showed arthritis changes. Advised him there are no definite major pinched nerves. Dr Leta Baptist recommends conservative management such as physical therapy.  Patient stated he had just left the ophthalmologist who will send his report to Dr Leta Baptist. Advised patient he will get a call if there are any instructions after Dr Leta Baptist reviews the note. Patient verbalized understanding of call, appreciation.

## 2016-09-04 NOTE — Telephone Encounter (Signed)
Attempted to reach patient. Phone rang continuously so unable to LVM re: MRI results. Will try later.

## 2016-09-07 NOTE — Telephone Encounter (Signed)
Rx for Hydrocodone-APAP 10-325 mg written on 01/07/2016 was never picked up by patient.  Looks like another Rx was printed on 01/23/2016.  Rx from 01/07/2016 shredded.

## 2016-09-14 ENCOUNTER — Other Ambulatory Visit: Payer: Self-pay | Admitting: *Deleted

## 2016-09-14 ENCOUNTER — Telehealth: Payer: Self-pay | Admitting: Family Medicine

## 2016-09-14 MED ORDER — HYDROCODONE-ACETAMINOPHEN 10-325 MG PO TABS: ORAL_TABLET | ORAL | 0 refills | Status: DC

## 2016-09-14 NOTE — Telephone Encounter (Signed)
Called cell phone # & spoke with patient

## 2016-09-14 NOTE — Telephone Encounter (Signed)
Patient's wife left a voicemail requesting a refill on Hydrocodone. Last refill 08/06/16 #120 Last office visit 06/03/16 Patient is completely out of medication. Advised patient to make sure he gives up at least 24 hours notice on refills in the future.

## 2016-09-14 NOTE — Telephone Encounter (Signed)
Caller Name:Kimberly Hardin Negus  Relationship to Patient:wife Best [phone] Pharmacy:  Reason for call:  Returning call possibly about rx , got missed call

## 2016-09-14 NOTE — Telephone Encounter (Signed)
Attempted call x 2 phone has so much static can't hear. Wanted to inform patient RX is available for pick up

## 2016-10-19 ENCOUNTER — Other Ambulatory Visit: Payer: Self-pay

## 2016-10-19 MED ORDER — HYDROCODONE-ACETAMINOPHEN 10-325 MG PO TABS: ORAL_TABLET | ORAL | 0 refills | Status: DC

## 2016-10-19 NOTE — Telephone Encounter (Signed)
Pain management f/u within the next month is needed. Set at time he picks up script

## 2016-10-19 NOTE — Telephone Encounter (Signed)
Mrs. Therien notified by telephone that Matthew Eaton's prescription is ready to be picked up at the front desk.  Advised he will need to schedule office visit sometime in the next month to discuss pain management ([place] Stop Act) per Dr. Lorelei Pont.

## 2016-10-19 NOTE — Telephone Encounter (Signed)
,  Pt left v/m requesting rx hydrocodone apap. Call when ready for pick up. Last printed # 120 on 09/14/16. Last seen f/u 12/25/15. Pt will be out of med on 10/22/16.

## 2016-11-05 ENCOUNTER — Other Ambulatory Visit: Payer: Self-pay | Admitting: Family Medicine

## 2016-11-11 ENCOUNTER — Ambulatory Visit (INDEPENDENT_AMBULATORY_CARE_PROVIDER_SITE_OTHER): Payer: BC Managed Care – PPO | Admitting: Family Medicine

## 2016-11-11 ENCOUNTER — Encounter: Payer: Self-pay | Admitting: Family Medicine

## 2016-11-11 VITALS — BP 106/80 | HR 95 | Temp 97.6°F | Ht 68.5 in | Wt 228.5 lb

## 2016-11-11 DIAGNOSIS — R52 Pain, unspecified: Secondary | ICD-10-CM

## 2016-11-11 DIAGNOSIS — M961 Postlaminectomy syndrome, not elsewhere classified: Secondary | ICD-10-CM | POA: Insufficient documentation

## 2016-11-11 DIAGNOSIS — Z23 Encounter for immunization: Secondary | ICD-10-CM

## 2016-11-11 DIAGNOSIS — M542 Cervicalgia: Secondary | ICD-10-CM | POA: Diagnosis not present

## 2016-11-11 DIAGNOSIS — G8929 Other chronic pain: Secondary | ICD-10-CM

## 2016-11-11 MED ORDER — HYDROCODONE-ACETAMINOPHEN 10-325 MG PO TABS: ORAL_TABLET | ORAL | 0 refills | Status: DC

## 2016-11-11 NOTE — Progress Notes (Signed)
Dr. Frederico Hamman T. Nareg Breighner, MD, [facility] [place], [address] Phone: [phone] Fax: [phone]  11/11/2016  Patient: Matthew Eaton, MRN: [id number], DOB: October 06, 1960, 56 y.o.  Primary Physician:  Owens Loffler, MD   Chief Complaint  Patient presents with  . Pain Management   Subjective:   Matthew Eaton is a 56 y.o. 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   Indication for chronic opioid: failed neck syndrome, chronic pain Medication and dose: norco 10, 1 po q 6 prn pain # pills per month: 120 Last UDS date: 11/11/2016 Pain contract signed (Y/N): y Date narcotic database last reviewed (include red flags): none  Still with pain, ups and downs.  Good and bad days.  Refill celexa.    Past Medical History, Surgical History, Social History, Family History, Problem List, Medications, and Allergies have been reviewed and updated if relevant.  Patient Active Problem List   Diagnosis Date Noted  . Chronic bilateral low back pain without sciatica 06/04/2016  . Vitamin D deficiency 10/15/2015  . Vitamin B12 deficiency 10/15/2015  . Family hx of ALS (amyotrophic lateral sclerosis) 05/27/2013  . Major depressive disorder, recurrent episode, moderate ([place]) 05/27/2013  . Obesity (BMI 30-39.9) 05/22/2013  . Insomnia 05/20/2012  . Chronic neck pain 05/20/2012  . Hiatal hernia 01/05/2011  . [place] DISEASE, CERVICAL 01/22/2010  . TOBACCO ABUSE, HX OF 01/22/2010  . Generalized anxiety disorder 03/06/2009  . Hypogonadism in male 12/19/2008  . Hyperlipidemia 11/15/2008  . GERD 11/15/2008    Past Medical History:  Diagnosis Date  . Chronic neck pain 05/20/2012  . Family hx of ALS (amyotrophic lateral sclerosis) 05/27/2013  . GERD (gastroesophageal reflux disease)   . Hiatal hernia   . HLD (hyperlipidemia)   . Major  depressive disorder, recurrent episode, moderate ([place]) 05/27/2013  . Neck pain    cervical spine with herniation  . Tobacco abuse   . Wrist fracture, left     Past Surgical History:  Procedure Laterality Date  . NECK SURGERY     x2- 2010, 2011    Social History   Social History  . Marital status: Married    Spouse name: N/A  . Number of children: N/A  . Years of education: N/A   Occupational History  . Not on file.   Social History Main Topics  . Smoking status: Former Smoker    Years: 30.00    Quit date: 02/03/2007  . Smokeless tobacco: Never Used  . Alcohol use No  . Drug use: No  . Sexual activity: Yes    Partners: Female   Other Topics Concern  . Not on file   Social History Narrative   Lives home with wife, Joelene Millin.  Education 12th grade.  Five children.    Family History  Problem Relation Age of Onset  . ALS Mother   . ALS Brother   . ALS Maternal Uncle   . ALS Maternal Aunt   . Colon cancer Neg Hx   . Esophageal cancer Neg Hx   . Rectal cancer Neg Hx   . Stomach cancer Neg Hx     Allergies  Allergen Reactions  . Methocarbamol Shortness Of Breath  . Gabapentin     Medication list reviewed and updated in full in [place].   GEN: No acute illnesses, no fevers, chills. GI: No n/v/d, eating  normally Pulm: No SOB Interactive and getting along well at home.  Otherwise, ROS is as per the HPI.  Objective:   BP 106/80   Pulse 95   Temp 97.6 F (36.4 C) (Oral)   Ht 5' 8.5" (1.74 m)   Wt 228 lb 8 oz (103.6 kg)   BMI 34.24 kg/m   GEN: WDWN, NAD, Non-toxic, A & O x 3 HEENT: Atraumatic, Normocephalic. Neck supple. No masses, No LAD. Ears and Nose: No external deformity. CV: RRR, No M/G/R. No JVD. No thrill. No extra heart sounds. PULM: CTA B, no wheezes, crackles, rhonchi. No retractions. No resp. distress. No accessory muscle use. EXTR: No c/c/e NEURO Normal gait.  PSYCH: Normally interactive. Conversant. Not depressed or anxious  appearing.  Calm demeanor.   Laboratory and Imaging Data:  Assessment and Plan:   Pain management - Plan: Pain Mgmt, Profile 8 w/Conf, U  Chronic neck pain  Need for prophylactic vaccination and inoculation against influenza - Plan: Flu Vaccine QUAD 36+ mos IM  Failed neck syndrome  Stable chronic pain. Reviewed STOP act.  Follow-up: Return in about 3 months (around 02/11/2017).  Future Appointments Date Time Provider [place]  11/18/2016 2:00 PM Penni Bombard, MD [facility] None  03/15/2017 2:15 PM Quanisha Drewry, Frederico Hamman, MD [id number]    Meds ordered this encounter  Medications  . DISCONTD: HYDROcodone-acetaminophen (NORCO) 10-325 MG tablet    Sig: TAKE 1 TABLET BY MOUTH EVERY 6 HOURS AS NEEDED FOR SEVERE PAIN    Dispense:  120 tablet    Refill:  0    Do Not Fill Until 11/18/2016  . DISCONTD: HYDROcodone-acetaminophen (NORCO) 10-325 MG tablet    Sig: TAKE 1 TABLET BY MOUTH EVERY 6 HOURS AS NEEDED FOR SEVERE PAIN    Dispense:  120 tablet    Refill:  0    Do Not Fill Until 12/19/2016  . HYDROcodone-acetaminophen (NORCO) 10-325 MG tablet    Sig: TAKE 1 TABLET BY MOUTH EVERY 6 HOURS AS NEEDED FOR SEVERE PAIN    Dispense:  120 tablet    Refill:  0    Do Not Fill Until 01/18/2017   Medications Discontinued During This Encounter  Medication Reason  . HYDROcodone-acetaminophen (NORCO) 10-325 MG tablet Reorder  . HYDROcodone-acetaminophen (NORCO) 10-325 MG tablet Reorder  . HYDROcodone-acetaminophen (NORCO) 10-325 MG tablet Reorder   Orders Placed This Encounter  Procedures  . Flu Vaccine QUAD 36+ mos IM  . Pain Mgmt, Profile 8 w/Conf, U    Signed,  Arissa Fagin T. Azariah Latendresse, MD   Allergies as of 11/11/2016      Reactions   Methocarbamol Shortness Of Breath   Gabapentin       Medication List       Accurate as of 11/11/16  3:51 PM. Always use your most recent med list.          aspirin 81 MG tablet Take 81 mg by mouth daily.   atorvastatin  80 MG tablet Commonly known as:  LIPITOR TAKE 1 TABLET BY MOUTH DAILY AT 6 PM.   buPROPion 150 MG 24 hr tablet Commonly known as:  WELLBUTRIN XL TAKE 1 TABLET BY MOUTH DAILY.   cholecalciferol 1000 units tablet Commonly known as:  VITAMIN D Take 2,000 Units by mouth daily.   citalopram 40 MG tablet Commonly known as:  CELEXA TAKE 1 TABLET BY MOUTH DAILY.   clonazePAM 0.5 MG tablet Commonly known as:  KLONOPIN TAKE 1 TABLET BY MOUTH TWICE A DAY AS NEEDED  HYDROcodone-acetaminophen 10-325 MG tablet Commonly known as:  NORCO TAKE 1 TABLET BY MOUTH EVERY 6 HOURS AS NEEDED FOR SEVERE PAIN   metFORMIN 500 MG 24 hr tablet Commonly known as:  GLUCOPHAGE-XR Take 1 tablet (500 mg total) by mouth daily with breakfast.   NONFORMULARY OR COMPOUNDED ITEM Cream for pain   Omeprazole Magnesium 20.6 (20 Base) MG Cpdr Take 1-2 tablets by mouth daily.

## 2016-11-17 LAB — PAIN MGMT, PROFILE 8 W/CONF, U
6 ACETYLMORPHINE: NEGATIVE ng/mL (ref <10)
ALPHAHYDROXYTRIAZOLAM: NEGATIVE ng/mL (ref <50)
AMINOCLONAZEPAM: 344 ng/mL — AB (ref <25)
Alcohol Metabolites: POSITIVE ng/mL — AB (ref <500)
Alphahydroxyalprazolam: NEGATIVE ng/mL (ref <25)
Alphahydroxymidazolam: NEGATIVE ng/mL (ref <50)
Amphetamines: NEGATIVE ng/mL (ref <500)
BUPRENORPHINE, URINE: NEGATIVE ng/mL (ref <5)
Benzodiazepines: POSITIVE ng/mL — AB (ref <100)
CREATININE: 231.4 mg/dL
Cocaine Metabolite: NEGATIVE ng/mL (ref <150)
Codeine: NEGATIVE ng/mL (ref <50)
ETHYL SULFATE (ETS): 160 ng/mL — AB (ref <100)
Ethyl Glucuronide (ETG): 1106 ng/mL — ABNORMAL HIGH (ref <500)
HYDROCODONE: 3588 ng/mL — AB (ref <50)
HYDROMORPHONE: 152 ng/mL — AB (ref <50)
Hydroxyethylflurazepam: NEGATIVE ng/mL (ref <50)
LORAZEPAM: NEGATIVE ng/mL (ref <50)
MARIJUANA METABOLITE: NEGATIVE ng/mL (ref <20)
MDMA: NEGATIVE ng/mL (ref <500)
Morphine: NEGATIVE ng/mL (ref <50)
Nordiazepam: NEGATIVE ng/mL (ref <50)
Norhydrocodone: 6372 ng/mL — ABNORMAL HIGH (ref <50)
OPIATES: POSITIVE ng/mL — AB (ref <100)
OXAZEPAM: NEGATIVE ng/mL (ref <50)
Oxidant: NEGATIVE ug/mL (ref <200)
Oxycodone: NEGATIVE ng/mL (ref <100)
TEMAZEPAM: NEGATIVE ng/mL (ref <50)
pH: 6.28 (ref 4.5–9.0)

## 2016-11-18 ENCOUNTER — Encounter: Payer: Self-pay | Admitting: Diagnostic Neuroimaging

## 2016-11-18 ENCOUNTER — Ambulatory Visit (INDEPENDENT_AMBULATORY_CARE_PROVIDER_SITE_OTHER): Payer: BC Managed Care – PPO | Admitting: Diagnostic Neuroimaging

## 2016-11-18 VITALS — BP 113/79 | HR 89 | Wt 228.8 lb

## 2016-11-18 DIAGNOSIS — G8929 Other chronic pain: Secondary | ICD-10-CM

## 2016-11-18 DIAGNOSIS — M5442 Lumbago with sciatica, left side: Secondary | ICD-10-CM

## 2016-11-18 DIAGNOSIS — M5441 Lumbago with sciatica, right side: Secondary | ICD-10-CM | POA: Diagnosis not present

## 2016-11-18 DIAGNOSIS — H47093 Other disorders of optic nerve, not elsewhere classified, bilateral: Secondary | ICD-10-CM

## 2016-11-18 DIAGNOSIS — R269 Unspecified abnormalities of gait and mobility: Secondary | ICD-10-CM

## 2016-11-18 NOTE — Progress Notes (Signed)
[facility]  PATIENT: Matthew Eaton DOB: 1960-08-29  REFERRING CLINICIAN: S [name] HISTORY FROM: patient REASON FOR VISIT: follow up   HISTORICAL  CHIEF COMPLAINT:  Chief Complaint  Patient presents with  . Gait Problem    rm 7, "no dizzy spells, much better than before"  . Follow-up    3 month    HISTORY OF PRESENT ILLNESS:   UPDATE (11/18/16, VRP): Since last visit, doing well. MRI lumbar spine reviewed. Ophthal consult completed --> no papilledema. No alleviating or aggravating factors. Overall feels better.   Of note, patient had maternal uncle, maternal aunt, brother and mother all died from [place] (ranging from 2010-2016).   PRIOR HPI (08/04/16): 56 year old male here for evaluation of gait and balance difficulty and abnormal MRI brain. Patient has history of cervical spine surgery in 2010 2011 and related to neck pain, left shoulder pain and left arm pain. Patient initially had C6-7 discectomy and fusion in 2010 followed by C5-6 discectomy and fusion in 2011. Patient had some balance and weakness problems at that time in between the 2 surgeries but then recovered. In 2015 patient had progressive low back pain, lower extremity weakness, fatigue, physical and mobility limitations. Patient has seen orthopedic spine surgery, pain management, PCP, and now sports medicine clinic for evaluation. In last few months patient having significantly more problems with gait and balance problems. Patient recently diagnosed with diabetes. Has some numbness in his feet. Patient had MRI of the brain to evaluate balance and gait difficulties, was found to have enlarged optic nerve sheaths and therefore raise possibility of idiopathic intracranial hypertension. Patient referred here for evaluation of this issue as well. Patient denies any vision issues or new headaches. Patient has had some gradual weight gain over the past 2 years due to sedentary lifestyle, and limitation in  mobility due to pain. Patient also feels somewhat hopeless, depressed, due to inability to work and perform physical activities due to his neck pain and other limitations. He has applied for Social Security disability but has been denied. He is hoping to be able to get disability or get back to work.    REVIEW OF SYSTEMS: Full 14 system review of systems performed and negative with exception of: restless legs daytime sleepiness drooling.   ALLERGIES: Allergies  Allergen Reactions  . Methocarbamol Shortness Of Breath  . Gabapentin     HOME MEDICATIONS: Outpatient Medications Prior to Visit  Medication Sig Dispense Refill  . aspirin 81 MG tablet Take 81 mg by mouth daily.      Marland Kitchen atorvastatin (LIPITOR) 80 MG tablet TAKE 1 TABLET BY MOUTH DAILY AT 6 PM. 90 tablet 3  . buPROPion (WELLBUTRIN XL) 150 MG 24 hr tablet TAKE 1 TABLET BY MOUTH DAILY. 30 tablet 11  . cholecalciferol (VITAMIN D) 1000 units tablet Take 2,000 Units by mouth daily.    . citalopram (CELEXA) 40 MG tablet TAKE 1 TABLET BY MOUTH DAILY. 30 tablet 2  . clonazePAM (KLONOPIN) 0.5 MG tablet TAKE 1 TABLET BY MOUTH TWICE A DAY AS NEEDED 60 tablet 3  . HYDROcodone-acetaminophen (NORCO) 10-325 MG tablet TAKE 1 TABLET BY MOUTH EVERY 6 HOURS AS NEEDED FOR SEVERE PAIN 120 tablet 0  . metFORMIN (GLUCOPHAGE-XR) 500 MG 24 hr tablet Take 1 tablet (500 mg total) by mouth daily with breakfast. 30 tablet 5  . NONFORMULARY OR COMPOUNDED ITEM Cream for pain    . Omeprazole Magnesium 20.6 (20 BASE) MG CPDR Take 1-2 tablets by mouth daily.  No facility-administered medications prior to visit.     PAST MEDICAL HISTORY: Past Medical History:  Diagnosis Date  . Chronic neck pain 05/20/2012  . Family hx of ALS (amyotrophic lateral sclerosis) 05/27/2013  . GERD (gastroesophageal reflux disease)   . Hiatal hernia   . HLD (hyperlipidemia)   . Major depressive disorder, recurrent episode, moderate ([place]) 05/27/2013  . Neck pain    cervical spine  with herniation  . Tobacco abuse   . Wrist fracture, left     PAST SURGICAL HISTORY: Past Surgical History:  Procedure Laterality Date  . NECK SURGERY     x2- 2010, 2011    FAMILY HISTORY: Family History  Problem Relation Age of Onset  . ALS Mother   . ALS Brother   . ALS Maternal Uncle   . ALS Maternal Aunt   . Colon cancer Neg Hx   . Esophageal cancer Neg Hx   . Rectal cancer Neg Hx   . Stomach cancer Neg Hx     SOCIAL HISTORY:  Social History   Social History  . Marital status: Married    Spouse name: N/A  . Number of children: 5  . Years of education: 12   Occupational History  . Not on file.   Social History Main Topics  . Smoking status: Former Smoker    Years: 30.00    Quit date: 02/03/2007  . Smokeless tobacco: Never Used  . Alcohol use No  . Drug use: No  . Sexual activity: Yes    Partners: Female   Other Topics Concern  . Not on file   Social History Narrative   Lives home with wife, Joelene Millin.  Education 12th grade.  Five children.     PHYSICAL EXAM  GENERAL EXAM/CONSTITUTIONAL: Vitals:  Vitals:   11/18/16 1356  BP: 113/79  Pulse: 89  Weight: 228 lb 12.8 oz (103.8 kg)   Body mass index is 34.28 kg/m. No exam data present  Patient is in no distress; well developed, nourished and groomed; neck is supple  CARDIOVASCULAR:  Examination of carotid arteries is normal; no carotid bruits  Regular rate and rhythm, no murmurs  Examination of peripheral vascular system by observation and palpation is normal  EYES:  Ophthalmoscopic exam of optic discs and posterior segments is normal; no papilledema or hemorrhages  MUSCULOSKELETAL:  Gait, strength, tone, movements noted in Neurologic exam below  NEUROLOGIC: MENTAL STATUS:  No flowsheet data found.  awake, alert, oriented to person, place and time  recent and remote memory intact  normal attention and concentration  language fluent, comprehension intact, naming intact,    fund of knowledge appropriate  CRANIAL NERVE:   2nd - no papilledema on fundoscopic exam  2nd, 3rd, 4th, 6th - pupils equal and reactive to light, visual fields full to confrontation, extraocular muscles intact, no nystagmus  5th - facial sensation symmetric  7th - facial strength symmetric  8th - hearing intact  9th - palate elevates symmetrically, uvula midline  11th - shoulder shrug symmetric  12th - tongue protrusion midline  MOTOR:   normal bulk and tone  full strength in the BUE, BLE  SENSORY:   normal and symmetric to light touch, temperature, vibration  EXCEPT DECR VIB IN TOES (< 5 SEC)  COORDINATION:   finger-nose-finger, fine finger movements normal  REFLEXES:   deep tendon reflexes --> ARMS 1, RIGHT KNEE 2, LEFT KNEE 1, ANKLES 0  GAIT/STATION:   narrow based gait     DIAGNOSTIC DATA (  LABS, IMAGING, TESTING) - I reviewed patient records, labs, notes, testing and imaging myself where available.  Lab Results  Component Value Date   WBC 6.1 06/03/2016   HGB 13.1 06/03/2016   HCT 39.5 06/03/2016   MCV 86.1 06/03/2016   PLT 238.0 06/03/2016      Component Value Date/Time   NA 141 06/03/2016 1540   K 4.1 06/03/2016 1540   CL 106 06/03/2016 1540   CO2 29 06/03/2016 1540   GLUCOSE 105 (H) 06/03/2016 1540   BUN 14 06/03/2016 1540   CREATININE 1.13 06/03/2016 1540   CALCIUM 9.6 06/03/2016 1540   PROT 6.8 06/03/2016 1540   ALBUMIN 4.0 06/03/2016 1540   AST 43 (H) 06/03/2016 1540   ALT 60 (H) 06/03/2016 1540   ALKPHOS 95 06/03/2016 1540   BILITOT 0.3 06/03/2016 1540   GFRNONAA >60 01/27/2010 0923   GFRAA  01/27/2010 0923    >60        The eGFR has been calculated using the MDRD equation. This calculation has not been validated in all clinical situations. eGFR's persistently <60 mL/min signify possible Chronic Kidney Disease.   Lab Results  Component Value Date   CHOL 206 (H) 10/10/2015   HDL 41.80 10/10/2015   LDLCALC 95  05/18/2013   LDLDIRECT 96.0 10/10/2015   TRIG 368.0 (H) 10/10/2015   CHOLHDL 5 10/10/2015   Lab Results  Component Value Date   HGBA1C 6.7 (H) 06/03/2016   Lab Results  Component Value Date   VITAMINB12 515 06/03/2016   Lab Results  Component Value Date   TSH 1.45 06/03/2016    02/02/11 cervical spine xray - Stable C5-C6 ACDF with no adverse hardware features.  Previous C6-C7 fusion appears solid.  06/14/16 MRI brain [I reviewed images myself and agree with interpretation. -VRP]  - Negative for infarct. - Mild ventricular enlargement felt to be due to atrophy. Optic nerve sheaths distended with fluid suggesting increased intracranial pressure. Correlate with fundoscopic exam.  08/19/16 MRI lumbar spine - Abnormal MRI scan of the lumbar spine showing mild facet degenerative changes most prominent at L4-L5 there is moderate left greater than right foraminal narrowing but no definite root impingement.     ASSESSMENT AND PLAN  56 y.o. year old male here with history of cervical spine surgery 2, chronic neck pain, chronic low back pain, now with progressive gait difficulty and abnormal MRI brain study showing optic nerve sheath enlargement.   I think patient's gait difficulty are related to lumbar radiculopathy versus spinal stenosis, chronic pain, and now possible diabetic neuropathy.  I think the abnormal MRI brain findings are incidental finding and not related to his gait problem. Typically idiopathic intracranial hypertension (pseudotumor cerebri) would also have ringing in the ears, enlarged blind spot, papilledema, headaches. However will proceed with further workup starting with ophthalmology referral and then possible lumbar puncture.  Ddx of gait difficulty: lumbar radiculopathy, chronic pain, diabetic neuropathy  Ddx of optic nerve sheath enlargement: likely incidental finding  1. Gait difficulty   2. Chronic bilateral low back pain with bilateral sciatica   3.  Optic nerve asymmetry, bilateral      PLAN:  I spent 25 minutes of face to face time with patient. Greater than 50% of time was spent in counseling and coordination of care with patient. In summary we discussed:   GAIT DIFFICULTY - continue diabetes treatment - consider PT and exercise regimen  OPTIC NERVE SHEATH ENLARGEMENT (likely incidental finding not related to gait difficulty; could  be related to weight gain) - ophthalmology eval was unremarkable - monitor for symptoms  Return if symptoms worsen or fail to improve, for return to PCP.    Penni Bombard, MD 82/07/154, 1:53 PM Certified in Neurology, Neurophysiology and Neuroimaging  [facility] [address], [place], [place] [address] [phone]

## 2016-12-02 ENCOUNTER — Other Ambulatory Visit: Payer: Self-pay | Admitting: *Deleted

## 2016-12-02 ENCOUNTER — Other Ambulatory Visit: Payer: Self-pay | Admitting: Family Medicine

## 2016-12-02 MED ORDER — CLONAZEPAM 0.5 MG PO TABS: 0.5000 mg | ORAL_TABLET | Freq: Two times a day (BID) | ORAL | 5 refills | Status: DC | PRN

## 2016-12-02 NOTE — Telephone Encounter (Signed)
Last office visit 11/11/2016.  Last Lipid 10/10/2015.  Refill?

## 2016-12-02 NOTE — Telephone Encounter (Signed)
Clonazepam called into [facility], [place] Phone: [phone]

## 2016-12-02 NOTE — Telephone Encounter (Signed)
Arp, 60, 5 ref

## 2016-12-02 NOTE — Telephone Encounter (Signed)
Last office visit 11/11/2016.  Last refilled 08/10/2016 for #60 with 3 refills.  Ok to refill?

## 2016-12-15 ENCOUNTER — Other Ambulatory Visit: Payer: Self-pay | Admitting: Family Medicine

## 2016-12-17 ENCOUNTER — Other Ambulatory Visit: Payer: Self-pay | Admitting: Family Medicine

## 2017-01-28 ENCOUNTER — Other Ambulatory Visit: Payer: Self-pay | Admitting: Family Medicine

## 2017-03-09 ENCOUNTER — Other Ambulatory Visit: Payer: Self-pay | Admitting: Family Medicine

## 2017-03-09 NOTE — Telephone Encounter (Signed)
Copied from [place] [id number]. Topic: Quick Communication - Rx Refill/Question >> Mar 09, 2017  5:20 PM Boyd Kerbs wrote: Medication:  HYDROcodone-acetaminophen (NORCO) 10-325 MG tablet   Has the patient contacted their pharmacy? Yes.     (Agent: If no, request that the patient contact the pharmacy for the refill.)  [facility], [place] [address] Phone: [phone] Fax: [phone]   Preferred Pharmacy (with phone number or street name):    Agent: Please be advised that RX refills may take up to 3 business days. We ask that you follow-up with your pharmacy. Yes  Pt. Has appt on 2/11 but only has one more pill

## 2017-03-10 MED ORDER — HYDROCODONE-ACETAMINOPHEN 10-325 MG PO TABS: ORAL_TABLET | ORAL | 0 refills | Status: DC

## 2017-03-10 NOTE — Telephone Encounter (Signed)
Refill for hydrocodone-acetaminophen. LOV 11/11/16 Last refill 11/11/16 NOV 03/15/17  Has one more pill left.

## 2017-03-10 NOTE — Telephone Encounter (Signed)
Left message for Matthew Eaton that Dr. Lorelei Pont sent in enough pain medicine to get him to his appointment on Monday.

## 2017-03-10 NOTE — Telephone Encounter (Signed)
He was supposed to have made a 3 month office visit with me for pain management. I cannot give him opioids and be in compliance with [place] law until I see him again.   I could try to urgently work him in this afternoon at the end of the day.

## 2017-03-15 ENCOUNTER — Other Ambulatory Visit: Payer: Self-pay

## 2017-03-15 ENCOUNTER — Ambulatory Visit: Payer: BC Managed Care – PPO | Admitting: Family Medicine

## 2017-03-15 ENCOUNTER — Encounter: Payer: Self-pay | Admitting: Family Medicine

## 2017-03-15 VITALS — BP 90/70 | HR 100 | Temp 98.0°F | Ht 68.5 in | Wt 226.5 lb

## 2017-03-15 DIAGNOSIS — M961 Postlaminectomy syndrome, not elsewhere classified: Secondary | ICD-10-CM | POA: Diagnosis not present

## 2017-03-15 DIAGNOSIS — F1190 Opioid use, unspecified, uncomplicated: Secondary | ICD-10-CM | POA: Diagnosis not present

## 2017-03-15 MED ORDER — HYDROCODONE-ACETAMINOPHEN 10-325 MG PO TABS: ORAL_TABLET | ORAL | 0 refills | Status: DC

## 2017-03-15 MED ORDER — HYDROCODONE-ACETAMINOPHEN 10-325 MG PO TABS: 1.0000 | ORAL_TABLET | Freq: Four times a day (QID) | ORAL | 0 refills | Status: DC | PRN

## 2017-03-15 NOTE — Progress Notes (Signed)
Dr. Frederico Hamman T. Caydin Yeatts, MD, [facility] [place], [address] Phone: [phone] Fax: [phone]  03/15/2017  Patient: Matthew Eaton, MRN: [id number], DOB: 1960/06/28, 57 y.o.  Primary Physician:  Owens Loffler, MD   Chief Complaint  Patient presents with  . Pain Management   Subjective:   Matthew Eaton is a 57 y.o. very pleasant male patient who presents with the following:  Indication for chronic opioid: failed neck syndrome Medication and dose: norco 10 # pills per month: 120 Last UDS date: 11/2016 Pain contract signed (Y/N): y Date narcotic database last reviewed (include red flags): 03/15/2017  He is frustrated by the disability system, since he was denied disability but remains disabled - from [employer] disability.   Past Medical History, Surgical History, Social History, Family History, Problem List, Medications, and Allergies have been reviewed and updated if relevant.  Patient Active Problem List   Diagnosis Date Noted  . 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 Chronic bilateral low back pain without sciatica 06/04/2016  . Vitamin D deficiency 10/15/2015  . Vitamin B12 deficiency 10/15/2015  . Family hx of ALS (amyotrophic lateral sclerosis) 05/27/2013  . Major depressive disorder, recurrent episode, moderate ([place]) 05/27/2013  . Obesity (BMI 30-39.9) 05/22/2013  . Insomnia 05/20/2012  . Chronic neck pain 05/20/2012  . Hiatal hernia 01/05/2011  . [place] DISEASE, CERVICAL 01/22/2010  . TOBACCO ABUSE, HX OF 01/22/2010  . Generalized anxiety disorder 03/06/2009  . Hypogonadism in male 12/19/2008  . Hyperlipidemia 11/15/2008  . GERD 11/15/2008    Past Medical History:  Diagnosis Date  . Chronic neck pain 05/20/2012  . Family hx of ALS (amyotrophic lateral sclerosis) 05/27/2013  . GERD (gastroesophageal reflux disease)   . Hiatal hernia   . HLD (hyperlipidemia)   . Major depressive disorder,  recurrent episode, moderate ([place]) 05/27/2013  . Neck pain    cervical spine with herniation  . Tobacco abuse   . Wrist fracture, left     Past Surgical History:  Procedure Laterality Date  . NECK SURGERY     x2- 2010, 2011    Social History   Socioeconomic History  . Marital status: Married    Spouse name: Not on file  . Number of children: 5  . Years of education: 75  . Highest education level: Not on file  Social Needs  . Financial resource strain: Not on file  . Food insecurity - worry: Not on file  . Food insecurity - inability: Not on file  . Transportation needs - medical: Not on file  . Transportation needs - non-medical: Not on file  Occupational History  . Not on file  Tobacco Use  . Smoking status: Former Smoker    Years: 30.00    Last attempt to quit: 02/03/2007    Years since quitting: 10.1  . Smokeless tobacco: Never Used  Substance and Sexual Activity  . Alcohol use: No  . Drug use: No  . Sexual activity: Yes    Partners: Female  Other Topics Concern  . Not on file  Social History Narrative   Lives home with wife, Joelene Millin.  Education 12th grade.  Five children.    Family History  Problem Relation Age of Onset  . ALS Mother   . ALS Brother   . ALS Maternal Uncle   . ALS Maternal Aunt   . Colon cancer Neg Hx   . Esophageal cancer Neg Hx   . Rectal  cancer Neg Hx   . Stomach cancer Neg Hx     Allergies  Allergen Reactions  . Methocarbamol Shortness Of Breath  . Gabapentin     Medication list reviewed and updated in full in [place].   GEN: No acute illnesses, no fevers, chills. GI: No n/v/d, eating normally Pulm: No SOB Interactive and getting along well at home.  Otherwise, ROS is as per the HPI.  Objective:   BP 90/70   Pulse 100   Temp 98 F (36.7 C) (Oral)   Ht 5' 8.5" (1.74 m)   Wt 226 lb 8 oz (102.7 kg)   BMI 33.94 kg/m   GEN: WDWN, NAD, Non-toxic, A & O x 3 HEENT: Atraumatic, Normocephalic. Neck supple. No  masses, No LAD. Ears and Nose: No external deformity. CV: RRR, No M/G/R. No JVD. No thrill. No extra heart sounds. PULM: CTA B, no wheezes, crackles, rhonchi. No retractions. No resp. distress. No accessory muscle use. EXTR: No c/c/e NEURO Normal gait.  PSYCH: Normally interactive. Conversant. Not depressed or anxious appearing.  Calm demeanor.   Laboratory and Imaging Data:  Assessment and Plan:   Chronic, continuous use of opioids  Failed neck syndrome  Doing ok, not great.  Depression is fair - remains disillusioned.  Follow-up: Return in about 3 months (around 06/12/2017) for Complete Physical, labwork 1 week before.  Meds ordered this encounter  Medications  . HYDROcodone-acetaminophen (NORCO) 10-325 MG tablet    Sig: TAKE 1 TABLET BY MOUTH EVERY 6 HOURS AS NEEDED FOR SEVERE PAIN    Dispense:  120 tablet    Refill:  0    DO NOT FILL UNTIL 05/13/2017  . HYDROcodone-acetaminophen (NORCO) 10-325 MG tablet    Sig: Take 1 tablet by mouth every 6 (six) hours as needed for severe pain.    Dispense:  120 tablet    Refill:  0    DO NOT FILL UNTIL 04/12/2017  . HYDROcodone-acetaminophen (NORCO) 10-325 MG tablet    Sig: Take 1 tablet by mouth every 6 (six) hours as needed for severe pain.    Dispense:  120 tablet    Refill:  0   Signed,  Ramiz Turpin T. Dakotah Orrego, MD   Allergies as of 03/15/2017      Reactions   Methocarbamol Shortness Of Breath   Gabapentin       Medication List        Accurate as of 03/15/17 11:59 PM. Always use your most recent med list.          aspirin 81 MG tablet Take 81 mg by mouth daily.   atorvastatin 80 MG tablet Commonly known as:  LIPITOR TAKE 1 TABLET BY MOUTH DAILY AT 6 PM.   buPROPion 150 MG 24 hr tablet Commonly known as:  WELLBUTRIN XL TAKE 1 TABLET BY MOUTH DAILY.   cholecalciferol 1000 units tablet Commonly known as:  VITAMIN D Take 2,000 Units by mouth daily.   citalopram 40 MG tablet Commonly known as:  CELEXA TAKE 1  TABLET BY MOUTH DAILY.   clonazePAM 0.5 MG tablet Commonly known as:  KLONOPIN Take 1 tablet (0.5 mg total) by mouth 2 (two) times daily as needed.   HYDROcodone-acetaminophen 10-325 MG tablet Commonly known as:  NORCO TAKE 1 TABLET BY MOUTH EVERY 6 HOURS AS NEEDED FOR SEVERE PAIN   HYDROcodone-acetaminophen 10-325 MG tablet Commonly known as:  NORCO Take 1 tablet by mouth every 6 (six) hours as needed for severe pain.  HYDROcodone-acetaminophen 10-325 MG tablet Commonly known as:  NORCO Take 1 tablet by mouth every 6 (six) hours as needed for severe pain.   metFORMIN 500 MG 24 hr tablet Commonly known as:  GLUCOPHAGE-XR TAKE 1 TABLET BY MOUTH DAILY WITH BREAKFAST.   Omeprazole Magnesium 20.6 (20 Base) MG Cpdr Take 1-2 tablets by mouth daily.

## 2017-04-24 ENCOUNTER — Other Ambulatory Visit: Payer: Self-pay | Admitting: Family Medicine

## 2017-06-04 ENCOUNTER — Other Ambulatory Visit: Payer: Self-pay | Admitting: Family Medicine

## 2017-06-11 ENCOUNTER — Other Ambulatory Visit (INDEPENDENT_AMBULATORY_CARE_PROVIDER_SITE_OTHER): Payer: Self-pay

## 2017-06-11 ENCOUNTER — Other Ambulatory Visit: Payer: Self-pay | Admitting: Family Medicine

## 2017-06-11 DIAGNOSIS — E119 Type 2 diabetes mellitus without complications: Secondary | ICD-10-CM

## 2017-06-11 DIAGNOSIS — E559 Vitamin D deficiency, unspecified: Secondary | ICD-10-CM

## 2017-06-11 DIAGNOSIS — Z0000 Encounter for general adult medical examination without abnormal findings: Secondary | ICD-10-CM

## 2017-06-11 DIAGNOSIS — E291 Testicular hypofunction: Secondary | ICD-10-CM

## 2017-06-11 DIAGNOSIS — E538 Deficiency of other specified B group vitamins: Secondary | ICD-10-CM

## 2017-06-12 LAB — HEPATIC FUNCTION PANEL
AG RATIO: 1.8 (calc) (ref 1.0–2.5)
ALT: 71 U/L — AB (ref 9–46)
AST: 51 U/L — ABNORMAL HIGH (ref 10–35)
Albumin: 4.4 g/dL (ref 3.6–5.1)
Alkaline phosphatase (APISO): 104 U/L (ref 40–115)
BILIRUBIN INDIRECT: 0.3 mg/dL (ref 0.2–1.2)
Bilirubin, Direct: 0.1 mg/dL (ref 0.0–0.2)
GLOBULIN: 2.5 g/dL (ref 1.9–3.7)
TOTAL PROTEIN: 6.9 g/dL (ref 6.1–8.1)
Total Bilirubin: 0.4 mg/dL (ref 0.2–1.2)

## 2017-06-12 LAB — CBC WITH DIFFERENTIAL/PLATELET
BASOS ABS: 53 {cells}/uL (ref 0–200)
Basophils Relative: 0.8 %
EOS ABS: 132 {cells}/uL (ref 15–500)
EOS PCT: 2 %
HCT: 42.4 % (ref 38.5–50.0)
HEMOGLOBIN: 14.8 g/dL (ref 13.2–17.1)
Lymphs Abs: 2541 cells/uL (ref 850–3900)
MCH: 29.7 pg (ref 27.0–33.0)
MCHC: 34.9 g/dL (ref 32.0–36.0)
MCV: 85.1 fL (ref 80.0–100.0)
MONOS PCT: 8.8 %
MPV: 11.2 fL (ref 7.5–12.5)
NEUTROS ABS: 3293 {cells}/uL (ref 1500–7800)
NEUTROS PCT: 49.9 %
Platelets: 270 10*3/uL (ref 140–400)
RBC: 4.98 10*6/uL (ref 4.20–5.80)
RDW: 13.4 % (ref 11.0–15.0)
TOTAL LYMPHOCYTE: 38.5 %
WBC mixed population: 581 cells/uL (ref 200–950)
WBC: 6.6 10*3/uL (ref 3.8–10.8)

## 2017-06-12 LAB — LIPID PANEL
CHOL/HDL RATIO: 4.6 (calc) (ref <5.0)
Cholesterol: 183 mg/dL (ref <200)
HDL: 40 mg/dL — AB (ref >40)
LDL Cholesterol (Calc): 107 mg/dL (calc) — ABNORMAL HIGH
NON-HDL CHOLESTEROL (CALC): 143 mg/dL — AB (ref <130)
TRIGLYCERIDES: 238 mg/dL — AB (ref <150)

## 2017-06-12 LAB — VITAMIN D 25 HYDROXY (VIT D DEFICIENCY, FRACTURES): Vit D, 25-Hydroxy: 57 ng/mL (ref 30–100)

## 2017-06-12 LAB — MICROALBUMIN / CREATININE URINE RATIO
CREATININE, URINE: 392 mg/dL — AB (ref 20–320)
MICROALB UR: 3 mg/dL
MICROALB/CREAT RATIO: 8 ug/mg{creat} (ref <30)

## 2017-06-12 LAB — BASIC METABOLIC PANEL
BUN: 14 mg/dL (ref 7–25)
CHLORIDE: 105 mmol/L (ref 98–110)
CO2: 27 mmol/L (ref 20–32)
CREATININE: 1.31 mg/dL (ref 0.70–1.33)
Calcium: 10.1 mg/dL (ref 8.6–10.3)
Glucose, Bld: 93 mg/dL (ref 65–99)
POTASSIUM: 4.6 mmol/L (ref 3.5–5.3)
Sodium: 144 mmol/L (ref 135–146)

## 2017-06-12 LAB — HEMOGLOBIN A1C
EAG (MMOL/L): 7.4 (calc)
HEMOGLOBIN A1C: 6.3 %{Hb} — AB (ref <5.7)
Mean Plasma Glucose: 134 (calc)

## 2017-06-12 LAB — PSA: PSA: 0.8 ng/mL (ref <=4.0)

## 2017-06-16 ENCOUNTER — Encounter: Payer: BC Managed Care – PPO | Admitting: Family Medicine

## 2017-06-23 ENCOUNTER — Telehealth: Payer: Self-pay | Admitting: Family Medicine

## 2017-06-23 ENCOUNTER — Other Ambulatory Visit: Payer: Self-pay

## 2017-06-23 ENCOUNTER — Other Ambulatory Visit: Payer: Self-pay | Admitting: Family Medicine

## 2017-06-23 ENCOUNTER — Ambulatory Visit (INDEPENDENT_AMBULATORY_CARE_PROVIDER_SITE_OTHER): Payer: Self-pay | Admitting: Family Medicine

## 2017-06-23 ENCOUNTER — Encounter: Payer: Self-pay | Admitting: Family Medicine

## 2017-06-23 VITALS — BP 110/80 | HR 86 | Temp 98.0°F | Ht 68.5 in | Wt 221.5 lb

## 2017-06-23 DIAGNOSIS — M961 Postlaminectomy syndrome, not elsewhere classified: Secondary | ICD-10-CM

## 2017-06-23 DIAGNOSIS — Z0000 Encounter for general adult medical examination without abnormal findings: Secondary | ICD-10-CM

## 2017-06-23 MED ORDER — HYDROCODONE-ACETAMINOPHEN 10-325 MG PO TABS: 1.0000 | ORAL_TABLET | Freq: Four times a day (QID) | ORAL | 0 refills | Status: DC | PRN

## 2017-06-23 MED ORDER — HYDROCODONE-ACETAMINOPHEN 10-325 MG PO TABS: ORAL_TABLET | ORAL | 0 refills | Status: DC

## 2017-06-23 NOTE — Progress Notes (Signed)
Dr. Frederico Hamman T. Aletheia Tangredi, MD, [facility] [place], [address] Phone: [phone] Fax: [phone]  06/23/2017  Patient: Matthew Eaton, MRN: [id number], DOB: [birth year], 57 y.o.  Primary Physician:  Owens Loffler, MD   Chief Complaint  Patient presents with  . Annual Exam   Subjective:   Matthew Eaton is a 57 y.o. pleasant patient who presents with the following:  Preventative Health Maintenance Visit:  Health Maintenance Summary Reviewed and updated, unless pt declines services.  Tobacco History Reviewed. Alcohol: No concerns, no excessive use Exercise Habits: rare activity, rec at least 30 mins 5 times a week STD concerns: no risk or activity to increase risk Drug Use: None Encouraged self-testicular check  Having issues at home.   Indication for chronic opioid: failed neck syndrome Medication and dose: norco 10 # pills per month: 120 Last UDS date: 11/2016 Opioid Treatment Agreement signed (Y/N): y [place] reviewed this encounter (include red flags):   y  Health Maintenance  Topic Date Due  . Hepatitis C Screening  May 30, 1960  . PNEUMOCOCCAL POLYSACCHARIDE VACCINE (1) 10/30/1962  . FOOT EXAM  10/30/1970  . OPHTHALMOLOGY EXAM  10/30/1970  . HIV Screening  10/30/1975  . INFLUENZA VACCINE  09/02/2017  . HEMOGLOBIN A1C  12/12/2017  . URINE MICROALBUMIN  06/12/2018  . COLONOSCOPY  04/01/2021  . TETANUS/TDAP  03/05/2025   Immunization History  Administered Date(s) Administered  . Influenza,inj,Quad PF,6+ Mos 01/08/2014, 03/06/2015, 10/14/2015, 11/11/2016  . Tdap 03/06/2015   Patient Active Problem List   Diagnosis Date Noted  . 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 Chronic bilateral low back pain without sciatica 06/04/2016  . Vitamin D deficiency 10/15/2015  . Vitamin B12 deficiency 10/15/2015  . Family hx of ALS (amyotrophic lateral sclerosis) 05/27/2013  . Major depressive disorder,  recurrent episode, moderate ([place]) 05/27/2013  . Obesity (BMI 30-39.9) 05/22/2013  . Insomnia 05/20/2012  . Chronic neck pain 05/20/2012  . Hiatal hernia 01/05/2011  . [place] DISEASE, CERVICAL 01/22/2010  . TOBACCO ABUSE, HX OF 01/22/2010  . Generalized anxiety disorder 03/06/2009  . Hypogonadism in male 12/19/2008  . Hyperlipidemia 11/15/2008  . GERD 11/15/2008   Past Medical History:  Diagnosis Date  . Chronic neck pain 05/20/2012  . Family hx of ALS (amyotrophic lateral sclerosis) 05/27/2013  . GERD (gastroesophageal reflux disease)   . Hiatal hernia   . HLD (hyperlipidemia)   . Major depressive disorder, recurrent episode, moderate ([place]) 05/27/2013  . Neck pain    cervical spine with herniation  . Tobacco abuse   . Wrist fracture, left    Past Surgical History:  Procedure Laterality Date  . NECK SURGERY     x2- 2010, 2011   Social History   Socioeconomic History  . Marital status: Married    Spouse name: Not on file  . Number of children: 5  . Years of education: 32  . Highest education level: Not on file  Occupational History  . Not on file  Social Needs  . Financial resource strain: Not on file  . Food insecurity:    Worry: Not on file    Inability: Not on file  . Transportation needs:    Medical: Not on file    Non-medical: Not on file  Tobacco Use  . Smoking status: Former Smoker    Years: 30.00    Last attempt to quit: 02/03/2007    Years since quitting: 10.3  . Smokeless tobacco: Never  Used  Substance and Sexual Activity  . Alcohol use: No  . Drug use: No  . Sexual activity: Yes    Partners: Female  Lifestyle  . Physical activity:    Days per week: Not on file    Minutes per session: Not on file  . Stress: Not on file  Relationships  . Social connections:    Talks on phone: Not on file    Gets together: Not on file    Attends religious service: Not on file    Active member of club or organization: Not on file    Attends meetings of clubs or  organizations: Not on file    Relationship status: Not on file  . Intimate partner violence:    Fear of current or ex partner: Not on file    Emotionally abused: Not on file    Physically abused: Not on file    Forced sexual activity: Not on file  Other Topics Concern  . Not on file  Social History Narrative   Lives home with wife, Joelene Millin.  Education 12th grade.  Five children.   Family History  Problem Relation Age of Onset  . ALS Mother   . ALS Brother   . ALS Maternal Uncle   . ALS Maternal Aunt   . Colon cancer Neg Hx   . Esophageal cancer Neg Hx   . Rectal cancer Neg Hx   . Stomach cancer Neg Hx    Allergies  Allergen Reactions  . Methocarbamol Shortness Of Breath  . Gabapentin     Medication list has been reviewed and updated.   General: Denies fever, chills, sweats. No significant weight loss. Eyes: Denies blurring,significant itching ENT: Denies earache, sore throat, and hoarseness. Cardiovascular: Denies chest pains, palpitations, dyspnea on exertion Respiratory: Denies cough, dyspnea at rest,wheeezing Breast: no concerns about lumps GI: Denies nausea, vomiting, diarrhea, constipation, change in bowel habits, abdominal pain, melena, hematochezia GU: Denies penile discharge, ED, urinary flow / outflow problems. No STD concerns. Musculoskeletal: Denies back pain, joint pain Derm: Denies rash, itching Neuro: Denies  paresthesias, frequent falls, frequent headaches Psych: Having some ongoing depression, stress at home with wife. No SI or HI. Ongoing pain issues relate to this.  Endocrine: Denies cold intolerance, heat intolerance, polydipsia Heme: Denies enlarged lymph nodes Allergy: No hayfever  Objective:   BP 110/80   Pulse 86   Temp 98 F (36.7 C) (Oral)   Ht 5' 8.5" (1.74 m)   Wt 221 lb 8 oz (100.5 kg)   BMI 33.19 kg/m  Ideal Body Weight: Weight in (lb) to have BMI = 25: 166.5  No exam data present  GEN: well developed, well nourished, no  acute distress Eyes: conjunctiva and lids normal, PERRLA, EOMI ENT: TM clear, nares clear, oral exam WNL Neck: supple, no lymphadenopathy, no thyromegaly, no JVD Pulm: clear to auscultation and percussion, respiratory effort normal CV: regular rate and rhythm, S1-S2, no murmur, rub or gallop, no bruits, peripheral pulses normal and symmetric, no cyanosis, clubbing, edema or varicosities GI: soft, non-tender; no hepatosplenomegaly, masses; active bowel sounds all quadrants GU: no hernia, testicular mass, penile discharge Lymph: no cervical, axillary or inguinal adenopathy MSK: gait normal, muscle tone and strength WNL, no joint swelling, effusions, discoloration, crepitus  SKIN: clear, good turgor, color WNL, no rashes, lesions, or ulcerations Neuro: normal mental status, normal strength, sensation, and motion Psych: alert; oriented to person, place and time, normally interactive and not anxious or depressed in appearance.  All  labs reviewed with patient.  Lipids:    Component Value Date/Time   CHOL 183 06/11/2017 1619   TRIG 238 (H) 06/11/2017 1619   HDL 40 (L) 06/11/2017 1619   LDLDIRECT 96.0 10/10/2015 1128   VLDL 73.6 (H) 10/10/2015 1128   CHOLHDL 4.6 06/11/2017 1619   CBC: CBC Latest Ref Rng & Units 06/11/2017 06/03/2016 10/10/2015  WBC 3.8 - 10.8 Thousand/uL 6.6 6.1 7.7  Hemoglobin 13.2 - 17.1 g/dL 14.8 13.1 13.2  Hematocrit 38.5 - 50.0 % 42.4 39.5 40.1  Platelets 140 - 400 Thousand/uL 270 238.0 267.1    Basic Metabolic Panel:    Component Value Date/Time   NA 144 06/11/2017 1619   K 4.6 06/11/2017 1619   CL 105 06/11/2017 1619   CO2 27 06/11/2017 1619   BUN 14 06/11/2017 1619   CREATININE 1.31 06/11/2017 1619   GLUCOSE 93 06/11/2017 1619   CALCIUM 10.1 06/11/2017 1619   Hepatic Function Latest Ref Rng & Units 06/11/2017 06/03/2016 10/10/2015  Total Protein 6.1 - 8.1 g/dL 6.9 6.8 6.4  Albumin 3.5 - 5.2 g/dL - 4.0 3.9  AST 10 - 35 U/L 51(H) 43(H) 45(H)  ALT 9 - 46 U/L  71(H) 60(H) 62(H)  Alk Phosphatase 39 - 117 U/L - 95 109  Total Bilirubin 0.2 - 1.2 mg/dL 0.4 0.3 0.5  Bilirubin, Direct 0.0 - 0.2 mg/dL 0.1 0.1 0.1    Lab Results  Component Value Date   TSH 1.45 06/03/2016   Lab Results  Component Value Date   PSA 0.8 06/11/2017   PSA 0.60 10/10/2015   PSA 0.66 08/22/2014    Assessment and Plan:   Routine general medical examination at a health care facility   Doing ok. Ongoing pain with partial control with chronic opioids.  Depression continues on celexa and wellbutrin  Health Maintenance Exam: The patient's preventative maintenance and recommended screening tests for an annual wellness exam were reviewed in full today. Brought up to date unless services declined.  Counselled on the importance of diet, exercise, and its role in overall health and mortality. The patient's FH and SH was reviewed, including their home life, tobacco status, and drug and alcohol status.  Follow-up in 1 year for physical exam or additional follow-up below.  Follow-up: No follow-ups on file. Or follow-up in 1 year if not noted.  Signed,  Maud Deed. Duwan Adrian, MD   Allergies as of 06/23/2017      Reactions   Methocarbamol Shortness Of Breath   Gabapentin       Medication List        Accurate as of 06/23/17 10:05 AM. Always use your most recent med list.          aspirin 81 MG tablet Take 81 mg by mouth daily.   atorvastatin 80 MG tablet Commonly known as:  LIPITOR TAKE 1 TABLET BY MOUTH DAILY AT 6 PM.   buPROPion 150 MG 24 hr tablet Commonly known as:  WELLBUTRIN XL TAKE 1 TABLET BY MOUTH DAILY.   cholecalciferol 1000 units tablet Commonly known as:  VITAMIN D Take 2,000 Units by mouth daily.   citalopram 40 MG tablet Commonly known as:  CELEXA TAKE 1 TABLET BY MOUTH DAILY.   clonazePAM 0.5 MG tablet Commonly known as:  KLONOPIN Take 1 tablet (0.5 mg total) by mouth 2 (two) times daily as needed.   HYDROcodone-acetaminophen 10-325  MG tablet Commonly known as:  NORCO TAKE 1 TABLET BY MOUTH EVERY 6 HOURS AS NEEDED FOR SEVERE  PAIN   HYDROcodone-acetaminophen 10-325 MG tablet Commonly known as:  NORCO Take 1 tablet by mouth every 6 (six) hours as needed for severe pain.   HYDROcodone-acetaminophen 10-325 MG tablet Commonly known as:  NORCO Take 1 tablet by mouth every 6 (six) hours as needed for severe pain.   metFORMIN 500 MG 24 hr tablet Commonly known as:  GLUCOPHAGE-XR TAKE 1 TABLET BY MOUTH DAILY WITH BREAKFAST.   Omeprazole Magnesium 20.6 (20 Base) MG Cpdr Take 1-2 tablets by mouth daily.

## 2017-06-23 NOTE — Telephone Encounter (Signed)
Copied from [place] [id number]. Topic: General - Other >> Jun 23, 2017 10:45 AM Lennox Solders wrote: Reason for [facility] needs dx for hydrocodone. Pt was seen today

## 2017-06-23 NOTE — Telephone Encounter (Signed)
Rx was sent to pharmacy. 

## 2017-07-01 ENCOUNTER — Other Ambulatory Visit: Payer: Self-pay | Admitting: Family Medicine

## 2017-07-02 NOTE — Telephone Encounter (Signed)
Last office visit 06/23/2017.  Last refilled 12/02/2016 for #60 with 5 refills.  Ok to refill?

## 2017-07-23 ENCOUNTER — Telehealth: Payer: Self-pay

## 2017-07-23 NOTE — Telephone Encounter (Signed)
Patient's wife came by the office and asked to have Matthew Eaton call her back, it is about patient's disability and the information she needs to pass along for Dr Copland please. Her CB: [phone]. She did not elaborate further with me. Thank Edrick Kins, RMA

## 2017-07-23 NOTE — Telephone Encounter (Signed)
I called and spoke with Matthew Eaton.  She wanted to let Dr. Lorelei Pont know that Matthew Eaton has a new disability hearing on 11/23/2017.  She just wanted to give Dr. Lorelei Pont a heads up that he may be subpoenaed.  She is thinking he may just be asked to provide information on paper as to when and why Matthew Eaton is on depression and anxiety medication as well as why Dr. Lorelei Pont thinks Matthew Eaton is not capable on working.  She also just wanted to let him know there is also a possible he might have to appear in person at the hearing depending on what the judge wants.  This is just an FYI to make Dr. Lorelei Pont aware.

## 2017-07-24 NOTE — Telephone Encounter (Signed)
This is understood.

## 2017-08-04 ENCOUNTER — Other Ambulatory Visit: Payer: Self-pay | Admitting: Family Medicine

## 2017-08-07 ENCOUNTER — Other Ambulatory Visit: Payer: Self-pay | Admitting: Family Medicine

## 2017-09-01 ENCOUNTER — Telehealth: Payer: Self-pay | Admitting: *Deleted

## 2017-09-01 NOTE — Telephone Encounter (Signed)
Received fax from [facility] requesting PA for Hydrocodone-APAP 10-325 mg tablets.  PA completed on CoverMyMeds and approved effective from 09/01/2017 through 09/30/2017.  [facility] notified of approval via fax.

## 2017-09-21 NOTE — Progress Notes (Signed)
Dr. Frederico Hamman T. Tywon Niday, MD, [facility] [place], [address] Phone: [phone] Fax: [phone]  09/22/2017  Patient: Matthew Eaton, MRN: [id number], DOB: [birth year], 57 y.o.  Primary Physician:  Owens Loffler, MD   Chief Complaint  Patient presents with  . Pain Management   Subjective:   Matthew Eaton is a 57 y.o. very pleasant male patient who presents with the following:  Matthew Eaton is here for chronic pain management in a setting of failed neck syndrome x 2 neck surgeries.   Indication for chronic opioid: failed neck syndrome Medication and dose: norco 10 # pills per month: 120 Last UDS date: 11/2016 Opioid Treatment Agreement signed (Y/N):  Yes Opioid Treatment Agreement last reviewed with patient:  09/22/2017 [facility] reviewed this encounter (include red flags):   09/22/2017  Neck is a little better today, but all the way down the rest of his arms.  This is been an ongoing issue for many years, and he was followed by neurosurgery after 2 different surgeries in 2010 and 2011, and he was also followed by pain management initially by Dr. Brien Few.  Subsequently he was followed by Dr. Ace Gins, who ultimately discharged to the patient when she stopped taking medical insurance.  He then came into care of our medical practice long term for pain management as well as general medical care.  Mood, ups and down, but maybe doing better?  His mood is better than it was previously, and he is on Celexa 40 mg, Klonopin, as well as Wellbutrin XL 150 mg a day.  He and his wife both think he is a little bit less irritable now.  Mouth gets dry and feels tired and down.  Has an upcoming disability hearing, #2.  Past Medical History, Surgical History, Social History, Family History, Problem List, Medications, and Allergies have been reviewed and updated if relevant.  Patient Active Problem List   Diagnosis Date Noted  . Failed  neck syndrome 11/11/2016  . Chronic bilateral low back pain without sciatica 06/04/2016  . Vitamin D deficiency 10/15/2015  . Vitamin B12 deficiency 10/15/2015  . Family hx of ALS (amyotrophic lateral sclerosis) 05/27/2013  . Major depressive disorder, recurrent episode, moderate ([place]) 05/27/2013  . Obesity (BMI 30-39.9) 05/22/2013  . Insomnia 05/20/2012  . Chronic neck pain 05/20/2012  . Hiatal hernia 01/05/2011  . [place] DISEASE, CERVICAL 01/22/2010  . TOBACCO ABUSE, HX OF 01/22/2010  . Generalized anxiety disorder 03/06/2009  . Hypogonadism in male 12/19/2008  . Hyperlipidemia 11/15/2008  . GERD 11/15/2008    Past Medical History:  Diagnosis Date  . Chronic neck pain 05/20/2012  . Family hx of ALS (amyotrophic lateral sclerosis) 05/27/2013  . GERD (gastroesophageal reflux disease)   . Hiatal hernia   . HLD (hyperlipidemia)   . Major depressive disorder, recurrent episode, moderate ([place]) 05/27/2013  . Neck pain    cervical spine with herniation  . Tobacco abuse   . Wrist fracture, left     Past Surgical History:  Procedure Laterality Date  . NECK SURGERY     x2- 2010, 2011    Social History   Socioeconomic History  . Marital status: Married    Spouse name: Not on file  . Number of children: 5  . Years of education: 52  . Highest education level: Not on file  Occupational History  . Not on file  Social Needs  . Financial resource strain: Not on file  .  Food insecurity:    Worry: Not on file    Inability: Not on file  . Transportation needs:    Medical: Not on file    Non-medical: Not on file  Tobacco Use  . Smoking status: Former Smoker    Years: 30.00    Last attempt to quit: 02/03/2007    Years since quitting: 10.6  . Smokeless tobacco: Never Used  Substance and Sexual Activity  . Alcohol use: No  . Drug use: No  . Sexual activity: Yes    Partners: Female  Lifestyle  . Physical activity:    Days per week: Not on file    Minutes per session: Not on  file  . Stress: Not on file  Relationships  . Social connections:    Talks on phone: Not on file    Gets together: Not on file    Attends religious service: Not on file    Active member of club or organization: Not on file    Attends meetings of clubs or organizations: Not on file    Relationship status: Not on file  . Intimate partner violence:    Fear of current or ex partner: Not on file    Emotionally abused: Not on file    Physically abused: Not on file    Forced sexual activity: Not on file  Other Topics Concern  . Not on file  Social History Narrative   Lives home with wife, Matthew Eaton.  Education 12th grade.  Five children.    Family History  Problem Relation Age of Onset  . ALS Mother   . ALS Brother   . ALS Maternal Uncle   . ALS Maternal Aunt   . Colon cancer Neg Hx   . Esophageal cancer Neg Hx   . Rectal cancer Neg Hx   . Stomach cancer Neg Hx     Allergies  Allergen Reactions  . Methocarbamol Shortness Of Breath  . Gabapentin     Medication list reviewed and updated in full in [place].   GEN: No acute illnesses, no fevers, chills. GI: No n/v/d, eating normally Pulm: No SOB Interactive and getting along well at home.  Otherwise, ROS is as per the HPI.  Objective:   BP 100/68   Pulse 90   Temp 98.3 F (36.8 C) (Oral)   Ht 5' 8.5" (1.74 m)   Wt 223 lb 8 oz (101.4 kg)   BMI 33.49 kg/m   GEN: WDWN, NAD, Non-toxic, A & O x 3 HEENT: Atraumatic, Normocephalic. Neck supple. No masses, No LAD. Ears and Nose: No external deformity. CV: RRR, No M/G/R. No JVD. No thrill. No extra heart sounds. PULM: CTA B, no wheezes, crackles, rhonchi. No retractions. No resp. distress. No accessory muscle use. EXTR: No c/c/e NEURO Normal gait.  PSYCH: Normally interactive. Conversant. Not depressed or anxious appearing.  Calm demeanor.   Cervical spine: He does have an approximate 15 to 20% loss of motion in all directions.  Does have tenderness to  palpation globally throughout almost the entirety of the spine on the cervical aspect particularly in the posterior.  He also has some pain in the trapezius musculature as well as the rhomboids.  Laboratory and Imaging Data:  Assessment and Plan:   Failed neck syndrome  Chronic bilateral low back pain without sciatica  Major depressive disorder, recurrent episode, moderate (HCC)  Generalized anxiety disorder  Reasonably stable from a chronic pain standpoint status post 2 cervical spine surgeries with continued  chronic neck pain, and ongoing chronic back pain.  Mood is somewhat better, but he is still having some depression and mood irritability.  Follow-up: Return in about 3 months (around 12/23/2017).  Meds ordered this encounter  Medications  . HYDROcodone-acetaminophen (NORCO) 10-325 MG tablet    Sig: TAKE 1 TABLET BY MOUTH EVERY 6 HOURS AS NEEDED FOR SEVERE PAIN    Dispense:  120 tablet    Refill:  0    DO NOT FILL UNTIL 11/22/2017  . HYDROcodone-acetaminophen (NORCO) 10-325 MG tablet    Sig: Take 1 tablet by mouth every 6 (six) hours as needed for severe pain.    Dispense:  120 tablet    Refill:  0    DO NOT FILL UNTIL 10/23/2017  . HYDROcodone-acetaminophen (NORCO) 10-325 MG tablet    Sig: Take 1 tablet by mouth every 6 (six) hours as needed for severe pain.    Dispense:  120 tablet    Refill:  0   Signed,  Matthew Kuwahara T. Jarreau Callanan, MD   Allergies as of 09/22/2017      Reactions   Methocarbamol Shortness Of Breath   Gabapentin       Medication List        Accurate as of 09/22/17  2:11 PM. Always use your most recent med list.          aspirin 81 MG tablet Take 81 mg by mouth daily.   atorvastatin 80 MG tablet Commonly known as:  LIPITOR TAKE 1 TABLET BY MOUTH DAILY AT 6 PM.   buPROPion 150 MG 24 hr tablet Commonly known as:  WELLBUTRIN XL TAKE 1 TABLET BY MOUTH DAILY.   cholecalciferol 1000 units tablet Commonly known as:  VITAMIN D Take 2,000 Units  by mouth daily.   citalopram 40 MG tablet Commonly known as:  CELEXA TAKE 1 TABLET BY MOUTH DAILY.   clonazePAM 0.5 MG tablet Commonly known as:  KLONOPIN TAKE 1 TABLET BY MOUTH TWICE A DAY AS NEEDED   HYDROcodone-acetaminophen 10-325 MG tablet Commonly known as:  NORCO TAKE 1 TABLET BY MOUTH EVERY 6 HOURS AS NEEDED FOR SEVERE PAIN   HYDROcodone-acetaminophen 10-325 MG tablet Commonly known as:  NORCO Take 1 tablet by mouth every 6 (six) hours as needed for severe pain.   HYDROcodone-acetaminophen 10-325 MG tablet Commonly known as:  NORCO Take 1 tablet by mouth every 6 (six) hours as needed for severe pain.   metFORMIN 500 MG 24 hr tablet Commonly known as:  GLUCOPHAGE-XR TAKE 1 TABLET BY MOUTH DAILY WITH BREAKFAST.   omeprazole 20 MG capsule Commonly known as:  PRILOSEC TAKE 1 CAPSULE BY MOUTH 2 TIMES DAILY BEFORE A MEAL.   Omeprazole Magnesium 20.6 (20 Base) MG Cpdr Take 1-2 tablets by mouth daily.

## 2017-09-22 ENCOUNTER — Telehealth: Payer: Self-pay | Admitting: Family Medicine

## 2017-09-22 ENCOUNTER — Encounter: Payer: Self-pay | Admitting: Family Medicine

## 2017-09-22 ENCOUNTER — Ambulatory Visit: Payer: BLUE CROSS/BLUE SHIELD | Admitting: Family Medicine

## 2017-09-22 VITALS — BP 100/68 | HR 90 | Temp 98.3°F | Ht 68.5 in | Wt 223.5 lb

## 2017-09-22 DIAGNOSIS — M961 Postlaminectomy syndrome, not elsewhere classified: Secondary | ICD-10-CM | POA: Diagnosis not present

## 2017-09-22 DIAGNOSIS — F331 Major depressive disorder, recurrent, moderate: Secondary | ICD-10-CM | POA: Diagnosis not present

## 2017-09-22 DIAGNOSIS — M545 Low back pain, unspecified: Secondary | ICD-10-CM

## 2017-09-22 DIAGNOSIS — F411 Generalized anxiety disorder: Secondary | ICD-10-CM

## 2017-09-22 DIAGNOSIS — G8929 Other chronic pain: Secondary | ICD-10-CM

## 2017-09-22 MED ORDER — HYDROCODONE-ACETAMINOPHEN 10-325 MG PO TABS: 1.0000 | ORAL_TABLET | Freq: Four times a day (QID) | ORAL | 0 refills | Status: DC | PRN

## 2017-09-22 MED ORDER — HYDROCODONE-ACETAMINOPHEN 10-325 MG PO TABS: ORAL_TABLET | ORAL | 0 refills | Status: DC

## 2017-09-22 NOTE — Telephone Encounter (Signed)
M96.1 Failed neck syndrome

## 2017-09-22 NOTE — Telephone Encounter (Signed)
Copied from [place] [id number]. Topic: General - Other >> Sep 22, 2017  2:35 PM Cecelia Byars, NT wrote: Reason for CRM: *[facility] called and would like the icd 10 code for  HYDROcodone-acetaminophen [facility]) 10-325 MG tablet, [place], [place] [phone] (Phone) [phone] (Fax)

## 2017-09-22 NOTE — Telephone Encounter (Signed)
[facility] notified by telephone ICD 10 code M96.1 Failed Neck Syndrome.

## 2017-11-19 ENCOUNTER — Other Ambulatory Visit: Payer: Self-pay | Admitting: Family Medicine

## 2017-12-13 ENCOUNTER — Telehealth: Payer: Self-pay | Admitting: Family Medicine

## 2017-12-13 NOTE — Telephone Encounter (Signed)
[name] Can you look at this pt for me.  It this a 3 month follow up for meds.  Or a sport med appointment?

## 2017-12-13 NOTE — Telephone Encounter (Signed)
Appointment 11/22 with dr Diona Browner Pt aware

## 2017-12-13 NOTE — Telephone Encounter (Signed)
Should be a 3 month pain management appointment.

## 2017-12-17 ENCOUNTER — Other Ambulatory Visit: Payer: Self-pay | Admitting: Family Medicine

## 2017-12-17 NOTE — Telephone Encounter (Signed)
Last filled 11-19-17 #60 Last OV 09-22-17 Next OV 12-24-17

## 2017-12-18 ENCOUNTER — Other Ambulatory Visit: Payer: Self-pay | Admitting: Family Medicine

## 2017-12-23 ENCOUNTER — Ambulatory Visit: Payer: Self-pay | Admitting: Family Medicine

## 2017-12-24 ENCOUNTER — Encounter: Payer: Self-pay | Admitting: Family Medicine

## 2017-12-24 ENCOUNTER — Ambulatory Visit: Payer: Self-pay | Admitting: Family Medicine

## 2017-12-24 VITALS — BP 90/70 | HR 97 | Temp 98.3°F | Ht 68.5 in | Wt 221.2 lb

## 2017-12-24 DIAGNOSIS — E119 Type 2 diabetes mellitus without complications: Secondary | ICD-10-CM

## 2017-12-24 DIAGNOSIS — R52 Pain, unspecified: Secondary | ICD-10-CM | POA: Insufficient documentation

## 2017-12-24 DIAGNOSIS — F1190 Opioid use, unspecified, uncomplicated: Secondary | ICD-10-CM | POA: Insufficient documentation

## 2017-12-24 DIAGNOSIS — M542 Cervicalgia: Secondary | ICD-10-CM

## 2017-12-24 DIAGNOSIS — M545 Low back pain: Secondary | ICD-10-CM

## 2017-12-24 DIAGNOSIS — Z23 Encounter for immunization: Secondary | ICD-10-CM

## 2017-12-24 DIAGNOSIS — G8929 Other chronic pain: Secondary | ICD-10-CM

## 2017-12-24 LAB — HM DIABETES FOOT EXAM

## 2017-12-24 MED ORDER — HYDROCODONE-ACETAMINOPHEN 10-325 MG PO TABS: ORAL_TABLET | ORAL | 0 refills | Status: DC

## 2017-12-24 MED ORDER — HYDROCODONE-ACETAMINOPHEN 10-325 MG PO TABS: 1.0000 | ORAL_TABLET | Freq: Four times a day (QID) | ORAL | 0 refills | Status: DC | PRN

## 2017-12-24 NOTE — Patient Instructions (Addendum)
Work on low carbohydrate diet. Stop [employer] or change to diet.  Start some type of low impact exercise.   Please stop at the lab to have labs drawn.

## 2017-12-24 NOTE — Assessment & Plan Note (Signed)
Well controlled. Continue current medication.  reviewed Dx, complications and recommendations.  Encouraged eye exam yeearly.. A1C at next OV,  Performed foot exam. Encouraged PNA vaccine

## 2017-12-24 NOTE — Progress Notes (Signed)
   Subjective:    Patient ID: Matthew Eaton, male    DOB: April 17, 1960, 57 y.o.   MRN: [id number]  HPI   57 year old male presents for chronic pain management.  Indication for chronic opioid:  Chronic bilateral low back pain, chronic neck pain failed neck syndrome  Tolerable control overall. Medication and dose: norco 10 # pills per month: 120 Last UDS date: due today. Opioid Treatment Agreement signed (Y/N): Y today Opioid Treatment Agreement last reviewed with patient:  Y today [place] reviewed this encounter (include red flags):  No red flags 12/24/2017   In last 2 weeks he has been having pain in left arm. Achy, mainly in lower arm, no numbness, no weakness.  Not worse with neck move,ment.   He is doing some stretching.Marland Kitchen Helps it resolve.  Diabetes:  Pt reports not being aware of DM diagnosis ( was told per D.r Copland notes after labs on phone call 06/2016. Now on metformin. Well controlled at last check in 06/2017 Lab Results  Component Value Date   HGBA1C 6.3 (H) 06/11/2017  Using medications without difficulties: Hypoglycemic episodes: Hyperglycemic episodes: Feet problems:none Blood Sugars averaging: not checking eye exam within last year: yes.. Will try to get records.   Blood pressure 90/70, pulse 97, temperature 98.3 F (36.8 C), temperature source Oral, height 5' 8.5" (1.74 m), weight 221 lb 4 oz (100.4 kg). Social History /Family History/Past Medical History reviewed in detail and updated in EMR if needed.   Review of Systems  Constitutional: Negative for fatigue and fever.  HENT: Negative for ear pain.   Eyes: Negative for pain.  Respiratory: Negative for cough and shortness of breath.   Cardiovascular: Negative for chest pain, palpitations and leg swelling.  Gastrointestinal: Negative for abdominal pain.  Genitourinary: Negative for dysuria.  Musculoskeletal: Positive for back pain. Negative for arthralgias.  Neurological: Negative for syncope,  light-headedness and headaches.  Psychiatric/Behavioral: Negative for dysphoric mood.       Objective:   Physical Exam  Constitutional: Vital signs are normal. He appears well-developed and well-nourished.  HENT:  Head: Normocephalic.  Right Ear: Hearing normal.  Left Ear: Hearing normal.  Nose: Nose normal.  Mouth/Throat: Oropharynx is clear and moist and mucous membranes are normal.  Neck: Trachea normal. Carotid bruit is not present. No thyroid mass and no thyromegaly present.  Cardiovascular: Normal rate, regular rhythm and normal pulses. Exam reveals no gallop, no distant heart sounds and no friction rub.  No murmur heard. No peripheral edema  Pulmonary/Chest: Effort normal and breath sounds normal. No respiratory distress.  Skin: Skin is warm, dry and intact. No rash noted.  Psychiatric: He has a normal mood and affect. His speech is normal and behavior is normal. Thought content normal.      Diabetic foot exam: Normal inspection No skin breakdown No calluses  Normal DP pulses Normal sensation to light touch and monofilament Nails normal     Assessment & Plan:

## 2017-12-24 NOTE — Assessment & Plan Note (Signed)
Indication for chronic opioid:  Chronic bialtearl low back pain, chronic neck pain failed neck syndrome Medication and dose: norco 10 # pills per month: 120 Last UDS date: due today. Opioid Treatment Agreement signed (Y/N): Y today Opioid Treatment Agreement last reviewed with patient:  Y today [place] reviewed this encounter (include red flags):  No red flags 12/24/2017

## 2017-12-28 LAB — PAIN MGMT, PROFILE 8 W/CONF, U
6 Acetylmorphine: NEGATIVE ng/mL (ref <10)
ALCOHOL METABOLITES: POSITIVE ng/mL — AB (ref <500)
AMINOCLONAZEPAM: 846 ng/mL — AB (ref <25)
AMPHETAMINES: NEGATIVE ng/mL (ref <500)
Alphahydroxyalprazolam: NEGATIVE ng/mL (ref <25)
Alphahydroxymidazolam: NEGATIVE ng/mL (ref <50)
Alphahydroxytriazolam: NEGATIVE ng/mL (ref <50)
BENZODIAZEPINES: POSITIVE ng/mL — AB (ref <100)
BUPRENORPHINE, URINE: NEGATIVE ng/mL (ref <5)
Cocaine Metabolite: NEGATIVE ng/mL (ref <150)
Codeine: NEGATIVE ng/mL (ref <50)
Creatinine: 213.8 mg/dL
ETHYL GLUCURONIDE (ETG): 6993 ng/mL — AB (ref <500)
Ethyl Sulfate (ETS): 727 ng/mL — ABNORMAL HIGH (ref <100)
HYDROMORPHONE: 250 ng/mL — AB (ref <50)
HYDROXYETHYLFLURAZEPAM: NEGATIVE ng/mL (ref <50)
Hydrocodone: 5896 ng/mL — ABNORMAL HIGH (ref <50)
Lorazepam: NEGATIVE ng/mL (ref <50)
MARIJUANA METABOLITE: POSITIVE ng/mL — AB (ref <20)
MDMA: NEGATIVE ng/mL (ref <500)
Marijuana Metabolite: 9 ng/mL — ABNORMAL HIGH (ref <5)
Morphine: NEGATIVE ng/mL (ref <50)
Nordiazepam: NEGATIVE ng/mL (ref <50)
Norhydrocodone: 6863 ng/mL — ABNORMAL HIGH (ref <50)
OPIATES: POSITIVE ng/mL — AB (ref <100)
OXIDANT: NEGATIVE ug/mL (ref <200)
Oxazepam: NEGATIVE ng/mL (ref <50)
Oxycodone: NEGATIVE ng/mL (ref <100)
Temazepam: NEGATIVE ng/mL (ref <50)
pH: 6.41 (ref 4.5–9.0)

## 2018-01-14 ENCOUNTER — Other Ambulatory Visit: Payer: Self-pay | Admitting: Family Medicine

## 2018-01-14 NOTE — Telephone Encounter (Signed)
Last office visit 12/24/2017 with Dr. Diona Browner for pain management.  Last refilled 12/17/2017 for #60 with no refills.  No future appointments.  UDS/Contract 12/24/2017.

## 2018-01-15 ENCOUNTER — Other Ambulatory Visit: Payer: Self-pay | Admitting: Family Medicine

## 2018-01-24 ENCOUNTER — Other Ambulatory Visit: Payer: Self-pay | Admitting: Family Medicine

## 2018-01-29 ENCOUNTER — Other Ambulatory Visit: Payer: Self-pay | Admitting: Family Medicine

## 2018-02-25 ENCOUNTER — Telehealth: Payer: Self-pay | Admitting: *Deleted

## 2018-02-25 NOTE — Telephone Encounter (Signed)
Received email from [place] requesting PA for Hydrocodone-APAP 10-325 mg.  PA completed on CoverMyMeds.  Sent for review.  Can take 72 hours for a decision.

## 2018-02-28 NOTE — Telephone Encounter (Signed)
PA approved through 03/11/2018.

## 2018-03-25 ENCOUNTER — Other Ambulatory Visit: Payer: Self-pay | Admitting: Family Medicine

## 2018-03-30 ENCOUNTER — Other Ambulatory Visit: Payer: Self-pay | Admitting: Family Medicine

## 2018-03-31 ENCOUNTER — Encounter: Payer: Self-pay | Admitting: Family Medicine

## 2018-03-31 ENCOUNTER — Ambulatory Visit (INDEPENDENT_AMBULATORY_CARE_PROVIDER_SITE_OTHER): Payer: PPO | Admitting: Family Medicine

## 2018-03-31 VITALS — BP 108/64 | HR 70 | Temp 97.7°F | Ht 68.5 in

## 2018-03-31 DIAGNOSIS — M961 Postlaminectomy syndrome, not elsewhere classified: Secondary | ICD-10-CM

## 2018-03-31 DIAGNOSIS — R52 Pain, unspecified: Secondary | ICD-10-CM

## 2018-03-31 MED ORDER — HYDROCODONE-ACETAMINOPHEN 10-325 MG PO TABS: 1.0000 | ORAL_TABLET | Freq: Four times a day (QID) | ORAL | 0 refills | Status: DC | PRN

## 2018-03-31 MED ORDER — HYDROCODONE-ACETAMINOPHEN 10-325 MG PO TABS: ORAL_TABLET | ORAL | 0 refills | Status: DC

## 2018-03-31 NOTE — Progress Notes (Signed)
Dr. Frederico Hamman T. Abbrielle Batts, MD, [facility] [place], [address] Phone: [phone] Fax: [phone]  03/31/2018  Patient: Matthew Eaton, MRN: [id number], DOB: 07-Oct-1960, 58 y.o.  Primary Physician:  Owens Loffler, MD   Chief Complaint  Patient presents with  . Pain management    UDS/Contract 12/24/2017   Subjective:   Matthew Eaton is a 57 y.o. very pleasant male patient who presents with the following:  Indication for chronic opioid: failed neck syndrome Medication and dose: norco 10 # pills per month: 120 Last UDS date: 12/2017 Opioid Treatment Agreement signed (Y/N):  Yes Opioid Treatment Agreement last reviewed with patient:  03/31/18 [facility] reviewed this encounter (include red flags):   03/31/18 10:03 AM   Not drinking every day. Every once in a while.  Neck and hurting all over most of the time Sometimes out to the shoulders - then HA No numbness or tingling  Mood - will leave if irritable Thinking about leaving House not getting cared for too well  Past Medical History, Surgical History, Social History, Family History, Problem List, Medications, and Allergies have been reviewed and updated if relevant.  Patient Active Problem List   Diagnosis Date Noted  . Pain management 12/24/2017  . Chronic, continuous use of opioids 12/24/2017  . Diabetes mellitus type 2, uncomplicated ([place]) 73/22/0254  . 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 Chronic bilateral low back pain without sciatica 06/04/2016  . Vitamin D deficiency 10/15/2015  . Vitamin B12 deficiency 10/15/2015  . Family hx of ALS (amyotrophic lateral sclerosis) 05/27/2013  . Major depressive disorder, recurrent episode, moderate (Matthew Eaton) 05/27/2013  . Obesity (BMI 30-39.9) 05/22/2013  . Insomnia 05/20/2012  . Chronic neck pain 05/20/2012  . Hiatal hernia 01/05/2011  . [place] DISEASE, CERVICAL 01/22/2010  . TOBACCO ABUSE, HX OF 01/22/2010    . Generalized anxiety disorder 03/06/2009  . Hypogonadism in male 12/19/2008  . Hyperlipidemia 11/15/2008  . GERD 11/15/2008    Past Medical History:  Diagnosis Date  . Chronic neck pain 05/20/2012  . Family hx of ALS (amyotrophic lateral sclerosis) 05/27/2013  . GERD (gastroesophageal reflux disease)   . Hiatal hernia   . HLD (hyperlipidemia)   . Major depressive disorder, recurrent episode, moderate (Matthew Eaton) 05/27/2013  . Neck pain    cervical spine with herniation  . Tobacco abuse   . Wrist fracture, left     Past Surgical History:  Procedure Laterality Date  . NECK SURGERY     x2- 2010, 2011    Social History   Socioeconomic History  . Marital status: Married    Spouse name: Not on file  . Number of children: 5  . Years of education: 35  . Highest education level: Not on file  Occupational History  . Not on file  Social Needs  . Financial resource strain: Not on file  . Food insecurity:    Worry: Not on file    Inability: Not on file  . Transportation needs:    Medical: Not on file    Non-medical: Not on file  Tobacco Use  . Smoking status: Former Smoker    Years: 30.00    Last attempt to quit: 02/03/2007    Years since quitting: 11.1  . Smokeless tobacco: Never Used  Substance and Sexual Activity  . Alcohol use: No  . Drug use: No  . Sexual activity: Yes    Partners: Female  Lifestyle  . Physical activity:  Days per week: Not on file    Minutes per session: Not on file  . Stress: Not on file  Relationships  . Social connections:    Talks on phone: Not on file    Gets together: Not on file    Attends religious service: Not on file    Active member of club or organization: Not on file    Attends meetings of clubs or organizations: Not on file    Relationship status: Not on file  . Intimate partner violence:    Fear of current or ex partner: Not on file    Emotionally abused: Not on file    Physically abused: Not on file    Forced sexual activity:  Not on file  Other Topics Concern  . Not on file  Social History Narrative   Lives home with wife, Matthew Eaton.  Education 12th grade.  Five children.    Family History  Problem Relation Age of Onset  . ALS Mother   . ALS Brother   . ALS Maternal Uncle   . ALS Maternal Aunt   . Colon cancer Neg Hx   . Esophageal cancer Neg Hx   . Rectal cancer Neg Hx   . Stomach cancer Neg Hx     Allergies  Allergen Reactions  . Methocarbamol Shortness Of Breath  . Gabapentin     Medication list reviewed and updated in full in [facility].   GEN: No acute illnesses, no fevers, chills. GI: No n/v/d, eating normally Pulm: No SOB Interactive and getting along well at home.  Otherwise, ROS is as per the HPI.  Objective:   BP 108/64   Pulse 70   Temp 97.7 F (36.5 C) (Oral)   Ht 5' 8.5" (1.74 m)   SpO2 91%   BMI 33.15 kg/m   GEN: WDWN, NAD, Non-toxic, A & O x 3 HEENT: Atraumatic, Normocephalic. Neck supple. No masses, No LAD. Ears and Nose: No external deformity. CV: RRR, No M/G/R. No JVD. No thrill. No extra heart sounds. PULM: CTA B, no wheezes, crackles, rhonchi. No retractions. No resp. distress. No accessory muscle use. EXTR: No c/c/e NEURO Normal gait.  PSYCH: Normally interactive. Conversant. Not depressed or anxious appearing.  Calm demeanor.   He does have about 30% loss of motion, most notably in extension.  Rotational movements is probably even greater than this about 35 to 40%.  Does have some pain with terminal motion.  No neurovascular changes.  Laboratory and Imaging Data:  Assessment and Plan:   Failed neck syndrome  Pain management  Stable failed neck syndrome.  Refill pain medication with follow-up physical in 3 months.  Follow-up: Return in about 3 months (around 06/29/2018).  Meds ordered this encounter  Medications  . HYDROcodone-acetaminophen (NORCO) 10-325 MG tablet    Sig: Take 1 tablet by mouth every 6 (six) hours as needed for severe pain.      Dispense:  120 tablet    Refill:  0    DO NOT FILL UNTIL 05/30/2018  . HYDROcodone-acetaminophen (NORCO) 10-325 MG tablet    Sig: TAKE 1 TABLET BY MOUTH EVERY 6 HOURS AS NEEDED FOR SEVERE PAIN    Dispense:  120 tablet    Refill:  0    DO NOT FILL UNTIL 04/29/2018  . HYDROcodone-acetaminophen (NORCO) 10-325 MG tablet    Sig: Take 1 tablet by mouth every 6 (six) hours as needed for severe pain.    Dispense:  120 tablet  Refill:  0   No orders of the defined types were placed in this encounter.   Signed,  Maud Deed. Airianna Kreischer, MD   Outpatient Encounter Medications as of 03/31/2018  Medication Sig  . aspirin 81 MG tablet Take 81 mg by mouth daily.    Marland Kitchen atorvastatin (LIPITOR) 80 MG tablet TAKE 1 TABLET BY MOUTH DAILY AT 6 PM.  . buPROPion (WELLBUTRIN XL) 150 MG 24 hr tablet TAKE 1 TABLET BY MOUTH DAILY.  . cholecalciferol (VITAMIN D) 1000 units tablet Take 2,000 Units by mouth daily.  . citalopram (CELEXA) 40 MG tablet TAKE 1 TABLET BY MOUTH DAILY.  . clonazePAM (KLONOPIN) 0.5 MG tablet TAKE 1 TABLET BY MOUTH TWICE A DAY AS NEEDED  . HYDROcodone-acetaminophen (NORCO) 10-325 MG tablet Take 1 tablet by mouth every 6 (six) hours as needed for severe pain.  Marland Kitchen HYDROcodone-acetaminophen (NORCO) 10-325 MG tablet TAKE 1 TABLET BY MOUTH EVERY 6 HOURS AS NEEDED FOR SEVERE PAIN  . HYDROcodone-acetaminophen (NORCO) 10-325 MG tablet Take 1 tablet by mouth every 6 (six) hours as needed for severe pain.  Marland Kitchen omeprazole (PRILOSEC) 20 MG capsule TAKE 1 CAPSULE BY MOUTH 2 TIMES DAILY BEFORE A MEAL.  Marland Kitchen Omeprazole Magnesium 20.6 (20 BASE) MG CPDR Take 1-2 tablets by mouth daily.  . [DISCONTINUED] HYDROcodone-acetaminophen (NORCO) 10-325 MG tablet TAKE 1 TABLET BY MOUTH EVERY 6 HOURS AS NEEDED FOR SEVERE PAIN  . [DISCONTINUED] HYDROcodone-acetaminophen (NORCO) 10-325 MG tablet Take 1 tablet by mouth every 6 (six) hours as needed for severe pain.  . [DISCONTINUED] HYDROcodone-acetaminophen (NORCO) 10-325  MG tablet Take 1 tablet by mouth every 6 (six) hours as needed for severe pain.  . [DISCONTINUED] metFORMIN (GLUCOPHAGE-XR) 500 MG 24 hr tablet TAKE 1 TABLET BY MOUTH DAILY WITH BREAKFAST.   No facility-administered encounter medications on file as of 03/31/2018.

## 2018-04-22 ENCOUNTER — Ambulatory Visit (INDEPENDENT_AMBULATORY_CARE_PROVIDER_SITE_OTHER): Payer: PPO

## 2018-04-22 ENCOUNTER — Ambulatory Visit (HOSPITAL_COMMUNITY)
Admission: EM | Admit: 2018-04-22 | Discharge: 2018-04-22 | Disposition: A | Discharge status: Home / Self Care | Payer: PPO | Attending: Family Medicine | Admitting: Family Medicine

## 2018-04-22 ENCOUNTER — Encounter (HOSPITAL_COMMUNITY): Payer: Self-pay

## 2018-04-22 ENCOUNTER — Telehealth: Payer: Self-pay

## 2018-04-22 ENCOUNTER — Other Ambulatory Visit: Payer: Self-pay

## 2018-04-22 DIAGNOSIS — M545 Low back pain, unspecified: Secondary | ICD-10-CM

## 2018-04-22 DIAGNOSIS — M4186 Other forms of scoliosis, lumbar region: Secondary | ICD-10-CM

## 2018-04-22 MED ORDER — METHYLPREDNISOLONE 4 MG PO TBPK: ORAL_TABLET | ORAL | 0 refills | Status: DC

## 2018-04-22 MED ORDER — KETOROLAC TROMETHAMINE 60 MG/2ML IM SOLN
INTRAMUSCULAR | Status: AC
  Filled 2018-04-22: qty 2

## 2018-04-22 MED ORDER — KETOROLAC TROMETHAMINE 60 MG/2ML IM SOLN
60.0000 mg | Freq: Once | INTRAMUSCULAR | Status: AC
  Administered 2018-04-22: 60 mg via INTRAMUSCULAR

## 2018-04-22 NOTE — ED Notes (Signed)
Patient able to ambulate independently  

## 2018-04-22 NOTE — Telephone Encounter (Signed)
Medrol dose pack is good medication for back pain. No interactions with his other medications.  He does need to follow his blood sugar closely on the steroid though if he has testing available.. call if FBS > 180  Lab Results  Component Value Date   HGBA1C 6.3 (H) 06/11/2017

## 2018-04-22 NOTE — ED Triage Notes (Signed)
Pt present he went to bend over and his stool rolled underneath him and he fall and injured his lower back area. Pt states this happen on Tuesday.

## 2018-04-22 NOTE — Discharge Instructions (Signed)
Your xray is normal today. Please complete medrol dosepak.  Please follow up with your primary care provider to continue to manage your pain.

## 2018-04-22 NOTE — Telephone Encounter (Signed)
Patient went to Urgent Care today 04/22/2018-notes in epic, due to fall and back pain. Patient was given Medrol Dosepack 4 mg. Patient wanted to make sure this is ok to take with his other medications and his health issues. If this is not safe to take for him what else can he take or be prescribed? Patient does take Hydrocodone for his neck pain but states this medication does not help back pain he is having at all. ADvised patient's wife, Matthew Eaton, that Dr. Lorelei Pont is not in the office and I will send it to another provider to review in his abcense.  OK per DPR on file.

## 2018-04-22 NOTE — ED Provider Notes (Signed)
[facility]    CSN: [id number] Arrival date & time: 04/22/18  7681     History   Chief Complaint Chief Complaint  Patient presents with  . Fall  . Back Pain    HPI Matthew Eaton is a 58 y.o. male.   Matthew Eaton presents with complaints of midline low back pain after he accidentally fell off of a chair 3 days ago. He had leaned forward and the chair slid out from under him, he landed on buttocks. Pain after which has worsened. No radiation of pain. No saddle paresthesia. No loss of bladder or bowel. No numbness, tingling or weakness to legs. Pain worse with walking and initiating activity such as getting out of bed. Hx of chronic neck pain, is on a pain contract. States he has been taking oxycodone which has not helped with his symptoms. Pain is 7/10. Hx of chronic neck pain, gerd, dm. He is not on any medications for DM.     ROS per HPI, negative if not otherwise mentioned.      Past Medical History:  Diagnosis Date  . Chronic neck pain 05/20/2012  . Family hx of ALS (amyotrophic lateral sclerosis) 05/27/2013  . GERD (gastroesophageal reflux disease)   . Hiatal hernia   . HLD (hyperlipidemia)   . Major depressive disorder, recurrent episode, moderate ([place]) 05/27/2013  . Neck pain    cervical spine with herniation  . Tobacco abuse   . Wrist fracture, left     Patient Active Problem List   Diagnosis Date Noted  . Pain management 12/24/2017  . Chronic, continuous use of opioids 12/24/2017  . Diabetes mellitus type 2, uncomplicated ([place]) 15/72/6203  . 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 Chronic bilateral low back pain without sciatica 06/04/2016  . Vitamin D deficiency 10/15/2015  . Vitamin B12 deficiency 10/15/2015  . Family hx of ALS (amyotrophic lateral sclerosis) 05/27/2013  . Major depressive disorder, recurrent episode, moderate ([place]) 05/27/2013  . Obesity (BMI 30-39.9) 05/22/2013  . Insomnia 05/20/2012  . Hiatal hernia 01/05/2011  . [place] DISEASE,  CERVICAL 01/22/2010  . TOBACCO ABUSE, HX OF 01/22/2010  . Generalized anxiety disorder 03/06/2009  . Hypogonadism in male 12/19/2008  . Hyperlipidemia 11/15/2008  . GERD 11/15/2008    Past Surgical History:  Procedure Laterality Date  . NECK SURGERY     x2- 2010, 2011       Home Medications    Prior to Admission medications   Medication Sig Start Date End Date Taking? Authorizing Provider  aspirin 81 MG tablet Take 81 mg by mouth daily.      [provider]  atorvastatin (LIPITOR) 80 MG tablet TAKE 1 TABLET BY MOUTH DAILY AT 6 PM. 07/02/17   Copland, Frederico Hamman, MD  buPROPion (WELLBUTRIN XL) 150 MG 24 hr tablet TAKE 1 TABLET BY MOUTH DAILY. 11/19/17   Copland, Frederico Hamman, MD  cholecalciferol (VITAMIN D) 1000 units tablet Take 2,000 Units by mouth daily.    [provider]  citalopram (CELEXA) 40 MG tablet TAKE 1 TABLET BY MOUTH DAILY. 01/31/18   Copland, Frederico Hamman, MD  clonazePAM (KLONOPIN) 0.5 MG tablet TAKE 1 TABLET BY MOUTH TWICE A DAY AS NEEDED 01/14/18   Copland, Frederico Hamman, MD  HYDROcodone-acetaminophen (NORCO) 10-325 MG tablet Take 1 tablet by mouth every 6 (six) hours as needed for severe pain. 03/31/18   Copland, Frederico Hamman, MD  HYDROcodone-acetaminophen (NORCO) 10-325 MG tablet TAKE 1 TABLET BY MOUTH EVERY 6 HOURS AS NEEDED FOR SEVERE PAIN 03/31/18  Copland, Frederico Hamman, MD  HYDROcodone-acetaminophen (NORCO) 10-325 MG tablet Take 1 tablet by mouth every 6 (six) hours as needed for severe pain. 03/31/18   Copland, Frederico Hamman, MD  methylPREDNISolone (MEDROL DOSEPAK) 4 MG TBPK tablet Per box instructions 04/22/18   Augusto Gamble B, NP  omeprazole (PRILOSEC) 20 MG capsule TAKE 1 CAPSULE BY MOUTH 2 TIMES DAILY BEFORE A MEAL. 06/24/17   Copland, Frederico Hamman, MD  Omeprazole Magnesium 20.6 (20 BASE) MG CPDR Take 1-2 tablets by mouth daily.    [provider]    Family History Family History  Problem Relation Age of Onset  . ALS Mother   . ALS Brother   . ALS Maternal Uncle    . ALS Maternal Aunt   . Colon cancer Neg Hx   . Esophageal cancer Neg Hx   . Rectal cancer Neg Hx   . Stomach cancer Neg Hx     Social History Social History   Tobacco Use  . Smoking status: Former Smoker    Years: 30.00    Last attempt to quit: 02/03/2007    Years since quitting: 11.2  . Smokeless tobacco: Never Used  Substance Use Topics  . Alcohol use: No  . Drug use: No     Allergies   Methocarbamol and Gabapentin   Review of Systems Review of Systems   Physical Exam Triage Vital Signs ED Triage Vitals [04/22/18 0927]  Enc Vitals Group     BP (!) 137/99     Pulse Rate 86     Resp 16     Temp 97.6 F (36.4 C)     Temp Source Oral     SpO2 95 %     Weight      Height      Head Circumference      Peak Flow      Pain Score 7     Pain Loc      Pain Edu?      Excl. in [place]?    No data found.  Updated Vital Signs BP (!) 137/99 (BP Location: Right Arm)   Pulse 86   Temp 97.6 F (36.4 C) (Oral)   Resp 16   SpO2 95%    Physical Exam Constitutional:      Appearance: He is well-developed.  Cardiovascular:     Rate and Rhythm: Normal rate and regular rhythm.  Pulmonary:     Effort: Pulmonary effort is normal.     Breath sounds: Normal breath sounds.  Musculoskeletal:     Lumbar back: He exhibits tenderness, bony tenderness and pain. He exhibits no swelling, no edema, no deformity, no laceration, no spasm and normal pulse.     Comments: pain with transition from sit to lay and lay to sit; strength equal bilaterally; gross sensation intact to lower extremities; bilateral low back as well as midline lumbar tenderness; positive straight leg raise bilaterally; ambulatory without difficulty   Skin:    General: Skin is warm and dry.  Neurological:     Mental Status: He is alert and oriented to person, place, and time.      UC Treatments / Results  Labs (all labs ordered are listed, but only abnormal results are displayed) Labs Reviewed - No data to  display  EKG None  Radiology Dg Lumbar Spine Complete  Result Date: 04/22/2018 CLINICAL DATA:  Pain after fall from stool EXAM: LUMBAR SPINE - COMPLETE 4+ VIEW COMPARISON:  Lumbar spine MRI 08/29/2016 FINDINGS: There is no evidence of  lumbar spine fracture or traumatic malalignment. Mild lumbar levocurvature, also seen on prior MRI. No focal or advanced disc narrowing. Mild scattered endplate spurring. Atherosclerotic calcification of the aorta IMPRESSION: No acute finding. Electronically Signed   By: Monte Fantasia M.D.   On: 04/22/2018 10:13    Procedures Procedures (including critical care time)  Medications Ordered in UC Medications  ketorolac (TORADOL) injection 60 mg (60 mg Intramuscular Given 04/22/18 0950)    Initial Impression / Assessment and Plan / UC Course  I have reviewed the triage vital signs and the nursing notes.  Pertinent labs & imaging results that were available during my care of the patient were reviewed by me and considered in my medical decision making (see chart for details).     Xray without acute findings today s/p fall. Chronic pain. toradol im provided today with medrol pack for acute pain. Encouraged follow up with PCP for management. Return precautions provided. Patient verbalized understanding and agreeable to plan.  Ambulatory out of clinic without difficulty.    Final Clinical Impressions(s) / UC Diagnoses   Final diagnoses:  Midline low back pain without sciatica, unspecified chronicity     Discharge Instructions     Your xray is normal today. Please complete medrol dosepak.  Please follow up with your primary care provider to continue to manage your pain.     ED Prescriptions    Medication Sig Dispense Auth. Provider   methylPREDNISolone (MEDROL DOSEPAK) 4 MG TBPK tablet Per box instructions 21 tablet Zigmund Gottron, NP     Controlled Substance Prescriptions [place] Controlled Substance Registry consulted? Not Applicable   Zigmund Gottron, NP 04/22/18 1218

## 2018-04-22 NOTE — Telephone Encounter (Signed)
Patient's wife advised as stated below

## 2018-05-04 ENCOUNTER — Ambulatory Visit (INDEPENDENT_AMBULATORY_CARE_PROVIDER_SITE_OTHER): Payer: PPO | Admitting: Family Medicine

## 2018-05-04 ENCOUNTER — Ambulatory Visit (INDEPENDENT_AMBULATORY_CARE_PROVIDER_SITE_OTHER)
Admission: RE | Admit: 2018-05-04 | Discharge: 2018-05-04 | Disposition: A | Discharge status: Home / Self Care | Payer: PPO | Source: Ambulatory Visit | Attending: Family Medicine | Admitting: Family Medicine

## 2018-05-04 ENCOUNTER — Other Ambulatory Visit: Payer: Self-pay

## 2018-05-04 VITALS — BP 110/70 | HR 81 | Temp 97.9°F | Ht 68.5 in | Wt 209.5 lb

## 2018-05-04 DIAGNOSIS — M533 Sacrococcygeal disorders, not elsewhere classified: Secondary | ICD-10-CM

## 2018-05-04 DIAGNOSIS — E876 Hypokalemia: Secondary | ICD-10-CM | POA: Diagnosis not present

## 2018-05-04 DIAGNOSIS — R2681 Unsteadiness on feet: Secondary | ICD-10-CM | POA: Diagnosis not present

## 2018-05-04 DIAGNOSIS — M546 Pain in thoracic spine: Secondary | ICD-10-CM

## 2018-05-04 DIAGNOSIS — M545 Low back pain: Secondary | ICD-10-CM

## 2018-05-04 DIAGNOSIS — C259 Malignant neoplasm of pancreas, unspecified: Secondary | ICD-10-CM | POA: Diagnosis not present

## 2018-05-04 DIAGNOSIS — F321 Major depressive disorder, single episode, moderate: Secondary | ICD-10-CM | POA: Diagnosis not present

## 2018-05-04 DIAGNOSIS — Q8500 Neurofibromatosis, unspecified: Secondary | ICD-10-CM | POA: Diagnosis not present

## 2018-05-04 DIAGNOSIS — M961 Postlaminectomy syndrome, not elsewhere classified: Secondary | ICD-10-CM | POA: Diagnosis not present

## 2018-05-04 DIAGNOSIS — I10 Essential (primary) hypertension: Secondary | ICD-10-CM | POA: Diagnosis not present

## 2018-05-04 DIAGNOSIS — R2689 Other abnormalities of gait and mobility: Secondary | ICD-10-CM | POA: Diagnosis not present

## 2018-05-04 DIAGNOSIS — I724 Aneurysm of artery of lower extremity: Secondary | ICD-10-CM | POA: Diagnosis not present

## 2018-05-04 DIAGNOSIS — E1142 Type 2 diabetes mellitus with diabetic polyneuropathy: Secondary | ICD-10-CM | POA: Diagnosis not present

## 2018-05-04 DIAGNOSIS — M5412 Radiculopathy, cervical region: Secondary | ICD-10-CM

## 2018-05-04 DIAGNOSIS — E1165 Type 2 diabetes mellitus with hyperglycemia: Secondary | ICD-10-CM | POA: Diagnosis not present

## 2018-05-04 DIAGNOSIS — R600 Localized edema: Secondary | ICD-10-CM | POA: Diagnosis not present

## 2018-05-04 DIAGNOSIS — M50321 Other cervical disc degeneration at C4-C5 level: Secondary | ICD-10-CM | POA: Diagnosis not present

## 2018-05-04 DIAGNOSIS — M79604 Pain in right leg: Secondary | ICD-10-CM

## 2018-05-04 DIAGNOSIS — N183 Chronic kidney disease, stage 3 unspecified: Secondary | ICD-10-CM | POA: Diagnosis not present

## 2018-05-04 DIAGNOSIS — E785 Hyperlipidemia, unspecified: Secondary | ICD-10-CM | POA: Diagnosis not present

## 2018-05-04 DIAGNOSIS — M6281 Muscle weakness (generalized): Secondary | ICD-10-CM | POA: Diagnosis not present

## 2018-05-04 DIAGNOSIS — I872 Venous insufficiency (chronic) (peripheral): Secondary | ICD-10-CM | POA: Diagnosis not present

## 2018-05-04 MED ORDER — NORTRIPTYLINE HCL 25 MG PO CAPS: 25.0000 mg | ORAL_CAPSULE | Freq: Every day | ORAL | 2 refills | Status: AC

## 2018-05-04 NOTE — Progress Notes (Signed)
Medhansh Brinkmeier T. Rayshawn Visconti, MD Primary Care and [place] at [facility] [place], [address] Phone: [phone]  FAX: [phone]  Matthew Eaton - 58 y.o. male  MRN [id number]  Date of Birth: [birth year]  Visit Date: 05/04/2018  PCP: Owens Loffler, MD  Referred by: Owens Loffler, MD  Chief Complaint  Patient presents with  . Back Pain    Radiates down legs-Fell 04/19/2018-Seen at [facility] 04/22/2018  . Neck Pain  . Headache   Subjective:   Matthew Eaton is a 58 y.o. very pleasant male patient who presents with the following:  Diffuse pain complaints, head, neck, back after falling 04/19/2018. Went to [facility] and they gave him a medrol dosepak and IM Toradol.  History significant for failed neck syndrome.  Still with pain in the arm, left arm is throbbing a lot.  Nothing else new besides the fall.  Taking regular pain meds.   Center of low back.  Fairly severe pain at the top of his R sacrum.  Dropped something, slipped and hit his tail, and wrist hurting.  Tingling in arms, legs, pain in the neck are hurting a lot.  Headache.   Medrol dosepak did not help at all.  He is in a lot of pain and has limited motion.  [phone]  Past Medical History, Surgical History, Social History, Family History, Problem List, Medications, and Allergies have been reviewed and updated if relevant.  Patient Active Problem List   Diagnosis Date Noted  . Pain management 12/24/2017  . Chronic, continuous use of opioids 12/24/2017  . Diabetes mellitus type 2, uncomplicated ([place]) 58/22/0254  . 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 Chronic bilateral low back pain without sciatica 06/04/2016  . Vitamin D deficiency 10/15/2015  . Vitamin B12 deficiency 10/15/2015  . Family hx of ALS (amyotrophic lateral sclerosis) 05/27/2013  . Major depressive disorder, recurrent episode, moderate ([place]) 05/27/2013  . Obesity  (BMI 30-39.9) 05/22/2013  . Insomnia 05/20/2012  . Hiatal hernia 01/05/2011  . [place] DISEASE, CERVICAL 01/22/2010  . TOBACCO ABUSE, HX OF 01/22/2010  . Generalized anxiety disorder 03/06/2009  . Hypogonadism in male 12/19/2008  . Hyperlipidemia 11/15/2008  . GERD 11/15/2008    Past Medical History:  Diagnosis Date  . Chronic neck pain 05/20/2012  . Family hx of ALS (amyotrophic lateral sclerosis) 05/27/2013  . GERD (gastroesophageal reflux disease)   . Hiatal hernia   . HLD (hyperlipidemia)   . Major depressive disorder, recurrent episode, moderate ([place]) 05/27/2013  . Neck pain    cervical spine with herniation  . Tobacco abuse   . Wrist fracture, left     Past Surgical History:  Procedure Laterality Date  . NECK SURGERY     x2- 2010, 2011    Social History   Socioeconomic History  . Marital status: Married    Spouse name: Not on file  . Number of children: 5  . Years of education: 54  . Highest education level: Not on file  Occupational History  . Not on file  Social Needs  . Financial resource strain: Not on file  . Food insecurity:    Worry: Not on file    Inability: Not on file  . Transportation needs:    Medical: Not on file    Non-medical: Not on file  Tobacco Use  . Smoking status: Former Smoker    Years: 30.00    Last attempt to quit: 02/03/2007  Years since quitting: 11.2  . Smokeless tobacco: Never Used  Substance and Sexual Activity  . Alcohol use: No  . Drug use: No  . Sexual activity: Yes    Partners: Female  Lifestyle  . Physical activity:    Days per week: Not on file    Minutes per session: Not on file  . Stress: Not on file  Relationships  . Social connections:    Talks on phone: Not on file    Gets together: Not on file    Attends religious service: Not on file    Active member of club or organization: Not on file    Attends meetings of clubs or organizations: Not on file    Relationship status: Not on file  . Intimate partner  violence:    Fear of current or ex partner: Not on file    Emotionally abused: Not on file    Physically abused: Not on file    Forced sexual activity: Not on file  Other Topics Concern  . Not on file  Social History Narrative   Lives home with wife, Joelene Millin.  Education 12th grade.  Five children.    Family History  Problem Relation Age of Onset  . ALS Mother   . ALS Brother   . ALS Maternal Uncle   . ALS Maternal Aunt   . Colon cancer Neg Hx   . Esophageal cancer Neg Hx   . Rectal cancer Neg Hx   . Stomach cancer Neg Hx     Allergies  Allergen Reactions  . Methocarbamol Shortness Of Breath  . Gabapentin     Medication list reviewed and updated in full in [facility].  GEN: no acute illness or fever CV: No chest pain or shortness of breath MSK: detailed above Neuro: neurological signs are described above ROS O/w per HPI  Objective:   BP 110/70   Pulse 81   Temp 97.9 F (36.6 C) (Oral)   Ht 5' 8.5" (1.74 m)   Wt 209 lb 8 oz (95 kg)   SpO2 96%   BMI 31.39 kg/m    GEN: WDWN, NAD, Non-toxic, Alert & Oriented x 3 HEENT: Atraumatic, Normocephalic.  Ears and Nose: No external deformity. EXTR: No clubbing/cyanosis/edema NEURO: Normal gait.  Poor ability to stand from a chair. PSYCH: Normally interactive. Conversant. Not depressed or anxious appearing.  Calm demeanor.    Patient has approximate 65% loss of motion in all directions and particularly greater than this with rotational movements.  He has significant posterior cervical muscular tenderness.  He has some pain with movement.  He has some subjective tingling in his arms, but he is intact from a sensation as well as strength standpoint.  Does have some tenderness in the thoracic spine as well both midline as well as on the musculature adjacent to this.  No sensory deficit.  Lumbar spine is where there is the greatest tenderness, in the midline, and he is markedly tender in the upper sacral region on the  right.  He is grossly neurovascularly intact in the lower extremities.  Strength is intact.  Pulses are quite good.  He is able to walk on his tiptoes as well as his heels.  Strength in all directions is 4+ to 5/5.  Radiology: Dg Cervical Spine Complete  Result Date: 05/04/2018 CLINICAL DATA:  58 year old male with fall. Tingling sensation in the extremities. EXAM: CERVICAL SPINE - COMPLETE 4+ VIEW COMPARISON:  None. FINDINGS: There is no acute fracture  or subluxation of the cervical spine. The visualized posterior elements and odontoid appear intact. C6-C7 disc spacer and ACDF noted. There is multilevel degenerative changes primarily at C4-C6. There is anatomic alignment of the lateral masses of C1 and C2. The soft tissues appear unremarkable. IMPRESSION: 1. No acute/traumatic cervical spine pathology. 2. Multilevel degenerative changes and lower cervical ACDF. Electronically Signed   By: Anner Crete M.D.   On: 05/04/2018 20:20   Dg Thoracic Spine W/swimmers  Result Date: 05/04/2018 CLINICAL DATA:  58 year old male with fall and back pain. EXAM: THORACIC SPINE - 3 VIEWS COMPARISON:  Lumbar spine radiograph dated 04/22/2018 and chest radiograph dated 10/30/2010 FINDINGS: There is no acute fracture or subluxation of the thoracic spine. Mild degenerative changes. Lower cervical ACDF noted. Prominent density over the lower mediastinum, likely related to tortuous aorta or possible hiatal hernia. Aortic aneurysm is favored less likely. IMPRESSION: No acute/traumatic thoracic spine pathology. Electronically Signed   By: Anner Crete M.D.   On: 05/04/2018 20:22   Dg Lumbar Spine Complete  Result Date: 04/22/2018 CLINICAL DATA:  Pain after fall from stool EXAM: LUMBAR SPINE - COMPLETE 4+ VIEW COMPARISON:  Lumbar spine MRI 08/29/2016 FINDINGS: There is no evidence of lumbar spine fracture or traumatic malalignment. Mild lumbar levocurvature, also seen on prior MRI. No focal or advanced disc narrowing.  Mild scattered endplate spurring. Atherosclerotic calcification of the aorta IMPRESSION: No acute finding. Electronically Signed   By: Monte Fantasia M.D.   On: 04/22/2018 10:13   Dg Sacrum/coccyx  Result Date: 05/04/2018 CLINICAL DATA:  58 year old male with fall. EXAM: SACRUM AND COCCYX - 2+ VIEW COMPARISON:  Lumbar spine radiograph dated 04/22/2018 FINDINGS: There is no acute fracture or dislocation. Mild osteopenia. Chronic focal kyphosis of the mid sacrum. The soft tissues are unremarkable. IMPRESSION: No acute fracture or dislocation. Electronically Signed   By: Anner Crete M.D.   On: 05/04/2018 20:24     Assessment and Plan:   Sacral pain - Plan: DG Sacrum/Coccyx  Acute bilateral thoracic back pain - Plan: DG Thoracic Spine W/Swimmers  Lumbar pain with radiation down both legs  Cervical radiculopathy - Plan: DG Cervical Spine Complete  Failed neck syndrome - Plan: DG Cervical Spine Complete  >40 minutes spent in face to face time with patient, >50% spent in counselling or coordination of care: Extensive amounts of time were spent reviewing all of the patient's films from cervical, thoracic, lumbar spine, as well as sacrum and discussing this with the patient and his wife.  We discussed time reviewing anatomy.  We discussed his native changes in his neck and postoperative changes.  We discussed different treatment options and the risks and benefits of them.  Clinical concern for possible right-sided upper sacral fracture.  I reviewed with him that this likely will take up to a weeks to heal.  I discussed possibly getting a CT or MRI to confirm this, but at this point neither the patient nor I think that this is going to alter our plan of care all that much.  Acute on chronic worsening of neck and back pain and a failed spine syndrome patient.  This is challenging, and hopefully he will improve with time.  If he is not making any progress in 2 weeks, then I think probably benefits to  risks will give him a longer burst of some oral steroids.  Also going to give him some oral Pamelor to take at nighttime, to hopefully help with his radicular symptoms.  He  has follow-up with me in approximately 2 months for a generalized physical.  Follow-up: No follow-ups on file.  Meds ordered this encounter  Medications  . nortriptyline (PAMELOR) 25 MG capsule    Sig: Take 1 capsule (25 mg total) by mouth at bedtime.    Dispense:  30 capsule    Refill:  2   Orders Placed This Encounter  Procedures  . DG Cervical Spine Complete  . DG Thoracic Spine W/Swimmers  . DG Sacrum/Coccyx    Signed,  Maud Deed. Londynn Sonoda, MD   Outpatient Encounter Medications as of 05/04/2018  Medication Sig  . aspirin 81 MG tablet Take 81 mg by mouth daily.    Marland Kitchen atorvastatin (LIPITOR) 80 MG tablet TAKE 1 TABLET BY MOUTH DAILY AT 6 PM.  . buPROPion (WELLBUTRIN XL) 150 MG 24 hr tablet TAKE 1 TABLET BY MOUTH DAILY.  . cholecalciferol (VITAMIN D) 1000 units tablet Take 2,000 Units by mouth daily.  . citalopram (CELEXA) 40 MG tablet TAKE 1 TABLET BY MOUTH DAILY.  . clonazePAM (KLONOPIN) 0.5 MG tablet TAKE 1 TABLET BY MOUTH TWICE A DAY AS NEEDED  . HYDROcodone-acetaminophen (NORCO) 10-325 MG tablet Take 1 tablet by mouth every 6 (six) hours as needed for severe pain.  Marland Kitchen HYDROcodone-acetaminophen (NORCO) 10-325 MG tablet TAKE 1 TABLET BY MOUTH EVERY 6 HOURS AS NEEDED FOR SEVERE PAIN  . HYDROcodone-acetaminophen (NORCO) 10-325 MG tablet Take 1 tablet by mouth every 6 (six) hours as needed for severe pain.  Marland Kitchen omeprazole (PRILOSEC) 20 MG capsule TAKE 1 CAPSULE BY MOUTH 2 TIMES DAILY BEFORE A MEAL.  Marland Kitchen Omeprazole Magnesium 20.6 (20 BASE) MG CPDR Take 1-2 tablets by mouth daily.  . nortriptyline (PAMELOR) 25 MG capsule Take 1 capsule (25 mg total) by mouth at bedtime.  . [DISCONTINUED] methylPREDNISolone (MEDROL DOSEPAK) 4 MG TBPK tablet Per box instructions   No facility-administered encounter medications on  file as of 05/04/2018.

## 2018-05-04 NOTE — Patient Instructions (Signed)
We will call you tomorrow regardless

## 2018-05-05 ENCOUNTER — Telehealth: Payer: Self-pay | Admitting: Family Medicine

## 2018-05-05 ENCOUNTER — Encounter: Payer: Self-pay | Admitting: Family Medicine

## 2018-05-05 NOTE — Telephone Encounter (Signed)
Can you call his wife's number - it is listed in my office note.  His is not working.

## 2018-05-05 NOTE — Telephone Encounter (Signed)
X-ray results discussed with Mrs. Hardin Negus.  See result note.

## 2018-05-20 ENCOUNTER — Other Ambulatory Visit: Payer: Self-pay | Admitting: Family Medicine

## 2018-06-17 ENCOUNTER — Other Ambulatory Visit: Payer: Self-pay | Admitting: Family Medicine

## 2018-06-29 ENCOUNTER — Other Ambulatory Visit: Payer: Self-pay | Admitting: Family Medicine

## 2018-06-29 NOTE — Telephone Encounter (Signed)
Last office visit 05/04/2018 for Sacral Pain.  Welcome to [facility] appointment scheduled for 07/04/2018.  Last refilled 03/31/2018 for #[id number] with no refills x 3 months.  UDS/Contract 12/24/2017.  Ok to refill for 1 month since appointment is scheduled for next Monday?

## 2018-07-01 ENCOUNTER — Other Ambulatory Visit (INDEPENDENT_AMBULATORY_CARE_PROVIDER_SITE_OTHER): Payer: PPO

## 2018-07-01 ENCOUNTER — Other Ambulatory Visit: Payer: Self-pay

## 2018-07-01 DIAGNOSIS — E785 Hyperlipidemia, unspecified: Secondary | ICD-10-CM

## 2018-07-01 DIAGNOSIS — E119 Type 2 diabetes mellitus without complications: Secondary | ICD-10-CM

## 2018-07-01 DIAGNOSIS — Z125 Encounter for screening for malignant neoplasm of prostate: Secondary | ICD-10-CM

## 2018-07-01 DIAGNOSIS — Z1159 Encounter for screening for other viral diseases: Secondary | ICD-10-CM

## 2018-07-01 DIAGNOSIS — Z79899 Other long term (current) drug therapy: Secondary | ICD-10-CM | POA: Diagnosis not present

## 2018-07-01 DIAGNOSIS — E538 Deficiency of other specified B group vitamins: Secondary | ICD-10-CM

## 2018-07-02 LAB — BASIC METABOLIC PANEL WITH GFR
BUN: 17 mg/dL (ref 7–25)
CO2: 27 mmol/L (ref 20–32)
Calcium: 9.6 mg/dL (ref 8.6–10.3)
Chloride: 106 mmol/L (ref 98–110)
Creat: 1.1 mg/dL (ref 0.70–1.33)
GFR, Est African American: 86 mL/min/{1.73_m2} (ref >=60)
GFR, Est Non African American: 74 mL/min/{1.73_m2} (ref >=60)
Glucose, Bld: 94 mg/dL (ref 65–99)
Potassium: 4.2 mmol/L (ref 3.5–5.3)
Sodium: 143 mmol/L (ref 135–146)

## 2018-07-04 ENCOUNTER — Encounter: Payer: Self-pay | Admitting: Family Medicine

## 2018-07-04 ENCOUNTER — Ambulatory Visit (INDEPENDENT_AMBULATORY_CARE_PROVIDER_SITE_OTHER): Payer: PPO | Admitting: Family Medicine

## 2018-07-04 ENCOUNTER — Other Ambulatory Visit: Payer: Self-pay

## 2018-07-04 VITALS — BP 112/84 | HR 104 | Temp 98.4°F | Ht 68.5 in | Wt 219.8 lb

## 2018-07-04 DIAGNOSIS — E785 Hyperlipidemia, unspecified: Secondary | ICD-10-CM

## 2018-07-04 DIAGNOSIS — F411 Generalized anxiety disorder: Secondary | ICD-10-CM | POA: Diagnosis not present

## 2018-07-04 DIAGNOSIS — M961 Postlaminectomy syndrome, not elsewhere classified: Secondary | ICD-10-CM

## 2018-07-04 DIAGNOSIS — F331 Major depressive disorder, recurrent, moderate: Secondary | ICD-10-CM | POA: Diagnosis not present

## 2018-07-04 DIAGNOSIS — Z0000 Encounter for general adult medical examination without abnormal findings: Secondary | ICD-10-CM

## 2018-07-04 DIAGNOSIS — E119 Type 2 diabetes mellitus without complications: Secondary | ICD-10-CM | POA: Diagnosis not present

## 2018-07-04 DIAGNOSIS — G8929 Other chronic pain: Secondary | ICD-10-CM | POA: Diagnosis not present

## 2018-07-04 DIAGNOSIS — M545 Low back pain: Secondary | ICD-10-CM | POA: Diagnosis not present

## 2018-07-04 DIAGNOSIS — F1190 Opioid use, unspecified, uncomplicated: Secondary | ICD-10-CM | POA: Diagnosis not present

## 2018-07-04 LAB — HEMOGLOBIN A1C
Hgb A1c MFr Bld: 6.2 % of total Hgb — ABNORMAL HIGH (ref <5.7)
Mean Plasma Glucose: 131 (calc)
eAG (mmol/L): 7.3 (calc)

## 2018-07-04 LAB — CBC WITH DIFFERENTIAL/PLATELET
Absolute Monocytes: 990 cells/uL — ABNORMAL HIGH (ref 200–950)
Basophils Absolute: 81 cells/uL (ref 0–200)
Basophils Relative: 0.9 %
Eosinophils Absolute: 279 cells/uL (ref 15–500)
Eosinophils Relative: 3.1 %
HCT: 41.7 % (ref 38.5–50.0)
Hemoglobin: 13.8 g/dL (ref 13.2–17.1)
Lymphs Abs: 3204 cells/uL (ref 850–3900)
MCH: 29.4 pg (ref 27.0–33.0)
MCHC: 33.1 g/dL (ref 32.0–36.0)
MCV: 88.7 fL (ref 80.0–100.0)
MPV: 11.1 fL (ref 7.5–12.5)
Monocytes Relative: 11 %
Neutro Abs: 4446 cells/uL (ref 1500–7800)
Neutrophils Relative %: 49.4 %
Platelets: 303 10*3/uL (ref 140–400)
RBC: 4.7 10*6/uL (ref 4.20–5.80)
RDW: 13.9 % (ref 11.0–15.0)
Total Lymphocyte: 35.6 %
WBC: 9 10*3/uL (ref 3.8–10.8)

## 2018-07-04 LAB — LIPID PANEL
Cholesterol: 195 mg/dL (ref <200)
HDL: 47 mg/dL (ref >=40)
LDL Cholesterol (Calc): 102 mg/dL (calc) — ABNORMAL HIGH
Non-HDL Cholesterol (Calc): 148 mg/dL (calc) — ABNORMAL HIGH (ref <130)
Total CHOL/HDL Ratio: 4.1 (calc) (ref <5.0)
Triglycerides: 347 mg/dL — ABNORMAL HIGH (ref <150)

## 2018-07-04 LAB — MICROALBUMIN / CREATININE URINE RATIO
Creatinine,U: 242.5 mg/dL
Microalb Creat Ratio: 1.4 mg/g (ref 0.0–30.0)
Microalb, Ur: 3.3 mg/dL — ABNORMAL HIGH (ref 0.0–1.9)

## 2018-07-04 LAB — PSA, TOTAL AND FREE
PSA, % Free: 40 % (calc) (ref >25)
PSA, Free: 0.2 ng/mL
PSA, Total: 0.5 ng/mL (ref <=4.0)

## 2018-07-04 LAB — HEPATIC FUNCTION PANEL
AG Ratio: 1.7 (calc) (ref 1.0–2.5)
ALT: 27 U/L (ref 9–46)
AST: 19 U/L (ref 10–35)
Albumin: 4.1 g/dL (ref 3.6–5.1)
Alkaline phosphatase (APISO): 97 U/L (ref 35–144)
Bilirubin, Direct: 0.1 mg/dL (ref 0.0–0.2)
Globulin: 2.4 g/dL (calc) (ref 1.9–3.7)
Indirect Bilirubin: 0.2 mg/dL (calc) (ref 0.2–1.2)
Total Bilirubin: 0.3 mg/dL (ref 0.2–1.2)
Total Protein: 6.5 g/dL (ref 6.1–8.1)

## 2018-07-04 LAB — HEPATITIS C ANTIBODY
Hepatitis C Ab: NONREACTIVE
SIGNAL TO CUT-OFF: 0.02 (ref <1.00)

## 2018-07-04 LAB — VITAMIN B12: Vitamin B-12: >2000 pg/mL — ABNORMAL HIGH (ref 200–1100)

## 2018-07-04 MED ORDER — HYDROCODONE-ACETAMINOPHEN 10-325 MG PO TABS: ORAL_TABLET | ORAL | 0 refills | Status: DC

## 2018-07-04 MED ORDER — BUPROPION HCL ER (XL) 300 MG PO TB24: 300.0000 mg | ORAL_TABLET | Freq: Every day | ORAL | 5 refills | Status: DC

## 2018-07-04 MED ORDER — HYDROCODONE-ACETAMINOPHEN 10-325 MG PO TABS: 1.0000 | ORAL_TABLET | Freq: Four times a day (QID) | ORAL | 0 refills | Status: DC | PRN

## 2018-07-04 NOTE — Progress Notes (Addendum)
Kischa Altice T. Quency Tober, MD Primary Care and [place] at [facility] [place], [address] Phone: [phone]  FAX: [phone]  Matthew Eaton - 58 y.o. male  MRN [id number]  Date of Birth: [birth year]  Visit Date: 07/04/2018  PCP: Owens Loffler, MD  Referred by: Owens Loffler, MD  Chief Complaint  Patient presents with  . Welcome to Medicare   Patient Care Team: Owens Loffler, MD as PCP - General   Subjective:   Matthew Eaton is a 58 y.o. pleasant patient who presents for a welcome to medicare evaluation and complete physical examination.  He also brings up for discussion some significant ongoing depression and his ongoing pain problems:  Preventative Health Maintenance Visit:  Health Maintenance Summary Reviewed and updated, unless pt declines services.  Tobacco History Reviewed. Alcohol: No concerns, no excessive use Exercise Habits: Some activity, rec at least 30 mins 5 times a week STD concerns: no risk or activity to increase risk Drug Use: None Encouraged self-testicular check  Depression Staying in most of the time.  Feels better when by himself.  Crowds bother him.  [place]ene Plan is short. More irritable.  ? [place] Wife thinks more depressed He already is taking Celexa at 40 mg, Wellbutrin XL at 150 mg, Klonopin as needed, and nortriptyline at 25 mg. He does tell me that he feels like he is frankly depressed, but he is not suicidal or homicidal. His wife is accompanying him today, and she is notably concerned about his depression. He has been significantly worse in the past, but this is been ongoing chronic thing, he also thinks he COVID-19 is making it harder situation.  Diabetes Mellitus: diet controlled Compliance with diet: fair, Body mass index is 32.93 kg/m. Exercise: minimal / intermittent Avg blood sugars at home: not checking Foot problems: none Hypoglycemia: none No nausea,  vomitting, blurred vision, polyuria.  Lab Results  Component Value Date   HGBA1C 6.2 (H) 07/01/2018   HGBA1C 6.3 (H) 06/11/2017   HGBA1C 6.7 (H) 06/03/2016   Lab Results  Component Value Date   MICROALBUR 3.0 06/11/2017   LDLCALC 102 (H) 07/01/2018   CREATININE 1.10 07/01/2018    Wt Readings from Last 3 Encounters:  07/04/18 219 lb 12 oz (99.7 kg)  05/04/18 209 lb 8 oz (95 kg)  12/24/17 221 lb 4 oz (100.4 kg)    Lipids: Doing well, stable. Tolerating meds fine with no SE. Panel reviewed with patient.  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   Indication for chronic opioid: failed neck syndrome Medication and dose: norco 10 # pills per month: 120 Last UDS date: 12/2017 Opioid Treatment Agreement signed (Y/N):  Yes Opioid Treatment Agreement last reviewed with patient:  07/04/2018 [facility] reviewed this encounter (include red flags):   07/04/2018  8:20 AM EDT   Health Maintenance  Topic  Date Due  . Hepatitis C Screening  1960-10-21  . PNEUMOCOCCAL POLYSACCHARIDE VACCINE AGE 73-64 HIGH RISK  10/30/1962  . OPHTHALMOLOGY EXAM  10/30/1970  . URINE MICROALBUMIN  06/12/2018  . HIV Screening  12/25/2018 (Originally 10/30/1975)  . INFLUENZA VACCINE  09/03/2018  . FOOT EXAM  12/25/2018  . HEMOGLOBIN A1C  01/01/2019  . COLONOSCOPY  04/01/2021  . TETANUS/TDAP  03/05/2025    Immunization History  Administered Date(s) Administered  . Influenza,inj,Quad PF,6+ Mos 01/08/2014, 03/06/2015, 10/14/2015,  11/11/2016, 12/24/2017  . Tdap 03/06/2015    Patient Active Problem List   Diagnosis Date Noted  . Chronic, continuous use of opioids 12/24/2017  . Diabetes mellitus type 2, uncomplicated ([place]) 84/69/6295  . 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 Chronic bilateral low back pain without sciatica 06/04/2016  . Vitamin B12 deficiency 10/15/2015  . Family hx of ALS (amyotrophic lateral sclerosis) 05/27/2013  . Major depressive disorder, recurrent episode, moderate ([place]) 05/27/2013  . Obesity (BMI 30-39.9) 05/22/2013  . Insomnia 05/20/2012  . Hiatal hernia 01/05/2011  . [place] DISEASE, CERVICAL 01/22/2010  . TOBACCO ABUSE, HX OF 01/22/2010  . Generalized anxiety disorder 03/06/2009  . Hypogonadism in male 12/19/2008  . Hyperlipidemia 11/15/2008  . GERD 11/15/2008   Past Medical History:  Diagnosis Date  . Chronic neck pain 05/20/2012  . Family hx of ALS (amyotrophic lateral sclerosis) 05/27/2013  . GERD (gastroesophageal reflux disease)   . Hiatal hernia   . HLD (hyperlipidemia)   . Major depressive disorder, recurrent episode, moderate ([place]) 05/27/2013  . Neck pain    cervical spine with herniation  . Tobacco abuse   . Wrist fracture, left    Past Surgical History:  Procedure Laterality Date  . NECK SURGERY     x2- 2010, 2011   Social History   Socioeconomic History  . Marital status: Married    Spouse name: Not on file  . Number of children: 5  . Years of education: 32  . Highest education level: Not on file  Occupational History  . Not on file  Social Needs  . Financial resource strain: Not on file  . Food insecurity:    Worry: Not on file    Inability: Not on file  . Transportation needs:    Medical: Not on file    Non-medical: Not on file  Tobacco Use  . Smoking status: Former Smoker    Years: 30.00    Last attempt to quit: 02/03/2007    Years since quitting: 11.4  . Smokeless tobacco: Never Used  Substance and Sexual Activity  . Alcohol use: No  . Drug use: No   . Sexual activity: Yes    Partners: Female  Lifestyle  . Physical activity:    Days per week: Not on file    Minutes per session: Not on file  . Stress: Not on file  Relationships  . Social connections:    Talks on phone: Not on file    Gets together: Not on file    Attends religious service: Not on file    Active member of club or organization: Not on file    Attends meetings of clubs or organizations: Not on file    Relationship status: Not on file  . Intimate partner violence:    Fear of current or ex partner: Not on file    Emotionally abused: Not on file    Physically abused: Not on file    Forced sexual activity: Not on file  Other Topics  Concern  . Not on file  Social History Narrative   Lives home with wife, Matthew Eaton.  Education 12th grade.  Five children.   Family History  Problem Relation Age of Onset  . ALS Mother   . ALS Brother   . ALS Maternal Uncle   . ALS Maternal Aunt   . Colon cancer Neg Hx   . Esophageal cancer Neg Hx   . Rectal cancer Neg Hx   . Stomach cancer Neg Hx    Allergies  Allergen Reactions  . Methocarbamol Shortness Of Breath  . Gabapentin     Medication list has been reviewed and updated.   General: Denies fever, chills, sweats. No significant weight loss. Eyes: Denies blurring,significant itching ENT: Denies earache, sore throat, and hoarseness. Cardiovascular: Denies chest pains, palpitations, dyspnea on exertion Respiratory: Denies cough, dyspnea at rest,wheeezing Breast: no concerns about lumps GI: Denies nausea, vomiting, diarrhea, constipation, change in bowel habits, abdominal pain, melena, hematochezia GU: Denies penile discharge, ED, urinary flow / outflow problems. No STD concerns. Musculoskeletal:as above Derm: Denies rash, itching Neuro: Denies  paresthesias, frequent falls, frequent headaches Psych: as above Endocrine: Denies cold intolerance, heat intolerance, polydipsia Heme: Denies enlarged lymph nodes  Allergy: No hayfever  Objective:   BP 112/84   Pulse (!) 104   Temp 98.4 F (36.9 C) (Oral)   Ht 5' 8.5" (1.74 m)   Wt 219 lb 12 oz (99.7 kg)   BMI 32.93 kg/m  Fall Risk  07/04/2018  Falls in the past year? 1  Number falls in past yr: 1  Injury with Fall? 1   Ideal Body Weight: Weight in (lb) to have BMI = 25: 166.5  Hearing Screening   Method: Audiometry   125Hz  250Hz  500Hz  1000Hz  2000Hz  3000Hz  4000Hz  6000Hz  8000Hz   Right ear:   20 20 20  20     Left ear:   20 20 20   0      Visual Acuity Screening   Right eye Left eye Both eyes  Without correction: 20/50 20/200 20/25  With correction:      Depression screen PHQ 2/9 07/04/2018  Decreased Interest 1  Down, Depressed, Hopeless 3  PHQ - 2 Score 4  Altered sleeping 3  Tired, decreased energy 3  Change in appetite 3  Feeling bad or failure about yourself  3  Trouble concentrating 0  Moving slowly or fidgety/restless 3  Suicidal thoughts 0  PHQ-9 Score 19     GEN: well developed, well nourished, no acute distress Eyes: conjunctiva and lids normal, PERRLA, EOMI ENT: TM clear, nares clear, oral exam WNL Neck: supple, no lymphadenopathy, no thyromegaly, no JVD Pulm: clear to auscultation and percussion, respiratory effort normal CV: regular rate and rhythm, S1-S2, no murmur, rub or gallop, no bruits, peripheral pulses normal and symmetric, no cyanosis, clubbing, edema or varicosities GI: soft, non-tender; no hepatosplenomegaly, masses; active bowel sounds all quadrants GU: no hernia, testicular mass, penile discharge Lymph: no cervical, axillary or inguinal adenopathy MSK: gait normal, muscle tone and strength WNL, no joint swelling, effusions, discoloration, crepitus  SKIN: clear, good turgor, color WNL, no rashes, lesions, or ulcerations Neuro: normal mental status, normal strength, sensation, and motion Psych: alert; oriented to person, place and time, normally interactive and not anxious or depressed in appearance.   All labs reviewed with patient. Results for orders placed or performed in visit on 07/01/18  Lipid panel  Result Value Ref Range   Cholesterol 195 <200 mg/dL   HDL 47 >  OR = 40 mg/dL   Triglycerides 347 (H) <150 mg/dL   LDL Cholesterol (Calc) 102 (H) mg/dL (calc)   Total CHOL/HDL Ratio 4.1 <5.0 (calc)   Non-HDL Cholesterol (Calc) 148 (H) <130 mg/dL (calc)  Hemoglobin A1c  Result Value Ref Range   Hgb A1c MFr Bld 6.2 (H) <5.7 % of total Hgb   Mean Plasma Glucose 131 (calc)   eAG (mmol/L) 7.3 (calc)  CBC with Differential/Platelet  Result Value Ref Range   WBC 9.0 3.8 - 10.8 Thousand/uL   RBC 4.70 4.20 - 5.80 Million/uL   Hemoglobin 13.8 13.2 - 17.1 g/dL   HCT 41.7 38.5 - 50.0 %   MCV 88.7 80.0 - 100.0 fL   MCH 29.4 27.0 - 33.0 pg   MCHC 33.1 32.0 - 36.0 g/dL   RDW 13.9 11.0 - 15.0 %   Platelets 303 140 - 400 Thousand/uL   MPV 11.1 7.5 - 12.5 fL   Neutro Abs 4,446 1,500 - 7,800 cells/uL   Lymphs Abs 3,204 850 - 3,900 cells/uL   Absolute Monocytes 990 (H) 200 - 950 cells/uL   Eosinophils Absolute 279 15 - 500 cells/uL   Basophils Absolute 81 0 - 200 cells/uL   Neutrophils Relative % 49.4 %   Total Lymphocyte 35.6 %   Monocytes Relative 11.0 %   Eosinophils Relative 3.1 %   Basophils Relative 0.9 %  Vitamin B12  Result Value Ref Range   Vitamin B-12 >2,000 (H) 200 - 1,100 pg/mL  Hepatic function panel  Result Value Ref Range   Total Protein 6.5 6.1 - 8.1 g/dL   Albumin 4.1 3.6 - 5.1 g/dL   Globulin 2.4 1.9 - 3.7 g/dL (calc)   AG Ratio 1.7 1.0 - 2.5 (calc)   Total Bilirubin 0.3 0.2 - 1.2 mg/dL   Bilirubin, Direct 0.1 0.0 - 0.2 mg/dL   Indirect Bilirubin 0.2 0.2 - 1.2 mg/dL (calc)   Alkaline phosphatase (APISO) 97 35 - 144 U/L   AST 19 10 - 35 U/L   ALT 27 9 - 46 U/L  BASIC METABOLIC PANEL WITH GFR  Result Value Ref Range   Glucose, Bld 94 65 - 99 mg/dL   BUN 17 7 - 25 mg/dL   Creat 1.10 0.70 - 1.33 mg/dL   GFR, Est Non African American 74 > OR = 60 mL/min/1.34m2    GFR, Est African American 86 > OR = 60 mL/min/1.62m2   BUN/Creatinine Ratio NOT APPLICABLE 6 - 22 (calc)   Sodium 143 135 - 146 mmol/L   Potassium 4.2 3.5 - 5.3 mmol/L   Chloride 106 98 - 110 mmol/L   CO2 27 20 - 32 mmol/L   Calcium 9.6 8.6 - 10.3 mg/dL    Assessment and Plan:   Healthcare maintenance  Type 2 diabetes mellitus without complication, without long-term current use of insulin (HCC) - Plan: Microalbumin / creatinine urine ratio  Hyperlipidemia, unspecified hyperlipidemia type - Plan: Microalbumin / creatinine urine ratio  Major depressive disorder, recurrent episode, moderate (HCC): >10 minutes spent in face to face time with patient ourside of medical wellness exam and general physical exam, >50% spent in counselling or coordination of care: He is globally not doing well from a predominant depression and to some degree anxiety standpoint.  See my notes above.  We spent significant amount of time just talking about this as well as management.  Things reasonable to increase his Wellbutrin to 300 mg, but he is already on 4 different psychoactive medications.  If this does not significantly alleviate his symptoms, then I am going to involve psychiatry, and he understands this.  Failed neck syndrome: Refilled medication, think this is a challenging situation, and he is interested in possibly going off his chronic opioids.  I think this is reasonable to try, and I recommended that if he does this to do this very slowly and cutting down by about half a tablet a week in total with a slow titration off.  Chronic, continuous use of opioids  Chronic bilateral low back pain without sciatica  Generalized anxiety disorder  Health Maintenance Exam: The patient's preventative maintenance and recommended screening tests for an annual wellness exam were reviewed in full today. Brought up to date unless services declined.  Counselled on the importance of diet, exercise, and its role in  overall health and mortality. The patient's FH and SH was reviewed, including their home life, tobacco status, and drug and alcohol status.  Follow-up in 1 year for physical exam or additional follow-up below.  I have personally reviewed the Medicare Annual Wellness questionnaire and have noted 1. The patient's medical and social history 2. Their use of alcohol, tobacco or illicit drugs 3. Their current medications and supplements 4. The patient's functional ability including ADL's, fall risks, home safety risks and hearing or visual             impairment. 5. Diet and physical activities 6. Evidence for depression or mood disorders 7. Reviewed Updated provider list, see scanned forms and [facility] Snapshot.  8.  Advanced directives were reviewed with the patient face to face, and he has neither a HCPOA or a living  will, and for now he will think on this.  The patients weight, height, BMI and visual acuity have been recorded in the chart I have made referrals, counseling and provided education to the patient based review of the above and I have provided the pt with a written personalized care plan for preventive services.  I have provided the patient with a copy of your personalized plan for preventive services. Instructed to take the time to review along with their updated medication list.  Follow-up: Return in about 3 months (around 10/04/2018) for pain and depression follow-up. Or follow-up in 1 year if not noted.  Future Appointments  Date Time Provider [place]  10/03/2018  8:40 AM [place], Frederico Hamman, MD [facility]    Meds ordered this encounter  Medications  . HYDROcodone-acetaminophen (NORCO) 10-325 MG tablet    Sig: Take 1 tablet by mouth every 6 (six) hours as needed for severe pain.    Dispense:  120 tablet    Refill:  0    DO NOT FILL UNTIL 08/29/2018  . HYDROcodone-acetaminophen (NORCO) 10-325 MG tablet    Sig: TAKE 1 TABLET BY MOUTH EVERY 6 HOURS AS NEEDED FOR  SEVERE PAIN    Dispense:  120 tablet    Refill:  0    DO NOT FILL UNTIL 07/30/2018  . buPROPion (WELLBUTRIN XL) 300 MG 24 hr tablet    Sig: Take 1 tablet (300 mg total) by mouth daily.    Dispense:  30 tablet    Refill:  5   Medications Discontinued During This Encounter  Medication Reason  . Omeprazole Magnesium 20.6 (20 BASE) MG CPDR Duplicate  . HYDROcodone-acetaminophen (NORCO) 10-325 MG tablet Reorder  . HYDROcodone-acetaminophen (NORCO) 10-325 MG tablet Reorder  . buPROPion (WELLBUTRIN XL) 150 MG 24 hr tablet    No orders of the defined  types were placed in this encounter.   Signed,  Maud Deed. Jailee Jaquez, MD   Allergies as of 07/04/2018      Reactions   Methocarbamol Shortness Of Breath   Gabapentin       Medication List       Accurate as of July 04, 2018 11:12 AM. If you have any questions, ask your nurse or doctor.        aspirin 81 MG tablet Take 81 mg by mouth daily.   atorvastatin 80 MG tablet Commonly known as:  LIPITOR TAKE 1 TABLET BY MOUTH DAILY AT 6 PM.   buPROPion 300 MG 24 hr tablet Commonly known as:  WELLBUTRIN XL Take 1 tablet (300 mg total) by mouth daily. What changed:    medication strength  how much to take Changed by:  Owens Loffler, MD   cholecalciferol 1000 units tablet Commonly known as:  VITAMIN D Take 2,000 Units by mouth daily.   citalopram 40 MG tablet Commonly known as:  CELEXA TAKE 1 TABLET BY MOUTH DAILY.   clonazePAM 0.5 MG tablet Commonly known as:  KLONOPIN TAKE 1 TABLET BY MOUTH TWICE A DAY AS NEEDED   HYDROcodone-acetaminophen 10-325 MG tablet Commonly known as:  NORCO TAKE 1 TABLET BY MOUTH EVERY 6 HOURS AS NEEDED FOR SEVERE PAIN. DO NOT FILL UNTIL 05/30/2018   HYDROcodone-acetaminophen 10-325 MG tablet Commonly known as:  NORCO Take 1 tablet by mouth every 6 (six) hours as needed for severe pain.   HYDROcodone-acetaminophen 10-325 MG tablet Commonly known as:  NORCO TAKE 1 TABLET BY MOUTH EVERY 6 HOURS  AS NEEDED FOR SEVERE PAIN   nortriptyline 25 MG capsule Commonly known as:  PAMELOR Take 1 capsule (25 mg total) by mouth at bedtime.   omeprazole 20 MG capsule Commonly known as:  PRILOSEC TAKE 1 CAPSULE BY MOUTH 2 TIMES DAILY BEFORE A MEAL.

## 2018-07-11 ENCOUNTER — Other Ambulatory Visit: Payer: Self-pay | Admitting: Family Medicine

## 2018-07-11 ENCOUNTER — Telehealth: Payer: Self-pay | Admitting: Family Medicine

## 2018-07-11 NOTE — Telephone Encounter (Signed)
[name], I can't figure out how to do letter. Can you help  Use juror number  Matthew Eaton. Matthew Eaton, DOB March 03, 1960, is a very well-known patient who is chronically disabled with social security disability from severe, recalcitrant neck and back pain with a history of failed neck syndrome. (Failure of neck surgery and chronic pain afterwards.)  In my opinion, there is no way that he could sit for a prolonged time for jury duty.

## 2018-07-11 NOTE — Telephone Encounter (Signed)
done

## 2018-07-11 NOTE — Telephone Encounter (Signed)
Kimberly notified letter is ready to be picked up at the front desk.

## 2018-07-11 NOTE — Progress Notes (Signed)
error 

## 2018-07-11 NOTE — Telephone Encounter (Signed)
[place] letter written.  Needs Dr. Lorelei Pont signature.

## 2018-07-11 NOTE — Telephone Encounter (Signed)
Pt wife, Joelene Millin, called to request a letter of disability be written for pt due to neck, shoulder and hip pain. Juror # [id number]. Please call when ready pick up.

## 2018-07-19 ENCOUNTER — Other Ambulatory Visit: Payer: Self-pay | Admitting: Family Medicine

## 2018-07-25 ENCOUNTER — Other Ambulatory Visit: Payer: Self-pay | Admitting: Family Medicine

## 2018-07-26 NOTE — Telephone Encounter (Signed)
Last office visit 07/04/2018 for CPE.  Last refilled 01/14/2018 for #60 with 3 refills.  Next Appt: 10/03/2018 for depression and pain management.

## 2018-08-13 ENCOUNTER — Other Ambulatory Visit: Payer: Self-pay | Admitting: Family Medicine

## 2018-09-05 ENCOUNTER — Encounter: Payer: Self-pay | Admitting: Physician Assistant

## 2018-09-05 ENCOUNTER — Ambulatory Visit
Admission: EM | Admit: 2018-09-05 | Discharge: 2018-09-05 | Disposition: A | Discharge status: Home / Self Care | Payer: PPO | Attending: Physician Assistant | Admitting: Physician Assistant

## 2018-09-05 ENCOUNTER — Other Ambulatory Visit: Payer: Self-pay

## 2018-09-05 DIAGNOSIS — M545 Low back pain, unspecified: Secondary | ICD-10-CM

## 2018-09-05 MED ORDER — DEXAMETHASONE SODIUM PHOSPHATE 10 MG/ML IJ SOLN
10.0000 mg | Freq: Once | INTRAMUSCULAR | Status: AC
  Administered 2018-09-05: 10 mg via INTRAMUSCULAR

## 2018-09-05 MED ORDER — METHYLPREDNISOLONE 4 MG PO TBPK: ORAL_TABLET | ORAL | 0 refills | Status: DC

## 2018-09-05 NOTE — Discharge Instructions (Signed)
Decadron injection in office today. Medrol dose pack as directed. This can take up to 3-4 weeks to completely resolve, but you should be feeling better each week. Follow up with PCP if symptoms worsen, changes for reevaluation. If experience numbness/tingling of the inner thighs, loss of bladder or bowel control, go to the emergency department for evaluation.

## 2018-09-05 NOTE — ED Provider Notes (Signed)
[facility]    CSN: [id number] Arrival date & time: 09/05/18  1011     History   Chief Complaint Chief Complaint  Patient presents with  . Back Pain    HPI Matthew Eaton is a 58 y.o. male.   58 year old male comes in for acute onset of right-sided back pain after bending over this morning.  States has had intermittent back pain since March.  He has had multiple x-rays, and states was told that he had a fracture.  He takes Norco chronically.  After current symptom onset, denies any saddle anesthesia, loss of bladder or bowel control.  Denies any hip pain.  Took regular dose of Norco, has not tried anything else.     Past Medical History:  Diagnosis Date  . Chronic neck pain 05/20/2012  . Family hx of ALS (amyotrophic lateral sclerosis) 05/27/2013  . GERD (gastroesophageal reflux disease)   . Hiatal hernia   . HLD (hyperlipidemia)   . Major depressive disorder, recurrent episode, moderate ([place]) 05/27/2013  . Neck pain    cervical spine with herniation  . Tobacco abuse   . Wrist fracture, left     Patient Active Problem List   Diagnosis Date Noted  . Chronic, continuous use of opioids 12/24/2017  . Diabetes mellitus type 2, uncomplicated ([place]) 30/16/0109  . 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 Chronic bilateral low back pain without sciatica 06/04/2016  . Vitamin B12 deficiency 10/15/2015  . Family hx of ALS (amyotrophic lateral sclerosis) 05/27/2013  . Major depressive disorder, recurrent episode, moderate ([place]) 05/27/2013  . Obesity (BMI 30-39.9) 05/22/2013  . Insomnia 05/20/2012  . Hiatal hernia 01/05/2011  . [place] DISEASE, CERVICAL 01/22/2010  . TOBACCO ABUSE, HX OF 01/22/2010  . Generalized anxiety disorder 03/06/2009  . Hypogonadism in male 12/19/2008  . Hyperlipidemia 11/15/2008  . GERD 11/15/2008    Past Surgical History:  Procedure Laterality Date  . NECK SURGERY     x2- 2010, 2011       Home Medications    Prior to Admission  medications   Medication Sig Start Date End Date Taking? Authorizing Provider  aspirin 81 MG tablet Take 81 mg by mouth daily.      [provider]  atorvastatin (LIPITOR) 80 MG tablet TAKE 1 TABLET BY MOUTH DAILY AT 6 PM. 06/17/18   Copland, Frederico Hamman, MD  buPROPion (WELLBUTRIN XL) 300 MG 24 hr tablet Take 1 tablet (300 mg total) by mouth daily. 07/04/18   Copland, Frederico Hamman, MD  cholecalciferol (VITAMIN D) 1000 units tablet Take 2,000 Units by mouth daily.    [provider]  citalopram (CELEXA) 40 MG tablet TAKE 1 TABLET BY MOUTH DAILY. 07/19/18   Copland, Frederico Hamman, MD  clonazePAM (KLONOPIN) 0.5 MG tablet TAKE 1 TABLET BY MOUTH TWICE A DAY AS NEEDED 07/26/18   Copland, Frederico Hamman, MD  HYDROcodone-acetaminophen (NORCO) 10-325 MG tablet TAKE 1 TABLET BY MOUTH EVERY 6 HOURS AS NEEDED FOR SEVERE PAIN. DO NOT FILL UNTIL 05/30/2018 06/29/18   Copland, Frederico Hamman, MD  HYDROcodone-acetaminophen (NORCO) 10-325 MG tablet Take 1 tablet by mouth every 6 (six) hours as needed for severe pain. 07/04/18   Copland, Frederico Hamman, MD  HYDROcodone-acetaminophen (NORCO) 10-325 MG tablet TAKE 1 TABLET BY MOUTH EVERY 6 HOURS AS NEEDED FOR SEVERE PAIN 07/04/18   Copland, Frederico Hamman, MD  methylPREDNISolone (MEDROL DOSEPAK) 4 MG TBPK tablet Follow box direction 09/05/18   Tasia Catchings, Amy V, PA-C  nortriptyline (PAMELOR) 25 MG capsule Take 1 capsule (25 mg total)  by mouth at bedtime. 05/04/18   Copland, Frederico Hamman, MD  omeprazole (PRILOSEC) 20 MG capsule TAKE 1 CAPSULE BY MOUTH 2 TIMES DAILY BEFORE A MEAL. 06/17/18   Owens Loffler, MD    Family History Family History  Problem Relation Age of Onset  . ALS Mother   . ALS Brother   . ALS Maternal Uncle   . ALS Maternal Aunt   . Colon cancer Neg Hx   . Esophageal cancer Neg Hx   . Rectal cancer Neg Hx   . Stomach cancer Neg Hx     Social History Social History   Tobacco Use  . Smoking status: Former Smoker    Years: 30.00    Quit date: 02/03/2007    Years since quitting: 11.5  .  Smokeless tobacco: Never Used  Substance Use Topics  . Alcohol use: No  . Drug use: No     Allergies   Methocarbamol and Gabapentin   Review of Systems Review of Systems  Reason unable to perform ROS: See HPI as above.     Physical Exam Triage Vital Signs ED Triage Vitals [09/05/18 1025]  Enc Vitals Group     BP (!) 149/96     Pulse Rate 77     Resp 17     Temp 98.1 F (36.7 C)     Temp Source Oral     SpO2 96 %     Weight      Height      Head Circumference      Peak Flow      Pain Score      Pain Loc      Pain Edu?      Excl. in [place]?    No data found.  Updated Vital Signs BP (!) 149/96 (BP Location: Left Arm)   Pulse 77   Temp 98.1 F (36.7 C) (Oral)   Resp 17   SpO2 96%   Physical Exam Constitutional:      General: He is not in acute distress.    Appearance: He is well-developed. He is not diaphoretic.  HENT:     Head: Normocephalic and atraumatic.  Eyes:     Conjunctiva/sclera: Conjunctivae normal.     Pupils: Pupils are equal, round, and reactive to light.  Cardiovascular:     Rate and Rhythm: Normal rate and regular rhythm.     Heart sounds: Normal heart sounds. No murmur. No friction rub. No gallop.   Pulmonary:     Effort: Pulmonary effort is normal. No accessory muscle usage or respiratory distress.     Breath sounds: Normal breath sounds. No stridor. No decreased breath sounds, wheezing, rhonchi or rales.  Musculoskeletal:     Comments: No tenderness on palpation of the spinous processes.  Tenderness to palpation along lumbar right paraspinal muscles.  Decreased range of motion of back.  Unable to tolerate active range of motion of hip.  Full passive range of motion of hip.  Strength deferred.  Sensation intact and equal bilaterally.  Negative straight leg raise.    Skin:    General: Skin is warm and dry.  Neurological:     Mental Status: He is alert and oriented to person, place, and time.    UC Treatments / Results  Labs (all labs  ordered are listed, but only abnormal results are displayed) Labs Reviewed - No data to display  EKG   Radiology No results found.  Procedures Procedures (including critical care time)  Medications Ordered  in UC Medications  dexamethasone (DECADRON) injection 10 mg (10 mg Intramuscular Given 09/05/18 1051)    Initial Impression / Assessment and Plan / UC Course  I have reviewed the triage vital signs and the nursing notes.  Pertinent labs & imaging results that were available during my care of the patient were reviewed by me and considered in my medical decision making (see chart for details).    Patient states Toradol injection back in March was not beneficial.  Would like to try Decadron injection.  Will provide Medrol pack for pain.  Patient to follow-up with PCP, who is also his pain management provider, for further management needed.  Return precautions given.  Final Clinical Impressions(s) / UC Diagnoses   Final diagnoses:  Acute right-sided low back pain without sciatica    ED Prescriptions    Medication Sig Dispense Auth. Provider   methylPREDNISolone (MEDROL DOSEPAK) 4 MG TBPK tablet Follow box direction 21 tablet Tobin Chad, [place] 09/05/18 1319

## 2018-09-05 NOTE — ED Triage Notes (Signed)
Pt states hx of back fx. States bent over this am and states back went out

## 2018-09-10 ENCOUNTER — Other Ambulatory Visit: Payer: Self-pay | Admitting: Family Medicine

## 2018-09-16 ENCOUNTER — Other Ambulatory Visit: Payer: Self-pay | Admitting: Family Medicine

## 2018-09-26 ENCOUNTER — Other Ambulatory Visit: Payer: Self-pay | Admitting: Family Medicine

## 2018-09-27 NOTE — Telephone Encounter (Signed)
Last office visit 07/04/2018 for CPE.  Last refilled 06/29/2018 x 3 months.  UDS/Contract 12/24/2017.  Next Appt: 10/03/2018 to follow up depression.

## 2018-10-02 NOTE — Progress Notes (Signed)
Matthew Eaton T. Harris Penton, MD [facility] and [place] at [facility] [place], [address] Phone: [phone]  FAX: [phone]  Matthew Eaton - 58 y.o. male  MRN [id number]  Date of Birth: August 27, 1960  Visit Date: 10/03/2018  PCP: Matthew Loffler, MD  Referred by: Matthew Loffler, MD  Chief Complaint  Patient presents with  . Pain Management  . Depression   Subjective:   Matthew Eaton is a 58 y.o. very pleasant male patient who presents with the following:  Matthew Eaton is here for follow-up on his anxiety and depression which is been difficult to control over the last 2 or 3 years.  He at least has only some mild symptoms and over period of time.  Anxiety klonopin BID  Chronic LBP  Indication for chronic opioid: Failed spine syndrome, chronic neck pain and back pain. Medication and dose: norco 10 # pills per month: 120 Last UDS date: 12/2017 Opioid Treatment Agreement signed (Y/N):  Yes Opioid Treatment Agreement last reviewed with patient:  10/03/2018 [facility] reviewed this encounter (include red flags):   10/03/2018  8:40 AM EDT   Past Medical History, Surgical History, Social History, Family History, Problem List, Medications, and Allergies have been reviewed and updated if relevant.  Patient Active Problem List   Diagnosis Date Noted  . Chronic, continuous use of opioids 12/24/2017  . Diabetes mellitus type 2, uncomplicated ([place]) 99991111  . 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 Chronic bilateral low back pain without sciatica 06/04/2016  . Vitamin B12 deficiency 10/15/2015  . Family hx of ALS (amyotrophic lateral sclerosis) 05/27/2013  . Major depressive disorder, recurrent episode, moderate ([place]) 05/27/2013  . Obesity (BMI 30-39.9) 05/22/2013  . Insomnia 05/20/2012  . Hiatal hernia 01/05/2011  . Matthew Eaton DISEASE, CERVICAL 01/22/2010  . TOBACCO ABUSE, HX OF 01/22/2010  . Generalized anxiety disorder  03/06/2009  . Hypogonadism in male 12/19/2008  . Hyperlipidemia 11/15/2008  . GERD 11/15/2008    Past Medical History:  Diagnosis Date  . Chronic neck pain 05/20/2012  . Family hx of ALS (amyotrophic lateral sclerosis) 05/27/2013  . GERD (gastroesophageal reflux disease)   . Hiatal hernia   . HLD (hyperlipidemia)   . Major depressive disorder, recurrent episode, moderate ([place]) 05/27/2013  . Neck pain    cervical spine with herniation  . Tobacco abuse   . Wrist fracture, left     Past Surgical History:  Procedure Laterality Date  . NECK SURGERY     x2- 2010, 2011    Social History   Socioeconomic History  . Marital status: Married    Spouse name: Not on file  . Number of children: 5  . Years of education: 18  . Highest education level: Not on file  Occupational History  . Not on file  Social Needs  . Financial resource strain: Not on file  . Food insecurity    Worry: Not on file    Inability: Not on file  . Transportation needs    Medical: Not on file    Non-medical: Not on file  Tobacco Use  . Smoking status: Former Smoker    Years: 30.00    Quit date: 02/03/2007    Years since quitting: 11.6  . Smokeless tobacco: Never Used  Substance and Sexual Activity  . Alcohol use: No  . Drug use: No  . Sexual activity: Yes    Partners: Female  Lifestyle  . Physical activity    Days  per week: Not on file    Minutes per session: Not on file  . Stress: Not on file  Relationships  . Social Herbalist on phone: Not on file    Gets together: Not on file    Attends religious service: Not on file    Active member of club or organization: Not on file    Attends meetings of clubs or organizations: Not on file    Relationship status: Not on file  . Intimate partner violence    Fear of current or ex partner: Not on file    Emotionally abused: Not on file    Physically abused: Not on file    Forced sexual activity: Not on file  Other Topics Concern  . Not on  file  Social History Narrative   Lives home with wife, Matthew Eaton.  Education 12th grade.  Five children.    Family History  Problem Relation Age of Onset  . ALS Mother   . ALS Brother   . ALS Maternal Uncle   . ALS Maternal Aunt   . Colon cancer Neg Hx   . Esophageal cancer Neg Hx   . Rectal cancer Neg Hx   . Stomach cancer Neg Hx     Allergies  Allergen Reactions  . Methocarbamol Shortness Of Breath  . Gabapentin     Medication list reviewed and updated in full in [place].   GEN: No acute illnesses, no fevers, chills. GI: No n/v/d, eating normally Pulm: No SOB Interactive and getting along well at home.  Otherwise, ROS is as per the HPI.  Objective:   BP 110/72   Pulse 96   Temp 98 F (36.7 C) (Temporal)   Ht 5' 8.5" (1.74 m)   Wt 226 lb (102.5 kg)   SpO2 94%   BMI 33.86 kg/m   GEN: WDWN, NAD, Non-toxic, A & O x 3 HEENT: Atraumatic, Normocephalic. Neck supple. No masses, No LAD. Ears and Nose: No external deformity. EXTR: No c/c/e NEURO Normal gait.  PSYCH: Normally interactive. Conversant. Not depressed or anxious appearing.  Calm demeanor.   Laboratory and Imaging Data:  Assessment and Plan:     ICD-10-CM   1. Generalized anxiety disorder  F41.1   2. Major depressive disorder, recurrent episode, moderate (HCC)  F33.1   3. Failed neck syndrome  M96.1   4. Chronic bilateral low back pain without sciatica  M54.5    G89.29   5. Chronic, continuous use of opioids  F11.90    Anxiety and depression are actually doing quite a bit better today.  Failed neck syndrome, chronic neck and back syndrome or essentially status quo.  Refill pain medications.  Follow-up: Return in about 3 months (around 01/02/2019) for pain follow-up.  Meds ordered this encounter  Medications  . HYDROcodone-acetaminophen (NORCO) 10-325 MG tablet    Sig: TAKE 1 TABLET BY MOUTH EVERY 6 HOURS AS NEEDED FOR SEVERE PAIN.    Dispense:  120 tablet    Refill:  0    DO NOT  FILL UNTIL 11/02/2018  . HYDROcodone-acetaminophen (NORCO) 10-325 MG tablet    Sig: TAKE 1 TABLET BY MOUTH EVERY 6 HOURS AS NEEDED FOR SEVERE PAIN    Dispense:  120 tablet    Refill:  0    DO NOT FILL UNTIL 12/02/2018  . HYDROcodone-acetaminophen (NORCO) 10-325 MG tablet    Sig: Take 1 tablet by mouth every 6 (six) hours as needed for severe pain.  Dispense:  120 tablet    Refill:  0   No orders of the defined types were placed in this encounter.   Signed,  Maud Deed. Deborrah Mabin, MD   Outpatient Encounter Medications as of 10/03/2018  Medication Sig  . aspirin 81 MG tablet Take 81 mg by mouth daily.    Marland Kitchen atorvastatin (LIPITOR) 80 MG tablet TAKE 1 TABLET BY MOUTH DAILY AT 6 PM.  . buPROPion (WELLBUTRIN XL) 300 MG 24 hr tablet Take 1 tablet (300 mg total) by mouth daily.  . cholecalciferol (VITAMIN D) 1000 units tablet Take 2,000 Units by mouth daily.  . citalopram (CELEXA) 40 MG tablet TAKE 1 TABLET BY MOUTH DAILY.  . clonazePAM (KLONOPIN) 0.5 MG tablet TAKE 1 TABLET BY MOUTH TWICE A DAY AS NEEDED  . methylPREDNISolone (MEDROL DOSEPAK) 4 MG TBPK tablet Follow box direction  . nortriptyline (PAMELOR) 25 MG capsule Take 1 capsule (25 mg total) by mouth at bedtime.  Marland Kitchen omeprazole (PRILOSEC) 20 MG capsule TAKE 1 CAPSULE BY MOUTH 2 TIMES DAILY BEFORE A MEAL.  . [DISCONTINUED] HYDROcodone-acetaminophen (NORCO) 10-325 MG tablet TAKE 1 TABLET BY MOUTH EVERY 6 HOURS AS NEEDED FOR SEVERE PAIN. DO NOT FILL UNTIL 05/30/2018  . [DISCONTINUED] HYDROcodone-acetaminophen (NORCO) 10-325 MG tablet TAKE 1 TABLET BY MOUTH EVERY 6 HOURS AS NEEDED FOR SEVERE PAIN  . [DISCONTINUED] HYDROcodone-acetaminophen (NORCO) 10-325 MG tablet Take 1 tablet by mouth every 6 (six) hours as needed for severe pain.  Marland Kitchen HYDROcodone-acetaminophen (NORCO) 10-325 MG tablet TAKE 1 TABLET BY MOUTH EVERY 6 HOURS AS NEEDED FOR SEVERE PAIN.  . HYDROcodone-acetaminophen (NORCO) 10-325 MG tablet TAKE 1 TABLET BY MOUTH EVERY 6 HOURS AS  NEEDED FOR SEVERE PAIN  . HYDROcodone-acetaminophen (NORCO) 10-325 MG tablet Take 1 tablet by mouth every 6 (six) hours as needed for severe pain.   No facility-administered encounter medications on file as of 10/03/2018.

## 2018-10-03 ENCOUNTER — Encounter: Payer: Self-pay | Admitting: Family Medicine

## 2018-10-03 ENCOUNTER — Ambulatory Visit (INDEPENDENT_AMBULATORY_CARE_PROVIDER_SITE_OTHER): Payer: PPO | Admitting: Family Medicine

## 2018-10-03 ENCOUNTER — Other Ambulatory Visit: Payer: Self-pay

## 2018-10-03 VITALS — BP 110/72 | HR 96 | Temp 98.0°F | Ht 68.5 in | Wt 226.0 lb

## 2018-10-03 DIAGNOSIS — M961 Postlaminectomy syndrome, not elsewhere classified: Secondary | ICD-10-CM | POA: Diagnosis not present

## 2018-10-03 DIAGNOSIS — F411 Generalized anxiety disorder: Secondary | ICD-10-CM

## 2018-10-03 DIAGNOSIS — G8929 Other chronic pain: Secondary | ICD-10-CM

## 2018-10-03 DIAGNOSIS — F1190 Opioid use, unspecified, uncomplicated: Secondary | ICD-10-CM

## 2018-10-03 DIAGNOSIS — F331 Major depressive disorder, recurrent, moderate: Secondary | ICD-10-CM | POA: Diagnosis not present

## 2018-10-03 DIAGNOSIS — M545 Low back pain, unspecified: Secondary | ICD-10-CM

## 2018-10-03 MED ORDER — HYDROCODONE-ACETAMINOPHEN 10-325 MG PO TABS: ORAL_TABLET | ORAL | 0 refills | Status: DC

## 2018-10-03 MED ORDER — HYDROCODONE-ACETAMINOPHEN 10-325 MG PO TABS: 1.0000 | ORAL_TABLET | Freq: Four times a day (QID) | ORAL | 0 refills | Status: DC | PRN

## 2018-10-05 ENCOUNTER — Encounter: Payer: Self-pay | Admitting: Family Medicine

## 2018-11-09 ENCOUNTER — Other Ambulatory Visit: Payer: Self-pay

## 2018-11-09 ENCOUNTER — Ambulatory Visit (INDEPENDENT_AMBULATORY_CARE_PROVIDER_SITE_OTHER): Payer: PPO

## 2018-11-09 DIAGNOSIS — Z23 Encounter for immunization: Secondary | ICD-10-CM | POA: Diagnosis not present

## 2018-11-09 NOTE — Progress Notes (Signed)
Per orders of Spencer Copland, injection of Pneumovax and Flu vaccine given by Lurlean Nanny.  Patient tolerated injection well.

## 2018-11-10 ENCOUNTER — Encounter: Payer: Self-pay | Admitting: Family Medicine

## 2018-11-10 DIAGNOSIS — H2513 Age-related nuclear cataract, bilateral: Secondary | ICD-10-CM | POA: Diagnosis not present

## 2018-11-10 DIAGNOSIS — H5340 Unspecified visual field defects: Secondary | ICD-10-CM | POA: Diagnosis not present

## 2018-11-10 DIAGNOSIS — H5211 Myopia, right eye: Secondary | ICD-10-CM | POA: Diagnosis not present

## 2018-11-10 LAB — HM DIABETES EYE EXAM

## 2018-11-28 ENCOUNTER — Other Ambulatory Visit: Payer: Self-pay | Admitting: Family Medicine

## 2018-11-28 NOTE — Telephone Encounter (Signed)
Last office visit 10/03/2018 for pain management.  Last refilled 07/26/2018 for #60 with 3 refills.   UDS/[place] 12/24/2017.  Next Appt: 01/02/2019 for 3 month pain management.

## 2019-01-02 ENCOUNTER — Encounter: Payer: Self-pay | Admitting: Family Medicine

## 2019-01-02 ENCOUNTER — Ambulatory Visit (INDEPENDENT_AMBULATORY_CARE_PROVIDER_SITE_OTHER): Payer: PPO | Admitting: Family Medicine

## 2019-01-02 ENCOUNTER — Other Ambulatory Visit: Payer: Self-pay

## 2019-01-02 VITALS — BP 110/74 | HR 85 | Temp 98.2°F | Ht 68.5 in | Wt 230.2 lb

## 2019-01-02 DIAGNOSIS — M961 Postlaminectomy syndrome, not elsewhere classified: Secondary | ICD-10-CM | POA: Diagnosis not present

## 2019-01-02 DIAGNOSIS — E119 Type 2 diabetes mellitus without complications: Secondary | ICD-10-CM

## 2019-01-02 DIAGNOSIS — F1190 Opioid use, unspecified, uncomplicated: Secondary | ICD-10-CM

## 2019-01-02 DIAGNOSIS — F411 Generalized anxiety disorder: Secondary | ICD-10-CM

## 2019-01-02 LAB — POCT GLYCOSYLATED HEMOGLOBIN (HGB A1C): Hemoglobin A1C: 6.1 % — AB (ref 4.0–5.6)

## 2019-01-02 MED ORDER — HYDROCODONE-ACETAMINOPHEN 10-325 MG PO TABS: 1.0000 | ORAL_TABLET | Freq: Four times a day (QID) | ORAL | 0 refills | Status: DC | PRN

## 2019-01-02 MED ORDER — HYDROCODONE-ACETAMINOPHEN 10-325 MG PO TABS: ORAL_TABLET | ORAL | 0 refills | Status: DC

## 2019-01-02 NOTE — Progress Notes (Signed)
Matthew Bracy T. Matthew Blake, MD Primary Care and [place] at [facility] [name], [address] Phone: [phone]  FAX: [phone]  [name] - 58 y.o. male  MRN [id number]  Date of Birth: 1960-07-19  Visit Date: 01/02/2019  PCP: Owens Loffler, MD  Referred by: Owens Loffler, MD  Chief Complaint  Patient presents with  . Pain Management   Subjective:   Matthew Eaton is a 58 y.o. very pleasant male patient who presents with the following:  Matthew Eaton is here for pain management from neck and chronic back pain, failed spine syndrome.  Indication for chronic opioid: failed neck syndrome, chronic pain Medication and dose: Norco 10 # pills per month: 120 Last UDS date: today Opioid Treatment Agreement signed (Y/N):  Yes Opioid Treatment Agreement last reviewed with patient:  01/02/2019 [facility] reviewed this encounter (include red flags):   01/02/2019  8:40 AM EST   Hips bother im more than anything right now. Lower buttocks region.  Neck will hurt a lot. No deadaches like she used to and then turned around and fell.  Floor mopped.  No other drugs. CBD seems to help.    Anxiety has worsened some.   He also needs basic DM follow-up and we will get an A1c today.  Past Medical History, Surgical History, Social History, Family History, Problem List, Medications, and Allergies have been reviewed and updated if relevant.  Patient Active Problem List   Diagnosis Date Noted  . Chronic, continuous use of opioids 12/24/2017  . Diabetes mellitus type 2, uncomplicated ([place]) 99991111  . 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 Chronic bilateral low back pain without sciatica 06/04/2016  . Vitamin B12 deficiency 10/15/2015  . Family hx of ALS (amyotrophic lateral sclerosis) 05/27/2013  . Major depressive disorder, recurrent episode, moderate ([place]) 05/27/2013  . Obesity (BMI 30-39.9) 05/22/2013  . Insomnia 05/20/2012  .  Hiatal hernia 01/05/2011  . [place] DISEASE, CERVICAL 01/22/2010  . TOBACCO ABUSE, HX OF 01/22/2010  . Generalized anxiety disorder 03/06/2009  . Hypogonadism in male 12/19/2008  . Hyperlipidemia 11/15/2008  . GERD 11/15/2008    Past Medical History:  Diagnosis Date  . Chronic neck pain 05/20/2012  . Family hx of ALS (amyotrophic lateral sclerosis) 05/27/2013  . GERD (gastroesophageal reflux disease)   . Hiatal hernia   . HLD (hyperlipidemia)   . Major depressive disorder, recurrent episode, moderate ([place]) 05/27/2013  . Neck pain    cervical spine with herniation  . Tobacco abuse   . Wrist fracture, left     Past Surgical History:  Procedure Laterality Date  . NECK SURGERY     x2- 2010, 2011    Social History   Socioeconomic History  . Marital status: Married    Spouse name: Not on file  . Number of children: 5  . Years of education: 10  . Highest education level: Not on file  Occupational History  . Not on file  Social Needs  . Financial resource strain: Not on file  . Food insecurity    Worry: Not on file    Inability: Not on file  . Transportation needs    Medical: Not on file    Non-medical: Not on file  Tobacco Use  . Smoking status: Former Smoker    Years: 30.00    Quit date: 02/03/2007    Years since quitting: 11.9  . Smokeless tobacco: Never Used  Substance and Sexual Activity  . Alcohol  use: No  . Drug use: No  . Sexual activity: Yes    Partners: Female  Lifestyle  . Physical activity    Days per week: Not on file    Minutes per session: Not on file  . Stress: Not on file  Relationships  . Social Herbalist on phone: Not on file    Gets together: Not on file    Attends religious service: Not on file    Active member of club or organization: Not on file    Attends meetings of clubs or organizations: Not on file    Relationship status: Not on file  . Intimate partner violence    Fear of current or ex partner: Not on file    Emotionally  abused: Not on file    Physically abused: Not on file    Forced sexual activity: Not on file  Other Topics Concern  . Not on file  Social History Narrative   Lives home with wife, Matthew Eaton.  Education 12th grade.  Five children.    Family History  Problem Relation Age of Onset  . ALS Mother   . ALS Brother   . ALS Maternal Uncle   . ALS Maternal Aunt   . Colon cancer Neg Hx   . Esophageal cancer Neg Hx   . Rectal cancer Neg Hx   . Stomach cancer Neg Hx     Allergies  Allergen Reactions  . Methocarbamol Shortness Of Breath  . Gabapentin     Medication list reviewed and updated in full in [place].   GEN: No acute illnesses, no fevers, chills. GI: No n/v/d, eating normally Pulm: No SOB Interactive and getting along well at home.  Otherwise, ROS is as per the HPI.  Objective:   BP 110/74   Pulse 85   Temp 98.2 F (36.8 C) (Temporal)   Ht 5' 8.5" (1.74 m)   Wt 230 lb 4 oz (104.4 kg)   SpO2 92%   BMI 34.50 kg/m   GEN: WDWN, NAD, Non-toxic, A & O x 3 HEENT: Atraumatic, Normocephalic. Neck supple. No masses, No LAD. Ears and Nose: No external deformity. CV: RRR, No M/G/R. No JVD. No thrill. No extra heart sounds. PULM: CTA B, no wheezes, crackles, rhonchi. No retractions. No resp. distress. No accessory muscle use. EXTR: No c/c/e NEURO Normal gait.  PSYCH: Normally interactive. Conversant. Not depressed or anxious appearing.  Calm demeanor.   HIP EXAM: SIDE: b ROM: Abduction, Flexion, Internal and External range of motion: full Pain with terminal IROM and EROM: no GTB: NT SLR: NEG    Laboratory and Imaging Data:  Assessment and Plan:     ICD-10-CM   1. Failed neck syndrome  M96.1 Pain Mgmt, Profile 8 w/Conf, U    HYDROcodone-acetaminophen (NORCO) 10-325 MG tablet    HYDROcodone-acetaminophen (NORCO) 10-325 MG tablet    HYDROcodone-acetaminophen (NORCO) 10-325 MG tablet  2. Chronic, continuous use of opioids  F11.90 Pain Mgmt, Profile 8  w/Conf, U    HYDROcodone-acetaminophen (NORCO) 10-325 MG tablet    HYDROcodone-acetaminophen (NORCO) 10-325 MG tablet    HYDROcodone-acetaminophen (NORCO) 10-325 MG tablet  3. Generalized anxiety disorder  F41.1   4. Type 2 diabetes mellitus without complication, without long-term current use of insulin (HCC)  E11.9 Hemoglobin A1c   Pain is basically stable with long-standing neck and back pain.  Hip pain is referred back pain to buttocks.  GAD +/- on many meds.  Enc to get  out of the house and walk as able in winter  Recheck a1c  Follow-up: Return in about 3 months (around 04/02/2019).  Meds ordered this encounter  Medications  . HYDROcodone-acetaminophen (NORCO) 10-325 MG tablet    Sig: TAKE 1 TABLET BY MOUTH EVERY 6 HOURS AS NEEDED FOR SEVERE PAIN.    Dispense:  120 tablet    Refill:  0    DO NOT FILL UNTIL 03/04/2019  . HYDROcodone-acetaminophen (NORCO) 10-325 MG tablet    Sig: TAKE 1 TABLET BY MOUTH EVERY 6 HOURS AS NEEDED FOR SEVERE PAIN    Dispense:  120 tablet    Refill:  0    DO NOT FILL UNTIL 02/01/2019  . HYDROcodone-acetaminophen (NORCO) 10-325 MG tablet    Sig: Take 1 tablet by mouth every 6 (six) hours as needed for severe pain.    Dispense:  120 tablet    Refill:  0   Orders Placed This Encounter  Procedures  . Pain Mgmt, Profile 8 w/Conf, U  . Hemoglobin A1c    Signed,  Abeera Flannery T. Major [place], MD   Outpatient Encounter Medications as of 01/02/2019  Medication Sig  . aspirin 81 MG tablet Take 81 mg by mouth daily.    Marland Kitchen atorvastatin (LIPITOR) 80 MG tablet TAKE 1 TABLET BY MOUTH DAILY AT 6 PM.  . buPROPion (WELLBUTRIN XL) 300 MG 24 hr tablet Take 1 tablet (300 mg total) by mouth daily.  . cholecalciferol (VITAMIN D) 1000 units tablet Take 2,000 Units by mouth daily.  . citalopram (CELEXA) 40 MG tablet TAKE 1 TABLET BY MOUTH DAILY.  . clonazePAM (KLONOPIN) 0.5 MG tablet TAKE 1 TABLET BY MOUTH TWICE A DAY AS NEEDED  . nortriptyline (PAMELOR) 25 MG capsule  Take 1 capsule (25 mg total) by mouth at bedtime.  Marland Kitchen omeprazole (PRILOSEC) 20 MG capsule TAKE 1 CAPSULE BY MOUTH 2 TIMES DAILY BEFORE A MEAL.  . [DISCONTINUED] HYDROcodone-acetaminophen (NORCO) 10-325 MG tablet TAKE 1 TABLET BY MOUTH EVERY 6 HOURS AS NEEDED FOR SEVERE PAIN.  . [DISCONTINUED] HYDROcodone-acetaminophen (NORCO) 10-325 MG tablet TAKE 1 TABLET BY MOUTH EVERY 6 HOURS AS NEEDED FOR SEVERE PAIN  . [DISCONTINUED] HYDROcodone-acetaminophen (NORCO) 10-325 MG tablet Take 1 tablet by mouth every 6 (six) hours as needed for severe pain.  . [DISCONTINUED] methylPREDNISolone (MEDROL DOSEPAK) 4 MG TBPK tablet Follow box direction  . HYDROcodone-acetaminophen (NORCO) 10-325 MG tablet TAKE 1 TABLET BY MOUTH EVERY 6 HOURS AS NEEDED FOR SEVERE PAIN.  . HYDROcodone-acetaminophen (NORCO) 10-325 MG tablet TAKE 1 TABLET BY MOUTH EVERY 6 HOURS AS NEEDED FOR SEVERE PAIN  . HYDROcodone-acetaminophen (NORCO) 10-325 MG tablet Take 1 tablet by mouth every 6 (six) hours as needed for severe pain.   No facility-administered encounter medications on file as of 01/02/2019.

## 2019-01-02 NOTE — Addendum Note (Signed)
Addended by: Cloyd Stagers on: 01/02/2019 09:13 AM   Modules accepted: Orders

## 2019-01-04 LAB — PAIN MGMT, PROFILE 8 W/CONF, U
6 Acetylmorphine: NEGATIVE ng/mL
Alcohol Metabolites: NEGATIVE ng/mL (ref <500)
Alphahydroxyalprazolam: NEGATIVE ng/mL
Alphahydroxymidazolam: NEGATIVE ng/mL
Alphahydroxytriazolam: NEGATIVE ng/mL
Aminoclonazepam: 499 ng/mL
Amphetamine: NEGATIVE ng/mL
Amphetamines: NEGATIVE ng/mL
Benzodiazepines: POSITIVE ng/mL
Buprenorphine, Urine: NEGATIVE ng/mL
Cocaine Metabolite: NEGATIVE ng/mL
Codeine: NEGATIVE ng/mL
Creatinine: >300 mg/dL
Hydrocodone: >10000 ng/mL
Hydromorphone: 150 ng/mL
Hydroxyethylflurazepam: NEGATIVE ng/mL
Lorazepam: NEGATIVE ng/mL
MDMA: NEGATIVE ng/mL
Marijuana Metabolite: 11 ng/mL
Marijuana Metabolite: POSITIVE ng/mL
Methamphetamine: NEGATIVE ng/mL
Morphine: NEGATIVE ng/mL
Nordiazepam: NEGATIVE ng/mL
Norhydrocodone: 9426 ng/mL
Opiates: POSITIVE ng/mL
Oxazepam: NEGATIVE ng/mL
Oxidant: NEGATIVE ug/mL
Oxycodone: NEGATIVE ng/mL
Temazepam: NEGATIVE ng/mL
pH: 6.2 (ref 4.5–9.0)

## 2019-01-09 ENCOUNTER — Other Ambulatory Visit: Payer: Self-pay | Admitting: Family Medicine

## 2019-01-24 ENCOUNTER — Other Ambulatory Visit: Payer: Self-pay | Admitting: Family Medicine

## 2019-04-02 NOTE — Progress Notes (Signed)
Matthew Acoff T. Mellie Buccellato, MD Primary Care and [place] at [facility] [place], [address] Phone: [phone]  FAX: [phone]  [name] - 59 y.o. male  MRN [id number]  Date of Birth: 03/04/1960  Visit Date: 04/03/2019  PCP: Matthew Loffler, MD  Referred by: Matthew Loffler, MD  Chief Complaint  Patient presents with  . Pain Management    This visit occurred during the SARS-CoV-2 public health emergency.  Safety protocols were in place, including screening questions prior to the visit, additional usage of staff PPE, and extensive cleaning of exam room while observing appropriate contact time as indicated for disinfecting solutions.   Subjective:   Matthew Eaton is a 59 y.o. very pleasant male patient who presents with the following:  Matthew Eaton is here for a follow-up visit in regards to his pain management, failed neck syndrome.  He also has some ongoing anxiety and depression.  Indication for chronic opioid: failed neck syndrome, chronic pain Medication and dose: Norco 10 # pills per month: 120 Last UDS date: 12/2018 Opioid Treatment Agreement signed (Y/N):  Yes Opioid Treatment Agreement last reviewed with patient:  04/03/2019 [facility] reviewed this encounter (include red flags):   04/03/2019  9:20 AM EST   Past 2 weeks has hurt all over.  Shoulders and neck.  With two bad will get a headache.  Mostly on the left side.   He still has some occasional depression he also gets some anxiety attacks.  He is very compliant with taking his medication.  He does take Klonopin anywhere from 0 times daily to twice a day.  Review of Systems is noted in the HPI, as appropriate  Objective:   BP 110/74   Pulse 83   Temp 98.2 F (36.8 C) (Temporal)   Ht 5' 8.5" (1.74 m)   Wt 231 lb 8 oz (105 kg)   SpO2 93%   BMI 34.69 kg/m   GEN: No acute distress; alert,appropriate. PULM: Breathing comfortably in no  respiratory distress PSYCH: Normally interactive.   Full range of motion in her neck, shoulders and elbows.  Strength in his upper extremities is 5/5.  Neurovascularly intact.  Laboratory and Imaging Data:  Assessment and Plan:     ICD-10-CM   1. Failed neck syndrome  M96.1 HYDROcodone-acetaminophen (NORCO) 10-325 MG tablet    HYDROcodone-acetaminophen (NORCO) 10-325 MG tablet    HYDROcodone-acetaminophen (NORCO) 10-325 MG tablet  2. Chronic bilateral low back pain without sciatica  M54.5    G89.29   3. Chronic, continuous use of opioids  F11.90 HYDROcodone-acetaminophen (NORCO) 10-325 MG tablet    HYDROcodone-acetaminophen (NORCO) 10-325 MG tablet    HYDROcodone-acetaminophen (NORCO) 10-325 MG tablet  4. Type 2 diabetes mellitus without complication, without long-term current use of insulin (HCC)  E11.9    Stable failed neck syndrome and chronic pain.  Continues to have some mild anxiety and mild depression.  Is compliant taking his medication.  He has been doing a fair job with his diet.  Follow-up: Return in about 3 months (around 07/04/2019).  Meds ordered this encounter  Medications  . HYDROcodone-acetaminophen (NORCO) 10-325 MG tablet    Sig: TAKE 1 TABLET BY MOUTH EVERY 6 HOURS AS NEEDED FOR SEVERE PAIN.    Dispense:  120 tablet    Refill:  0    DO NOT FILL UNTIL 06/03/2019  . HYDROcodone-acetaminophen (NORCO) 10-325 MG tablet    Sig: TAKE 1 TABLET BY MOUTH EVERY 6  HOURS AS NEEDED FOR SEVERE PAIN    Dispense:  120 tablet    Refill:  0    DO NOT FILL UNTIL 05/04/2019  . HYDROcodone-acetaminophen (NORCO) 10-325 MG tablet    Sig: Take 1 tablet by mouth every 6 (six) hours as needed for severe pain.    Dispense:  120 tablet    Refill:  0   Medications Discontinued During This Encounter  Medication Reason  . HYDROcodone-acetaminophen (NORCO) 10-325 MG tablet Reorder  . HYDROcodone-acetaminophen (NORCO) 10-325 MG tablet Reorder  . HYDROcodone-acetaminophen (NORCO) 10-325  MG tablet Reorder   No orders of the defined types were placed in this encounter.   Signed,  Maud Deed. Arlan Birks, MD   Outpatient Encounter Medications as of 04/03/2019  Medication Sig  . aspirin 81 MG tablet Take 81 mg by mouth daily.    Marland Kitchen atorvastatin (LIPITOR) 80 MG tablet TAKE 1 TABLET BY MOUTH DAILY AT 6 PM.  . buPROPion (WELLBUTRIN XL) 300 MG 24 hr tablet TAKE 1 TABLET BY MOUTH DAILY.  . cholecalciferol (VITAMIN D) 1000 units tablet Take 2,000 Units by mouth daily.  . citalopram (CELEXA) 40 MG tablet TAKE 1 TABLET BY MOUTH DAILY.  . clonazePAM (KLONOPIN) 0.5 MG tablet TAKE 1 TABLET BY MOUTH TWICE A DAY AS NEEDED  . HYDROcodone-acetaminophen (NORCO) 10-325 MG tablet TAKE 1 TABLET BY MOUTH EVERY 6 HOURS AS NEEDED FOR SEVERE PAIN.  . HYDROcodone-acetaminophen (NORCO) 10-325 MG tablet TAKE 1 TABLET BY MOUTH EVERY 6 HOURS AS NEEDED FOR SEVERE PAIN  . HYDROcodone-acetaminophen (NORCO) 10-325 MG tablet Take 1 tablet by mouth every 6 (six) hours as needed for severe pain.  . nortriptyline (PAMELOR) 25 MG capsule Take 1 capsule (25 mg total) by mouth at bedtime.  Marland Kitchen omeprazole (PRILOSEC) 20 MG capsule TAKE 1 CAPSULE BY MOUTH 2 TIMES DAILY BEFORE A MEAL.  . [DISCONTINUED] HYDROcodone-acetaminophen (NORCO) 10-325 MG tablet TAKE 1 TABLET BY MOUTH EVERY 6 HOURS AS NEEDED FOR SEVERE PAIN.  . [DISCONTINUED] HYDROcodone-acetaminophen (NORCO) 10-325 MG tablet TAKE 1 TABLET BY MOUTH EVERY 6 HOURS AS NEEDED FOR SEVERE PAIN  . [DISCONTINUED] HYDROcodone-acetaminophen (NORCO) 10-325 MG tablet Take 1 tablet by mouth every 6 (six) hours as needed for severe pain.   No facility-administered encounter medications on file as of 04/03/2019.

## 2019-04-03 ENCOUNTER — Ambulatory Visit (INDEPENDENT_AMBULATORY_CARE_PROVIDER_SITE_OTHER): Payer: PPO | Admitting: Family Medicine

## 2019-04-03 ENCOUNTER — Encounter: Payer: Self-pay | Admitting: Family Medicine

## 2019-04-03 ENCOUNTER — Other Ambulatory Visit: Payer: Self-pay

## 2019-04-03 VITALS — BP 110/74 | HR 83 | Temp 98.2°F | Ht 68.5 in | Wt 231.5 lb

## 2019-04-03 DIAGNOSIS — M961 Postlaminectomy syndrome, not elsewhere classified: Secondary | ICD-10-CM | POA: Diagnosis not present

## 2019-04-03 DIAGNOSIS — M545 Low back pain: Secondary | ICD-10-CM | POA: Diagnosis not present

## 2019-04-03 DIAGNOSIS — E119 Type 2 diabetes mellitus without complications: Secondary | ICD-10-CM | POA: Diagnosis not present

## 2019-04-03 DIAGNOSIS — G8929 Other chronic pain: Secondary | ICD-10-CM

## 2019-04-03 DIAGNOSIS — F1190 Opioid use, unspecified, uncomplicated: Secondary | ICD-10-CM | POA: Diagnosis not present

## 2019-04-03 MED ORDER — HYDROCODONE-ACETAMINOPHEN 10-325 MG PO TABS: ORAL_TABLET | ORAL | 0 refills | Status: AC

## 2019-04-03 MED ORDER — HYDROCODONE-ACETAMINOPHEN 10-325 MG PO TABS: 1.0000 | ORAL_TABLET | Freq: Four times a day (QID) | ORAL | 0 refills | Status: AC | PRN

## 2019-05-03 ENCOUNTER — Inpatient Hospital Stay (HOSPITAL_COMMUNITY): Payer: PPO

## 2019-05-03 ENCOUNTER — Encounter (HOSPITAL_COMMUNITY): Payer: Self-pay

## 2019-05-03 ENCOUNTER — Other Ambulatory Visit: Payer: Self-pay

## 2019-05-03 ENCOUNTER — Ambulatory Visit (INDEPENDENT_AMBULATORY_CARE_PROVIDER_SITE_OTHER): Payer: PPO | Admitting: Internal Medicine

## 2019-05-03 ENCOUNTER — Inpatient Hospital Stay (HOSPITAL_COMMUNITY)
Admission: EM | Admit: 2019-05-03 | Discharge: 2019-06-03 | DRG: 004 | Disposition: E | Payer: PPO | Attending: Internal Medicine | Admitting: Internal Medicine

## 2019-05-03 ENCOUNTER — Encounter: Payer: Self-pay | Admitting: Internal Medicine

## 2019-05-03 ENCOUNTER — Emergency Department (HOSPITAL_COMMUNITY): Payer: PPO

## 2019-05-03 DIAGNOSIS — R0602 Shortness of breath: Secondary | ICD-10-CM

## 2019-05-03 DIAGNOSIS — J9601 Acute respiratory failure with hypoxia: Secondary | ICD-10-CM

## 2019-05-03 DIAGNOSIS — Z9911 Dependence on respirator [ventilator] status: Secondary | ICD-10-CM | POA: Diagnosis not present

## 2019-05-03 DIAGNOSIS — I083 Combined rheumatic disorders of mitral, aortic and tricuspid valves: Secondary | ICD-10-CM | POA: Diagnosis present

## 2019-05-03 DIAGNOSIS — U071 COVID-19: Secondary | ICD-10-CM | POA: Diagnosis present

## 2019-05-03 DIAGNOSIS — J15211 Pneumonia due to Methicillin susceptible Staphylococcus aureus: Secondary | ICD-10-CM | POA: Diagnosis not present

## 2019-05-03 DIAGNOSIS — F411 Generalized anxiety disorder: Secondary | ICD-10-CM | POA: Diagnosis not present

## 2019-05-03 DIAGNOSIS — Z79899 Other long term (current) drug therapy: Secondary | ICD-10-CM

## 2019-05-03 DIAGNOSIS — K219 Gastro-esophageal reflux disease without esophagitis: Secondary | ICD-10-CM | POA: Diagnosis not present

## 2019-05-03 DIAGNOSIS — J939 Pneumothorax, unspecified: Secondary | ICD-10-CM | POA: Diagnosis not present

## 2019-05-03 DIAGNOSIS — J9600 Acute respiratory failure, unspecified whether with hypoxia or hypercapnia: Secondary | ICD-10-CM | POA: Diagnosis not present

## 2019-05-03 DIAGNOSIS — R9431 Abnormal electrocardiogram [ECG] [EKG]: Secondary | ICD-10-CM | POA: Diagnosis present

## 2019-05-03 DIAGNOSIS — Z9119 Patient's noncompliance with other medical treatment and regimen: Secondary | ICD-10-CM

## 2019-05-03 DIAGNOSIS — Y838 Other surgical procedures as the cause of abnormal reaction of the patient, or of later complication, without mention of misadventure at the time of the procedure: Secondary | ICD-10-CM | POA: Diagnosis not present

## 2019-05-03 DIAGNOSIS — R7989 Other specified abnormal findings of blood chemistry: Secondary | ICD-10-CM | POA: Diagnosis present

## 2019-05-03 DIAGNOSIS — J1282 Pneumonia due to coronavirus disease 2019: Secondary | ICD-10-CM | POA: Diagnosis present

## 2019-05-03 DIAGNOSIS — R609 Edema, unspecified: Secondary | ICD-10-CM | POA: Diagnosis not present

## 2019-05-03 DIAGNOSIS — Z7982 Long term (current) use of aspirin: Secondary | ICD-10-CM

## 2019-05-03 DIAGNOSIS — J150 Pneumonia due to Klebsiella pneumoniae: Secondary | ICD-10-CM | POA: Diagnosis not present

## 2019-05-03 DIAGNOSIS — K760 Fatty (change of) liver, not elsewhere classified: Secondary | ICD-10-CM | POA: Diagnosis not present

## 2019-05-03 DIAGNOSIS — E870 Hyperosmolality and hypernatremia: Secondary | ICD-10-CM | POA: Diagnosis not present

## 2019-05-03 DIAGNOSIS — Z515 Encounter for palliative care: Secondary | ICD-10-CM | POA: Diagnosis not present

## 2019-05-03 DIAGNOSIS — E785 Hyperlipidemia, unspecified: Secondary | ICD-10-CM | POA: Diagnosis present

## 2019-05-03 DIAGNOSIS — R400 Somnolence: Secondary | ICD-10-CM | POA: Diagnosis not present

## 2019-05-03 DIAGNOSIS — R000 Tachycardia, unspecified: Secondary | ICD-10-CM | POA: Diagnosis not present

## 2019-05-03 DIAGNOSIS — D649 Anemia, unspecified: Secondary | ICD-10-CM | POA: Diagnosis not present

## 2019-05-03 DIAGNOSIS — R918 Other nonspecific abnormal finding of lung field: Secondary | ICD-10-CM | POA: Diagnosis not present

## 2019-05-03 DIAGNOSIS — I5031 Acute diastolic (congestive) heart failure: Secondary | ICD-10-CM | POA: Diagnosis not present

## 2019-05-03 DIAGNOSIS — G9340 Encephalopathy, unspecified: Secondary | ICD-10-CM | POA: Diagnosis not present

## 2019-05-03 DIAGNOSIS — E119 Type 2 diabetes mellitus without complications: Secondary | ICD-10-CM | POA: Diagnosis not present

## 2019-05-03 DIAGNOSIS — Z781 Physical restraint status: Secondary | ICD-10-CM

## 2019-05-03 DIAGNOSIS — J869 Pyothorax without fistula: Secondary | ICD-10-CM | POA: Diagnosis not present

## 2019-05-03 DIAGNOSIS — M542 Cervicalgia: Secondary | ICD-10-CM | POA: Diagnosis present

## 2019-05-03 DIAGNOSIS — Z452 Encounter for adjustment and management of vascular access device: Secondary | ICD-10-CM | POA: Diagnosis not present

## 2019-05-03 DIAGNOSIS — R7401 Elevation of levels of liver transaminase levels: Secondary | ICD-10-CM | POA: Diagnosis not present

## 2019-05-03 DIAGNOSIS — Y95 Nosocomial condition: Secondary | ICD-10-CM | POA: Diagnosis not present

## 2019-05-03 DIAGNOSIS — G894 Chronic pain syndrome: Secondary | ICD-10-CM | POA: Diagnosis present

## 2019-05-03 DIAGNOSIS — R748 Abnormal levels of other serum enzymes: Secondary | ICD-10-CM | POA: Diagnosis present

## 2019-05-03 DIAGNOSIS — J439 Emphysema, unspecified: Secondary | ICD-10-CM | POA: Diagnosis not present

## 2019-05-03 DIAGNOSIS — R0902 Hypoxemia: Secondary | ICD-10-CM | POA: Diagnosis not present

## 2019-05-03 DIAGNOSIS — J95811 Postprocedural pneumothorax: Secondary | ICD-10-CM | POA: Diagnosis not present

## 2019-05-03 DIAGNOSIS — R509 Fever, unspecified: Secondary | ICD-10-CM | POA: Diagnosis not present

## 2019-05-03 DIAGNOSIS — J189 Pneumonia, unspecified organism: Secondary | ICD-10-CM | POA: Diagnosis present

## 2019-05-03 DIAGNOSIS — Z87891 Personal history of nicotine dependence: Secondary | ICD-10-CM | POA: Diagnosis not present

## 2019-05-03 DIAGNOSIS — G9341 Metabolic encephalopathy: Secondary | ICD-10-CM | POA: Diagnosis not present

## 2019-05-03 DIAGNOSIS — Z9114 Patient's other noncompliance with medication regimen: Secondary | ICD-10-CM

## 2019-05-03 DIAGNOSIS — R339 Retention of urine, unspecified: Secondary | ICD-10-CM | POA: Diagnosis not present

## 2019-05-03 DIAGNOSIS — F331 Major depressive disorder, recurrent, moderate: Secondary | ICD-10-CM | POA: Diagnosis not present

## 2019-05-03 DIAGNOSIS — J188 Other pneumonia, unspecified organism: Secondary | ICD-10-CM | POA: Diagnosis present

## 2019-05-03 DIAGNOSIS — F419 Anxiety disorder, unspecified: Secondary | ICD-10-CM | POA: Diagnosis present

## 2019-05-03 DIAGNOSIS — J181 Lobar pneumonia, unspecified organism: Secondary | ICD-10-CM | POA: Diagnosis not present

## 2019-05-03 DIAGNOSIS — J80 Acute respiratory distress syndrome: Secondary | ICD-10-CM | POA: Diagnosis not present

## 2019-05-03 DIAGNOSIS — Z79891 Long term (current) use of opiate analgesic: Secondary | ICD-10-CM

## 2019-05-03 DIAGNOSIS — R5381 Other malaise: Secondary | ICD-10-CM | POA: Diagnosis not present

## 2019-05-03 DIAGNOSIS — J1289 Other viral pneumonia: Secondary | ICD-10-CM | POA: Diagnosis not present

## 2019-05-03 DIAGNOSIS — E876 Hypokalemia: Secondary | ICD-10-CM | POA: Diagnosis present

## 2019-05-03 DIAGNOSIS — J9690 Respiratory failure, unspecified, unspecified whether with hypoxia or hypercapnia: Secondary | ICD-10-CM | POA: Diagnosis not present

## 2019-05-03 DIAGNOSIS — J8410 Pulmonary fibrosis, unspecified: Secondary | ICD-10-CM | POA: Diagnosis present

## 2019-05-03 DIAGNOSIS — E781 Pure hyperglyceridemia: Secondary | ICD-10-CM | POA: Diagnosis present

## 2019-05-03 DIAGNOSIS — Z820 Family history of epilepsy and other diseases of the nervous system: Secondary | ICD-10-CM

## 2019-05-03 DIAGNOSIS — D539 Nutritional anemia, unspecified: Secondary | ICD-10-CM | POA: Diagnosis present

## 2019-05-03 DIAGNOSIS — Z888 Allergy status to other drugs, medicaments and biological substances status: Secondary | ICD-10-CM

## 2019-05-03 DIAGNOSIS — N179 Acute kidney failure, unspecified: Secondary | ICD-10-CM | POA: Diagnosis present

## 2019-05-03 DIAGNOSIS — Z8659 Personal history of other mental and behavioral disorders: Secondary | ICD-10-CM | POA: Diagnosis not present

## 2019-05-03 DIAGNOSIS — Z930 Tracheostomy status: Secondary | ICD-10-CM | POA: Diagnosis not present

## 2019-05-03 DIAGNOSIS — Z4682 Encounter for fitting and adjustment of non-vascular catheter: Secondary | ICD-10-CM | POA: Diagnosis not present

## 2019-05-03 DIAGNOSIS — D72829 Elevated white blood cell count, unspecified: Secondary | ICD-10-CM | POA: Diagnosis not present

## 2019-05-03 DIAGNOSIS — G8929 Other chronic pain: Secondary | ICD-10-CM | POA: Diagnosis not present

## 2019-05-03 DIAGNOSIS — J984 Other disorders of lung: Secondary | ICD-10-CM | POA: Diagnosis not present

## 2019-05-03 LAB — COMPREHENSIVE METABOLIC PANEL
ALT: 59 U/L — ABNORMAL HIGH (ref 0–44)
AST: 72 U/L — ABNORMAL HIGH (ref 15–41)
Albumin: 3.1 g/dL — ABNORMAL LOW (ref 3.5–5.0)
Alkaline Phosphatase: 87 U/L (ref 38–126)
Anion gap: 12 (ref 5–15)
BUN: 15 mg/dL (ref 6–20)
CO2: 23 mmol/L (ref 22–32)
Calcium: 8.7 mg/dL — ABNORMAL LOW (ref 8.9–10.3)
Chloride: 104 mmol/L (ref 98–111)
Creatinine, Ser: 1.24 mg/dL (ref 0.61–1.24)
GFR calc Af Amer: >60 mL/min (ref >60)
GFR calc non Af Amer: >60 mL/min (ref >60)
Glucose, Bld: 108 mg/dL — ABNORMAL HIGH (ref 70–99)
Potassium: 3.8 mmol/L (ref 3.5–5.1)
Sodium: 139 mmol/L (ref 135–145)
Total Bilirubin: 0.5 mg/dL (ref 0.3–1.2)
Total Protein: 6.6 g/dL (ref 6.5–8.1)

## 2019-05-03 LAB — CBC
HCT: 40.3 % (ref 39.0–52.0)
Hemoglobin: 13 g/dL (ref 13.0–17.0)
MCH: 29.8 pg (ref 26.0–34.0)
MCHC: 32.3 g/dL (ref 30.0–36.0)
MCV: 92.4 fL (ref 80.0–100.0)
Platelets: 196 10*3/uL (ref 150–400)
RBC: 4.36 MIL/uL (ref 4.22–5.81)
RDW: 13.2 % (ref 11.5–15.5)
WBC: 4.5 10*3/uL (ref 4.0–10.5)
nRBC: 0 % (ref 0.0–0.2)

## 2019-05-03 LAB — URINALYSIS, ROUTINE W REFLEX MICROSCOPIC
Bilirubin Urine: NEGATIVE
Glucose, UA: NEGATIVE mg/dL
Hgb urine dipstick: NEGATIVE
Ketones, ur: NEGATIVE mg/dL
Leukocytes,Ua: NEGATIVE
Nitrite: NEGATIVE
Protein, ur: 30 mg/dL — AB
Specific Gravity, Urine: 1.026 (ref 1.005–1.030)
pH: 6 (ref 5.0–8.0)

## 2019-05-03 LAB — D-DIMER, QUANTITATIVE: D-Dimer, Quant: 0.7 ug/mL-FEU — ABNORMAL HIGH (ref 0.00–0.50)

## 2019-05-03 LAB — PROTIME-INR
INR: 0.9 (ref 0.8–1.2)
Prothrombin Time: 11.6 seconds (ref 11.4–15.2)

## 2019-05-03 LAB — CBC WITH DIFFERENTIAL/PLATELET
Abs Immature Granulocytes: 0.03 10*3/uL (ref 0.00–0.07)
Basophils Absolute: 0 10*3/uL (ref 0.0–0.1)
Basophils Relative: 0 %
Eosinophils Absolute: 0 10*3/uL (ref 0.0–0.5)
Eosinophils Relative: 0 %
HCT: 41.4 % (ref 39.0–52.0)
Hemoglobin: 13.3 g/dL (ref 13.0–17.0)
Immature Granulocytes: 1 %
Lymphocytes Relative: 18 %
Lymphs Abs: 0.9 10*3/uL (ref 0.7–4.0)
MCH: 30.2 pg (ref 26.0–34.0)
MCHC: 32.1 g/dL (ref 30.0–36.0)
MCV: 94.1 fL (ref 80.0–100.0)
Monocytes Absolute: 0.4 10*3/uL (ref 0.1–1.0)
Monocytes Relative: 8 %
Neutro Abs: 3.6 10*3/uL (ref 1.7–7.7)
Neutrophils Relative %: 73 %
Platelets: 199 10*3/uL (ref 150–400)
RBC: 4.4 MIL/uL (ref 4.22–5.81)
RDW: 13.2 % (ref 11.5–15.5)
WBC: 4.9 10*3/uL (ref 4.0–10.5)
nRBC: 0 % (ref 0.0–0.2)

## 2019-05-03 LAB — C-REACTIVE PROTEIN: CRP: 3.8 mg/dL — ABNORMAL HIGH (ref <1.0)

## 2019-05-03 LAB — POC SARS CORONAVIRUS 2 AG -  ED: SARS Coronavirus 2 Ag: POSITIVE — AB

## 2019-05-03 LAB — TROPONIN I (HIGH SENSITIVITY)
Troponin I (High Sensitivity): 6 ng/L (ref <18)
Troponin I (High Sensitivity): 6 ng/L (ref <18)

## 2019-05-03 LAB — TRIGLYCERIDES: Triglycerides: 199 mg/dL — ABNORMAL HIGH (ref <150)

## 2019-05-03 LAB — HEPATITIS B SURFACE ANTIGEN: Hepatitis B Surface Ag: NONREACTIVE

## 2019-05-03 LAB — CREATININE, SERUM
Creatinine, Ser: 1.23 mg/dL (ref 0.61–1.24)
GFR calc Af Amer: >60 mL/min (ref >60)
GFR calc non Af Amer: >60 mL/min (ref >60)

## 2019-05-03 LAB — ABO/RH: ABO/RH(D): A POS

## 2019-05-03 LAB — PROCALCITONIN: Procalcitonin: <0.1 ng/mL

## 2019-05-03 LAB — LACTATE DEHYDROGENASE: LDH: 437 U/L — ABNORMAL HIGH (ref 98–192)

## 2019-05-03 LAB — FERRITIN: Ferritin: 474 ng/mL — ABNORMAL HIGH (ref 24–336)

## 2019-05-03 LAB — HIV ANTIBODY (ROUTINE TESTING W REFLEX): HIV Screen 4th Generation wRfx: NONREACTIVE

## 2019-05-03 LAB — FIBRINOGEN: Fibrinogen: 627 mg/dL — ABNORMAL HIGH (ref 210–475)

## 2019-05-03 LAB — LACTIC ACID, PLASMA: Lactic Acid, Venous: 1.8 mmol/L (ref 0.5–1.9)

## 2019-05-03 LAB — BRAIN NATRIURETIC PEPTIDE: B Natriuretic Peptide: 18.7 pg/mL (ref 0.0–100.0)

## 2019-05-03 MED ORDER — BUPROPION HCL ER (XL) 150 MG PO TB24
300.0000 mg | ORAL_TABLET | Freq: Every day | ORAL | Status: DC
  Administered 2019-05-04 – 2019-05-08 (×5): 300 mg via ORAL
  Filled 2019-05-03 (×5): qty 2

## 2019-05-03 MED ORDER — GUAIFENESIN-DM 100-10 MG/5ML PO SYRP
10.0000 mL | ORAL_SOLUTION | ORAL | Status: DC | PRN
  Filled 2019-05-03: qty 10

## 2019-05-03 MED ORDER — ZINC SULFATE 220 (50 ZN) MG PO CAPS
220.0000 mg | ORAL_CAPSULE | Freq: Every day | ORAL | Status: DC
  Administered 2019-05-03 – 2019-05-08 (×6): 220 mg via ORAL
  Filled 2019-05-03 (×6): qty 1

## 2019-05-03 MED ORDER — ONDANSETRON HCL 4 MG PO TABS: 4.0000 mg | ORAL_TABLET | Freq: Four times a day (QID) | ORAL | Status: DC | PRN

## 2019-05-03 MED ORDER — ACETAMINOPHEN 325 MG PO TABS
650.0000 mg | ORAL_TABLET | Freq: Four times a day (QID) | ORAL | Status: DC | PRN
  Administered 2019-05-06: 650 mg via ORAL
  Filled 2019-05-03: qty 2

## 2019-05-03 MED ORDER — IPRATROPIUM-ALBUTEROL 0.5-2.5 (3) MG/3ML IN SOLN: 3.0000 mL | RESPIRATORY_TRACT | Status: DC | PRN

## 2019-05-03 MED ORDER — SODIUM CHLORIDE 0.9 % IV SOLN
200.0000 mg | Freq: Once | INTRAVENOUS | Status: AC
  Administered 2019-05-03: 200 mg via INTRAVENOUS
  Filled 2019-05-03: qty 40

## 2019-05-03 MED ORDER — DEXAMETHASONE SODIUM PHOSPHATE 10 MG/ML IJ SOLN
6.0000 mg | INTRAMUSCULAR | Status: DC
  Administered 2019-05-03: 18:00:00 6 mg via INTRAVENOUS
  Filled 2019-05-03: qty 1

## 2019-05-03 MED ORDER — PANTOPRAZOLE SODIUM 40 MG PO TBEC
40.0000 mg | DELAYED_RELEASE_TABLET | Freq: Every day | ORAL | Status: DC
  Administered 2019-05-04 – 2019-05-08 (×5): 40 mg via ORAL
  Filled 2019-05-03 (×5): qty 1

## 2019-05-03 MED ORDER — HYDROCODONE-ACETAMINOPHEN 10-325 MG PO TABS
0.5000 | ORAL_TABLET | Freq: Four times a day (QID) | ORAL | Status: DC | PRN
  Administered 2019-05-03 – 2019-05-04 (×2): 1 via ORAL
  Filled 2019-05-03 (×2): qty 1

## 2019-05-03 MED ORDER — NORTRIPTYLINE HCL 25 MG PO CAPS
25.0000 mg | ORAL_CAPSULE | Freq: Every day | ORAL | Status: DC
  Administered 2019-05-03 – 2019-05-04 (×2): 25 mg via ORAL
  Filled 2019-05-03 (×4): qty 1

## 2019-05-03 MED ORDER — ENOXAPARIN SODIUM 40 MG/0.4ML ~~LOC~~ SOLN
40.0000 mg | SUBCUTANEOUS | Status: DC
  Administered 2019-05-03: 40 mg via SUBCUTANEOUS
  Filled 2019-05-03: qty 0.4

## 2019-05-03 MED ORDER — SODIUM CHLORIDE 0.9 % IV SOLN
100.0000 mg | Freq: Every day | INTRAVENOUS | Status: AC
  Administered 2019-05-04 – 2019-05-07 (×4): 100 mg via INTRAVENOUS
  Filled 2019-05-03 (×4): qty 20

## 2019-05-03 MED ORDER — ONDANSETRON HCL 4 MG/2ML IJ SOLN
4.0000 mg | Freq: Four times a day (QID) | INTRAMUSCULAR | Status: DC | PRN
  Filled 2019-05-03: qty 2

## 2019-05-03 MED ORDER — HYDROCOD POLST-CPM POLST ER 10-8 MG/5ML PO SUER: 5.0000 mL | Freq: Two times a day (BID) | ORAL | Status: DC | PRN

## 2019-05-03 MED ORDER — CLONAZEPAM 0.5 MG PO TABS
0.5000 mg | ORAL_TABLET | Freq: Two times a day (BID) | ORAL | Status: DC | PRN
  Administered 2019-05-03 – 2019-05-06 (×5): 0.5 mg via ORAL
  Filled 2019-05-03 (×5): qty 1

## 2019-05-03 MED ORDER — ASCORBIC ACID 500 MG PO TABS
500.0000 mg | ORAL_TABLET | Freq: Every day | ORAL | Status: DC
  Administered 2019-05-03 – 2019-05-08 (×6): 500 mg via ORAL
  Filled 2019-05-03 (×6): qty 1

## 2019-05-03 MED ORDER — ASPIRIN EC 81 MG PO TBEC
81.0000 mg | DELAYED_RELEASE_TABLET | Freq: Every day | ORAL | Status: DC
  Administered 2019-05-04 – 2019-05-08 (×5): 81 mg via ORAL
  Filled 2019-05-03 (×5): qty 1

## 2019-05-03 MED ORDER — CITALOPRAM HYDROBROMIDE 40 MG PO TABS
40.0000 mg | ORAL_TABLET | Freq: Every day | ORAL | Status: DC
  Administered 2019-05-03 – 2019-05-07 (×5): 40 mg via ORAL
  Filled 2019-05-03 (×5): qty 1

## 2019-05-03 MED ORDER — ATORVASTATIN CALCIUM 80 MG PO TABS
80.0000 mg | ORAL_TABLET | Freq: Every day | ORAL | Status: DC
  Administered 2019-05-04 – 2019-05-08 (×5): 80 mg via ORAL
  Filled 2019-05-03 (×5): qty 1

## 2019-05-03 NOTE — Progress Notes (Signed)
Subjective:    Patient ID: Matthew Eaton, male    DOB: 13-Oct-1960, 59 y.o.   MRN: [id number]  HPI Here with wife due to shortness of breath This visit occurred during the SARS-CoV-2 public health emergency.  Safety protocols were in place, including screening questions prior to the visit, additional usage of staff PPE, and extensive cleaning of exam room while observing appropriate contact time as indicated for disinfecting solutions.   Started with issues since surgery on neck 2009 He feels like he can't get any air when he bends his neck forward No problems just sitting around  Now worse in the past week Slid out of bed and hit tailbone---having worse pain there Lightheaded feelings for 5-6 days More pain in ankles and feet as well Pain up to hips now as well Chronic ongoing neck pain--to headache   No chest pain No palpitations  Current Outpatient Medications on File Prior to Visit  Medication Sig Dispense Refill  . aspirin 81 MG tablet Take 81 mg by mouth daily.      Marland Kitchen atorvastatin (LIPITOR) 80 MG tablet TAKE 1 TABLET BY MOUTH DAILY AT 6 PM. 90 tablet 3  . buPROPion (WELLBUTRIN XL) 300 MG 24 hr tablet TAKE 1 TABLET BY MOUTH DAILY. 30 tablet 5  . cholecalciferol (VITAMIN D) 1000 units tablet Take 2,000 Units by mouth daily.    . citalopram (CELEXA) 40 MG tablet TAKE 1 TABLET BY MOUTH DAILY. 30 tablet 5  . clonazePAM (KLONOPIN) 0.5 MG tablet TAKE 1 TABLET BY MOUTH TWICE A DAY AS NEEDED 60 tablet 3  . HYDROcodone-acetaminophen (NORCO) 10-325 MG tablet TAKE 1 TABLET BY MOUTH EVERY 6 HOURS AS NEEDED FOR SEVERE PAIN. 120 tablet 0  . HYDROcodone-acetaminophen (NORCO) 10-325 MG tablet TAKE 1 TABLET BY MOUTH EVERY 6 HOURS AS NEEDED FOR SEVERE PAIN 120 tablet 0  . HYDROcodone-acetaminophen (NORCO) 10-325 MG tablet Take 1 tablet by mouth every 6 (six) hours as needed for severe pain. 120 tablet 0  . nortriptyline (PAMELOR) 25 MG capsule Take 1 capsule (25 mg total) by mouth at  bedtime. 30 capsule 2  . omeprazole (PRILOSEC) 20 MG capsule TAKE 1 CAPSULE BY MOUTH 2 TIMES DAILY BEFORE A MEAL. 180 capsule 3   No current facility-administered medications on file prior to visit.    Allergies  Allergen Reactions  . Methocarbamol Shortness Of Breath  . Gabapentin     Past Medical History:  Diagnosis Date  . Chronic neck pain 05/20/2012  . Family hx of ALS (amyotrophic lateral sclerosis) 05/27/2013  . GERD (gastroesophageal reflux disease)   . Hiatal hernia   . HLD (hyperlipidemia)   . Major depressive disorder, recurrent episode, moderate ([place]) 05/27/2013  . Neck pain    cervical spine with herniation  . Tobacco abuse   . Wrist fracture, left     Past Surgical History:  Procedure Laterality Date  . NECK SURGERY     x2- 2010, 2011    Family History  Problem Relation Age of Onset  . ALS Mother   . ALS Brother   . ALS Maternal Uncle   . ALS Maternal Aunt   . Colon cancer Neg Hx   . Esophageal cancer Neg Hx   . Rectal cancer Neg Hx   . Stomach cancer Neg Hx     Social History   Socioeconomic History  . Marital status: Married    Spouse name: Not on file  . Number of children: 5  . Years  of education: 95  . Highest education level: Not on file  Occupational History  . Not on file  Tobacco Use  . Smoking status: Former Smoker    Years: 30.00    Quit date: 02/03/2007    Years since quitting: 12.2  . Smokeless tobacco: Never Used  Substance and Sexual Activity  . Alcohol use: No  . Drug use: No  . Sexual activity: Yes    Partners: Female  Other Topics Concern  . Not on file  Social History Narrative   Lives home with wife, Joelene Millin.  Education 12th grade.  Five children.   Social Determinants of Health   Financial Resource Strain:   . Difficulty of Paying Living Expenses:   Food Insecurity:   . Worried About Charity fundraiser in the Last Year:   . Arboriculturist in the Last Year:   Transportation Needs:   . Film/video editor  (Medical):   Marland Kitchen Lack of Transportation (Non-Medical):   Physical Activity:   . Days of Exercise per Week:   . Minutes of Exercise per Session:   Stress:   . Feeling of Stress :   Social Connections:   . Frequency of Communication with Friends and Family:   . Frequency of Social Gatherings with Friends and Family:   . Attends Religious Services:   . Active Member of Clubs or Organizations:   . Attends Archivist Meetings:   Marland Kitchen Marital Status:   Intimate Partner Violence:   . Fear of Current or Ex-Partner:   . Emotionally Abused:   Marland Kitchen Physically Abused:   . Sexually Abused:    Review of Systems No fever No wheezing Quit smoking 2008 Some edema    Objective:   Physical Exam  Constitutional:  Clearly dyspneic with mild to moderate distress (despite oxygen)  Neck: No thyromegaly present.  Cardiovascular: Normal rate and regular rhythm. Exam reveals no gallop.  No murmur heard. Respiratory:  Decreased breath sounds, especially at bases. No clear dullness to percussion No crackles or wheezes  Musculoskeletal:     Comments: No calf swelling or edema  Lymphadenopathy:    He has no cervical adenopathy.  Skin:  Very pasty---especially in face  Psychiatric: He has a normal mood and affect. His behavior is normal.           Assessment & Plan:

## 2019-05-03 NOTE — H&P (Signed)
History and Physical    Bolton Householder [id number] DOB: May 23, 1960 DOA: 04/16/2019  PCP: Owens Loffler, MD  Patient coming from: Home  I have personally briefly reviewed patient's old medical records in [place]  Chief Complaint: Shortness of breath and hypoxia  HPI: Matthew Eaton is a 59 y.o. male with medical history significant of hyperlipidemia, chronic pain syndrome, former smoker, GERD, depression presents to emergency department due to shortness of breath with exertion since 2 days.  He went to see his PCP and was noted to have hypoxia in 85% on room air.  EMS was called-patient placed on 4 L of oxygen via nasal cannula and brought patient to the emergency department for further evaluation and management.  Patient tells me that he has worsening shortness of breath since 2 days especially with exertion.  Denies association with chest pain, leg swelling, palpitation, orthopnea, PND, cough, wheezing, congestion, fever, chills, recent travel, COVID-19 exposure, abdominal pain, nausea, vomiting, diarrhea, decreased appetite, weakness or lethargy.  He lives with his wife and denies smoking, alcohol, illicit drug use.  ED Course: Upon arrival to ED: Patient's oxygen saturation was 85%-he placed on 4 L of oxygen via nasal cannula, patient is tachypneic, afebrile with no leukocytosis.  CMP shows elevated liver enzymes.  Initial labs such as lactic acid, troponin, BNP, INR, UA: Negative.  POC COVID-19 positive.  Chest x-ray shows multifocal pneumonia.  [facility] hospital[facility] consulted for admission for acute hypoxemic respiratory failure due to COVID-19 pneumonia.  Review of Systems: As per HPI otherwise negative.    Past Medical History:  Diagnosis Date  . Chronic neck pain 05/20/2012  . Family hx of ALS (amyotrophic lateral sclerosis) 05/27/2013  . GERD (gastroesophageal reflux disease)   . Hiatal hernia   . HLD (hyperlipidemia)   . Major depressive disorder, recurrent  episode, moderate ([place]) 05/27/2013  . Neck pain    cervical spine with herniation  . Tobacco abuse   . Wrist fracture, left     Past Surgical History:  Procedure Laterality Date  . NECK SURGERY     x2- 2010, 2011     reports that he quit smoking about 12 years ago. He quit after 30.00 years of use. He has never used smokeless tobacco. He reports that he does not drink alcohol or use drugs.  Allergies  Allergen Reactions  . Methocarbamol Shortness Of Breath  . Gabapentin     Family History  Problem Relation Age of Onset  . ALS Mother   . ALS Brother   . ALS Maternal Uncle   . ALS Maternal Aunt   . Colon cancer Neg Hx   . Esophageal cancer Neg Hx   . Rectal cancer Neg Hx   . Stomach cancer Neg Hx     Prior to Admission medications   Medication Sig Start Date End Date Taking? Authorizing Provider  aspirin 81 MG tablet Take 81 mg by mouth daily.      [provider]  atorvastatin (LIPITOR) 80 MG tablet TAKE 1 TABLET BY MOUTH DAILY AT 6 PM. 09/12/18   Copland, Frederico Hamman, MD  buPROPion (WELLBUTRIN XL) 300 MG 24 hr tablet TAKE 1 TABLET BY MOUTH DAILY. 01/24/19   Copland, Frederico Hamman, MD  cholecalciferol (VITAMIN D) 1000 units tablet Take 2,000 Units by mouth daily.    [provider]  citalopram (CELEXA) 40 MG tablet TAKE 1 TABLET BY MOUTH DAILY. 01/09/19   Copland, Frederico Hamman, MD  clonazePAM (KLONOPIN) 0.5 MG tablet TAKE 1 TABLET  BY MOUTH TWICE A DAY AS NEEDED 11/28/18   Copland, Frederico Hamman, MD  HYDROcodone-acetaminophen (NORCO) 10-325 MG tablet TAKE 1 TABLET BY MOUTH EVERY 6 HOURS AS NEEDED FOR SEVERE PAIN. 04/03/19   Copland, Frederico Hamman, MD  HYDROcodone-acetaminophen (NORCO) 10-325 MG tablet TAKE 1 TABLET BY MOUTH EVERY 6 HOURS AS NEEDED FOR SEVERE PAIN 04/03/19   Copland, Frederico Hamman, MD  HYDROcodone-acetaminophen (NORCO) 10-325 MG tablet Take 1 tablet by mouth every 6 (six) hours as needed for severe pain. 04/03/19   Copland, Frederico Hamman, MD  nortriptyline (PAMELOR) 25 MG capsule Take 1  capsule (25 mg total) by mouth at bedtime. 05/04/18   Copland, Frederico Hamman, MD  omeprazole (PRILOSEC) 20 MG capsule TAKE 1 CAPSULE BY MOUTH 2 TIMES DAILY BEFORE A MEAL. 09/16/18   Owens Loffler, MD    Physical Exam: Vitals:   04/17/2019 1416 04/30/2019 1613 04/28/2019 1620 04/21/2019 1700  BP: 119/85 136/85 130/89 136/82  Pulse: 87 78 80 80  Resp: 18  (!) 24 (!) 24  Temp:  98.2 F (36.8 C)    TempSrc:  Oral    SpO2: 95% 95% 90% 91%  Weight:      Height:        Constitutional: NAD, calm, comfortable, communicating well, on 4 L of oxygen via nasal cannula Eyes: PERRL, lids and conjunctivae normal ENMT: Mucous membranes are moist. Posterior pharynx clear of any exudate or lesions.Normal dentition.  Neck: normal, supple, no masses, no thyromegaly Respiratory: clear to auscultation bilaterally, no wheezing, no crackles. Normal respiratory effort. No accessory muscle use.  Cardiovascular: Regular rate and rhythm, no murmurs / rubs / gallops. No extremity edema. 2+ pedal pulses. No carotid bruits.  Abdomen: no tenderness, no masses palpated. No hepatosplenomegaly. Bowel sounds positive.  Musculoskeletal: no clubbing / cyanosis. No joint deformity upper and lower extremities. Good ROM, no contractures. Normal muscle tone.  Skin: no rashes, lesions, ulcers. No induration Neurologic: CN 2-12 grossly intact. Sensation intact, DTR normal. Strength 5/5 in all 4.  Psychiatric: Normal judgment and insight. Alert and oriented x 3. Normal mood.    Labs on Admission: I have personally reviewed following labs and imaging studies  CBC: Recent Labs  Lab 05/02/2019 1304  WBC 4.9  NEUTROABS 3.6  HGB 13.3  HCT 41.4  MCV 94.1  PLT 123XX123   Basic Metabolic Panel: Recent Labs  Lab 04/17/2019 1304  NA 139  K 3.8  CL 104  CO2 23  GLUCOSE 108*  BUN 15  CREATININE 1.24  CALCIUM 8.7*   GFR: Estimated Creatinine Clearance: 74.4 mL/min (by C-G formula based on SCr of 1.24 mg/dL). Liver Function Tests: Recent  Labs  Lab 04/26/2019 1304  AST 72*  ALT 59*  ALKPHOS 87  BILITOT 0.5  PROT 6.6  ALBUMIN 3.1*   No results for input(s): LIPASE, AMYLASE in the last 168 hours. No results for input(s): AMMONIA in the last 168 hours. Coagulation Profile: Recent Labs  Lab 04/11/2019 1614  INR 0.9   Cardiac Enzymes: No results for input(s): CKTOTAL, CKMB, CKMBINDEX, TROPONINI in the last 168 hours. BNP (last 3 results) No results for input(s): PROBNP in the last 8760 hours. HbA1C: No results for input(s): HGBA1C in the last 72 hours. CBG: No results for input(s): GLUCAP in the last 168 hours. Lipid Profile: No results for input(s): CHOL, HDL, LDLCALC, TRIG, CHOLHDL, LDLDIRECT in the last 72 hours. Thyroid Function Tests: No results for input(s): TSH, T4TOTAL, FREET4, T3FREE, THYROIDAB in the last 72 hours. Anemia Panel: No results for  input(s): VITAMINB12, FOLATE, FERRITIN, TIBC, IRON, RETICCTPCT in the last 72 hours. Urine analysis:    Component Value Date/Time   COLORURINE YELLOW 04/05/2019 [place] 05/01/2019 1633   LABSPEC 1.026 04/25/2019 1633   PHURINE 6.0 05/01/2019 1633   GLUCOSEU NEGATIVE 04/24/2019 1633   HGBUR NEGATIVE 04/18/2019 1633   BILIRUBINUR NEGATIVE 04/30/2019 [place] NEGATIVE 04/07/2019 1633   PROTEINUR 30 (A) 04/03/2019 1633   NITRITE NEGATIVE 04/05/2019 1633   LEUKOCYTESUR NEGATIVE 05/02/2019 1633    Radiological Exams on Admission: DG Chest 2 View  Result Date: 04/17/2019 CLINICAL DATA:  Shortness of breath EXAM: CHEST - 2 VIEW COMPARISON:  March 03, 2009 FINDINGS: There is ill-defined airspace consolidation in the left mid lung and left lower lung regions. There is also subtle opacity in the right perihilar region. There is mild scarring in the right base. Heart size and pulmonary vascularity are normal. No adenopathy. Postoperative change noted in lower cervical region. IMPRESSION: Ill-defined airspace opacity left mid lung and left lower  lung regions as well as more subtly in the right perihilar region. Appearance consistent with multifocal pneumonia. Mild atelectasis right base. Heart size normal.  No evident adenopathy. Electronically Signed   By: Lowella Grip III M.D.   On: 05/01/2019 13:25    EKG: Independently reviewed. Sinus rhythm, Prolonged QT interval.  No ST elevation or depression noted.  Assessment/Plan Principal Problem:   Acute hypoxemic respiratory failure due to COVID-19 [facility]) Active Problems:   Hyperlipidemia   GERD   Major depressive disorder, recurrent episode, moderate (HCC)   Multifocal pneumonia   Elevated liver enzymes    Acute hypoxemic respiratory failure due to COVID-19 pneumonia: -Patient presented with worsening shortness of breath and hypoxia.  He is on nonrebreather mask.  POC COVID-19 positive.  Reviewed chest x-ray. -Admit patient to stepdown unit for close monitoring. -Start on Decadron 6 mg IV daily, consulted pharmacy for remdesivir. -Inflammatory markers are pending. -On continuous pulse ox-try to wean off of oxygen as tolerated. -Patient was told that if COVID-19 pneumonitis gets worse we might potentially use Actemra off label, he denies any known history of tuberculosis or hepatitis, understands the risk and benefits and wants to proceed with Actemra treatment if required. -Start on p.o. vitamins, DuoNebs as needed.  Elevated liver enzymes: -Likely secondary to COVID-19 infection. -We will get right upper quadrant ultrasound.  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  Plan of care discussed with patient in length and he verbalized understanding and agreed with it. Disposition Plan: To be  determined Consults called: None Admission status: Inpatient   Mckinley Jewel MD [facility] Pager [phone]  If 7PM-7AM, please contact night-coverage [email or web address] Password [facility]  04/07/2019, 5:56 PM

## 2019-05-03 NOTE — ED Provider Notes (Signed)
[place] EMERGENCY DEPARTMENT Provider Note   CSN: [id number] Arrival date & time: 04/10/2019  1258     History Chief Complaint  Patient presents with  . Shortness of Breath    Matthew Eaton is a 59 y.o. male.  Patient with history of previous neck surgery with chronic pain, high cholesterol, previous smoker --presents the emergency department with increasing dyspnea and hypoxia.  He was sent in today from his primary care doctor's office, Dr. Silvio Pate.  Patient reports having approximately 4 to 5 days of increasing shortness of breath.  He states that when he gets up and walks around his house he becomes dizzy and starts breathing very heavy.  He denies any fevers or cough.  He has had some nasal congestion but no sore throat.  No change in taste or smell and no Covid contacts.  He denies any swelling in his legs or abdomen.  No abdominal pain, urinary symptoms.  He states that he has been drinking a usual amount of water.  At PCP office today, patient was 85% on room air, and was transported to the emergency department by EMS.  Patient is currently on 4 L nasal cannula.  No history of heart failure, COPD, coronary artery disease.  Patient denies risk factors for pulmonary embolism including: unilateral leg swelling, history of DVT/PE/other blood clots, use of exogenous hormones, recent immobilizations, recent surgery, recent travel (>4hr segment), malignancy, hemoptysis.          Past Medical History:  Diagnosis Date  . Chronic neck pain 05/20/2012  . Family hx of ALS (amyotrophic lateral sclerosis) 05/27/2013  . GERD (gastroesophageal reflux disease)   . Hiatal hernia   . HLD (hyperlipidemia)   . Major depressive disorder, recurrent episode, moderate ([place]) 05/27/2013  . Neck pain    cervical spine with herniation  . Tobacco abuse   . Wrist fracture, left     Patient Active Problem List   Diagnosis Date Noted  . Acute hypoxemic respiratory failure ([place])  04/11/2019  . Chronic, continuous use of opioids 12/24/2017  . Diabetes mellitus type 2, uncomplicated ([place]) 99991111  . 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 Chronic bilateral low back pain without sciatica 06/04/2016  . Vitamin B12 deficiency 10/15/2015  . Family hx of ALS (amyotrophic lateral sclerosis) 05/27/2013  . Major depressive disorder, recurrent episode, moderate ([place]) 05/27/2013  . Obesity (BMI 30-39.9) 05/22/2013  . Insomnia 05/20/2012  . Hiatal hernia 01/05/2011  . [place] DISEASE, CERVICAL 01/22/2010  . TOBACCO ABUSE, HX OF 01/22/2010  . Generalized anxiety disorder 03/06/2009  . Hypogonadism in male 12/19/2008  . Hyperlipidemia 11/15/2008  . GERD 11/15/2008    Past Surgical History:  Procedure Laterality Date  . NECK SURGERY     x2- 2010, 2011       Family History  Problem Relation Age of Onset  . ALS Mother   . ALS Brother   . ALS Maternal Uncle   . ALS Maternal Aunt   . Colon cancer Neg Hx   . Esophageal cancer Neg Hx   . Rectal cancer Neg Hx   . Stomach cancer Neg Hx     Social History   Tobacco Use  . Smoking status: Former Smoker    Years: 30.00    Quit date: 02/03/2007    Years since quitting: 12.2  . Smokeless tobacco: Never Used  Substance Use Topics  . Alcohol use: No  . Drug use: No    Home Medications Prior to  Admission medications   Medication Sig Start Date End Date Taking? Authorizing Provider  aspirin 81 MG tablet Take 81 mg by mouth daily.      [provider]  atorvastatin (LIPITOR) 80 MG tablet TAKE 1 TABLET BY MOUTH DAILY AT 6 PM. 09/12/18   Copland, Frederico Hamman, MD  buPROPion (WELLBUTRIN XL) 300 MG 24 hr tablet TAKE 1 TABLET BY MOUTH DAILY. 01/24/19   Copland, Frederico Hamman, MD  cholecalciferol (VITAMIN D) 1000 units tablet Take 2,000 Units by mouth daily.    [provider]  citalopram (CELEXA) 40 MG tablet TAKE 1 TABLET BY MOUTH DAILY. 01/09/19   Copland, Frederico Hamman, MD  clonazePAM (KLONOPIN) 0.5 MG tablet TAKE 1 TABLET BY  MOUTH TWICE A DAY AS NEEDED 11/28/18   Copland, Frederico Hamman, MD  HYDROcodone-acetaminophen (NORCO) 10-325 MG tablet TAKE 1 TABLET BY MOUTH EVERY 6 HOURS AS NEEDED FOR SEVERE PAIN. 04/03/19   Copland, Frederico Hamman, MD  HYDROcodone-acetaminophen (NORCO) 10-325 MG tablet TAKE 1 TABLET BY MOUTH EVERY 6 HOURS AS NEEDED FOR SEVERE PAIN 04/03/19   Copland, Frederico Hamman, MD  HYDROcodone-acetaminophen (NORCO) 10-325 MG tablet Take 1 tablet by mouth every 6 (six) hours as needed for severe pain. 04/03/19   Copland, Frederico Hamman, MD  nortriptyline (PAMELOR) 25 MG capsule Take 1 capsule (25 mg total) by mouth at bedtime. 05/04/18   Copland, Frederico Hamman, MD  omeprazole (PRILOSEC) 20 MG capsule TAKE 1 CAPSULE BY MOUTH 2 TIMES DAILY BEFORE A MEAL. 09/16/18   Copland, Frederico Hamman, MD    Allergies    Methocarbamol and Gabapentin  Review of Systems   Review of Systems  Constitutional: Negative for diaphoresis and fever.  HENT: Positive for congestion.   Eyes: Negative for redness.  Respiratory: Positive for shortness of breath. Negative for cough and wheezing.   Cardiovascular: Negative for chest pain, palpitations and leg swelling.  Gastrointestinal: Negative for abdominal pain, nausea and vomiting.  Genitourinary: Negative for dysuria.  Musculoskeletal: Negative for back pain and neck pain.  Skin: Negative for rash.  Neurological: Positive for weakness (generalized) and light-headedness. Negative for syncope.  Psychiatric/Behavioral: The patient is not nervous/anxious.     Physical Exam Updated Vital Signs BP 130/89   Pulse 80   Temp 98.2 F (36.8 C) (Oral)   Resp (!) 24   Ht 5\' 8"  (1.727 m)   Wt 99.8 kg   SpO2 90%   BMI 33.45 kg/m   Physical Exam Vitals and nursing note reviewed.  Constitutional:      Appearance: He is well-developed.  HENT:     Head: Normocephalic and atraumatic.     Mouth/Throat:     Mouth: Mucous membranes are moist.  Eyes:     General:        Right eye: No discharge.        Left eye: No  discharge.     Conjunctiva/sclera: Conjunctivae normal.  Cardiovascular:     Rate and Rhythm: Normal rate and regular rhythm.     Heart sounds: Normal heart sounds.  Pulmonary:     Effort: Pulmonary effort is normal. Tachypnea present.     Breath sounds: Normal breath sounds. No decreased breath sounds, wheezing, rhonchi or rales.     Comments: Mild tachypnea when he is talking.  Breathing is a bit heavy. Abdominal:     Palpations: Abdomen is soft.     Tenderness: There is no abdominal tenderness.  Musculoskeletal:     Cervical back: Normal range of motion and neck supple.     Right  lower leg: No tenderness. No edema.     Left lower leg: No tenderness. No edema.  Skin:    General: Skin is warm and dry.  Neurological:     General: No focal deficit present.     Mental Status: He is alert and oriented to person, place, and time.     ED Results / Procedures / Treatments   Labs (all labs ordered are listed, but only abnormal results are displayed) Labs Reviewed  COMPREHENSIVE METABOLIC PANEL - Abnormal; Notable for the following components:      Result Value   Glucose, Bld 108 (*)    Calcium 8.7 (*)    Albumin 3.1 (*)    AST 72 (*)    ALT 59 (*)    All other components within normal limits  URINALYSIS, ROUTINE W REFLEX MICROSCOPIC - Abnormal; Notable for the following components:   Protein, ur 30 (*)    Bacteria, UA RARE (*)    All other components within normal limits  POC SARS CORONAVIRUS 2 AG -  ED - Abnormal; Notable for the following components:   SARS Coronavirus 2 Ag POSITIVE (*)    All other components within normal limits  CULTURE, BLOOD (ROUTINE X 2)  CULTURE, BLOOD (ROUTINE X 2)  CBC WITH DIFFERENTIAL/PLATELET  LACTIC ACID, PLASMA  PROTIME-INR  BRAIN NATRIURETIC PEPTIDE  D-DIMER, QUANTITATIVE (NOT AT [facility])  PROCALCITONIN  LACTATE DEHYDROGENASE  FERRITIN  FIBRINOGEN  C-REACTIVE PROTEIN  TRIGLYCERIDES  TROPONIN I (HIGH SENSITIVITY)    EKG EKG  Interpretation  Date/Time:  Wednesday May 03 2019 16:19:46 EDT Ventricular Rate:  80 PR Interval:  134 QRS Duration: 109 QT Interval:  491 QTC Calculation: 567 R Axis:   15 Text Interpretation: Sinus rhythm Consider left atrial enlargement Abnormal R-wave progression, early transition Prolonged QT interval Confirmed by Quintella Reichert [phone]) on 04/16/2019 4:33:14 PM   Radiology DG Chest 2 View  Result Date: 04/07/2019 CLINICAL DATA:  Shortness of breath EXAM: CHEST - 2 VIEW COMPARISON:  March 03, 2009 FINDINGS: There is ill-defined airspace consolidation in the left mid lung and left lower lung regions. There is also subtle opacity in the right perihilar region. There is mild scarring in the right base. Heart size and pulmonary vascularity are normal. No adenopathy. Postoperative change noted in lower cervical region. IMPRESSION: Ill-defined airspace opacity left mid lung and left lower lung regions as well as more subtly in the right perihilar region. Appearance consistent with multifocal pneumonia. Mild atelectasis right base. Heart size normal.  No evident adenopathy. Electronically Signed   By: Lowella Grip III M.D.   On: 04/12/2019 13:25    Procedures Procedures (including critical care time)  Medications Ordered in ED Medications - No data to display  ED Course  I have reviewed the triage vital signs and the nursing notes.  Pertinent labs & imaging results that were available during my care of the patient were reviewed by me and considered in my medical decision making (see chart for details).  Patient seen and examined.  EKG reviewed.  Chest x-ray with multifocal pneumonia.  Clinically, the patient does not have signs and symptoms suggestive of pneumonia.  He denies cough and fever, normal white blood cell count.  Will send rapid Covid testing, obtain CT of the chest.  Pending lactic acid, culture sent.  If CT is consistent with pneumonia, will need antibiotics.  Vital  signs reviewed and are as follows: BP 130/89   Pulse 80   Temp 98.2  F (36.8 C) (Oral)   Resp (!) 24   Ht 5\' 8"  (1.727 m)   Wt 99.8 kg   SpO2 90%   BMI 33.45 kg/m     EMERGENCY DEPARTMENT [place] CARDIAC EXAM "Study: Limited Ultrasound of the Heart and Pericardium"  INDICATIONS:Dyspnea Multiple views of the heart and pericardium were obtained in real-time with a multi-frequency probe.  PERFORMED YE:[id number] IMAGES ARCHIVED?: Yes LIMITATIONS:  Body habitus VIEWS USED: Parasternal long axis, Parasternal short axis and Apical 4 chamber  INTERPRETATION: Cardiac activity present, Pericardial effusioin absent, Cardiac tamponade absent, Normal contractility and unable to obtain subcostal view 2/2 habitus  5:20 PM COVID positive. Pt updated. CT cancelled given diagnosis.   5:39 PM Discussed with Dr. Doristine Bosworth who will see. Trop, BNP reviewed.   CRITICAL CARE Performed by: Carlisle Cater PA-C Total critical care time: 35 minutes Critical care time was exclusive of separately billable procedures and treating other patients. Critical care was necessary to treat or prevent imminent or life-threatening deterioration. Critical care was time spent personally by me on the following activities: development of treatment plan with patient and/or surrogate as well as nursing, discussions with consultants, evaluation of patient's response to treatment, examination of patient, obtaining history from patient or surrogate, ordering and performing treatments and interventions, ordering and review of laboratory studies, ordering and review of radiographic studies, pulse oximetry and re-evaluation of patient's condition.    MDM Rules/Calculators/A&P                      Admit.   Final Clinical Impression(s) / ED Diagnoses Final diagnoses:  Pneumonia due to COVID-19 virus  Acute respiratory failure with hypoxia [facility])    Rx / DC Orders ED Discharge Orders    None       Carlisle Cater, Hershal Coria 04/08/2019  1740    Quintella Reichert, MD 05/05/19 1019

## 2019-05-03 NOTE — Assessment & Plan Note (Signed)
Chronic dyspnea but now worse in the past few days Clearly in distress now--despite starting oxygen Symptoms are not suggestive of infection--but that is still a possibility I am concerned about PE, acute (silent) coronary ischemia, CHF or primary pulmonary etiology Discussed my concerns with him and wife--and they agree to EMS transfer to [facility] ER for further evaluation

## 2019-05-03 NOTE — Addendum Note (Signed)
Addended by: Viviana Simpler I on: 04/03/2019 12:27 PM   Modules accepted: Level of Service

## 2019-05-03 NOTE — ED Triage Notes (Signed)
Pt BIB [facility] from PCP for eval of worsening SOB x 2 days, worse w/exertion. Pt was 85% on RA at PCP, prompting them to call EMS for transport. EMS reports 93-94% w/ them on 4LPM. NARD in triage, denies CP.

## 2019-05-04 DIAGNOSIS — Z8659 Personal history of other mental and behavioral disorders: Secondary | ICD-10-CM

## 2019-05-04 DIAGNOSIS — Z87891 Personal history of nicotine dependence: Secondary | ICD-10-CM

## 2019-05-04 DIAGNOSIS — E785 Hyperlipidemia, unspecified: Secondary | ICD-10-CM

## 2019-05-04 DIAGNOSIS — G8929 Other chronic pain: Secondary | ICD-10-CM

## 2019-05-04 DIAGNOSIS — J1282 Pneumonia due to coronavirus disease 2019: Secondary | ICD-10-CM

## 2019-05-04 DIAGNOSIS — Z79891 Long term (current) use of opiate analgesic: Secondary | ICD-10-CM

## 2019-05-04 DIAGNOSIS — M542 Cervicalgia: Secondary | ICD-10-CM

## 2019-05-04 LAB — BRAIN NATRIURETIC PEPTIDE: B Natriuretic Peptide: 11.2 pg/mL (ref 0.0–100.0)

## 2019-05-04 LAB — CBC WITH DIFFERENTIAL/PLATELET
Abs Immature Granulocytes: 0.04 10*3/uL (ref 0.00–0.07)
Basophils Absolute: 0 10*3/uL (ref 0.0–0.1)
Basophils Relative: 0 %
Eosinophils Absolute: 0 10*3/uL (ref 0.0–0.5)
Eosinophils Relative: 0 %
HCT: 38.2 % — ABNORMAL LOW (ref 39.0–52.0)
Hemoglobin: 12.4 g/dL — ABNORMAL LOW (ref 13.0–17.0)
Immature Granulocytes: 1 %
Lymphocytes Relative: 19 %
Lymphs Abs: 0.7 10*3/uL (ref 0.7–4.0)
MCH: 30 pg (ref 26.0–34.0)
MCHC: 32.5 g/dL (ref 30.0–36.0)
MCV: 92.3 fL (ref 80.0–100.0)
Monocytes Absolute: 0.3 10*3/uL (ref 0.1–1.0)
Monocytes Relative: 9 %
Neutro Abs: 2.5 10*3/uL (ref 1.7–7.7)
Neutrophils Relative %: 71 %
Platelets: 209 10*3/uL (ref 150–400)
RBC: 4.14 MIL/uL — ABNORMAL LOW (ref 4.22–5.81)
RDW: 13.2 % (ref 11.5–15.5)
WBC: 3.6 10*3/uL — ABNORMAL LOW (ref 4.0–10.5)
nRBC: 0 % (ref 0.0–0.2)

## 2019-05-04 LAB — COMPREHENSIVE METABOLIC PANEL
ALT: 57 U/L — ABNORMAL HIGH (ref 0–44)
AST: 68 U/L — ABNORMAL HIGH (ref 15–41)
Albumin: 2.7 g/dL — ABNORMAL LOW (ref 3.5–5.0)
Alkaline Phosphatase: 79 U/L (ref 38–126)
Anion gap: 14 (ref 5–15)
BUN: 16 mg/dL (ref 6–20)
CO2: 23 mmol/L (ref 22–32)
Calcium: 8.6 mg/dL — ABNORMAL LOW (ref 8.9–10.3)
Chloride: 104 mmol/L (ref 98–111)
Creatinine, Ser: 1.09 mg/dL (ref 0.61–1.24)
GFR calc Af Amer: >60 mL/min (ref >60)
GFR calc non Af Amer: >60 mL/min (ref >60)
Glucose, Bld: 132 mg/dL — ABNORMAL HIGH (ref 70–99)
Potassium: 3.8 mmol/L (ref 3.5–5.1)
Sodium: 141 mmol/L (ref 135–145)
Total Bilirubin: 0.5 mg/dL (ref 0.3–1.2)
Total Protein: 6 g/dL — ABNORMAL LOW (ref 6.5–8.1)

## 2019-05-04 LAB — D-DIMER, QUANTITATIVE: D-Dimer, Quant: 0.61 ug/mL-FEU — ABNORMAL HIGH (ref 0.00–0.50)

## 2019-05-04 LAB — PHOSPHORUS: Phosphorus: 3.4 mg/dL (ref 2.5–4.6)

## 2019-05-04 LAB — C-REACTIVE PROTEIN: CRP: 4.8 mg/dL — ABNORMAL HIGH (ref <1.0)

## 2019-05-04 LAB — MAGNESIUM: Magnesium: 2.3 mg/dL (ref 1.7–2.4)

## 2019-05-04 LAB — PROCALCITONIN: Procalcitonin: <0.1 ng/mL

## 2019-05-04 LAB — FERRITIN: Ferritin: 562 ng/mL — ABNORMAL HIGH (ref 24–336)

## 2019-05-04 MED ORDER — METHYLPREDNISOLONE SODIUM SUCC 125 MG IJ SOLR
60.0000 mg | Freq: Two times a day (BID) | INTRAMUSCULAR | Status: DC
  Administered 2019-05-04 – 2019-05-08 (×8): 60 mg via INTRAVENOUS
  Filled 2019-05-04 (×8): qty 2

## 2019-05-04 MED ORDER — HYDROCODONE-ACETAMINOPHEN 10-325 MG PO TABS
1.0000 | ORAL_TABLET | Freq: Four times a day (QID) | ORAL | Status: DC | PRN
  Administered 2019-05-04 – 2019-05-07 (×6): 1 via ORAL
  Filled 2019-05-04 (×6): qty 1

## 2019-05-04 MED ORDER — TOCILIZUMAB 400 MG/20ML IV SOLN
800.0000 mg | Freq: Once | INTRAVENOUS | Status: AC
  Administered 2019-05-04: 800 mg via INTRAVENOUS
  Filled 2019-05-04: qty 40

## 2019-05-04 MED ORDER — ENOXAPARIN SODIUM 60 MG/0.6ML ~~LOC~~ SOLN
50.0000 mg | SUBCUTANEOUS | Status: DC
  Administered 2019-05-04 – 2019-05-08 (×5): 50 mg via SUBCUTANEOUS
  Filled 2019-05-04 (×5): qty 0.6

## 2019-05-04 NOTE — Plan of Care (Signed)
  Problem: Education: Goal: Knowledge of risk factors and measures for prevention of condition will improve Outcome: Progressing   

## 2019-05-04 NOTE — Progress Notes (Signed)
PROGRESS NOTE                                                                                                                                                                                                             Patient Demographics:    Matthew Eaton, is a 59 y.o. male, DOB - [birth year], HCW:[id number]  Outpatient Primary MD for the patient is Eaton, Matthew Hamman, MD    LOS - 1  Admit date - 04/08/2019    Chief Complaint  Patient presents with  . Shortness of Breath       Brief Narrative - Matthew Eaton is a 59 y.o. male with medical history significant of hyperlipidemia, chronic pain syndrome, former smoker, GERD, depression presents to emergency department due to shortness of breath with exertion since 2 days, was diagnosed with acute hypoxic respiratory failure due to COVID-19 pneumonia and admitted to the hospital.   Subjective:    Matthew Eaton today has, No headache, No chest pain, No abdominal pain - No Nausea, No new weakness tingling or numbness, +ve SOB.   Assessment  & Plan :    1. Acute Hypoxic Resp. Failure due to Acute Covid 19 Viral Pneumonitis during the ongoing 2020 Covid 19 Pandemic - he has severe disease although his CRP is not reflective of that, he has hypoxia and large AA gradient, he has been sick for a while, he has been started on steroids and remdesivir, will initiate chest PT, increase activity and try to titrate down oxygen, if mains hypoxic suggestive of Covid pneumonia related inflammation in the lungs he will get Actemra, he has consented for the same.  It would be given the next few hours if his oxygen requirements do not improve.  Encouraged the patient to sit up in chair in the daytime use I-S and flutter valve for pulmonary toiletry and then prone in bed when at night.  Will advance activity and titrate down oxygen as possible.  Actemra off label use - patient was told that if  COVID-19 pneumonitis gets worse we might potentially use Actemra off label, patient denies any known history of tuberculosis or hepatitis, understands the risks and benefits and wants to proceed with Actemra treatment if required.     SpO2: 94 % O2 Flow Rate (L/min): 15 L/min  Recent Labs  Lab 04/04/2019 1617 04/15/2019  1715 05/04/19 0424  CRP  --  3.8* 4.8*  DDIMER  --  0.70* 0.61*  BNP 18.7  --  11.2  PROCALCITON  --  <0.10  --     Hepatic Function Latest Ref Rng & Units 05/04/2019 04/16/2019 07/01/2018  Total Protein 6.5 - 8.1 g/dL 6.0(L) 6.6 6.5  Albumin 3.5 - 5.0 g/dL 2.7(L) 3.1(L) -  AST 15 - 41 U/L 68(H) 72(H) 19  ALT 0 - 44 U/L 57(H) 59(H) 27  Alk Phosphatase 38 - 126 U/L 79 87 -  Total Bilirubin 0.3 - 1.2 mg/dL 0.5 0.5 0.3  Bilirubin, Direct 0.0 - 0.2 mg/dL - - 0.1    2.  COVID-19 infection related moderate asymptomatic transaminitis.  Stable, stable right upper quadrant ultrasound, trend.  3.  Dyslipidemia.  Continue home dose statin.  4.  History of anxiety and depression.  On multiple medications including Celexa, Wellbutrin, nortriptyline and as needed Klonopin.  Continued.  5.  Chronic neck pain.  On chronic narcotics.  Minimize dose last best as we can.  He is currently in no distress.      Condition - Extremely Guarded  Family Communication  : [name] [phone] on 05/04/19  Code Status :  Full  Diet :   Diet Order            Diet regular Room service appropriate? Yes; Fluid consistency: Thin  Diet effective now               Disposition Plan  : In the hospital and complete treatment with IV remdesivir and steroids  Consults  :  None  Procedures  :    RUQ [place] - stable  PUD Prophylaxis : PPI  DVT Prophylaxis  :  Lovenox    Lab Results  Component Value Date   PLT 209 05/04/2019    Inpatient Medications  Scheduled Meds: . vitamin C  500 mg Oral Daily  . aspirin EC  81 mg Oral Daily  . atorvastatin  80 mg Oral Q supper  .  buPROPion  300 mg Oral Daily  . citalopram  40 mg Oral QHS  . enoxaparin (LOVENOX) injection  50 mg Subcutaneous Q24H  . methylPREDNISolone (SOLU-MEDROL) injection  60 mg Intravenous Q12H  . nortriptyline  25 mg Oral QHS  . pantoprazole  40 mg Oral Daily  . zinc sulfate  220 mg Oral Daily   Continuous Infusions: . remdesivir 100 mg in NS 100 mL     PRN Meds:.acetaminophen, chlorpheniramine-HYDROcodone, clonazePAM, guaiFENesin-dextromethorphan, ipratropium-albuterol, [DISCONTINUED] ondansetron **OR** ondansetron (ZOFRAN) IV  Antibiotics  :    Anti-infectives (From admission, onward)   Start     Dose/Rate Route Frequency Ordered Stop   05/04/19 1000  remdesivir 100 mg in sodium chloride 0.9 % 100 mL IVPB     100 mg 200 mL/hr over 30 Minutes Intravenous Daily 04/13/2019 1753 05/19/2019 0959   04/17/2019 1800  remdesivir 200 mg in sodium chloride 0.9% 250 mL IVPB     200 mg 580 mL/hr over 30 Minutes Intravenous Once 04/30/2019 1753 04/29/2019 1922       Time Spent in minutes  30   Lala Lund M.D on 05/04/2019 at 9:32 AM  To page go to [email or web address] - password [facility]  [facility] -  Office  [phone]    See all Orders from today for further details    Objective:   Vitals:   05/04/19 0000 05/04/19 0308 05/04/19 0400 05/04/19 0748  BP: 119/77 119/78  114/81 109/72  Pulse:  80  81  Resp: (!) 24 (!) 22  20  Temp: 97.9 F (36.6 C) 97.9 F (36.6 C)  98.4 F (36.9 C)  TempSrc: Oral Oral  Axillary  SpO2: 93% 96%  94%  Weight:      Height:        Wt Readings from Last 3 Encounters:  04/08/2019 99.2 kg  04/11/2019 99.8 kg  04/03/19 105 kg     Intake/Output Summary (Last 24 hours) at 05/04/2019 0932 Last data filed at 04/04/2019 2300 Gross per 24 hour  Intake 779.67 ml  Output --  Net 779.67 ml     Physical Exam  Awake Alert, No new F.N deficits, Normal affect [place].AT,PERRAL Supple Neck,No JVD, No cervical lymphadenopathy appriciated.  Symmetrical Chest wall  movement, Good air movement bilaterally, CTAB RRR,No Gallops,Rubs or new Murmurs, No Parasternal Heave +ve B.Sounds, Abd Soft, No tenderness, No organomegaly appriciated, No rebound - guarding or rigidity. No Cyanosis, Clubbing or edema, No new Rash or bruise       Data Review:    CBC Recent Labs  Lab 04/10/2019 1304 04/30/2019 1745 05/04/19 0424  WBC 4.9 4.5 3.6*  HGB 13.3 13.0 12.4*  HCT 41.4 40.3 38.2*  PLT 199 196 209  MCV 94.1 92.4 92.3  MCH 30.2 29.8 30.0  MCHC 32.1 32.3 32.5  RDW 13.2 13.2 13.2  LYMPHSABS 0.9  --  0.7  MONOABS 0.4  --  0.3  EOSABS 0.0  --  0.0  BASOSABS 0.0  --  0.0    Chemistries  Recent Labs  Lab 04/13/2019 1304 04/05/2019 1614 04/16/2019 1715 05/04/19 0424  NA 139  --   --  141  K 3.8  --   --  3.8  CL 104  --   --  104  CO2 23  --   --  23  GLUCOSE 108*  --   --  132*  BUN 15  --   --  16  CREATININE 1.24  --  1.23 1.09  CALCIUM 8.7*  --   --  8.6*  AST 72*  --   --  68*  ALT 59*  --   --  57*  ALKPHOS 87  --   --  79  BILITOT 0.5  --   --  0.5  MG  --   --   --  2.3  INR  --  0.9  --   --      ------------------------------------------------------------------------------------------------------------------ Recent Labs    04/20/2019 1715  TRIG 199*    Lab Results  Component Value Date   HGBA1C 6.1 (A) 01/02/2019   ------------------------------------------------------------------------------------------------------------------ No results for input(s): TSH, T4TOTAL, T3FREE, THYROIDAB in the last 72 hours.  Invalid input(s): FREET3  Cardiac Enzymes No results for input(s): CKMB, TROPONINI, MYOGLOBIN in the last 168 hours.  Invalid input(s): CK ------------------------------------------------------------------------------------------------------------------    Component Value Date/Time   BNP 11.2 05/04/2019 0424    Micro Results Recent Results (from the past 240 hour(s))  Blood Culture (routine x 2)     Status: None  (Preliminary result)   Collection Time: 04/18/2019  4:35 PM   Specimen: BLOOD  Result Value Ref Range Status   Specimen Description BLOOD RIGHT ANTECUBITAL  Final   Special Requests   Final    BOTTLES DRAWN AEROBIC AND ANAEROBIC Blood Culture adequate volume   Culture   Final    NO GROWTH < 24 HOURS Performed at [facility],  [address], [place], [place] [address]    Report Status PENDING  Incomplete  Blood Culture (routine x 2)     Status: None (Preliminary result)   Collection Time: 04/29/2019  4:51 PM   Specimen: BLOOD  Result Value Ref Range Status   Specimen Description BLOOD LEFT ANTECUBITAL  Final   Special Requests   Final    BOTTLES DRAWN AEROBIC AND ANAEROBIC Blood Culture adequate volume   Culture   Final    NO GROWTH < 24 HOURS Performed at [facility], [place] [address], [place], [place] [address]    Report Status PENDING  Incomplete    Radiology Reports DG Chest 2 View  Result Date: 04/25/2019 CLINICAL DATA:  Shortness of breath EXAM: CHEST - 2 VIEW COMPARISON:  March 03, 2009 FINDINGS: There is ill-defined airspace consolidation in the left mid lung and left lower lung regions. There is also subtle opacity in the right perihilar region. There is mild scarring in the right base. Heart size and pulmonary vascularity are normal. No adenopathy. Postoperative change noted in lower cervical region. IMPRESSION: Ill-defined airspace opacity left mid lung and left lower lung regions as well as more subtly in the right perihilar region. Appearance consistent with multifocal pneumonia. Mild atelectasis right base. Heart size normal.  No evident adenopathy. Electronically Signed   By: Lowella Grip III M.D.   On: 04/26/2019 13:25   US Abdomen Limited RUQ  Result Date: 04/21/2019 CLINICAL DATA:  Elevated LFTs EXAM: ULTRASOUND ABDOMEN LIMITED RIGHT UPPER QUADRANT COMPARISON:  None. FINDINGS: Gallbladder: No gallstones or wall thickening visualized. No sonographic  Murphy sign noted by sonographer. Common bile duct: Not well visualized Liver: Diffusely increased in echogenicity consistent with fatty infiltration. Portal vein is patent on color Doppler imaging with normal direction of blood flow towards the liver. Other: None. IMPRESSION: Common bile duct is not visualized on this exam although no intrahepatic ductal dilatation is seen. Normal-appearing gallbladder. Fatty liver. Electronically Signed   By: Inez Catalina M.D.   On: 04/14/2019 23:52

## 2019-05-04 NOTE — Progress Notes (Signed)
Spoke to on call provider about pt being placed back onto non-rebreather. Patient's Spo2 88% on 15L HFNC after titration upwards. Patient not actively in any distress at this time. Lung sounds unchanged. Will continue to monitor overnight. Spo2 >92% on non-rebreather.

## 2019-05-04 DEATH — deceased

## 2019-05-05 ENCOUNTER — Inpatient Hospital Stay (HOSPITAL_COMMUNITY): Payer: PPO

## 2019-05-05 LAB — CBC WITH DIFFERENTIAL/PLATELET
Abs Immature Granulocytes: 0.07 10*3/uL (ref 0.00–0.07)
Basophils Absolute: 0 10*3/uL (ref 0.0–0.1)
Basophils Relative: 0 %
Eosinophils Absolute: 0 10*3/uL (ref 0.0–0.5)
Eosinophils Relative: 0 %
HCT: 38.1 % — ABNORMAL LOW (ref 39.0–52.0)
Hemoglobin: 12.1 g/dL — ABNORMAL LOW (ref 13.0–17.0)
Immature Granulocytes: 1 %
Lymphocytes Relative: 16 %
Lymphs Abs: 0.9 10*3/uL (ref 0.7–4.0)
MCH: 29.7 pg (ref 26.0–34.0)
MCHC: 31.8 g/dL (ref 30.0–36.0)
MCV: 93.6 fL (ref 80.0–100.0)
Monocytes Absolute: 0.6 10*3/uL (ref 0.1–1.0)
Monocytes Relative: 10 %
Neutro Abs: 4 10*3/uL (ref 1.7–7.7)
Neutrophils Relative %: 73 %
Platelets: 296 10*3/uL (ref 150–400)
RBC: 4.07 MIL/uL — ABNORMAL LOW (ref 4.22–5.81)
RDW: 13.2 % (ref 11.5–15.5)
WBC: 5.5 10*3/uL (ref 4.0–10.5)
nRBC: 0 % (ref 0.0–0.2)

## 2019-05-05 LAB — COMPREHENSIVE METABOLIC PANEL
ALT: 66 U/L — ABNORMAL HIGH (ref 0–44)
AST: 90 U/L — ABNORMAL HIGH (ref 15–41)
Albumin: 2.8 g/dL — ABNORMAL LOW (ref 3.5–5.0)
Alkaline Phosphatase: 76 U/L (ref 38–126)
Anion gap: 14 (ref 5–15)
BUN: 24 mg/dL — ABNORMAL HIGH (ref 6–20)
CO2: 23 mmol/L (ref 22–32)
Calcium: 9 mg/dL (ref 8.9–10.3)
Chloride: 105 mmol/L (ref 98–111)
Creatinine, Ser: 1.17 mg/dL (ref 0.61–1.24)
GFR calc Af Amer: >60 mL/min (ref >60)
GFR calc non Af Amer: >60 mL/min (ref >60)
Glucose, Bld: 128 mg/dL — ABNORMAL HIGH (ref 70–99)
Potassium: 3.8 mmol/L (ref 3.5–5.1)
Sodium: 142 mmol/L (ref 135–145)
Total Bilirubin: 0.4 mg/dL (ref 0.3–1.2)
Total Protein: 6 g/dL — ABNORMAL LOW (ref 6.5–8.1)

## 2019-05-05 LAB — C-REACTIVE PROTEIN: CRP: 2 mg/dL — ABNORMAL HIGH (ref <1.0)

## 2019-05-05 LAB — D-DIMER, QUANTITATIVE: D-Dimer, Quant: 0.37 ug/mL-FEU (ref 0.00–0.50)

## 2019-05-05 LAB — PROCALCITONIN: Procalcitonin: <0.1 ng/mL

## 2019-05-05 LAB — MAGNESIUM: Magnesium: 2.4 mg/dL (ref 1.7–2.4)

## 2019-05-05 LAB — BRAIN NATRIURETIC PEPTIDE: B Natriuretic Peptide: 40.5 pg/mL (ref 0.0–100.0)

## 2019-05-05 MED ORDER — SALINE SPRAY 0.65 % NA SOLN
1.0000 | NASAL | Status: DC | PRN
  Filled 2019-05-05: qty 44

## 2019-05-05 MED ORDER — ORAL CARE MOUTH RINSE
15.0000 mL | Freq: Two times a day (BID) | OROMUCOSAL | Status: DC
  Administered 2019-05-05 – 2019-05-08 (×7): 15 mL via OROMUCOSAL

## 2019-05-05 MED ORDER — PHENOL 1.4 % MT LIQD
1.0000 | OROMUCOSAL | Status: DC | PRN
  Administered 2019-05-05: 1 via OROMUCOSAL
  Filled 2019-05-05: qty 177

## 2019-05-05 MED ORDER — LORAZEPAM 2 MG/ML IJ SOLN
1.0000 mg | Freq: Once | INTRAMUSCULAR | Status: AC
  Administered 2019-05-05: 1 mg via INTRAVENOUS
  Filled 2019-05-05: qty 1

## 2019-05-05 MED ORDER — IOHEXOL 350 MG/ML SOLN
100.0000 mL | Freq: Once | INTRAVENOUS | Status: AC | PRN
  Administered 2019-05-05: 100 mL via INTRAVENOUS

## 2019-05-05 NOTE — Progress Notes (Signed)
PROGRESS NOTE                                                                                                                                                                                                             Patient Demographics:    Matthew Eaton, is a 59 y.o. male, DOB - August 11, 1960, KDT:[id number]  Outpatient Primary MD for the patient is Copland, Frederico Hamman, MD    LOS - 2  Admit date - 04/15/2019    Chief Complaint  Patient presents with  . Shortness of Breath       Brief Narrative - Matthew Eaton is a 59 y.o. male with medical history significant of hyperlipidemia, chronic pain syndrome, former smoker, GERD, depression presents to emergency department due to shortness of breath with exertion since 2 days, was diagnosed with acute hypoxic respiratory failure due to COVID-19 pneumonia and admitted to the hospital.   Subjective:   Patient in bed, appears comfortable, denies any headache, no fever, no chest pain or pressure, improved shortness of breath , no abdominal pain. No focal weakness.    Assessment  & Plan :    1. Acute Hypoxic Resp. Failure due to Acute Covid 19 Viral Pneumonitis during the ongoing 2020 Covid 19 Pandemic - he has severe disease although his CRP is not reflective of that, he has hypoxia and large AA gradient, he had been sick for a while before he came to the hospital, was promptly started on steroids and remdesivir, received Actemra on 05/04/2019, still requiring 15 L high flow oxygen but asymptomatic on it, continue to monitor closely.  Also check a CT angiogram to rule out underlying PE although D-dimer has only minimally elevated.  Encouraged the patient to sit up in chair in the daytime use I-S and flutter valve for pulmonary toiletry and then prone in bed when at night.  Will advance activity and titrate down oxygen as possible.  He is reluctant to sit in the recliner.   Recent Labs    Lab 04/24/2019 1617 05/02/2019 1715 05/04/19 0424 05/05/19 0452  CRP  --  3.8* 4.8* 2.0*  DDIMER  --  0.70* 0.61* 0.37  BNP 18.7  --  11.2 40.5  PROCALCITON  --  <0.10 <0.10 <0.10    Hepatic Function Latest Ref Rng & Units 05/05/2019 05/04/2019 04/12/2019  Total Protein  6.5 - 8.1 g/dL 6.0(L) 6.0(L) 6.6  Albumin 3.5 - 5.0 g/dL 2.8(L) 2.7(L) 3.1(L)  AST 15 - 41 U/L 90(H) 68(H) 72(H)  ALT 0 - 44 U/L 66(H) 57(H) 59(H)  Alk Phosphatase 38 - 126 U/L 76 79 87  Total Bilirubin 0.3 - 1.2 mg/dL 0.4 0.5 0.5  Bilirubin, Direct 0.0 - 0.2 mg/dL - - -    2.  COVID-19 infection related moderate asymptomatic transaminitis.  Stable, stable right upper quadrant ultrasound, trend.  3.  Dyslipidemia.  Continue home dose statin.  4.  History of anxiety and depression.  On multiple medications including Celexa, Wellbutrin, nortriptyline and as needed Klonopin.  Continued.  5.  Chronic neck pain.  On chronic narcotics.  Minimize dose last best as we can.  He is currently in no distress.      Condition - Extremely Guarded  Family Communication  : [name] [phone] on 05/04/19  Code Status :  Full  Diet :   Diet Order            Diet regular Room service appropriate? Yes; Fluid consistency: Thin  Diet effective now               Disposition Plan  : In the hospital and complete treatment with IV remdesivir and steroids, Actemra, CTA to rule out PE.  Still severely hypoxic.  Consults  :  None  Procedures  :    RUQ [place] - stable  PUD Prophylaxis : PPI  DVT Prophylaxis  :  Lovenox    Lab Results  Component Value Date   PLT 296 05/05/2019    Inpatient Medications  Scheduled Meds: . vitamin C  500 mg Oral Daily  . aspirin EC  81 mg Oral Daily  . atorvastatin  80 mg Oral Q supper  . buPROPion  300 mg Oral Daily  . citalopram  40 mg Oral QHS  . enoxaparin (LOVENOX) injection  50 mg Subcutaneous Q24H  . mouth rinse  15 mL Mouth Rinse BID  . methylPREDNISolone (SOLU-MEDROL)  injection  60 mg Intravenous Q12H  . nortriptyline  25 mg Oral QHS  . pantoprazole  40 mg Oral Daily  . zinc sulfate  220 mg Oral Daily   Continuous Infusions: . remdesivir 100 mg in NS 100 mL 100 mg (05/05/19 1008)   PRN Meds:.acetaminophen, chlorpheniramine-HYDROcodone, clonazePAM, guaiFENesin-dextromethorphan, HYDROcodone-acetaminophen, ipratropium-albuterol, [DISCONTINUED] ondansetron **OR** ondansetron (ZOFRAN) IV, phenol  Antibiotics  :    Anti-infectives (From admission, onward)   Start     Dose/Rate Route Frequency Ordered Stop   05/04/19 1000  remdesivir 100 mg in sodium chloride 0.9 % 100 mL IVPB     100 mg 200 mL/hr over 30 Minutes Intravenous Daily 04/30/2019 1753 05/04/2019 0959   05/02/2019 1800  remdesivir 200 mg in sodium chloride 0.9% 250 mL IVPB     200 mg 580 mL/hr over 30 Minutes Intravenous Once 04/27/2019 1753 04/21/2019 1922       Time Spent in minutes  30   Lala Lund M.D on 05/05/2019 at 10:27 AM  To page go to [email or web address] - password [facility]  [facility] -  Office  [phone]    See all Orders from today for further details    Objective:   Vitals:   05/04/19 2336 05/05/19 0323 05/05/19 0326 05/05/19 0817  BP:   113/77 (!) 165/88  Pulse: 73  72 73  Resp: 18  (!) 24 (!) 23  Temp:  (!) 97.5 F (36.4 C)  98.3 F (36.8 C)  TempSrc:  Axillary  Axillary  SpO2: 100%  94% (!) 82%  Weight:      Height:        Wt Readings from Last 3 Encounters:  05/02/2019 99.2 kg  04/27/2019 99.8 kg  04/03/19 105 kg     Intake/Output Summary (Last 24 hours) at 05/05/2019 1027 Last data filed at 05/05/2019 0500 Gross per 24 hour  Intake 1540.16 ml  Output 400 ml  Net 1140.16 ml     Physical Exam  Awake Alert, No new F.N deficits, Normal affect [place].AT,PERRAL Supple Neck,No JVD, No cervical lymphadenopathy appriciated.  Symmetrical Chest wall movement, Good air movement bilaterally, CTAB RRR,No Gallops, Rubs or new Murmurs, No Parasternal Heave +ve  B.Sounds, Abd Soft, No tenderness, No organomegaly appriciated, No rebound - guarding or rigidity. No Cyanosis, Clubbing or edema, No new Rash or bruise    Data Review:    CBC Recent Labs  Lab 04/30/2019 1304 04/27/2019 1745 05/04/19 0424 05/05/19 0452  WBC 4.9 4.5 3.6* 5.5  HGB 13.3 13.0 12.4* 12.1*  HCT 41.4 40.3 38.2* 38.1*  PLT 199 196 209 296  MCV 94.1 92.4 92.3 93.6  MCH 30.2 29.8 30.0 29.7  MCHC 32.1 32.3 32.5 31.8  RDW 13.2 13.2 13.2 13.2  LYMPHSABS 0.9  --  0.7 0.9  MONOABS 0.4  --  0.3 0.6  EOSABS 0.0  --  0.0 0.0  BASOSABS 0.0  --  0.0 0.0    Chemistries  Recent Labs  Lab 04/29/2019 1304 05/01/2019 1614 05/01/2019 1715 05/04/19 0424 05/05/19 0452  NA 139  --   --  141 142  K 3.8  --   --  3.8 3.8  CL 104  --   --  104 105  CO2 23  --   --  23 23  GLUCOSE 108*  --   --  132* 128*  BUN 15  --   --  16 24*  CREATININE 1.24  --  1.23 1.09 1.17  CALCIUM 8.7*  --   --  8.6* 9.0  AST 72*  --   --  68* 90*  ALT 59*  --   --  57* 66*  ALKPHOS 87  --   --  79 76  BILITOT 0.5  --   --  0.5 0.4  MG  --   --   --  2.3 2.4  INR  --  0.9  --   --   --      ------------------------------------------------------------------------------------------------------------------ Recent Labs    04/30/2019 1715  TRIG 199*    Lab Results  Component Value Date   HGBA1C 6.1 (A) 01/02/2019   ------------------------------------------------------------------------------------------------------------------ No results for input(s): TSH, T4TOTAL, T3FREE, THYROIDAB in the last 72 hours.  Invalid input(s): FREET3  Cardiac Enzymes No results for input(s): CKMB, TROPONINI, MYOGLOBIN in the last 168 hours.  Invalid input(s): CK ------------------------------------------------------------------------------------------------------------------    Component Value Date/Time   BNP 40.5 05/05/2019 0452    Micro Results Recent Results (from the past 240 hour(s))  Blood Culture (routine  x 2)     Status: None (Preliminary result)   Collection Time: 04/29/2019  4:35 PM   Specimen: BLOOD  Result Value Ref Range Status   Specimen Description BLOOD RIGHT ANTECUBITAL  Final   Special Requests   Final    BOTTLES DRAWN AEROBIC AND ANAEROBIC Blood Culture adequate volume   Culture   Final    NO GROWTH < 24 HOURS Performed  at [facility], [place] [address], [place], [place] [address]    Report Status PENDING  Incomplete  Blood Culture (routine x 2)     Status: None (Preliminary result)   Collection Time: 04/04/2019  4:51 PM   Specimen: BLOOD  Result Value Ref Range Status   Specimen Description BLOOD LEFT ANTECUBITAL  Final   Special Requests   Final    BOTTLES DRAWN AEROBIC AND ANAEROBIC Blood Culture adequate volume   Culture   Final    NO GROWTH < 24 HOURS Performed at [facility], [place] [address], [place], [place] [address]    Report Status PENDING  Incomplete    Radiology Reports DG Chest 2 View  Result Date: 04/24/2019 CLINICAL DATA:  Shortness of breath EXAM: CHEST - 2 VIEW COMPARISON:  March 03, 2009 FINDINGS: There is ill-defined airspace consolidation in the left mid lung and left lower lung regions. There is also subtle opacity in the right perihilar region. There is mild scarring in the right base. Heart size and pulmonary vascularity are normal. No adenopathy. Postoperative change noted in lower cervical region. IMPRESSION: Ill-defined airspace opacity left mid lung and left lower lung regions as well as more subtly in the right perihilar region. Appearance consistent with multifocal pneumonia. Mild atelectasis right base. Heart size normal.  No evident adenopathy. Electronically Signed   By: Lowella Grip III M.D.   On: 04/23/2019 13:25   DG Chest Port 1 View  Result Date: 05/05/2019 CLINICAL DATA:  Shortness of breath.  COVID positive. EXAM: PORTABLE CHEST 1 VIEW COMPARISON:  04/15/2019. FINDINGS: Mediastinum and hilar structures normal. Progressive  infiltrate noted throughout the left lung. Mild infiltrate in the right upper lung. Bilateral subsegmental atelectasis. No pleural effusion or pneumothorax. Mild cardiomegaly. No pulmonary venous congestion. Sliding hiatal hernia. Thoracic spine scoliosis. No acute bony abnormality IMPRESSION: Progressive infiltrate noted throughout the left lung. Mild infiltrate right upper lung. Bilateral subsegmental atelectasis. Electronically Signed   By: Marcello Moores  Register   On: 05/05/2019 08:39   US Abdomen Limited RUQ  Result Date: 04/28/2019 CLINICAL DATA:  Elevated LFTs EXAM: ULTRASOUND ABDOMEN LIMITED RIGHT UPPER QUADRANT COMPARISON:  None. FINDINGS: Gallbladder: No gallstones or wall thickening visualized. No sonographic Murphy sign noted by sonographer. Common bile duct: Not well visualized Liver: Diffusely increased in echogenicity consistent with fatty infiltration. Portal vein is patent on color Doppler imaging with normal direction of blood flow towards the liver. Other: None. IMPRESSION: Common bile duct is not visualized on this exam although no intrahepatic ductal dilatation is seen. Normal-appearing gallbladder. Fatty liver. Electronically Signed   By: Inez Catalina M.D.   On: 04/30/2019 23:52

## 2019-05-05 NOTE — Progress Notes (Signed)
RN in room with patient. Wife called patient to report pt complaining of severe anxiety.  RN informed pt he could not have any Klonopin at this time due to it being given twice today already. Patient increasingly agitated, pointing finger in RN's face yelling. Patient ripped his blood pressure cuff off and threw it. Patient then threatened to get out of bed and  "do whatever I want to." RN requested patient to not put finger in her face, and pt disregarded request. RN spoke to wife and informed her that Klonopin was given twice today already, wife informed RN "He takes it when he needs it and you aren't listening, we don't follow that he gets it when he wants it." Patient stated he can have his pain medicine and wanted it then. RN informed patient's wife on the phone she was going to hang up to retrieve the medication. MD paged to request medication for agitation, pain medicine given. Second RN accompanied primary RN into room for safety while administering pain medication. Will continue to monitor.

## 2019-05-06 LAB — COMPREHENSIVE METABOLIC PANEL
ALT: 66 U/L — ABNORMAL HIGH (ref 0–44)
AST: 84 U/L — ABNORMAL HIGH (ref 15–41)
Albumin: 2.6 g/dL — ABNORMAL LOW (ref 3.5–5.0)
Alkaline Phosphatase: 72 U/L (ref 38–126)
Anion gap: 10 (ref 5–15)
BUN: 29 mg/dL — ABNORMAL HIGH (ref 6–20)
CO2: 25 mmol/L (ref 22–32)
Calcium: 9 mg/dL (ref 8.9–10.3)
Chloride: 108 mmol/L (ref 98–111)
Creatinine, Ser: 1.18 mg/dL (ref 0.61–1.24)
GFR calc Af Amer: >60 mL/min (ref >60)
GFR calc non Af Amer: >60 mL/min (ref >60)
Glucose, Bld: 134 mg/dL — ABNORMAL HIGH (ref 70–99)
Potassium: 4.4 mmol/L (ref 3.5–5.1)
Sodium: 143 mmol/L (ref 135–145)
Total Bilirubin: 0.4 mg/dL (ref 0.3–1.2)
Total Protein: 5.6 g/dL — ABNORMAL LOW (ref 6.5–8.1)

## 2019-05-06 LAB — CBC WITH DIFFERENTIAL/PLATELET
Abs Immature Granulocytes: 0.19 10*3/uL — ABNORMAL HIGH (ref 0.00–0.07)
Basophils Absolute: 0 10*3/uL (ref 0.0–0.1)
Basophils Relative: 0 %
Eosinophils Absolute: 0 10*3/uL (ref 0.0–0.5)
Eosinophils Relative: 0 %
HCT: 37.1 % — ABNORMAL LOW (ref 39.0–52.0)
Hemoglobin: 11.8 g/dL — ABNORMAL LOW (ref 13.0–17.0)
Immature Granulocytes: 2 %
Lymphocytes Relative: 13 %
Lymphs Abs: 1.3 10*3/uL (ref 0.7–4.0)
MCH: 29.9 pg (ref 26.0–34.0)
MCHC: 31.8 g/dL (ref 30.0–36.0)
MCV: 93.9 fL (ref 80.0–100.0)
Monocytes Absolute: 0.9 10*3/uL (ref 0.1–1.0)
Monocytes Relative: 9 %
Neutro Abs: 7.1 10*3/uL (ref 1.7–7.7)
Neutrophils Relative %: 76 %
Platelets: 328 10*3/uL (ref 150–400)
RBC: 3.95 MIL/uL — ABNORMAL LOW (ref 4.22–5.81)
RDW: 13.2 % (ref 11.5–15.5)
WBC: 9.5 10*3/uL (ref 4.0–10.5)
nRBC: 0 % (ref 0.0–0.2)

## 2019-05-06 LAB — D-DIMER, QUANTITATIVE: D-Dimer, Quant: 0.49 ug/mL-FEU (ref 0.00–0.50)

## 2019-05-06 LAB — PROCALCITONIN: Procalcitonin: <0.1 ng/mL

## 2019-05-06 LAB — C-REACTIVE PROTEIN: CRP: 1 mg/dL — ABNORMAL HIGH (ref <1.0)

## 2019-05-06 LAB — BRAIN NATRIURETIC PEPTIDE: B Natriuretic Peptide: 59.4 pg/mL (ref 0.0–100.0)

## 2019-05-06 LAB — MAGNESIUM: Magnesium: 2.4 mg/dL (ref 1.7–2.4)

## 2019-05-06 MED ORDER — CLONAZEPAM 0.5 MG PO TABS
0.5000 mg | ORAL_TABLET | Freq: Two times a day (BID) | ORAL | Status: DC
  Administered 2019-05-06 – 2019-05-07 (×2): 0.5 mg via ORAL
  Filled 2019-05-06 (×2): qty 1

## 2019-05-06 MED ORDER — FUROSEMIDE 10 MG/ML IJ SOLN
40.0000 mg | Freq: Once | INTRAMUSCULAR | Status: AC
  Administered 2019-05-06: 40 mg via INTRAVENOUS
  Filled 2019-05-06: qty 4

## 2019-05-06 MED ORDER — LORAZEPAM 2 MG/ML IJ SOLN
1.0000 mg | INTRAMUSCULAR | Status: DC | PRN
  Administered 2019-05-06 – 2019-05-07 (×3): 1 mg via INTRAVENOUS
  Filled 2019-05-06 (×4): qty 1

## 2019-05-06 NOTE — Progress Notes (Signed)
PROGRESS NOTE                                                                                                                                                                                                             Patient Demographics:    Matthew Eaton, is a 59 y.o. male, DOB - Nov 10, 1960, MMC:[id number]  Outpatient Primary MD for the patient is Copland, Frederico Hamman, MD    LOS - 3  Admit date - 04/18/2019    Chief Complaint  Patient presents with  . Shortness of Breath       Brief Narrative - Matthew Eaton is a 59 y.o. male with medical history significant of hyperlipidemia, chronic pain syndrome, former smoker, GERD, depression presents to emergency department due to shortness of breath with exertion since 2 days, was diagnosed with acute hypoxic respiratory failure due to COVID-19 pneumonia and admitted to the hospital.   Subjective:   Patient in bed, appears comfortable, denies any headache, no fever, no chest pain or pressure, improved shortness of breath , no abdominal pain. No focal weakness.   Assessment  & Plan :    1. Acute Hypoxic Resp. Failure due to Acute Covid 19 Viral Pneumonitis during the ongoing 2020 Covid 19 Pandemic - he has severe disease although his CRP is not reflective of that, he has hypoxia and large AA gradient, he had been sick for a while before he came to the hospital, was promptly started on steroids and remdesivir, received Actemra on 05/04/2019, and requirements gradually coming down he is down from 15 L/min to 12 L/min on 05/06/2019, continue to monitor closely, negative CTA chest.  Encouraged the patient to sit up in chair in the daytime use I-S and flutter valve for pulmonary toiletry and then prone in bed when at night.  Will advance activity and titrate down oxygen as possible.  He is reluctant to sit in the recliner.  SpO2: 93 % O2 Flow Rate (L/min): 12 L/min   Recent Labs  Lab  04/18/2019 1617 05/01/2019 1715 05/04/19 0424 05/05/19 0452 05/06/19 0244  CRP  --  3.8* 4.8* 2.0* 1.0*  DDIMER  --  0.70* 0.61* 0.37 0.49  BNP 18.7  --  11.2 40.5 59.4  PROCALCITON  --  <0.10 <0.10 <0.10 <0.10    Hepatic Function Latest Ref Rng & Units 05/06/2019  05/05/2019 05/04/2019  Total Protein 6.5 - 8.1 g/dL 5.6(L) 6.0(L) 6.0(L)  Albumin 3.5 - 5.0 g/dL 2.6(L) 2.8(L) 2.7(L)  AST 15 - 41 U/L 84(H) 90(H) 68(H)  ALT 0 - 44 U/L 66(H) 66(H) 57(H)  Alk Phosphatase 38 - 126 U/L 72 76 79  Total Bilirubin 0.3 - 1.2 mg/dL 0.4 0.4 0.5  Bilirubin, Direct 0.0 - 0.2 mg/dL - - -    2.  COVID-19 infection related moderate asymptomatic transaminitis.  Stable, stable right upper quadrant ultrasound, trend.  3.  Dyslipidemia.  Continue home dose statin.  4.  History of anxiety and depression.  On multiple medications including Celexa, Wellbutrin, nortriptyline and as needed Klonopin.  Continued.  Patient is noncompliant with medications and repeatedly rude to other staff.  Counseled him and his wife.  5.  Chronic neck pain.  On chronic narcotics.  Minimize dose last best as we can.  He is currently in no distress.      Condition - Extremely Guarded  Family Communication  : [name] [phone] on 05/04/19, 05/06/19  Code Status :  Full  Diet :   Diet Order            Diet regular Room service appropriate? Yes; Fluid consistency: Thin  Diet effective now               Disposition Plan  : In the hospital and complete treatment with IV remdesivir and steroids, Actemra, CTA to rule out PE.  Still severely hypoxic.  Consults  :  None  Procedures  :    RUQ [place] - stable  CTA - 1. No evidence of pulmonary embolism. 2. Multifocal pneumonia, consistent with history of COVID-19 infection. 3. Hiatal hernia.   PUD Prophylaxis : PPI  DVT Prophylaxis  :  Lovenox    Lab Results  Component Value Date   PLT 328 05/06/2019    Inpatient Medications  Scheduled Meds: . vitamin C  500  mg Oral Daily  . aspirin EC  81 mg Oral Daily  . atorvastatin  80 mg Oral Q supper  . buPROPion  300 mg Oral Daily  . citalopram  40 mg Oral QHS  . clonazePAM  0.5 mg Oral BID  . enoxaparin (LOVENOX) injection  50 mg Subcutaneous Q24H  . LORazepam  1 mg Intravenous BID  . mouth rinse  15 mL Mouth Rinse BID  . methylPREDNISolone (SOLU-MEDROL) injection  60 mg Intravenous Q12H  . pantoprazole  40 mg Oral Daily  . zinc sulfate  220 mg Oral Daily   Continuous Infusions: . remdesivir 100 mg in NS 100 mL 100 mg (05/06/19 1018)   PRN Meds:.acetaminophen, chlorpheniramine-HYDROcodone, guaiFENesin-dextromethorphan, HYDROcodone-acetaminophen, ipratropium-albuterol, [DISCONTINUED] ondansetron **OR** ondansetron (ZOFRAN) IV, phenol, sodium chloride  Antibiotics  :    Anti-infectives (From admission, onward)   Start     Dose/Rate Route Frequency Ordered Stop   05/04/19 1000  remdesivir 100 mg in sodium chloride 0.9 % 100 mL IVPB     100 mg 200 mL/hr over 30 Minutes Intravenous Daily 04/21/2019 1753 05/10/2019 0959   04/10/2019 1800  remdesivir 200 mg in sodium chloride 0.9% 250 mL IVPB     200 mg 580 mL/hr over 30 Minutes Intravenous Once 04/11/2019 1753 04/09/2019 1922       Time Spent in minutes  30   Lala Lund M.D on 05/06/2019 at 11:06 AM  To page go to [email or web address] - password [email or web address]  [facility] -  Office  [phone]  See all Orders from today for further details    Objective:   Vitals:   05/06/19 0318 05/06/19 0400 05/06/19 0736 05/06/19 0749  BP: 105/72 111/61  111/84  Pulse: 77 76 76 73  Resp: (!) 26 (!) 24 17 (!) 25  Temp: 98.3 F (36.8 C)   (!) 97.4 F (36.3 C)  TempSrc: Oral   Oral  SpO2: 93% 93% 96% 93%  Weight:      Height:        Wt Readings from Last 3 Encounters:  04/22/2019 99.2 kg  04/11/2019 99.8 kg  04/03/19 105 kg     Intake/Output Summary (Last 24 hours) at 05/06/2019 1106 Last data filed at 05/06/2019 0700 Gross per 24 hour  Intake 1890 ml    Output 400 ml  Net 1490 ml     Physical Exam  Awake Alert, No new F.N deficits, Normal affect [place].AT,PERRAL Supple Neck,No JVD, No cervical lymphadenopathy appriciated.  Symmetrical Chest wall movement, Good air movement bilaterally, CTAB RRR,No Gallops, Rubs or new Murmurs, No Parasternal Heave +ve B.Sounds, Abd Soft, No tenderness, No organomegaly appriciated, No rebound - guarding or rigidity. No Cyanosis, Clubbing or edema, No new Rash or bruise    Data Review:    CBC Recent Labs  Lab 04/17/2019 1304 05/01/2019 1745 05/04/19 0424 05/05/19 0452 05/06/19 0244  WBC 4.9 4.5 3.6* 5.5 9.5  HGB 13.3 13.0 12.4* 12.1* 11.8*  HCT 41.4 40.3 38.2* 38.1* 37.1*  PLT 199 196 209 296 328  MCV 94.1 92.4 92.3 93.6 93.9  MCH 30.2 29.8 30.0 29.7 29.9  MCHC 32.1 32.3 32.5 31.8 31.8  RDW 13.2 13.2 13.2 13.2 13.2  LYMPHSABS 0.9  --  0.7 0.9 1.3  MONOABS 0.4  --  0.3 0.6 0.9  EOSABS 0.0  --  0.0 0.0 0.0  BASOSABS 0.0  --  0.0 0.0 0.0    Chemistries  Recent Labs  Lab 04/09/2019 1304 04/22/2019 1614 04/20/2019 1715 05/04/19 0424 05/05/19 0452 05/06/19 0244  NA 139  --   --  141 142 143  K 3.8  --   --  3.8 3.8 4.4  CL 104  --   --  104 105 108  CO2 23  --   --  _0 GLUCOSE 108*  --   --  132* 128* 134*  BUN 15  --   --  16 24* 29*  CREATININE 1.24  --  1.23 1.09 1.17 1.18  CALCIUM 8.7*  --   --  8.6* 9.0 9.0  AST 72*  --   --  68* 90* 84*  ALT 59*  --   --  57* 66* 66*  ALKPHOS 87  --   --  79 76 72  BILITOT 0.5  --   --  0.5 0.4 0.4  MG  --   --   --  2.3 2.4 2.4  INR  --  0.9  --   --   --   --      ------------------------------------------------------------------------------------------------------------------ Recent Labs    04/15/2019 1715  TRIG 199*    Lab Results  Component Value Date   HGBA1C 6.1 (A) 01/02/2019   ------------------------------------------------------------------------------------------------------------------ No results for input(s): TSH,  T4TOTAL, T3FREE, THYROIDAB in the last 72 hours.  Invalid input(s): FREET3  Cardiac Enzymes No results for input(s): CKMB, TROPONINI, MYOGLOBIN in the last 168 hours.  Invalid input(s): CK ------------------------------------------------------------------------------------------------------------------    Component Value Date/Time   BNP 59.4 05/06/2019 0244  Micro Results Recent Results (from the past 240 hour(s))  Blood Culture (routine x 2)     Status: None (Preliminary result)   Collection Time: 04/11/2019  4:35 PM   Specimen: BLOOD  Result Value Ref Range Status   Specimen Description BLOOD RIGHT ANTECUBITAL  Final   Special Requests   Final    BOTTLES DRAWN AEROBIC AND ANAEROBIC Blood Culture adequate volume   Culture   Final    NO GROWTH 2 DAYS Performed at [facility], [address] [place], [place] [address]    Report Status PENDING  Incomplete  Blood Culture (routine x 2)     Status: None (Preliminary result)   Collection Time: 04/12/2019  4:51 PM   Specimen: BLOOD  Result Value Ref Range Status   Specimen Description BLOOD LEFT ANTECUBITAL  Final   Special Requests   Final    BOTTLES DRAWN AEROBIC AND ANAEROBIC Blood Culture adequate volume   Culture   Final    NO GROWTH 2 DAYS Performed at [facility], [place] [address], [place], [place] [address]    Report Status PENDING  Incomplete    Radiology Reports DG Chest 2 View  Result Date: 04/09/2019 CLINICAL DATA:  Shortness of breath EXAM: CHEST - 2 VIEW COMPARISON:  March 03, 2009 FINDINGS: There is ill-defined airspace consolidation in the left mid lung and left lower lung regions. There is also subtle opacity in the right perihilar region. There is mild scarring in the right base. Heart size and pulmonary vascularity are normal. No adenopathy. Postoperative change noted in lower cervical region. IMPRESSION: Ill-defined airspace opacity left mid lung and left lower lung regions as well as more subtly  in the right perihilar region. Appearance consistent with multifocal pneumonia. Mild atelectasis right base. Heart size normal.  No evident adenopathy. Electronically Signed   By: Lowella Grip III M.D.   On: 04/19/2019 13:25   CT ANGIO CHEST PE W OR WO CONTRAST  Result Date: 05/05/2019 CLINICAL DATA:  Shortness of breath. COVID positive. EXAM: CT ANGIOGRAPHY CHEST WITH CONTRAST TECHNIQUE: Multidetector CT imaging of the chest was performed using the standard protocol during bolus administration of intravenous contrast. Multiplanar CT image reconstructions and MIPs were obtained to evaluate the vascular anatomy. CONTRAST:  136m OMNIPAQUE IOHEXOL 350 MG/ML SOLN COMPARISON:  Chest x-ray from same day. FINDINGS: Cardiovascular: Satisfactory opacification of the pulmonary arteries to the segmental level. No evidence of pulmonary embolism. Normal heart size. No pericardial effusion. No thoracic aortic aneurysm or dissection. Mediastinum/Nodes: No enlarged mediastinal, hilar, or axillary lymph nodes. Thyroid gland, trachea, and esophagus demonstrate no significant findings. Lungs/Pleura: Patchy bilateral somewhat confluent ground-glass densities with areas of septal thickening in the left greater than right upper and lower lobes. Areas of subpleural banding and architectural distortion in the right lung. No pleural effusion or pneumothorax. Upper Abdomen: No acute abnormality. Small to moderate hiatal hernia. Musculoskeletal: No chest wall abnormality. No acute or significant osseous findings. Review of the MIP images confirms the above findings. IMPRESSION: 1. No evidence of pulmonary embolism. 2. Multifocal pneumonia, consistent with history of COVID-19 infection. 3. Hiatal hernia. Electronically Signed   By: WTitus DubinM.D.   On: 05/05/2019 12:36   DG Chest Port 1 View  Result Date: 05/05/2019 CLINICAL DATA:  Shortness of breath.  COVID positive. EXAM: PORTABLE CHEST 1 VIEW COMPARISON:  04/26/2019.  FINDINGS: Mediastinum and hilar structures normal. Progressive infiltrate noted throughout the left lung. Mild infiltrate in the right  upper lung. Bilateral subsegmental atelectasis. No pleural effusion or pneumothorax. Mild cardiomegaly. No pulmonary venous congestion. Sliding hiatal hernia. Thoracic spine scoliosis. No acute bony abnormality IMPRESSION: Progressive infiltrate noted throughout the left lung. Mild infiltrate right upper lung. Bilateral subsegmental atelectasis. Electronically Signed   By: Marcello Moores  Register   On: 05/05/2019 08:39   US Abdomen Limited RUQ  Result Date: 04/04/2019 CLINICAL DATA:  Elevated LFTs EXAM: ULTRASOUND ABDOMEN LIMITED RIGHT UPPER QUADRANT COMPARISON:  None. FINDINGS: Gallbladder: No gallstones or wall thickening visualized. No sonographic Murphy sign noted by sonographer. Common bile duct: Not well visualized Liver: Diffusely increased in echogenicity consistent with fatty infiltration. Portal vein is patent on color Doppler imaging with normal direction of blood flow towards the liver. Other: None. IMPRESSION: Common bile duct is not visualized on this exam although no intrahepatic ductal dilatation is seen. Normal-appearing gallbladder. Fatty liver. Electronically Signed   By: Inez Catalina M.D.   On: 04/29/2019 23:52

## 2019-05-07 LAB — CBC WITH DIFFERENTIAL/PLATELET
Abs Immature Granulocytes: 0.31 10*3/uL — ABNORMAL HIGH (ref 0.00–0.07)
Basophils Absolute: 0 10*3/uL (ref 0.0–0.1)
Basophils Relative: 0 %
Eosinophils Absolute: 0 10*3/uL (ref 0.0–0.5)
Eosinophils Relative: 0 %
HCT: 40 % (ref 39.0–52.0)
Hemoglobin: 12.8 g/dL — ABNORMAL LOW (ref 13.0–17.0)
Immature Granulocytes: 3 %
Lymphocytes Relative: 14 %
Lymphs Abs: 1.6 10*3/uL (ref 0.7–4.0)
MCH: 29.8 pg (ref 26.0–34.0)
MCHC: 32 g/dL (ref 30.0–36.0)
MCV: 93 fL (ref 80.0–100.0)
Monocytes Absolute: 0.9 10*3/uL (ref 0.1–1.0)
Monocytes Relative: 7 %
Neutro Abs: 9 10*3/uL — ABNORMAL HIGH (ref 1.7–7.7)
Neutrophils Relative %: 76 %
Platelets: 378 10*3/uL (ref 150–400)
RBC: 4.3 MIL/uL (ref 4.22–5.81)
RDW: 13.2 % (ref 11.5–15.5)
WBC: 11.8 10*3/uL — ABNORMAL HIGH (ref 4.0–10.5)
nRBC: 0.6 % — ABNORMAL HIGH (ref 0.0–0.2)

## 2019-05-07 LAB — COMPREHENSIVE METABOLIC PANEL
ALT: 96 U/L — ABNORMAL HIGH (ref 0–44)
AST: 85 U/L — ABNORMAL HIGH (ref 15–41)
Albumin: 2.9 g/dL — ABNORMAL LOW (ref 3.5–5.0)
Alkaline Phosphatase: 77 U/L (ref 38–126)
Anion gap: 9 (ref 5–15)
BUN: 26 mg/dL — ABNORMAL HIGH (ref 6–20)
CO2: 25 mmol/L (ref 22–32)
Calcium: 8.8 mg/dL — ABNORMAL LOW (ref 8.9–10.3)
Chloride: 107 mmol/L (ref 98–111)
Creatinine, Ser: 1.21 mg/dL (ref 0.61–1.24)
GFR calc Af Amer: >60 mL/min (ref >60)
GFR calc non Af Amer: >60 mL/min (ref >60)
Glucose, Bld: 144 mg/dL — ABNORMAL HIGH (ref 70–99)
Potassium: 4.2 mmol/L (ref 3.5–5.1)
Sodium: 141 mmol/L (ref 135–145)
Total Bilirubin: 0.4 mg/dL (ref 0.3–1.2)
Total Protein: 5.8 g/dL — ABNORMAL LOW (ref 6.5–8.1)

## 2019-05-07 LAB — C-REACTIVE PROTEIN: CRP: 1 mg/dL — ABNORMAL HIGH (ref <1.0)

## 2019-05-07 LAB — BRAIN NATRIURETIC PEPTIDE: B Natriuretic Peptide: 32.7 pg/mL (ref 0.0–100.0)

## 2019-05-07 LAB — PROCALCITONIN: Procalcitonin: <0.1 ng/mL

## 2019-05-07 LAB — D-DIMER, QUANTITATIVE: D-Dimer, Quant: 0.52 ug/mL-FEU — ABNORMAL HIGH (ref 0.00–0.50)

## 2019-05-07 LAB — MAGNESIUM: Magnesium: 2.5 mg/dL — ABNORMAL HIGH (ref 1.7–2.4)

## 2019-05-07 MED ORDER — MORPHINE SULFATE (PF) 2 MG/ML IV SOLN
2.0000 mg | Freq: Three times a day (TID) | INTRAVENOUS | Status: DC | PRN
  Administered 2019-05-07: 2 mg via INTRAVENOUS
  Filled 2019-05-07 (×2): qty 1

## 2019-05-07 MED ORDER — ALPRAZOLAM 0.5 MG PO TABS
0.5000 mg | ORAL_TABLET | Freq: Four times a day (QID) | ORAL | Status: DC | PRN
  Administered 2019-05-07: 0.5 mg via ORAL
  Filled 2019-05-07 (×2): qty 1

## 2019-05-07 MED ORDER — MORPHINE SULFATE (PF) 2 MG/ML IV SOLN
2.0000 mg | Freq: Once | INTRAVENOUS | Status: AC
  Administered 2019-05-07: 2 mg via INTRAVENOUS
  Filled 2019-05-07: qty 1

## 2019-05-07 NOTE — Progress Notes (Signed)
   05/07/19 2230  What Happened  Was fall witnessed? No  Was patient injured? Unsure (pt states to not be in pain)  Patient found on floor  Found by Staff-comment (IV nurse; Mendi, RN)  Stated prior activity ambulating-unassisted (Pt was able to call if assistance was needed)  Follow Up  MD notified [place]  Time MD notified 2250  Family notified Yes - comment (Wife of patient was notified)  Time family notified 2244  Additional tests No  Simple treatment Other (comment) (Morphine given for panic attack)  Progress note created (see row info) Yes  Adult Fall Risk Assessment  Risk Factor Category (scoring not indicated) Fall has occurred during this admission (document High fall risk);Not Applicable  Age 59  Fall History: Fall within 6 months prior to admission 0  Elimination; Bowel and/or Urine Incontinence 0  Elimination; Bowel and/or Urine Urgency/Frequency 0  Medications: includes PCA/Opiates, Anti-convulsants, Anti-hypertensives, Diuretics, Hypnotics, Laxatives, Sedatives, and Psychotropics 5  Patient Care Equipment 2  Mobility-Assistance 2  Mobility-Gait 2  Mobility-Sensory Deficit 0  Altered awareness of immediate physical environment 0  Impulsiveness 0  Lack of understanding of one's physical/cognitive limitations 0  Total Score 11  Patient Fall Risk Level High fall risk  Adult Fall Risk Interventions  Required Bundle Interventions *See Row Information* High fall risk - low, moderate, and high requirements implemented  Additional Interventions Use of appropriate toileting equipment (bedpan, BSC, etc.)  Screening for Fall Injury Risk (To be completed on HIGH fall risk patients) - Assessing Need for Low Bed  Risk For Fall Injury- Low Bed Criteria Admitted as a result of a fall  Will Implement Low Bed and Floor Mats Yes  Screening for Fall Injury Risk (To be completed on HIGH fall risk patients who do not meet crieteria for Low Bed) - Assessing Need for Floor Mats Only   Risk For Fall Injury- Criteria for Floor Mats None identified - No additional interventions needed  Vitals  Temp 98 F (36.7 C)  Temp Source Axillary  BP 107/83  MAP (mmHg) 92  BP Location Left Arm  BP Method Automatic  Patient Position (if appropriate) Lying  Pulse Rate (!) 102  Pulse Rate Source Monitor  ECG Heart Rate (!) 102  Cardiac Rhythm NSR  Resp (!) 31  Oxygen Therapy  SpO2 100 %  O2 Device Non-rebreather Mask  Pain Assessment  Pain Scale 0-10  Pain Score 0  Faces Pain Scale 0  Neurological  Neuro (WDL) X  Level of Consciousness Alert  Orientation Level Oriented X4  Cognition Appropriate at baseline

## 2019-05-07 NOTE — Progress Notes (Addendum)
Mendi, RN who was covering this RNs patients while on break was called in patient's room by IV nurse Lenna Sciara Nakib). Patient was found [place] style on the side of bed. She states patient was in a severe panic attack. This RN reported to the room. He was placed on NRB. Patient is AXO4, states he "was trying to get up to go to the bathroom". Morphine was given at bedside. Patient was able to get back in bed independently and follow commands during assessment.   T: 98.0, BP: 107/83, HR: 92, O2: 91% on 10 L HFNC.    MD on call notified. Patients wife was notified & talked to patient as well.   Will continue to monitor.

## 2019-05-07 NOTE — Progress Notes (Signed)
Pt having severe anxiety, increasing throughout afternoon. Pt also c/o feeling "heat rushing throughout body." Unable to get cool, anxiety increased. Pt was draped in wet, cool towels, given ice packs, and had fan placed directly on him. Unable to relax, verbal order from Dr. Lala Lund was received to add PRN morphine 2mg  IV and and PRN xanax 0.5mg  PO. Pt given IV morphine. Will continue to monitor and assess effectiveness.

## 2019-05-07 NOTE — Progress Notes (Signed)
PROGRESS NOTE                                                                                                                                                                                                             Patient Demographics:    Matthew Eaton, is a 59 y.o. male, DOB - March 08, 1960, [id number]  Outpatient Primary MD for the patient is Copland, Frederico Hamman, MD    LOS - 4  Admit date - 04/14/2019    Chief Complaint  Patient presents with  . Shortness of Breath       Brief Narrative - Matthew Eaton is a 59 y.o. male with medical history significant of hyperlipidemia, chronic pain syndrome, former smoker, GERD, depression presents to emergency department due to shortness of breath with exertion since 2 days, was diagnosed with acute hypoxic respiratory failure due to COVID-19 pneumonia and admitted to the hospital.   Subjective:   Patient in bed, appears comfortable, denies any headache, no fever, no chest pain or pressure, improved shortness of breath , no abdominal pain. No focal weakness.    Assessment  & Plan :    1. Acute Hypoxic Resp. Failure due to Acute Covid 19 Viral Pneumonitis during the ongoing 2020 Covid 19 Pandemic - he has severe disease although his CRP is not reflective of that, he has hypoxia and large AA gradient, he had been sick for a while before he came to the hospital, was promptly started on steroids and remdesivir, received Actemra on 05/04/2019, and requirements gradually coming down he is down from 15 L/min to 5 L on 05/07/2019, continue to monitor closely, negative CTA chest.  Encouraged the patient to sit up in chair in the daytime use I-S and flutter valve for pulmonary toiletry and then prone in bed when at night.  Will advance activity and titrate down oxygen as possible.  He is reluctant to sit in the recliner.  SpO2: 93 % O2 Flow Rate (L/min): 6 L/min   Recent Labs  Lab  04/12/2019 1617 04/05/2019 1715 05/04/19 0424 05/05/19 0452 05/06/19 0244 05/07/19 0611  CRP  --  3.8* 4.8* 2.0* 1.0* 1.0*  DDIMER  --  0.70* 0.61* 0.37 0.49 0.52*  BNP 18.7  --  11.2 40.5 59.4 32.7  PROCALCITON  --  <0.10 <0.10 <0.10 <0.10 <0.10    Hepatic  Function Latest Ref Rng & Units 05/07/2019 05/06/2019 05/05/2019  Total Protein 6.5 - 8.1 g/dL 5.8(L) 5.6(L) 6.0(L)  Albumin 3.5 - 5.0 g/dL 2.9(L) 2.6(L) 2.8(L)  AST 15 - 41 U/L 85(H) 84(H) 90(H)  ALT 0 - 44 U/L 96(H) 66(H) 66(H)  Alk Phosphatase 38 - 126 U/L 77 72 76  Total Bilirubin 0.3 - 1.2 mg/dL 0.4 0.4 0.4  Bilirubin, Direct 0.0 - 0.2 mg/dL - - -    2.  COVID-19 infection related moderate asymptomatic transaminitis.  Stable, stable right upper quadrant ultrasound, trend.  3.  Dyslipidemia.  Continue home dose statin.  4.  History of anxiety and depression.  On multiple medications including Celexa, Wellbutrin, nortriptyline and as needed Klonopin.  Continued.  Patient is noncompliant with medications and repeatedly rude to other staff.  Counseled him and his wife.  5.  Chronic neck pain.  On chronic narcotics.  Minimize dose last best as we can.  He is currently in no distress.      Condition - Extremely Guarded  Family Communication  : [name] [phone] on 05/04/19, 05/06/19  Code Status :  Full  Diet :   Diet Order            Diet regular Room service appropriate? Yes; Fluid consistency: Thin  Diet effective now               Disposition Plan  : In the hospital and complete treatment with IV remdesivir and steroids, Actemra, CTA to rule out PE.  Still severely hypoxic.  Consults  :  None  Procedures  :    RUQ [place] - stable  CTA - 1. No evidence of pulmonary embolism. 2. Multifocal pneumonia, consistent with history of COVID-19 infection. 3. Hiatal hernia.   PUD Prophylaxis : PPI  DVT Prophylaxis  :  Lovenox    Lab Results  Component Value Date   PLT 378 05/07/2019    Inpatient  Medications  Scheduled Meds: . vitamin C  500 mg Oral Daily  . aspirin EC  81 mg Oral Daily  . atorvastatin  80 mg Oral Q supper  . buPROPion  300 mg Oral Daily  . citalopram  40 mg Oral QHS  . clonazePAM  0.5 mg Oral BID  . enoxaparin (LOVENOX) injection  50 mg Subcutaneous Q24H  . mouth rinse  15 mL Mouth Rinse BID  . methylPREDNISolone (SOLU-MEDROL) injection  60 mg Intravenous Q12H  . pantoprazole  40 mg Oral Daily  . zinc sulfate  220 mg Oral Daily   Continuous Infusions:  PRN Meds:.acetaminophen, chlorpheniramine-HYDROcodone, guaiFENesin-dextromethorphan, HYDROcodone-acetaminophen, ipratropium-albuterol, LORazepam, [DISCONTINUED] ondansetron **OR** ondansetron (ZOFRAN) IV, phenol, sodium chloride  Antibiotics  :    Anti-infectives (From admission, onward)   Start     Dose/Rate Route Frequency Ordered Stop   05/04/19 1000  remdesivir 100 mg in sodium chloride 0.9 % 100 mL IVPB     100 mg 200 mL/hr over 30 Minutes Intravenous Daily 04/13/2019 1753 05/07/19 1033   04/26/2019 1800  remdesivir 200 mg in sodium chloride 0.9% 250 mL IVPB     200 mg 580 mL/hr over 30 Minutes Intravenous Once 04/20/2019 1753 04/28/2019 1922       Time Spent in minutes  30   Lala Lund M.D on 05/07/2019 at 10:56 AM  To page go to [email or web address] - password [email or web address]  [facility] -  Office  [phone]    See all Orders from today for further details    Objective:  Vitals:   05/07/19 0446 05/07/19 0457 05/07/19 0505 05/07/19 0742  BP: (!) 129/96   102/64  Pulse: 81  69 78  Resp: (!) 32 (!) 27 (!) 24 20  Temp: 97.8 F (36.6 C)   97.9 F (36.6 C)  TempSrc: Axillary   Oral  SpO2:  (!) 89% 94% 93%  Weight:      Height:        Wt Readings from Last 3 Encounters:  04/13/2019 99.2 kg  04/23/2019 99.8 kg  04/03/19 105 kg     Intake/Output Summary (Last 24 hours) at 05/07/2019 1056 Last data filed at 05/07/2019 0850 Gross per 24 hour  Intake 650 ml  Output 2360 ml  Net -1710 ml      Physical Exam  Awake Alert, No new F.N deficits, Normal affect [place].AT,PERRAL Supple Neck,No JVD, No cervical lymphadenopathy appriciated.  Symmetrical Chest wall movement, Good air movement bilaterally, CTAB RRR,No Gallops, Rubs or new Murmurs, No Parasternal Heave +ve B.Sounds, Abd Soft, No tenderness, No organomegaly appriciated, No rebound - guarding or rigidity. No Cyanosis, Clubbing or edema, No new Rash or bruise    Data Review:    CBC Recent Labs  Lab 04/21/2019 1304 04/05/2019 1304 05/01/2019 1745 05/04/19 0424 05/05/19 0452 05/06/19 0244 05/07/19 0611  WBC 4.9   < > 4.5 3.6* 5.5 9.5 11.8*  HGB 13.3   < > 13.0 12.4* 12.1* 11.8* 12.8*  HCT 41.4   < > 40.3 38.2* 38.1* 37.1* 40.0  PLT 199   < > 196 209 296 328 378  MCV 94.1   < > 92.4 92.3 93.6 93.9 93.0  MCH 30.2   < > 29.8 30.0 29.7 29.9 29.8  MCHC 32.1   < > 32.3 32.5 31.8 31.8 32.0  RDW 13.2   < > 13.2 13.2 13.2 13.2 13.2  LYMPHSABS 0.9  --   --  0.7 0.9 1.3 1.6  MONOABS 0.4  --   --  0.3 0.6 0.9 0.9  EOSABS 0.0  --   --  0.0 0.0 0.0 0.0  BASOSABS 0.0  --   --  0.0 0.0 0.0 0.0   < > = values in this interval not displayed.    Chemistries  Recent Labs  Lab 04/14/2019 1304 04/25/2019 1304 04/07/2019 1614 04/23/2019 1715 05/04/19 0424 05/05/19 0452 05/06/19 0244 05/07/19 0611  NA 139  --   --   --  141 142 143 141  K 3.8  --   --   --  3.8 3.8 4.4 4.2  CL 104  --   --   --  104 105 108 107  CO2 23  --   --   --  23 23 25 25   GLUCOSE 108*  --   --   --  132* 128* 134* 144*  BUN 15  --   --   --  16 24* 29* 26*  CREATININE 1.24   < >  --  1.23 1.09 1.17 1.18 1.21  CALCIUM 8.7*  --   --   --  8.6* 9.0 9.0 8.8*  AST 72*  --   --   --  68* 90* 84* 85*  ALT 59*  --   --   --  57* 66* 66* 96*  ALKPHOS 87  --   --   --  79 76 72 77  BILITOT 0.5  --   --   --  0.5 0.4 0.4 0.4  MG  --   --   --   --  2.3 2.4 2.4 2.5*  INR  --   --  0.9  --   --   --   --   --    < > = values in this interval not displayed.      ------------------------------------------------------------------------------------------------------------------ No results for input(s): CHOL, HDL, LDLCALC, TRIG, CHOLHDL, LDLDIRECT in the last 72 hours.  Lab Results  Component Value Date   HGBA1C 6.1 (A) 01/02/2019   ------------------------------------------------------------------------------------------------------------------ No results for input(s): TSH, T4TOTAL, T3FREE, THYROIDAB in the last 72 hours.  Invalid input(s): FREET3  Cardiac Enzymes No results for input(s): CKMB, TROPONINI, MYOGLOBIN in the last 168 hours.  Invalid input(s): CK ------------------------------------------------------------------------------------------------------------------    Component Value Date/Time   BNP 32.7 05/07/2019 7867    Micro Results Recent Results (from the past 240 hour(s))  Blood Culture (routine x 2)     Status: None (Preliminary result)   Collection Time: 04/28/2019  4:35 PM   Specimen: BLOOD  Result Value Ref Range Status   Specimen Description BLOOD RIGHT ANTECUBITAL  Final   Special Requests   Final    BOTTLES DRAWN AEROBIC AND ANAEROBIC Blood Culture adequate volume   Culture   Final    NO GROWTH 4 DAYS Performed at [facility], [address], [place], [place] [address]    Report Status PENDING  Incomplete  Blood Culture (routine x 2)     Status: None (Preliminary result)   Collection Time: 04/15/2019  4:51 PM   Specimen: BLOOD  Result Value Ref Range Status   Specimen Description BLOOD LEFT ANTECUBITAL  Final   Special Requests   Final    BOTTLES DRAWN AEROBIC AND ANAEROBIC Blood Culture adequate volume   Culture   Final    NO GROWTH 4 DAYS Performed at [facility], [place] [address] [place], [place] [address]    Report Status PENDING  Incomplete    Radiology Reports DG Chest 2 View  Result Date: 04/08/2019 CLINICAL DATA:  Shortness of breath EXAM: CHEST - 2 VIEW COMPARISON:  March 03, 2009  FINDINGS: There is ill-defined airspace consolidation in the left mid lung and left lower lung regions. There is also subtle opacity in the right perihilar region. There is mild scarring in the right base. Heart size and pulmonary vascularity are normal. No adenopathy. Postoperative change noted in lower cervical region. IMPRESSION: Ill-defined airspace opacity left mid lung and left lower lung regions as well as more subtly in the right perihilar region. Appearance consistent with multifocal pneumonia. Mild atelectasis right base. Heart size normal.  No evident adenopathy. Electronically Signed   By: Lowella Grip III M.D.   On: 04/08/2019 13:25   CT ANGIO CHEST PE W OR WO CONTRAST  Result Date: 05/05/2019 CLINICAL DATA:  Shortness of breath. COVID positive. EXAM: CT ANGIOGRAPHY CHEST WITH CONTRAST TECHNIQUE: Multidetector CT imaging of the chest was performed using the standard protocol during bolus administration of intravenous contrast. Multiplanar CT image reconstructions and MIPs were obtained to evaluate the vascular anatomy. CONTRAST:  166m OMNIPAQUE IOHEXOL 350 MG/ML SOLN COMPARISON:  Chest x-ray from same day. FINDINGS: Cardiovascular: Satisfactory opacification of the pulmonary arteries to the segmental level. No evidence of pulmonary embolism. Normal heart size. No pericardial effusion. No thoracic aortic aneurysm or dissection. Mediastinum/Nodes: No enlarged mediastinal, hilar, or axillary lymph nodes. Thyroid gland, trachea, and esophagus demonstrate no significant findings. Lungs/Pleura: Patchy bilateral somewhat confluent ground-glass densities with areas of septal thickening in the left greater than right upper and  lower lobes. Areas of subpleural banding and architectural distortion in the right lung. No pleural effusion or pneumothorax. Upper Abdomen: No acute abnormality. Small to moderate hiatal hernia. Musculoskeletal: No chest wall abnormality. No acute or significant osseous findings.  Review of the MIP images confirms the above findings. IMPRESSION: 1. No evidence of pulmonary embolism. 2. Multifocal pneumonia, consistent with history of COVID-19 infection. 3. Hiatal hernia. Electronically Signed   By: Titus Dubin M.D.   On: 05/05/2019 12:36   DG Chest Port 1 View  Result Date: 05/05/2019 CLINICAL DATA:  Shortness of breath.  COVID positive. EXAM: PORTABLE CHEST 1 VIEW COMPARISON:  04/26/2019. FINDINGS: Mediastinum and hilar structures normal. Progressive infiltrate noted throughout the left lung. Mild infiltrate in the right upper lung. Bilateral subsegmental atelectasis. No pleural effusion or pneumothorax. Mild cardiomegaly. No pulmonary venous congestion. Sliding hiatal hernia. Thoracic spine scoliosis. No acute bony abnormality IMPRESSION: Progressive infiltrate noted throughout the left lung. Mild infiltrate right upper lung. Bilateral subsegmental atelectasis. Electronically Signed   By: Marcello Moores  Register   On: 05/05/2019 08:39   US Abdomen Limited RUQ  Result Date: 04/25/2019 CLINICAL DATA:  Elevated LFTs EXAM: ULTRASOUND ABDOMEN LIMITED RIGHT UPPER QUADRANT COMPARISON:  None. FINDINGS: Gallbladder: No gallstones or wall thickening visualized. No sonographic Murphy sign noted by sonographer. Common bile duct: Not well visualized Liver: Diffusely increased in echogenicity consistent with fatty infiltration. Portal vein is patent on color Doppler imaging with normal direction of blood flow towards the liver. Other: None. IMPRESSION: Common bile duct is not visualized on this exam although no intrahepatic ductal dilatation is seen. Normal-appearing gallbladder. Fatty liver. Electronically Signed   By: Inez Catalina M.D.   On: 04/26/2019 23:52

## 2019-05-08 ENCOUNTER — Inpatient Hospital Stay (HOSPITAL_COMMUNITY): Payer: PPO

## 2019-05-08 ENCOUNTER — Encounter (HOSPITAL_COMMUNITY): Payer: Self-pay

## 2019-05-08 ENCOUNTER — Inpatient Hospital Stay (HOSPITAL_COMMUNITY)
Admission: EM | Admit: 2019-05-08 | Discharge: 2019-07-04 | Disposition: E | Payer: PPO | Source: Home / Self Care | Attending: Critical Care Medicine | Admitting: Critical Care Medicine

## 2019-05-08 ENCOUNTER — Telehealth: Payer: Self-pay | Admitting: Family Medicine

## 2019-05-08 ENCOUNTER — Other Ambulatory Visit: Payer: Self-pay

## 2019-05-08 DIAGNOSIS — Y95 Nosocomial condition: Secondary | ICD-10-CM | POA: Diagnosis not present

## 2019-05-08 DIAGNOSIS — Y92230 Patient room in hospital as the place of occurrence of the external cause: Secondary | ICD-10-CM | POA: Diagnosis not present

## 2019-05-08 DIAGNOSIS — J9600 Acute respiratory failure, unspecified whether with hypoxia or hypercapnia: Secondary | ICD-10-CM

## 2019-05-08 DIAGNOSIS — Z9114 Patient's other noncompliance with medication regimen: Secondary | ICD-10-CM

## 2019-05-08 DIAGNOSIS — G9340 Encephalopathy, unspecified: Secondary | ICD-10-CM

## 2019-05-08 DIAGNOSIS — M5020 Other cervical disc displacement, unspecified cervical region: Secondary | ICD-10-CM | POA: Diagnosis present

## 2019-05-08 DIAGNOSIS — I4581 Long QT syndrome: Secondary | ICD-10-CM | POA: Diagnosis present

## 2019-05-08 DIAGNOSIS — J1282 Pneumonia due to coronavirus disease 2019: Secondary | ICD-10-CM | POA: Diagnosis present

## 2019-05-08 DIAGNOSIS — G931 Anoxic brain damage, not elsewhere classified: Secondary | ICD-10-CM | POA: Diagnosis not present

## 2019-05-08 DIAGNOSIS — F411 Generalized anxiety disorder: Secondary | ICD-10-CM | POA: Diagnosis present

## 2019-05-08 DIAGNOSIS — X58XXXA Exposure to other specified factors, initial encounter: Secondary | ICD-10-CM | POA: Diagnosis not present

## 2019-05-08 DIAGNOSIS — J9690 Respiratory failure, unspecified, unspecified whether with hypoxia or hypercapnia: Secondary | ICD-10-CM

## 2019-05-08 DIAGNOSIS — D539 Nutritional anemia, unspecified: Secondary | ICD-10-CM | POA: Diagnosis not present

## 2019-05-08 DIAGNOSIS — Z515 Encounter for palliative care: Secondary | ICD-10-CM | POA: Diagnosis not present

## 2019-05-08 DIAGNOSIS — J869 Pyothorax without fistula: Secondary | ICD-10-CM | POA: Diagnosis not present

## 2019-05-08 DIAGNOSIS — T82528A Displacement of other cardiac and vascular devices and implants, initial encounter: Secondary | ICD-10-CM

## 2019-05-08 DIAGNOSIS — Z87891 Personal history of nicotine dependence: Secondary | ICD-10-CM

## 2019-05-08 DIAGNOSIS — Z4659 Encounter for fitting and adjustment of other gastrointestinal appliance and device: Secondary | ICD-10-CM

## 2019-05-08 DIAGNOSIS — G894 Chronic pain syndrome: Secondary | ICD-10-CM | POA: Diagnosis present

## 2019-05-08 DIAGNOSIS — F05 Delirium due to known physiological condition: Secondary | ICD-10-CM | POA: Diagnosis not present

## 2019-05-08 DIAGNOSIS — E785 Hyperlipidemia, unspecified: Secondary | ICD-10-CM | POA: Diagnosis present

## 2019-05-08 DIAGNOSIS — Z888 Allergy status to other drugs, medicaments and biological substances status: Secondary | ICD-10-CM

## 2019-05-08 DIAGNOSIS — T17890A Other foreign object in other parts of respiratory tract causing asphyxiation, initial encounter: Secondary | ICD-10-CM | POA: Diagnosis not present

## 2019-05-08 DIAGNOSIS — J15211 Pneumonia due to Methicillin susceptible Staphylococcus aureus: Secondary | ICD-10-CM | POA: Diagnosis not present

## 2019-05-08 DIAGNOSIS — J189 Pneumonia, unspecified organism: Secondary | ICD-10-CM

## 2019-05-08 DIAGNOSIS — Z781 Physical restraint status: Secondary | ICD-10-CM

## 2019-05-08 DIAGNOSIS — L8990 Pressure ulcer of unspecified site, unspecified stage: Secondary | ICD-10-CM | POA: Insufficient documentation

## 2019-05-08 DIAGNOSIS — G9341 Metabolic encephalopathy: Secondary | ICD-10-CM | POA: Diagnosis not present

## 2019-05-08 DIAGNOSIS — B961 Klebsiella pneumoniae [K. pneumoniae] as the cause of diseases classified elsewhere: Secondary | ICD-10-CM | POA: Diagnosis not present

## 2019-05-08 DIAGNOSIS — Z79899 Other long term (current) drug therapy: Secondary | ICD-10-CM

## 2019-05-08 DIAGNOSIS — K449 Diaphragmatic hernia without obstruction or gangrene: Secondary | ICD-10-CM | POA: Diagnosis present

## 2019-05-08 DIAGNOSIS — J9601 Acute respiratory failure with hypoxia: Secondary | ICD-10-CM

## 2019-05-08 DIAGNOSIS — Z9119 Patient's noncompliance with other medical treatment and regimen: Secondary | ICD-10-CM

## 2019-05-08 DIAGNOSIS — R339 Retention of urine, unspecified: Secondary | ICD-10-CM | POA: Diagnosis not present

## 2019-05-08 DIAGNOSIS — Z978 Presence of other specified devices: Secondary | ICD-10-CM

## 2019-05-08 DIAGNOSIS — J150 Pneumonia due to Klebsiella pneumoniae: Secondary | ICD-10-CM | POA: Diagnosis not present

## 2019-05-08 DIAGNOSIS — J80 Acute respiratory distress syndrome: Secondary | ICD-10-CM

## 2019-05-08 DIAGNOSIS — Z0189 Encounter for other specified special examinations: Secondary | ICD-10-CM

## 2019-05-08 DIAGNOSIS — D473 Essential (hemorrhagic) thrombocythemia: Secondary | ICD-10-CM | POA: Diagnosis not present

## 2019-05-08 DIAGNOSIS — Z820 Family history of epilepsy and other diseases of the nervous system: Secondary | ICD-10-CM

## 2019-05-08 DIAGNOSIS — R509 Fever, unspecified: Secondary | ICD-10-CM

## 2019-05-08 DIAGNOSIS — N179 Acute kidney failure, unspecified: Secondary | ICD-10-CM | POA: Diagnosis present

## 2019-05-08 DIAGNOSIS — Z9689 Presence of other specified functional implants: Secondary | ICD-10-CM

## 2019-05-08 DIAGNOSIS — U071 COVID-19: Secondary | ICD-10-CM | POA: Diagnosis present

## 2019-05-08 DIAGNOSIS — J8410 Pulmonary fibrosis, unspecified: Secondary | ICD-10-CM | POA: Diagnosis not present

## 2019-05-08 DIAGNOSIS — E876 Hypokalemia: Secondary | ICD-10-CM | POA: Diagnosis not present

## 2019-05-08 DIAGNOSIS — R7303 Prediabetes: Secondary | ICD-10-CM | POA: Diagnosis present

## 2019-05-08 DIAGNOSIS — Z79890 Hormone replacement therapy (postmenopausal): Secondary | ICD-10-CM

## 2019-05-08 DIAGNOSIS — Z79891 Long term (current) use of opiate analgesic: Secondary | ICD-10-CM

## 2019-05-08 DIAGNOSIS — K219 Gastro-esophageal reflux disease without esophagitis: Secondary | ICD-10-CM | POA: Diagnosis present

## 2019-05-08 DIAGNOSIS — F329 Major depressive disorder, single episode, unspecified: Secondary | ICD-10-CM | POA: Diagnosis present

## 2019-05-08 DIAGNOSIS — J90 Pleural effusion, not elsewhere classified: Secondary | ICD-10-CM

## 2019-05-08 DIAGNOSIS — F40240 Claustrophobia: Secondary | ICD-10-CM | POA: Diagnosis present

## 2019-05-08 DIAGNOSIS — I700 Atherosclerosis of aorta: Secondary | ICD-10-CM | POA: Diagnosis present

## 2019-05-08 DIAGNOSIS — Z9911 Dependence on respirator [ventilator] status: Secondary | ICD-10-CM

## 2019-05-08 DIAGNOSIS — E870 Hyperosmolality and hypernatremia: Secondary | ICD-10-CM | POA: Diagnosis not present

## 2019-05-08 DIAGNOSIS — Z7982 Long term (current) use of aspirin: Secondary | ICD-10-CM

## 2019-05-08 DIAGNOSIS — J439 Emphysema, unspecified: Secondary | ICD-10-CM | POA: Diagnosis present

## 2019-05-08 DIAGNOSIS — Z930 Tracheostomy status: Secondary | ICD-10-CM

## 2019-05-08 DIAGNOSIS — E872 Acidosis: Secondary | ICD-10-CM | POA: Diagnosis not present

## 2019-05-08 DIAGNOSIS — J14 Pneumonia due to Hemophilus influenzae: Secondary | ICD-10-CM | POA: Diagnosis not present

## 2019-05-08 DIAGNOSIS — Z66 Do not resuscitate: Secondary | ICD-10-CM | POA: Diagnosis not present

## 2019-05-08 DIAGNOSIS — J939 Pneumothorax, unspecified: Secondary | ICD-10-CM

## 2019-05-08 LAB — CBC WITH DIFFERENTIAL/PLATELET
Abs Immature Granulocytes: 0.33 10*3/uL — ABNORMAL HIGH (ref 0.00–0.07)
Abs Immature Granulocytes: 0.69 10*3/uL — ABNORMAL HIGH (ref 0.00–0.07)
Basophils Absolute: 0 10*3/uL (ref 0.0–0.1)
Basophils Absolute: 0.1 10*3/uL (ref 0.0–0.1)
Basophils Relative: 0 %
Basophils Relative: 0 %
Eosinophils Absolute: 0 10*3/uL (ref 0.0–0.5)
Eosinophils Absolute: 0 10*3/uL (ref 0.0–0.5)
Eosinophils Relative: 0 %
Eosinophils Relative: 0 %
HCT: 41.7 % (ref 39.0–52.0)
HCT: 45.9 % (ref 39.0–52.0)
Hemoglobin: 13.5 g/dL (ref 13.0–17.0)
Hemoglobin: 14.8 g/dL (ref 13.0–17.0)
Immature Granulocytes: 3 %
Immature Granulocytes: 5 %
Lymphocytes Relative: 18 %
Lymphocytes Relative: 8 %
Lymphs Abs: 2 10*3/uL (ref 0.7–4.0)
Lymphs Abs: 1.1 10*3/uL (ref 0.7–4.0)
MCH: 30.2 pg (ref 26.0–34.0)
MCH: 30.5 pg (ref 26.0–34.0)
MCHC: 32.4 g/dL (ref 30.0–36.0)
MCHC: 32.2 g/dL (ref 30.0–36.0)
MCV: 93.3 fL (ref 80.0–100.0)
MCV: 94.6 fL (ref 80.0–100.0)
Monocytes Absolute: 0.5 10*3/uL (ref 0.1–1.0)
Monocytes Absolute: 0.7 10*3/uL (ref 0.1–1.0)
Monocytes Relative: 4 %
Monocytes Relative: 5 %
Neutro Abs: 8.5 10*3/uL — ABNORMAL HIGH (ref 1.7–7.7)
Neutro Abs: 11.9 10*3/uL — ABNORMAL HIGH (ref 1.7–7.7)
Neutrophils Relative %: 75 %
Neutrophils Relative %: 82 %
Platelets: 437 10*3/uL — ABNORMAL HIGH (ref 150–400)
Platelets: 464 10*3/uL — ABNORMAL HIGH (ref 150–400)
RBC: 4.47 MIL/uL (ref 4.22–5.81)
RBC: 4.85 MIL/uL (ref 4.22–5.81)
RDW: 13.2 % (ref 11.5–15.5)
RDW: 13.3 % (ref 11.5–15.5)
WBC: 11.4 10*3/uL — ABNORMAL HIGH (ref 4.0–10.5)
WBC: 14.4 10*3/uL — ABNORMAL HIGH (ref 4.0–10.5)
nRBC: 0.7 % — ABNORMAL HIGH (ref 0.0–0.2)
nRBC: 0.6 % — ABNORMAL HIGH (ref 0.0–0.2)

## 2019-05-08 LAB — C-REACTIVE PROTEIN
CRP: 1.7 mg/dL — ABNORMAL HIGH (ref <1.0)
CRP: 1.9 mg/dL — ABNORMAL HIGH (ref <1.0)

## 2019-05-08 LAB — CULTURE, BLOOD (ROUTINE X 2)
Culture: NO GROWTH
Culture: NO GROWTH
Special Requests: ADEQUATE
Special Requests: ADEQUATE

## 2019-05-08 LAB — FERRITIN: Ferritin: 363 ng/mL — ABNORMAL HIGH (ref 24–336)

## 2019-05-08 LAB — COMPREHENSIVE METABOLIC PANEL
ALT: 115 U/L — ABNORMAL HIGH (ref 0–44)
ALT: 172 U/L — ABNORMAL HIGH (ref 0–44)
AST: 97 U/L — ABNORMAL HIGH (ref 15–41)
AST: 123 U/L — ABNORMAL HIGH (ref 15–41)
Albumin: 3 g/dL — ABNORMAL LOW (ref 3.5–5.0)
Albumin: 3.2 g/dL — ABNORMAL LOW (ref 3.5–5.0)
Alkaline Phosphatase: 76 U/L (ref 38–126)
Alkaline Phosphatase: 97 U/L (ref 38–126)
Anion gap: 10 (ref 5–15)
Anion gap: 12 (ref 5–15)
BUN: 27 mg/dL — ABNORMAL HIGH (ref 6–20)
BUN: 30 mg/dL — ABNORMAL HIGH (ref 6–20)
CO2: 27 mmol/L (ref 22–32)
CO2: 21 mmol/L — ABNORMAL LOW (ref 22–32)
Calcium: 9.1 mg/dL (ref 8.9–10.3)
Calcium: 8.8 mg/dL — ABNORMAL LOW (ref 8.9–10.3)
Chloride: 106 mmol/L (ref 98–111)
Chloride: 104 mmol/L (ref 98–111)
Creatinine, Ser: 1.3 mg/dL — ABNORMAL HIGH (ref 0.61–1.24)
Creatinine, Ser: 1.37 mg/dL — ABNORMAL HIGH (ref 0.61–1.24)
GFR calc Af Amer: >60 mL/min (ref >60)
GFR calc Af Amer: >60 mL/min (ref >60)
GFR calc non Af Amer: >60 mL/min (ref >60)
GFR calc non Af Amer: 56 mL/min — ABNORMAL LOW (ref >60)
Glucose, Bld: 110 mg/dL — ABNORMAL HIGH (ref 70–99)
Glucose, Bld: 114 mg/dL — ABNORMAL HIGH (ref 70–99)
Potassium: 4.4 mmol/L (ref 3.5–5.1)
Potassium: 4.8 mmol/L (ref 3.5–5.1)
Sodium: 143 mmol/L (ref 135–145)
Sodium: 137 mmol/L (ref 135–145)
Total Bilirubin: 0.7 mg/dL (ref 0.3–1.2)
Total Bilirubin: 1.2 mg/dL (ref 0.3–1.2)
Total Protein: 6 g/dL — ABNORMAL LOW (ref 6.5–8.1)
Total Protein: 6.2 g/dL — ABNORMAL LOW (ref 6.5–8.1)

## 2019-05-08 LAB — MAGNESIUM: Magnesium: 2.6 mg/dL — ABNORMAL HIGH (ref 1.7–2.4)

## 2019-05-08 LAB — TYPE AND SCREEN
ABO/RH(D): A POS
Antibody Screen: NEGATIVE

## 2019-05-08 LAB — TROPONIN I (HIGH SENSITIVITY): Troponin I (High Sensitivity): 7 ng/L (ref <18)

## 2019-05-08 LAB — D-DIMER, QUANTITATIVE
D-Dimer, Quant: 1.22 ug/mL-FEU — ABNORMAL HIGH (ref 0.00–0.50)
D-Dimer, Quant: 4.94 ug/mL-FEU — ABNORMAL HIGH (ref 0.00–0.50)

## 2019-05-08 LAB — BRAIN NATRIURETIC PEPTIDE: B Natriuretic Peptide: 33 pg/mL (ref 0.0–100.0)

## 2019-05-08 LAB — PROCALCITONIN: Procalcitonin: <0.1 ng/mL

## 2019-05-08 MED ORDER — CLONAZEPAM 0.25 MG PO TBDP: 1.0000 mg | ORAL_TABLET | Freq: Two times a day (BID) | ORAL | Status: DC

## 2019-05-08 MED ORDER — CLONAZEPAM 0.25 MG PO TBDP
1.0000 mg | ORAL_TABLET | Freq: Two times a day (BID) | ORAL | Status: DC | PRN
  Administered 2019-05-08: 12:00:00 1 mg via ORAL
  Filled 2019-05-08: qty 4

## 2019-05-08 MED ORDER — HYDROCODONE-ACETAMINOPHEN 10-325 MG PO TABS
1.0000 | ORAL_TABLET | Freq: Four times a day (QID) | ORAL | Status: DC | PRN
  Administered 2019-05-09 – 2019-05-10 (×4): 1 via ORAL
  Filled 2019-05-08 (×4): qty 1

## 2019-05-08 MED ORDER — LACTATED RINGERS IV SOLN: INTRAVENOUS | Status: DC

## 2019-05-08 MED ORDER — ALPRAZOLAM 0.5 MG PO TABS
1.0000 mg | ORAL_TABLET | Freq: Three times a day (TID) | ORAL | Status: DC | PRN
  Administered 2019-05-08: 1 mg via ORAL
  Filled 2019-05-08: qty 2

## 2019-05-08 MED ORDER — METHYLPREDNISOLONE SODIUM SUCC 125 MG IJ SOLR: 60.0000 mg | Freq: Every day | INTRAMUSCULAR | Status: DC

## 2019-05-08 MED ORDER — DEXAMETHASONE SODIUM PHOSPHATE 10 MG/ML IJ SOLN
6.0000 mg | Freq: Once | INTRAMUSCULAR | Status: AC
  Administered 2019-05-08: 22:00:00 6 mg via INTRAVENOUS
  Filled 2019-05-08: qty 1

## 2019-05-08 NOTE — Progress Notes (Addendum)
PROGRESS NOTE                                                                                                                                                                                                             Patient Demographics:    Matthew Eaton, is a 59 y.o. male, DOB - 1960/02/23, GNF:[id number]  Outpatient Primary MD for the patient is Copland, Frederico Hamman, MD    LOS - 5  Admit date - 04/12/2019    Chief Complaint  Patient presents with  . Shortness of Breath       Brief Narrative - Matthew Eaton is a 59 y.o. male with medical history significant of hyperlipidemia, chronic pain syndrome, former smoker, GERD, depression presents to emergency department due to shortness of breath with exertion since 2 days, was diagnosed with acute hypoxic respiratory failure due to COVID-19 pneumonia and admitted to the hospital.   Subjective:   Patient in bed, appears comfortable, denies any headache, no fever, no chest pain or pressure, no shortness of breath , no abdominal pain. No focal weakness.   Assessment  & Plan :    1. Acute Hypoxic Resp. Failure due to Acute Covid 19 Viral Pneumonitis during the ongoing 2020 Covid 19 Pandemic - he has severe disease although his CRP is not reflective of that, he has hypoxia and large AA gradient, he had been sick for a while before he came to the hospital, was promptly started on steroids and remdesivir, received Actemra on 05/04/2019, oxygen requirements gradually coming down from 15 L/min to 10 L on 05/07/2019 but still overall quite hypoxic, continue to monitor closely, negative CTA chest.  He has been adequately diuresed however he continues to be overall quite hypoxic, will check echocardiogram as well.  Encouraged the patient to sit up in chair in the daytime use I-S and flutter valve for pulmonary toiletry and then prone in bed when at night.  Will advance activity and titrate down  oxygen as possible.  He is reluctant to sit in the recliner.  SpO2: 90 % O2 Flow Rate (L/min): 10 L/min   Recent Labs  Lab 05/04/19 0424 05/05/19 0452 05/06/19 0244 05/07/19 0611 05/22/2019 0323  CRP 4.8* 2.0* 1.0* 1.0* 1.7*  DDIMER 0.61* 0.37 0.49 0.52* 1.22*  BNP 11.2 40.5 59.4 32.7 33.0  PROCALCITON <0.10 <0.10 <0.10 <  0.10 <0.10    Hepatic Function Latest Ref Rng & Units 05/05/2019 05/07/2019 05/06/2019  Total Protein 6.5 - 8.1 g/dL 6.0(L) 5.8(L) 5.6(L)  Albumin 3.5 - 5.0 g/dL 3.0(L) 2.9(L) 2.6(L)  AST 15 - 41 U/L 97(H) 85(H) 84(H)  ALT 0 - 44 U/L 115(H) 96(H) 66(H)  Alk Phosphatase 38 - 126 U/L 76 77 72  Total Bilirubin 0.3 - 1.2 mg/dL 0.7 0.4 0.4  Bilirubin, Direct 0.0 - 0.2 mg/dL - - -    2.  COVID-19 infection related moderate asymptomatic transaminitis.  Stable, stable right upper quadrant ultrasound, trend.  3.  Dyslipidemia.  Continue home dose statin.  4.  History of anxiety and depression.  On multiple medications including Celexa, Wellbutrin, nortriptyline and as needed Klonopin.  Continued.  Patient is noncompliant with medications and repeatedly rude to other staff.  Counseled him and his wife.  5.  Chronic neck pain.  On chronic narcotics.  Minimize dose last best as we can.  He is currently in no distress.  He had an episode where he looks like he was withdrawing from narcotics on 05/07/2019, relaxed after getting IV morphine.  He is currently on oral narcotics, IV for breakthrough, scheduled twice daily Klonopin along with Xanax as needed for breakthrough anxiety.  I asked the patient how much narcotics he takes at home and he says unlocked, he says I take only what I am being prescribed.      Condition - Extremely Guarded  Family Communication  : [name] - [phone] on 05/04/19, 05/06/19, 06/01/2019  Code Status :  Full  Diet :   Diet Order            Diet regular Room service appropriate? Yes; Fluid consistency: Thin  Diet effective now                Disposition Plan  : In the hospital and complete treatment with IV remdesivir and steroids, Actemra, CTA to rule out PE.  Still severely hypoxic.  Consults  :  None  Procedures  :    RUQ [place] - stable  CTA - 1. No evidence of pulmonary embolism. 2. Multifocal pneumonia, consistent with history of COVID-19 infection. 3. Hiatal hernia.   PUD Prophylaxis : PPI  DVT Prophylaxis  :  Lovenox    Lab Results  Component Value Date   PLT 437 (H) 05/28/2019    Inpatient Medications  Scheduled Meds: . vitamin C  500 mg Oral Daily  . aspirin EC  81 mg Oral Daily  . atorvastatin  80 mg Oral Q supper  . buPROPion  300 mg Oral Daily  . citalopram  40 mg Oral QHS  . clonazepam  1 mg Oral BID  . enoxaparin (LOVENOX) injection  50 mg Subcutaneous Q24H  . mouth rinse  15 mL Mouth Rinse BID  . [START ON 05/09/2019] methylPREDNISolone (SOLU-MEDROL) injection  60 mg Intravenous Daily  . pantoprazole  40 mg Oral Daily  . zinc sulfate  220 mg Oral Daily   Continuous Infusions: . lactated ringers 125 mL/hr at 05/10/2019 1127   PRN Meds:.acetaminophen, ALPRAZolam, guaiFENesin-dextromethorphan, HYDROcodone-acetaminophen, ipratropium-albuterol, morphine injection, [DISCONTINUED] ondansetron **OR** ondansetron (ZOFRAN) IV, phenol, sodium chloride  Antibiotics  :    Anti-infectives (From admission, onward)   Start     Dose/Rate Route Frequency Ordered Stop   05/04/19 1000  remdesivir 100 mg in sodium chloride 0.9 % 100 mL IVPB     100 mg 200 mL/hr over 30 Minutes Intravenous Daily  04/08/2019 1753 05/07/19 1114   04/20/2019 1800  remdesivir 200 mg in sodium chloride 0.9% 250 mL IVPB     200 mg 580 mL/hr over 30 Minutes Intravenous Once 04/06/2019 1753 04/22/2019 1922       Time Spent in minutes  30   Lala Lund M.D on 05/29/2019 at 2:03 PM  To page go to [email or web address] - password [facility]  [facility] -  Office  [phone]    See all Orders from today for further details    Objective:     Vitals:   05/26/2019 0441 05/05/2019 0739 05/31/2019 0852 05/14/2019 1143  BP:  125/76  123/73  Pulse: 73 74  77  Resp: (!) 21 (!) 25  (!) 23  Temp:  (!) 97.5 F (36.4 C)  98.3 F (36.8 C)  TempSrc:  Axillary  Oral  SpO2: 90% (!) 87% 90% 90%  Weight:      Height:        Wt Readings from Last 3 Encounters:  04/14/2019 99.2 kg  04/06/2019 99.8 kg  04/03/19 105 kg     Intake/Output Summary (Last 24 hours) at 05/05/2019 1403 Last data filed at 05/06/2019 1156 Gross per 24 hour  Intake 297.56 ml  Output --  Net 297.56 ml     Physical Exam  Awake Alert, No new F.N deficits, Normal affect .AT,PERRAL Supple Neck,No JVD, No cervical lymphadenopathy appriciated.  Symmetrical Chest wall movement, Good air movement bilaterally, CTAB RRR,No Gallops, Rubs or new Murmurs, No Parasternal Heave +ve B.Sounds, Abd Soft, No tenderness, No organomegaly appriciated, No rebound - guarding or rigidity. No Cyanosis, Clubbing or edema, No new Rash or bruise     Data Review:    CBC Recent Labs  Lab 05/04/19 0424 05/05/19 0452 05/06/19 0244 05/07/19 0611 05/23/2019 0323  WBC 3.6* 5.5 9.5 11.8* 11.4*  HGB 12.4* 12.1* 11.8* 12.8* 13.5  HCT 38.2* 38.1* 37.1* 40.0 41.7  PLT 209 296 328 378 437*  MCV 92.3 93.6 93.9 93.0 93.3  MCH 30.0 29.7 29.9 29.8 30.2  MCHC 32.5 31.8 31.8 32.0 32.4  RDW 13.2 13.2 13.2 13.2 13.2  LYMPHSABS 0.7 0.9 1.3 1.6 2.0  MONOABS 0.3 0.6 0.9 0.9 0.5  EOSABS 0.0 0.0 0.0 0.0 0.0  BASOSABS 0.0 0.0 0.0 0.0 0.0    Chemistries  Recent Labs  Lab 04/15/2019 1614 04/24/2019 1715 05/04/19 0424 05/05/19 0452 05/06/19 0244 05/07/19 0611 05/06/2019 0323  NA  --   --  141 142 143 141 143  K  --   --  3.8 3.8 4.4 4.2 4.4  CL  --   --  104 105 108 107 106  CO2  --   --  _0 GLUCOSE  --   --  132* 128* 134* 144* 110*  BUN  --   --  16 24* 29* 26* 27*  CREATININE  --    < > 1.09 1.17 1.18 1.21 1.30*  CALCIUM  --   --  8.6* 9.0 9.0 8.8* 9.1  AST  --   --  68* 90*  84* 85* 97*  ALT  --   --  57* 66* 66* 96* 115*  ALKPHOS  --   --  79 76 72 77 76  BILITOT  --   --  0.5 0.4 0.4 0.4 0.7  MG  --   --  2.3 2.4 2.4 2.5* 2.6*  INR 0.9  --   --   --   --   --   --    < > =  values in this interval not displayed.     ------------------------------------------------------------------------------------------------------------------ No results for input(s): CHOL, HDL, LDLCALC, TRIG, CHOLHDL, LDLDIRECT in the last 72 hours.  Lab Results  Component Value Date   HGBA1C 6.1 (A) 01/02/2019   ------------------------------------------------------------------------------------------------------------------ No results for input(s): TSH, T4TOTAL, T3FREE, THYROIDAB in the last 72 hours.  Invalid input(s): FREET3  Cardiac Enzymes No results for input(s): CKMB, TROPONINI, MYOGLOBIN in the last 168 hours.  Invalid input(s): CK ------------------------------------------------------------------------------------------------------------------    Component Value Date/Time   BNP 33.0 05/28/2019 0323    Micro Results Recent Results (from the past 240 hour(s))  Blood Culture (routine x 2)     Status: None   Collection Time: 04/28/2019  4:35 PM   Specimen: BLOOD  Result Value Ref Range Status   Specimen Description BLOOD RIGHT ANTECUBITAL  Final   Special Requests   Final    BOTTLES DRAWN AEROBIC AND ANAEROBIC Blood Culture adequate volume   Culture   Final    NO GROWTH 5 DAYS Performed at [facility], [address] [place], [place] [address]    Report Status 05/14/2019 FINAL  Final  Blood Culture (routine x 2)     Status: None   Collection Time: 04/19/2019  4:51 PM   Specimen: BLOOD  Result Value Ref Range Status   Specimen Description BLOOD LEFT ANTECUBITAL  Final   Special Requests   Final    BOTTLES DRAWN AEROBIC AND ANAEROBIC Blood Culture adequate volume   Culture   Final    NO GROWTH 5 DAYS Performed at [facility], [place] [address],  [place], [place] [address]    Report Status 05/22/2019 FINAL  Final    Radiology Reports DG Chest 2 View  Result Date: 04/12/2019 CLINICAL DATA:  Shortness of breath EXAM: CHEST - 2 VIEW COMPARISON:  March 03, 2009 FINDINGS: There is ill-defined airspace consolidation in the left mid lung and left lower lung regions. There is also subtle opacity in the right perihilar region. There is mild scarring in the right base. Heart size and pulmonary vascularity are normal. No adenopathy. Postoperative change noted in lower cervical region. IMPRESSION: Ill-defined airspace opacity left mid lung and left lower lung regions as well as more subtly in the right perihilar region. Appearance consistent with multifocal pneumonia. Mild atelectasis right base. Heart size normal.  No evident adenopathy. Electronically Signed   By: Lowella Grip III M.D.   On: 04/04/2019 13:25   CT ANGIO CHEST PE W OR WO CONTRAST  Result Date: 05/05/2019 CLINICAL DATA:  Shortness of breath. COVID positive. EXAM: CT ANGIOGRAPHY CHEST WITH CONTRAST TECHNIQUE: Multidetector CT imaging of the chest was performed using the standard protocol during bolus administration of intravenous contrast. Multiplanar CT image reconstructions and MIPs were obtained to evaluate the vascular anatomy. CONTRAST:  142m OMNIPAQUE IOHEXOL 350 MG/ML SOLN COMPARISON:  Chest x-ray from same day. FINDINGS: Cardiovascular: Satisfactory opacification of the pulmonary arteries to the segmental level. No evidence of pulmonary embolism. Normal heart size. No pericardial effusion. No thoracic aortic aneurysm or dissection. Mediastinum/Nodes: No enlarged mediastinal, hilar, or axillary lymph nodes. Thyroid gland, trachea, and esophagus demonstrate no significant findings. Lungs/Pleura: Patchy bilateral somewhat confluent ground-glass densities with areas of septal thickening in the left greater than right upper and lower lobes. Areas of subpleural banding and architectural  distortion in the right lung. No pleural effusion or pneumothorax. Upper Abdomen: No acute abnormality. Small to moderate hiatal hernia. Musculoskeletal: No chest wall abnormality. No acute  or significant osseous findings. Review of the MIP images confirms the above findings. IMPRESSION: 1. No evidence of pulmonary embolism. 2. Multifocal pneumonia, consistent with history of COVID-19 infection. 3. Hiatal hernia. Electronically Signed   By: Titus Dubin M.D.   On: 05/05/2019 12:36   DG Chest Port 1 View  Result Date: 05/26/2019 CLINICAL DATA:  Shortness of breath. COVID pneumonia. EXAM: PORTABLE CHEST 1 VIEW COMPARISON:  Chest x-ray 05/05/2019 FINDINGS: The cardiac silhouette, mediastinal and hilar contours are stable. Persistent patchy ill-defined interstitial and airspace infiltrates in both lungs, left worse than right. No definite pleural effusions. IMPRESSION: Persistent bilateral infiltrates, left worse than right. Electronically Signed   By: Marijo Sanes M.D.   On: 06/01/2019 08:06   DG Chest Port 1 View  Result Date: 05/05/2019 CLINICAL DATA:  Shortness of breath.  COVID positive. EXAM: PORTABLE CHEST 1 VIEW COMPARISON:  04/29/2019. FINDINGS: Mediastinum and hilar structures normal. Progressive infiltrate noted throughout the left lung. Mild infiltrate in the right upper lung. Bilateral subsegmental atelectasis. No pleural effusion or pneumothorax. Mild cardiomegaly. No pulmonary venous congestion. Sliding hiatal hernia. Thoracic spine scoliosis. No acute bony abnormality IMPRESSION: Progressive infiltrate noted throughout the left lung. Mild infiltrate right upper lung. Bilateral subsegmental atelectasis. Electronically Signed   By: Marcello Moores  Register   On: 05/05/2019 08:39   US Abdomen Limited RUQ  Result Date: 04/03/2019 CLINICAL DATA:  Elevated LFTs EXAM: ULTRASOUND ABDOMEN LIMITED RIGHT UPPER QUADRANT COMPARISON:  None. FINDINGS: Gallbladder: No gallstones or wall thickening visualized. No  sonographic Murphy sign noted by sonographer. Common bile duct: Not well visualized Liver: Diffusely increased in echogenicity consistent with fatty infiltration. Portal vein is patent on color Doppler imaging with normal direction of blood flow towards the liver. Other: None. IMPRESSION: Common bile duct is not visualized on this exam although no intrahepatic ductal dilatation is seen. Normal-appearing gallbladder. Fatty liver. Electronically Signed   By: Inez Catalina M.D.   On: 04/26/2019 23:52

## 2019-05-08 NOTE — Progress Notes (Signed)
Patient leaving AMA. Wife called informed if patient leaves he will die. Patient aware, alert and oriented and able to make this decision. MD called and also spoke with patient. IVs removed. Belongings returned.

## 2019-05-08 NOTE — ED Provider Notes (Signed)
[facility] EMERGENCY DEPARTMENT Provider Note   CSN: [id number] Arrival date & time: 05/26/2019  2127     History Chief Complaint  Patient presents with  . Shortness of Breath    Matthew Eaton is a 59 y.o. male.  HPI   59 year old male with a past medical history of HLD, MDD, former tobacco abuse, recent diagnosis of Covid pneumonia who left the hospital AMA this morning presenting to the emergency department complaining of shortness of breath and hypoxia.  Patient arrives brought in by EMS with concerns for hypoxia down into the 50s on room air.  Patient was placed on nonrebreather with improvement in saturations into the high 80s subsequently the low 90s.  Patient reports that he left the hospital due to his displeasure with being on CPAP.  Patient initially presented to the hospital 5 days ago on 3/31 complaining of shortness of breath and hypoxia.  The patient was found to have multifocal Covid pneumonia requiring oxygen supplementation.  Past Medical History:  Diagnosis Date  . Chronic neck pain 05/20/2012  . Family hx of ALS (amyotrophic lateral sclerosis) 05/27/2013  . GERD (gastroesophageal reflux disease)   . Hiatal hernia   . HLD (hyperlipidemia)   . Major depressive disorder, recurrent episode, moderate ([place]) 05/27/2013  . Neck pain    cervical spine with herniation  . Tobacco abuse   . Wrist fracture, left     Patient Active Problem List   Diagnosis Date Noted  . Acute hypoxemic respiratory failure due to COVID-19 ([place]) 05/02/2019  . Multifocal pneumonia 04/30/2019  . Elevated liver enzymes 04/03/2019  . Chronic, continuous use of opioids 12/24/2017  . Diabetes mellitus type 2, uncomplicated ([place]) 99991111  . 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 Chronic bilateral low back pain without sciatica 06/04/2016  . Vitamin B12 deficiency 10/15/2015  . Family hx of ALS (amyotrophic lateral sclerosis) 05/27/2013  . Major depressive disorder, recurrent  episode, moderate ([place]) 05/27/2013  . Obesity (BMI 30-39.9) 05/22/2013  . Insomnia 05/20/2012  . Hiatal hernia 01/05/2011  . [place] DISEASE, CERVICAL 01/22/2010  . TOBACCO ABUSE, HX OF 01/22/2010  . Generalized anxiety disorder 03/06/2009  . Hypogonadism in male 12/19/2008  . Hyperlipidemia 11/15/2008  . GERD 11/15/2008    Past Surgical History:  Procedure Laterality Date  . NECK SURGERY     x2- 2010, 2011       Family History  Problem Relation Age of Onset  . ALS Mother   . ALS Brother   . ALS Maternal Uncle   . ALS Maternal Aunt   . Colon cancer Neg Hx   . Esophageal cancer Neg Hx   . Rectal cancer Neg Hx   . Stomach cancer Neg Hx     Social History   Tobacco Use  . Smoking status: Former Smoker    Years: 30.00    Quit date: 02/03/2007    Years since quitting: 12.2  . Smokeless tobacco: Never Used  Substance Use Topics  . Alcohol use: No  . Drug use: No    Home Medications Prior to Admission medications   Medication Sig Start Date End Date Taking? Authorizing Provider  aspirin 81 MG tablet Take 81 mg by mouth daily.      [provider]  atorvastatin (LIPITOR) 80 MG tablet TAKE 1 TABLET BY MOUTH DAILY AT 6 PM. Patient taking differently: Take 80 mg by mouth daily with supper.  09/12/18   Copland, Frederico Hamman, MD  buPROPion (WELLBUTRIN XL) 300  MG 24 hr tablet TAKE 1 TABLET BY MOUTH DAILY. Patient taking differently: Take 300 mg by mouth daily.  01/24/19   Copland, Frederico Hamman, MD  Cholecalciferol (VITAMIN D3) 50 MCG (2000 UT) TABS Take 2,000 Units by mouth daily with breakfast.    [provider]  citalopram (CELEXA) 40 MG tablet TAKE 1 TABLET BY MOUTH DAILY. Patient taking differently: Take 40 mg by mouth at bedtime.  01/09/19   Copland, Frederico Hamman, MD  clonazePAM (KLONOPIN) 0.5 MG tablet TAKE 1 TABLET BY MOUTH TWICE A DAY AS NEEDED Patient taking differently: Take 0.5 mg by mouth in the morning and at bedtime.  11/28/18   Copland, Frederico Hamman, MD    HYDROcodone-acetaminophen (NORCO) 10-325 MG tablet TAKE 1 TABLET BY MOUTH EVERY 6 HOURS AS NEEDED FOR SEVERE PAIN. Patient not taking: Reported on 04/28/2019 04/03/19   Copland, Frederico Hamman, MD  HYDROcodone-acetaminophen ([place]) 10-325 MG tablet TAKE 1 TABLET BY MOUTH EVERY 6 HOURS AS NEEDED FOR SEVERE PAIN Patient taking differently: Take 0.5-1 tablets by mouth every 6 (six) hours as needed for severe pain.  04/03/19   Copland, Frederico Hamman, MD  HYDROcodone-acetaminophen (NORCO) 10-325 MG tablet Take 1 tablet by mouth every 6 (six) hours as needed for severe pain. Patient not taking: Reported on 04/22/2019 04/03/19   Owens Loffler, MD  ibuprofen (ADVIL) 200 MG tablet Take 400 mg by mouth every 6 (six) hours as needed (for headaches).    [provider]  Melatonin 10 MG TABS Take 10-20 mg by mouth at bedtime as needed (for sleep).    [provider]  nortriptyline (PAMELOR) 25 MG capsule Take 1 capsule (25 mg total) by mouth at bedtime. Patient not taking: Reported on 05/02/2019 05/04/18   Copland, Frederico Hamman, MD  omeprazole (PRILOSEC) 20 MG capsule TAKE 1 CAPSULE BY MOUTH 2 TIMES DAILY BEFORE A MEAL. Patient taking differently: Take 20 mg by mouth 2 (two) times daily before a meal.  09/16/18   Copland, Frederico Hamman, MD    Allergies    Gabapentin and Methocarbamol  Review of Systems   Review of Systems  Constitutional: Positive for activity change. Negative for appetite change, chills, diaphoresis, fatigue and fever.  HENT: Negative for congestion and rhinorrhea.   Respiratory: Positive for cough and shortness of breath. Negative for wheezing.   Cardiovascular: Negative for chest pain.  Gastrointestinal: Negative for abdominal distention, abdominal pain, diarrhea, nausea and vomiting.  Genitourinary: Negative for decreased urine volume, difficulty urinating, dysuria, frequency and urgency.  Musculoskeletal: Negative for gait problem.  Skin: Negative for color change and wound.  Neurological:  Negative for dizziness, syncope, weakness, light-headedness and headaches.  All other systems reviewed and are negative.   Physical Exam Updated Vital Signs BP 130/90   Pulse 90   Resp (!) 28   Ht 5\' 10"  (1.778 m)   Wt 113.4 kg   SpO2 91%   BMI 35.87 kg/m   Physical Exam Vitals and nursing note reviewed.  Constitutional:      General: He is not in acute distress.    Appearance: Normal appearance. He is normal weight. He is ill-appearing.  HENT:     Head: Normocephalic.     Right Ear: External ear normal.     Left Ear: External ear normal.     Nose: Nose normal.     Mouth/Throat:     Mouth: Mucous membranes are moist.     Pharynx: Oropharynx is clear.  Eyes:     Extraocular Movements: Extraocular movements intact.  Cardiovascular:  Rate and Rhythm: Normal rate and regular rhythm.     Pulses: Normal pulses.     Heart sounds: Normal heart sounds.  Pulmonary:     Effort: Tachypnea and respiratory distress present. No accessory muscle usage.     Breath sounds: Examination of the right-middle field reveals decreased breath sounds. Examination of the left-middle field reveals decreased breath sounds. Examination of the right-lower field reveals decreased breath sounds. Examination of the left-lower field reveals decreased breath sounds. Decreased breath sounds present. No wheezing or rhonchi.  Abdominal:     General: Bowel sounds are normal.     Palpations: Abdomen is soft.     Tenderness: There is no abdominal tenderness. There is no guarding.  Musculoskeletal:     Cervical back: Normal range of motion.     Right lower leg: No edema.     Left lower leg: No edema.  Skin:    General: Skin is warm and dry.     Capillary Refill: Capillary refill takes less than 2 seconds.  Neurological:     General: No focal deficit present.     Mental Status: He is alert and oriented to person, place, and time. Mental status is at baseline.  Psychiatric:        Mood and Affect: Mood  normal.     ED Results / Procedures / Treatments   Labs (all labs ordered are listed, but only abnormal results are displayed) Labs Reviewed  CBC WITH DIFFERENTIAL/PLATELET - Abnormal; Notable for the following components:      Result Value   WBC 14.4 (*)    Platelets 464 (*)    nRBC 0.6 (*)    Neutro Abs 11.9 (*)    Abs Immature Granulocytes 0.69 (*)    All other components within normal limits  D-DIMER, QUANTITATIVE (NOT AT [facility]) - Abnormal; Notable for the following components:   D-Dimer, Quant 4.94 (*)    All other components within normal limits  COMPREHENSIVE METABOLIC PANEL  BRAIN NATRIURETIC PEPTIDE  C-REACTIVE PROTEIN  FERRITIN  TYPE AND SCREEN  TROPONIN I (HIGH SENSITIVITY)    EKG None  Radiology DG Chest Port 1 View  Result Date: 05/12/2019 CLINICAL DATA:  Shortness of breath. COVID pneumonia. EXAM: PORTABLE CHEST 1 VIEW COMPARISON:  Chest x-ray 05/05/2019 FINDINGS: The cardiac silhouette, mediastinal and hilar contours are stable. Persistent patchy ill-defined interstitial and airspace infiltrates in both lungs, left worse than right. No definite pleural effusions. IMPRESSION: Persistent bilateral infiltrates, left worse than right. Electronically Signed   By: Marijo Sanes M.D.   On: 05/09/2019 08:06    Procedures Procedures (including critical care time)  Medications Ordered in ED Medications  dexamethasone (DECADRON) injection 6 mg (6 mg Intravenous Given 05/07/2019 2229)    ED Course  I have reviewed the triage vital signs and the nursing notes.  Pertinent labs & imaging results that were available during my care of the patient were reviewed by me and considered in my medical decision making (see chart for details).    MDM Rules/Calculators/A&P                      59 year old male with a past medical history of HLD, MDD, former tobacco abuse, recent diagnosis of Covid pneumonia who left the hospital AMA this morning presenting to the emergency  department complaining of shortness of breath and hypoxia.   Patient maintaining oxygen saturations in the low to mid 90s on NRB.  Patient is mildly tachycardic  in the low 100s.  Patient appears to be in mild respiratory distress with tachypnea and minimal accessory muscle usage with diminished breath sounds in bilateral bases.  Patient has no significant changes in his status since leaving a.m. this morning apart from increased dyspnea, shortness of breath and hypoxia.  Will consult hospitalist for readmission to their service for acute hypoxic respiratory failure in the setting of multifocal Covid pneumonia.  We will admit to hospitalist for ongoing evaluation, monitoring and management  Care of this patient was discussed with Dr. Ashok Cordia, who directly supervised the care of this patient.  Final Clinical Impression(s) / ED Diagnoses Final diagnoses:  None    Rx / DC Orders ED Discharge Orders    None       Filbert Berthold, MD 06/02/2019 2315    Lajean Saver, MD 05/09/19 1430

## 2019-05-08 NOTE — ED Triage Notes (Signed)
Pt BIB [facility] from home with SOB  Pt was admitted today earlier for COVID but left AMA due to not wanting to be on CPAP. At home, the wife called EMS due to increased SOB.  VSS except for O2, Pt is saturating at 88-92% on 15L NRB.  Tachycardic in the 100s-110s.

## 2019-05-08 NOTE — Discharge Summary (Signed)
AMA  Patient at this time expresses desire to leave the Hospital immidiately, patient has been warned that this is not Medically advisable at this time, and can result in Medical complications like Death and Disability, patient understands and accepts the risks involved and assumes full responsibilty of this decision.   Lala Lund M.D on 05/07/2019 at 6:10 PM  [facility]  Time < 30 minutes  Last Note Below                                                                     PROGRESS NOTE                                                                                                                                                                                                             Patient Demographics:    Matthew Eaton, is a 59 y.o. male, DOB - 1960-12-06, MCR:[id number]  Outpatient Primary MD for the patient is Owens Loffler, MD    LOS - 5  Admit date - 05/01/2019    Chief Complaint  Patient presents with  . Shortness of Breath       Brief Narrative - Matthew Eaton is a 59 y.o. male with medical history significant of hyperlipidemia, chronic pain syndrome, former smoker, GERD, depression presents to emergency department due to shortness of breath with exertion since 2 days, was diagnosed with acute hypoxic respiratory failure due to COVID-19 pneumonia and admitted to the hospital.   Subjective:   Patient in bed, appears comfortable, denies any headache, no fever, no chest pain or pressure, no shortness of breath , no abdominal pain. No focal weakness.   Assessment  & Plan :    1. Acute Hypoxic Resp. Failure due to Acute Covid 19 Viral Pneumonitis during the ongoing 2020 Covid 19 Pandemic - he has severe disease although his CRP is not reflective of that, he has hypoxia and large AA gradient, he had been sick for a  while before he came to the hospital, was promptly started on steroids and remdesivir, received Actemra on 05/04/2019, oxygen requirements gradually coming down from 15 L/min to 10 L on 05/07/2019 but still overall quite hypoxic, continue to monitor closely, negative CTA chest.  He has been adequately diuresed however he continues to be overall quite hypoxic, will check echocardiogram as well.  Encouraged the patient to sit up in chair in the daytime use I-S and flutter valve for pulmonary toiletry and then prone in bed when at night.  Will advance activity and titrate down oxygen as possible.  He is reluctant to sit in the recliner.  SpO2: 91 % O2 Flow Rate (L/min): 6 L/min   Recent Labs  Lab 05/04/19 0424 05/05/19 0452 05/06/19 0244 05/07/19 0611 05/07/2019 0323  CRP 4.8* 2.0* 1.0* 1.0* 1.7*  DDIMER 0.61* 0.37 0.49 0.52* 1.22*  BNP 11.2 40.5 59.4 32.7 33.0  PROCALCITON <0.10 <0.10 <0.10 <0.10 <0.10    Hepatic Function Latest Ref Rng & Units 05/14/2019 05/07/2019 05/06/2019  Total Protein 6.5 - 8.1 g/dL 6.0(L) 5.8(L) 5.6(L)  Albumin 3.5 - 5.0 g/dL 3.0(L) 2.9(L) 2.6(L)  AST 15 - 41 U/L 97(H) 85(H) 84(H)  ALT 0 - 44 U/L 115(H) 96(H) 66(H)  Alk Phosphatase 38 - 126 U/L 76 77 72  Total Bilirubin 0.3 - 1.2 mg/dL 0.7 0.4 0.4  Bilirubin, Direct 0.0 - 0.2 mg/dL - - -    2.  COVID-19 infection related moderate asymptomatic transaminitis.  Stable, stable right upper quadrant ultrasound, trend.  3.  Dyslipidemia.  Continue home dose statin.  4.  History of anxiety and depression.  On multiple medications including Celexa, Wellbutrin, nortriptyline and as needed Klonopin.  Continued.  Patient is noncompliant with medications and repeatedly rude to other staff.  Counseled him and his wife.  5.  Chronic neck pain.  On chronic narcotics.  Minimize dose last best as we can.  He is currently in no distress.  He had an episode where he looks like he was withdrawing from narcotics on 05/07/2019, relaxed after  getting IV morphine.  He is currently on oral narcotics, IV for breakthrough, scheduled twice daily Klonopin along with Xanax as needed for breakthrough anxiety.  I asked the patient how much narcotics he takes at home and he says unlocked, he says I take only what I am being prescribed.      Condition - Extremely Guarded  Family Communication  : [name] - [phone] on 05/04/19, 05/06/19, 05/16/2019  Code Status :  Full  Diet :   Diet Order            Diet regular Room service appropriate? Yes; Fluid consistency: Thin  Diet effective now               Disposition Plan  : In the hospital and complete treatment with IV remdesivir and steroids, Actemra, CTA to rule out PE.  Still severely hypoxic.  Consults  :  None  Procedures  :    RUQ [place] - stable  CTA - 1. No evidence of pulmonary embolism. 2. Multifocal pneumonia, consistent with history of COVID-19 infection. 3. Hiatal hernia.   PUD Prophylaxis : PPI  DVT Prophylaxis  :  Lovenox    Lab Results  Component Value Date   PLT 437 (H) 05/25/2019    Inpatient Medications  Scheduled Meds: . vitamin C  500 mg Oral Daily  . aspirin EC  81 mg Oral Daily  . atorvastatin  80  mg Oral Q supper  . buPROPion  300 mg Oral Daily  . citalopram  40 mg Oral QHS  . clonazepam  1 mg Oral BID  . enoxaparin (LOVENOX) injection  50 mg Subcutaneous Q24H  . mouth rinse  15 mL Mouth Rinse BID  . [START ON 05/09/2019] methylPREDNISolone (SOLU-MEDROL) injection  60 mg Intravenous Daily  . pantoprazole  40 mg Oral Daily  . zinc sulfate  220 mg Oral Daily   Continuous Infusions: . lactated ringers 50 mL/hr at 06/01/2019 1403   PRN Meds:.acetaminophen, ALPRAZolam, guaiFENesin-dextromethorphan, HYDROcodone-acetaminophen, ipratropium-albuterol, morphine injection, [DISCONTINUED] ondansetron **OR** ondansetron (ZOFRAN) IV, phenol, sodium chloride  Antibiotics  :    Anti-infectives (From admission, onward)   Start     Dose/Rate Route  Frequency Ordered Stop   05/04/19 1000  remdesivir 100 mg in sodium chloride 0.9 % 100 mL IVPB     100 mg 200 mL/hr over 30 Minutes Intravenous Daily 05/02/2019 1753 05/07/19 1114   04/25/2019 1800  remdesivir 200 mg in sodium chloride 0.9% 250 mL IVPB     200 mg 580 mL/hr over 30 Minutes Intravenous Once 04/11/2019 1753 04/28/2019 1922       Time Spent in minutes  30   Lala Lund M.D on 05/29/2019 at 6:10 PM  To page go to [email or web address] - password [facility]  [facility] -  Office  [phone]    See all Orders from today for further details    Objective:   Vitals:   05/07/2019 0739 05/20/2019 0852 05/30/2019 1143 05/31/2019 1600  BP: 125/76  123/73 109/81  Pulse: 74  77 82  Resp: (!) 25  (!) 23 (!) 23  Temp: (!) 97.5 F (36.4 C)  98.3 F (36.8 C) (!) 97.5 F (36.4 C)  TempSrc: Axillary  Oral Axillary  SpO2: (!) 87% 90% 90% 91%  Weight:      Height:        Wt Readings from Last 3 Encounters:  04/14/2019 99.2 kg  05/01/2019 99.8 kg  04/03/19 105 kg     Intake/Output Summary (Last 24 hours) at 05/19/2019 1810 Last data filed at 05/24/2019 1500 Gross per 24 hour  Intake 847.87 ml  Output 750 ml  Net 97.87 ml     Physical Exam  Awake Alert, No new F.N deficits, Normal affect [place].AT,PERRAL Supple Neck,No JVD, No cervical lymphadenopathy appriciated.  Symmetrical Chest wall movement, Good air movement bilaterally, CTAB RRR,No Gallops, Rubs or new Murmurs, No Parasternal Heave +ve B.Sounds, Abd Soft, No tenderness, No organomegaly appriciated, No rebound - guarding or rigidity. No Cyanosis, Clubbing or edema, No new Rash or bruise     Data Review:    CBC Recent Labs  Lab 05/04/19 0424 05/05/19 0452 05/06/19 0244 05/07/19 0611 05/05/2019 0323  WBC 3.6* 5.5 9.5 11.8* 11.4*  HGB 12.4* 12.1* 11.8* 12.8* 13.5  HCT 38.2* 38.1* 37.1* 40.0 41.7  PLT 209 296 328 378 437*  MCV 92.3 93.6 93.9 93.0 93.3  MCH 30.0 29.7 29.9 29.8 30.2  MCHC 32.5 31.8 31.8 32.0 32.4  RDW  13.2 13.2 13.2 13.2 13.2  LYMPHSABS 0.7 0.9 1.3 1.6 2.0  MONOABS 0.3 0.6 0.9 0.9 0.5  EOSABS 0.0 0.0 0.0 0.0 0.0  BASOSABS 0.0 0.0 0.0 0.0 0.0    Chemistries  Recent Labs  Lab 04/19/2019 1614 04/04/2019 1715 05/04/19 0424 05/05/19 0452 05/06/19 0244 05/07/19 0611 05/06/2019 0323  NA  --   --  141 142 143 141 143  K  --   --  3.8 3.8 4.4 4.2 4.4  CL  --   --  104 105 108 107 106  CO2  --   --  23 23 25 25 27   GLUCOSE  --   --  132* 128* 134* 144* 110*  BUN  --   --  16 24* 29* 26* 27*  CREATININE  --    < > 1.09 1.17 1.18 1.21 1.30*  CALCIUM  --   --  8.6* 9.0 9.0 8.8* 9.1  AST  --   --  68* 90* 84* 85* 97*  ALT  --   --  57* 66* 66* 96* 115*  ALKPHOS  --   --  79 76 72 77 76  BILITOT  --   --  0.5 0.4 0.4 0.4 0.7  MG  --   --  2.3 2.4 2.4 2.5* 2.6*  INR 0.9  --   --   --   --   --   --    < > = values in this interval not displayed.     ------------------------------------------------------------------------------------------------------------------ No results for input(s): CHOL, HDL, LDLCALC, TRIG, CHOLHDL, LDLDIRECT in the last 72 hours.  Lab Results  Component Value Date   HGBA1C 6.1 (A) 01/02/2019   ------------------------------------------------------------------------------------------------------------------ No results for input(s): TSH, T4TOTAL, T3FREE, THYROIDAB in the last 72 hours.  Invalid input(s): FREET3  Cardiac Enzymes No results for input(s): CKMB, TROPONINI, MYOGLOBIN in the last 168 hours.  Invalid input(s): CK ------------------------------------------------------------------------------------------------------------------    Component Value Date/Time   BNP 33.0 05/17/2019 0323    Micro Results Recent Results (from the past 240 hour(s))  Blood Culture (routine x 2)     Status: None   Collection Time: 04/23/2019  4:35 PM   Specimen: BLOOD  Result Value Ref Range Status   Specimen Description BLOOD RIGHT ANTECUBITAL  Final   Special Requests    Final    BOTTLES DRAWN AEROBIC AND ANAEROBIC Blood Culture adequate volume   Culture   Final    NO GROWTH 5 DAYS Performed at [facility], [address], [place], [place] [address]    Report Status 05/09/2019 FINAL  Final  Blood Culture (routine x 2)     Status: None   Collection Time: 04/04/2019  4:51 PM   Specimen: BLOOD  Result Value Ref Range Status   Specimen Description BLOOD LEFT ANTECUBITAL  Final   Special Requests   Final    BOTTLES DRAWN AEROBIC AND ANAEROBIC Blood Culture adequate volume   Culture   Final    NO GROWTH 5 DAYS Performed at [facility], [place] [address] [place], [place] [address]    Report Status 05/23/2019 FINAL  Final    Radiology Reports DG Chest 2 View  Result Date: 04/21/2019 CLINICAL DATA:  Shortness of breath EXAM: CHEST - 2 VIEW COMPARISON:  March 03, 2009 FINDINGS: There is ill-defined airspace consolidation in the left mid lung and left lower lung regions. There is also subtle opacity in the right perihilar region. There is mild scarring in the right base. Heart size and pulmonary vascularity are normal. No adenopathy. Postoperative change noted in lower cervical region. IMPRESSION: Ill-defined airspace opacity left mid lung and left lower lung regions as well as more subtly in the right perihilar region. Appearance consistent with multifocal pneumonia. Mild atelectasis right base. Heart size normal.  No evident adenopathy. Electronically Signed   By: Lowella Grip III M.D.   On: 04/22/2019 13:25   CT  ANGIO CHEST PE W OR WO CONTRAST  Result Date: 05/05/2019 CLINICAL DATA:  Shortness of breath. COVID positive. EXAM: CT ANGIOGRAPHY CHEST WITH CONTRAST TECHNIQUE: Multidetector CT imaging of the chest was performed using the standard protocol during bolus administration of intravenous contrast. Multiplanar CT image reconstructions and MIPs were obtained to evaluate the vascular anatomy. CONTRAST:  132m OMNIPAQUE IOHEXOL 350 MG/ML SOLN  COMPARISON:  Chest x-ray from same day. FINDINGS: Cardiovascular: Satisfactory opacification of the pulmonary arteries to the segmental level. No evidence of pulmonary embolism. Normal heart size. No pericardial effusion. No thoracic aortic aneurysm or dissection. Mediastinum/Nodes: No enlarged mediastinal, hilar, or axillary lymph nodes. Thyroid gland, trachea, and esophagus demonstrate no significant findings. Lungs/Pleura: Patchy bilateral somewhat confluent ground-glass densities with areas of septal thickening in the left greater than right upper and lower lobes. Areas of subpleural banding and architectural distortion in the right lung. No pleural effusion or pneumothorax. Upper Abdomen: No acute abnormality. Small to moderate hiatal hernia. Musculoskeletal: No chest wall abnormality. No acute or significant osseous findings. Review of the MIP images confirms the above findings. IMPRESSION: 1. No evidence of pulmonary embolism. 2. Multifocal pneumonia, consistent with history of COVID-19 infection. 3. Hiatal hernia. Electronically Signed   By: WTitus DubinM.D.   On: 05/05/2019 12:36   DG Chest Port 1 View  Result Date: 05/07/2019 CLINICAL DATA:  Shortness of breath. COVID pneumonia. EXAM: PORTABLE CHEST 1 VIEW COMPARISON:  Chest x-ray 05/05/2019 FINDINGS: The cardiac silhouette, mediastinal and hilar contours are stable. Persistent patchy ill-defined interstitial and airspace infiltrates in both lungs, left worse than right. No definite pleural effusions. IMPRESSION: Persistent bilateral infiltrates, left worse than right. Electronically Signed   By: PMarijo SanesM.D.   On: 05/09/2019 08:06   DG Chest Port 1 View  Result Date: 05/05/2019 CLINICAL DATA:  Shortness of breath.  COVID positive. EXAM: PORTABLE CHEST 1 VIEW COMPARISON:  04/15/2019. FINDINGS: Mediastinum and hilar structures normal. Progressive infiltrate noted throughout the left lung. Mild infiltrate in the right upper lung. Bilateral  subsegmental atelectasis. No pleural effusion or pneumothorax. Mild cardiomegaly. No pulmonary venous congestion. Sliding hiatal hernia. Thoracic spine scoliosis. No acute bony abnormality IMPRESSION: Progressive infiltrate noted throughout the left lung. Mild infiltrate right upper lung. Bilateral subsegmental atelectasis. Electronically Signed   By: TMarcello Moores Register   On: 05/05/2019 08:39   [place]domen Limited RUQ  Result Date: 04/15/2019 CLINICAL DATA:  Elevated LFTs EXAM: ULTRASOUND ABDOMEN LIMITED RIGHT UPPER QUADRANT COMPARISON:  None. FINDINGS: Gallbladder: No gallstones or wall thickening visualized. No sonographic Murphy sign noted by sonographer. Common bile duct: Not well visualized Liver: Diffusely increased in echogenicity consistent with fatty infiltration. Portal vein is patent on color Doppler imaging with normal direction of blood flow towards the liver. Other: None. IMPRESSION: Common bile duct is not visualized on this exam although no intrahepatic ductal dilatation is seen. Normal-appearing gallbladder. Fatty liver. Electronically Signed   By: MInez CatalinaM.D.   On: 05/02/2019 23:52

## 2019-05-08 NOTE — Progress Notes (Signed)
Patient states that he had a dream about the incident. He is calm & cooperative. This RN has had patient for the past 2 nights and would recommend patient being put back on klonopin.

## 2019-05-08 NOTE — Telephone Encounter (Signed)
Joelene Millin notified as instructed by telephone.  I did reassure Maudie Mercury that according to his IP medication list, he is receiving his pain medication and his anxiety medication.  I encouraged her to call and speak with [facility]'s nurse in charge or ask for a call from the doctor taking care of Jadarius so she can share her concerns with them.

## 2019-05-08 NOTE — Telephone Encounter (Signed)
Joelene Millin (spouse) called wanting to get a message back to dr copland regarding  Pt is in hospital [facility]   She stated @ the hospital o  he was normal @ 3pm on  3/31 And then a little after 5 pt test results come back positive for covid.  The hospital called Joelene Millin to let her and that pt would be in hospital 2-3 days.  Pt is still at hospital.  She stated pt  anxiety is through the roof, and has spells that he feels like he cannot breath.  She stated pt fell last night.  She stated pt called her and wanted to know if there was something you can do help with his anxiety.  She stated they gave pt morphine last night to calm him down.  Please advise

## 2019-05-08 NOTE — Telephone Encounter (Signed)
No, I really can't have any impact or say about things that happen in the hospital.  There should be a doctor in charge of his care - make sure they are aware of her concerns.

## 2019-05-09 ENCOUNTER — Telehealth: Payer: Self-pay | Admitting: Family Medicine

## 2019-05-09 ENCOUNTER — Inpatient Hospital Stay (HOSPITAL_COMMUNITY): Payer: PPO

## 2019-05-09 ENCOUNTER — Encounter (HOSPITAL_COMMUNITY): Payer: Self-pay | Admitting: Internal Medicine

## 2019-05-09 DIAGNOSIS — N179 Acute kidney failure, unspecified: Secondary | ICD-10-CM | POA: Diagnosis present

## 2019-05-09 LAB — CBC
HCT: 39.6 % (ref 39.0–52.0)
Hemoglobin: 12.9 g/dL — ABNORMAL LOW (ref 13.0–17.0)
MCH: 30.1 pg (ref 26.0–34.0)
MCHC: 32.6 g/dL (ref 30.0–36.0)
MCV: 92.3 fL (ref 80.0–100.0)
Platelets: 387 10*3/uL (ref 150–400)
RBC: 4.29 MIL/uL (ref 4.22–5.81)
RDW: 13.3 % (ref 11.5–15.5)
WBC: 12.8 10*3/uL — ABNORMAL HIGH (ref 4.0–10.5)
nRBC: 0.4 % — ABNORMAL HIGH (ref 0.0–0.2)

## 2019-05-09 LAB — COMPREHENSIVE METABOLIC PANEL
ALT: 133 U/L — ABNORMAL HIGH (ref 0–44)
AST: 75 U/L — ABNORMAL HIGH (ref 15–41)
Albumin: 2.9 g/dL — ABNORMAL LOW (ref 3.5–5.0)
Alkaline Phosphatase: 75 U/L (ref 38–126)
Anion gap: 12 (ref 5–15)
BUN: 29 mg/dL — ABNORMAL HIGH (ref 6–20)
CO2: 23 mmol/L (ref 22–32)
Calcium: 8.7 mg/dL — ABNORMAL LOW (ref 8.9–10.3)
Chloride: 105 mmol/L (ref 98–111)
Creatinine, Ser: 1.25 mg/dL — ABNORMAL HIGH (ref 0.61–1.24)
GFR calc Af Amer: >60 mL/min (ref >60)
GFR calc non Af Amer: >60 mL/min (ref >60)
Glucose, Bld: 128 mg/dL — ABNORMAL HIGH (ref 70–99)
Potassium: 4.2 mmol/L (ref 3.5–5.1)
Sodium: 140 mmol/L (ref 135–145)
Total Bilirubin: 0.8 mg/dL (ref 0.3–1.2)
Total Protein: 5.4 g/dL — ABNORMAL LOW (ref 6.5–8.1)

## 2019-05-09 LAB — CREATININE, SERUM
Creatinine, Ser: 1.25 mg/dL — ABNORMAL HIGH (ref 0.61–1.24)
GFR calc Af Amer: >60 mL/min (ref >60)
GFR calc non Af Amer: >60 mL/min (ref >60)

## 2019-05-09 LAB — CBC WITH DIFFERENTIAL/PLATELET
Abs Immature Granulocytes: 0.33 10*3/uL — ABNORMAL HIGH (ref 0.00–0.07)
Basophils Absolute: 0 10*3/uL (ref 0.0–0.1)
Basophils Relative: 0 %
Eosinophils Absolute: 0 10*3/uL (ref 0.0–0.5)
Eosinophils Relative: 0 %
HCT: 40.4 % (ref 39.0–52.0)
Hemoglobin: 13.1 g/dL (ref 13.0–17.0)
Immature Granulocytes: 3 %
Lymphocytes Relative: 8 %
Lymphs Abs: 1.1 10*3/uL (ref 0.7–4.0)
MCH: 30.3 pg (ref 26.0–34.0)
MCHC: 32.4 g/dL (ref 30.0–36.0)
MCV: 93.3 fL (ref 80.0–100.0)
Monocytes Absolute: 0.6 10*3/uL (ref 0.1–1.0)
Monocytes Relative: 5 %
Neutro Abs: 11.1 10*3/uL — ABNORMAL HIGH (ref 1.7–7.7)
Neutrophils Relative %: 84 %
Platelets: 383 10*3/uL (ref 150–400)
RBC: 4.33 MIL/uL (ref 4.22–5.81)
RDW: 13.3 % (ref 11.5–15.5)
WBC: 13.1 10*3/uL — ABNORMAL HIGH (ref 4.0–10.5)
nRBC: 0.3 % — ABNORMAL HIGH (ref 0.0–0.2)

## 2019-05-09 LAB — FERRITIN: Ferritin: 242 ng/mL (ref 24–336)

## 2019-05-09 LAB — PROCALCITONIN: Procalcitonin: <0.1 ng/mL

## 2019-05-09 LAB — C-REACTIVE PROTEIN: CRP: 1.5 mg/dL — ABNORMAL HIGH (ref <1.0)

## 2019-05-09 LAB — BRAIN NATRIURETIC PEPTIDE: B Natriuretic Peptide: 21.5 pg/mL (ref 0.0–100.0)

## 2019-05-09 LAB — D-DIMER, QUANTITATIVE: D-Dimer, Quant: 3.26 ug/mL-FEU — ABNORMAL HIGH (ref 0.00–0.50)

## 2019-05-09 MED ORDER — ONDANSETRON HCL 4 MG/2ML IJ SOLN: 4.0000 mg | Freq: Four times a day (QID) | INTRAMUSCULAR | Status: DC | PRN

## 2019-05-09 MED ORDER — ENOXAPARIN SODIUM 40 MG/0.4ML ~~LOC~~ SOLN: 40.0000 mg | SUBCUTANEOUS | Status: DC

## 2019-05-09 MED ORDER — ACETAMINOPHEN 650 MG RE SUPP: 650.0000 mg | Freq: Four times a day (QID) | RECTAL | Status: DC | PRN

## 2019-05-09 MED ORDER — ONDANSETRON HCL 4 MG PO TABS: 4.0000 mg | ORAL_TABLET | Freq: Four times a day (QID) | ORAL | Status: DC | PRN

## 2019-05-09 MED ORDER — MELATONIN 3 MG PO TABS
12.0000 mg | ORAL_TABLET | Freq: Every evening | ORAL | Status: DC | PRN
  Administered 2019-05-10: 12 mg via ORAL
  Filled 2019-05-09 (×3): qty 4

## 2019-05-09 MED ORDER — HYDROXYZINE HCL 25 MG PO TABS
25.0000 mg | ORAL_TABLET | Freq: Three times a day (TID) | ORAL | Status: DC | PRN
  Administered 2019-05-09 – 2019-05-10 (×2): 25 mg via ORAL
  Filled 2019-05-09 (×2): qty 1

## 2019-05-09 MED ORDER — ENOXAPARIN SODIUM 120 MG/0.8ML ~~LOC~~ SOLN
110.0000 mg | Freq: Two times a day (BID) | SUBCUTANEOUS | Status: DC
  Administered 2019-05-09 – 2019-05-10 (×4): 110 mg via SUBCUTANEOUS
  Filled 2019-05-09 (×5): qty 0.73

## 2019-05-09 MED ORDER — CITALOPRAM HYDROBROMIDE 40 MG PO TABS
40.0000 mg | ORAL_TABLET | Freq: Every day | ORAL | Status: DC
  Administered 2019-05-09 – 2019-05-10 (×3): 40 mg via ORAL
  Filled 2019-05-09 (×3): qty 1

## 2019-05-09 MED ORDER — ATORVASTATIN CALCIUM 80 MG PO TABS
80.0000 mg | ORAL_TABLET | Freq: Every day | ORAL | Status: DC
  Administered 2019-05-09 – 2019-05-10 (×2): 80 mg via ORAL
  Filled 2019-05-09 (×2): qty 1

## 2019-05-09 MED ORDER — ASPIRIN 81 MG PO CHEW
81.0000 mg | CHEWABLE_TABLET | Freq: Every day | ORAL | Status: DC
  Administered 2019-05-09 – 2019-05-10 (×2): 81 mg via ORAL
  Filled 2019-05-09 (×2): qty 1

## 2019-05-09 MED ORDER — CLONAZEPAM 0.5 MG PO TABS
0.5000 mg | ORAL_TABLET | Freq: Two times a day (BID) | ORAL | Status: DC | PRN
  Administered 2019-05-09: 04:00:00 0.5 mg via ORAL
  Filled 2019-05-09: qty 1

## 2019-05-09 MED ORDER — DEXAMETHASONE SODIUM PHOSPHATE 10 MG/ML IJ SOLN
6.0000 mg | INTRAMUSCULAR | Status: AC
  Administered 2019-05-09 – 2019-05-12 (×4): 6 mg via INTRAVENOUS
  Filled 2019-05-09 (×4): qty 1

## 2019-05-09 MED ORDER — FUROSEMIDE 10 MG/ML IJ SOLN
40.0000 mg | Freq: Once | INTRAMUSCULAR | Status: AC
  Administered 2019-05-09: 40 mg via INTRAVENOUS
  Filled 2019-05-09 (×2): qty 4

## 2019-05-09 MED ORDER — CLONAZEPAM 1 MG PO TABS
1.0000 mg | ORAL_TABLET | Freq: Two times a day (BID) | ORAL | Status: DC | PRN
  Administered 2019-05-09 – 2019-05-10 (×2): 1 mg via ORAL
  Filled 2019-05-09 (×2): qty 1

## 2019-05-09 MED ORDER — ACETAMINOPHEN 325 MG PO TABS
650.0000 mg | ORAL_TABLET | Freq: Four times a day (QID) | ORAL | Status: DC | PRN
  Administered 2019-05-10: 650 mg via ORAL
  Filled 2019-05-09: qty 2

## 2019-05-09 MED ORDER — BUPROPION HCL ER (XL) 300 MG PO TB24
300.0000 mg | ORAL_TABLET | Freq: Every day | ORAL | Status: DC
  Administered 2019-05-09 – 2019-05-10 (×2): 300 mg via ORAL
  Filled 2019-05-09: qty 1
  Filled 2019-05-09 (×2): qty 2

## 2019-05-09 MED ORDER — ALPRAZOLAM 0.5 MG PO TABS
0.5000 mg | ORAL_TABLET | Freq: Two times a day (BID) | ORAL | Status: DC | PRN
  Administered 2019-05-09 – 2019-05-11 (×4): 0.5 mg via ORAL
  Filled 2019-05-09 (×4): qty 1

## 2019-05-09 MED ORDER — PANTOPRAZOLE SODIUM 40 MG PO TBEC
40.0000 mg | DELAYED_RELEASE_TABLET | Freq: Every day | ORAL | Status: DC
  Administered 2019-05-09 – 2019-05-10 (×2): 40 mg via ORAL
  Filled 2019-05-09 (×2): qty 1

## 2019-05-09 MED ORDER — IOHEXOL 350 MG/ML SOLN
100.0000 mL | Freq: Once | INTRAVENOUS | Status: AC | PRN
  Administered 2019-05-09: 100 mL via INTRAVENOUS

## 2019-05-09 NOTE — Consult Note (Addendum)
Telepsych Consultation   Reason for Consult:  Generalized Anxiety Disorder Referring Physician:  Dr. Candiss Norse Location of Patient: [facility] [phone] Location of Provider: [place] Department  Patient Identification: Matthew Eaton MRN:  [id number] Principal Diagnosis: Acute hypoxemic respiratory failure due to COVID-19 [facility]) Diagnosis:  Principal Problem:   Acute hypoxemic respiratory failure due to COVID-19 [facility]) Active Problems:   Hyperlipidemia   Generalized anxiety disorder   Acute respiratory failure with hypoxia ([place])   ARF (acute renal failure) ([place])   Total Time spent with patient: 30 minutes  Subjective:   Matthew Eaton is a 59 y.o. male patient admitted with COVID -19 pneumonia, chronic pain, depression, and anxiety who presented to the ED via EMS after signing out AMA. Patient was assessed and case discussed with Dr. Dwyane Dee. At the time of the evaluation patient was pleasant and engaging well with Probation officer. When asked what brought him to the hospital he states " I got so aggravated and the room was closing in on me I had to leave. I cant sit still or be still because that makes my anxiety worse. " Patient reports a history of anxiety and depression that is being manged by a Dr. Edilia Bo (PCP and pain management). He is currently receiving Buproprion 300mg  XL, Citalopram 40mg  po daily and Clonazepam 0.5mg  po BID. He reports compliance with this medication. He denies any previous history of suicidal attempts, gestures, ideations or threats. He has never been hospitalized for his mental illness.    Mental capacity was not requested however patient was screened and at this time. It does not appear that patient has mental capacity to make medical decisions, will need to contact next of kin or ethics for further decision making. When assessing for capacity and risks Patient unable to report risks. " I could lose my family. I might not make it home. " He was also unable to  identify the risks that his current condition would impose or impact his family. "They say I have pneumonia but I want to call my family doctor. Can I call my family doctor and have him to come see my while Im in the hospital? " Patient previously signed out AMA, and although he does not have the mental capacity to make decisions he does not meet inpatient criteria.    HPI:  Matthew Eaton is a 59 y.o. male with history of depression anxiety and chronic pain with hyperlipidemia presents to the ER again after signing out [place] last evening after being treated for COVID-19 for 5 days in the hospital.  During the hospitalization patient was treated with Actemra remdesivir at full dose and Decadron.  Patient states he got more short of breath but no chest pain or productive cough or fever or chills.  ED Course: In the ER patient is on nonrebreather this x-ray showing worsening infiltrates.  Labs show elevated D-dimer of 4.94 CBC shows WBC count of 14.4 troponin was 7 ferritin 363 CRP 1.9 creatinine 1.3 BNP pending.  Patient was restarted on Decadron admitted for further management of acute respiratory failure with hypoxia secondary Covid infection.  Past Psychiatric History: See above  Risk to Self:   Denies Risk to Others:   Denies Prior Inpatient Therapy:   Denies Prior Outpatient Therapy:  Denies, currently receiving services from his PCP.   Past Medical History:  Past Medical History:  Diagnosis Date  . Chronic neck pain 05/20/2012  . Family hx of ALS (amyotrophic lateral sclerosis) 05/27/2013  .  GERD (gastroesophageal reflux disease)   . Hiatal hernia   . HLD (hyperlipidemia)   . Major depressive disorder, recurrent episode, moderate ([place]) 05/27/2013  . Neck pain    cervical spine with herniation  . Tobacco abuse   . Wrist fracture, left     Past Surgical History:  Procedure Laterality Date  . NECK SURGERY     x2- 2010, 2011   Family History:  Family History   Problem Relation Age of Onset  . ALS Mother   . ALS Brother   . ALS Maternal Uncle   . ALS Maternal Aunt   . Colon cancer Neg Hx   . Esophageal cancer Neg Hx   . Rectal cancer Neg Hx   . Stomach cancer Neg Hx    Family Psychiatric  History: Denies Social History:  Social History   Substance and Sexual Activity  Alcohol Use No     Social History   Substance and Sexual Activity  Drug Use No    Social History   Socioeconomic History  . Marital status: Married    Spouse name: Not on file  . Number of children: 5  . Years of education: 39  . Highest education level: Not on file  Occupational History  . Not on file  Tobacco Use  . Smoking status: Former Smoker    Years: 30.00    Quit date: 02/03/2007    Years since quitting: 12.2  . Smokeless tobacco: Never Used  Substance and Sexual Activity  . Alcohol use: No  . Drug use: No  . Sexual activity: Yes    Partners: Female  Other Topics Concern  . Not on file  Social History Narrative   Lives home with wife, Joelene Millin.  Education 12th grade.  Five children.   Social Determinants of Health   Financial Resource Strain:   . Difficulty of Paying Living Expenses:   Food Insecurity:   . Worried About Charity fundraiser in the Last Year:   . Arboriculturist in the Last Year:   Transportation Needs:   . Film/video editor (Medical):   Marland Kitchen Lack of Transportation (Non-Medical):   Physical Activity:   . Days of Exercise per Week:   . Minutes of Exercise per Session:   Stress:   . Feeling of Stress :   Social Connections:   . Frequency of Communication with Friends and Family:   . Frequency of Social Gatherings with Friends and Family:   . Attends Religious Services:   . Active Member of Clubs or Organizations:   . Attends Archivist Meetings:   Marland Kitchen Marital Status:    Additional Social History:    Allergies:   Allergies  Allergen Reactions  . Gabapentin Other (See Comments)    Severe tingling in the  legs and weakness (of the legs)  . Methocarbamol Anaphylaxis, Shortness Of Breath and Other (See Comments)    Respiratory arrest- Turned purple    Labs:  Results for orders placed or performed during the hospital encounter of 06/01/2019 (from the past 48 hour(s))  Comprehensive metabolic panel     Status: Abnormal   Collection Time: 05/31/2019 10:26 PM  Result Value Ref Range   Sodium 137 135 - 145 mmol/L   Potassium 4.8 3.5 - 5.1 mmol/L   Chloride 104 98 - 111 mmol/L   CO2 21 (L) 22 - 32 mmol/L   Glucose, Bld 114 (H) 70 - 99 mg/dL    Comment: Glucose reference  range applies only to samples taken after fasting for at least 8 hours.   BUN 30 (H) 6 - 20 mg/dL   Creatinine, Ser 1.37 (H) 0.61 - 1.24 mg/dL   Calcium 8.8 (L) 8.9 - 10.3 mg/dL   Total Protein 6.2 (L) 6.5 - 8.1 g/dL   Albumin 3.2 (L) 3.5 - 5.0 g/dL   AST 123 (H) 15 - 41 U/L   ALT 172 (H) 0 - 44 U/L   Alkaline Phosphatase 97 38 - 126 U/L   Total Bilirubin 1.2 0.3 - 1.2 mg/dL   GFR calc non Af Amer 56 (L) >60 mL/min   GFR calc Af Amer >60 >60 mL/min   Anion gap 12 5 - 15    Comment: Performed at [facility] [address], [place], [place] [address]  CBC with Differential/Platelet     Status: Abnormal   Collection Time: 05/07/2019 10:26 PM  Result Value Ref Range   WBC 14.4 (H) 4.0 - 10.5 K/uL   RBC 4.85 4.22 - 5.81 MIL/uL   Hemoglobin 14.8 13.0 - 17.0 g/dL   HCT 45.9 39.0 - 52.0 %   MCV 94.6 80.0 - 100.0 fL   MCH 30.5 26.0 - 34.0 pg   MCHC 32.2 30.0 - 36.0 g/dL   RDW 13.3 11.5 - 15.5 %   Platelets 464 (H) 150 - 400 K/uL   nRBC 0.6 (H) 0.0 - 0.2 %   Neutrophils Relative % 82 %   Neutro Abs 11.9 (H) 1.7 - 7.7 K/uL   Lymphocytes Relative 8 %   Lymphs Abs 1.1 0.7 - 4.0 K/uL   Monocytes Relative 5 %   Monocytes Absolute 0.7 0.1 - 1.0 K/uL   Eosinophils Relative 0 %   Eosinophils Absolute 0.0 0.0 - 0.5 K/uL   Basophils Relative 0 %   Basophils Absolute 0.1 0.0 - 0.1 K/uL   Immature Granulocytes 5 %   Abs  Immature Granulocytes 0.69 (H) 0.00 - 0.07 K/uL    Comment: Performed at [facility], [address], [place], [place] [address]  Troponin I (High Sensitivity)     Status: None   Collection Time: 05/24/2019 10:26 PM  Result Value Ref Range   Troponin I (High Sensitivity) 7 <18 ng/L    Comment: (NOTE) Elevated high sensitivity troponin I (hsTnI) values and significant  changes across serial measurements may suggest ACS but many other  chronic and acute conditions are known to elevate hsTnI results.  Refer to the "Links" section for chest pain algorithms and additional  guidance. Performed at [facility], [place] [address], [place], [place] [address]   D-dimer, quantitative (not at [facility])     Status: Abnormal   Collection Time: 05/05/2019 10:26 PM  Result Value Ref Range   D-Dimer, Quant 4.94 (H) 0.00 - 0.50 ug/mL-FEU    Comment: (NOTE) At the manufacturer cut-off of 0.50 ug/mL FEU, this assay has been documented to exclude PE with a sensitivity and negative predictive value of 97 to 99%.  At this time, this assay has not been approved by the FDA to exclude DVT/VTE. Results should be correlated with clinical presentation. Performed at [facility], [place] [address] [place], [place] [address]   C-reactive protein     Status: Abnormal   Collection Time: 05/07/2019 10:26 PM  Result Value Ref Range   CRP 1.9 (H) <1.0 mg/dL    Comment: Performed at [place],  [place] [address]  Ferritin     Status: Abnormal   Collection Time: 05/06/2019 10:26 PM  Result Value Ref Range   Ferritin 363 (H) 24 - 336 ng/mL    Comment: Performed at [facility], [place] [address] [place], [place] [address]  Type and screen [place]     Status: None   Collection Time: 06/02/2019 10:33 PM  Result Value Ref Range   ABO/RH(D) A POS    Antibody Screen NEG    Sample Expiration      05/11/2019,2359 Performed at [facility], [place]  [address], [place], [place] [address]   CBC     Status: Abnormal   Collection Time: 05/09/19  2:26 AM  Result Value Ref Range   WBC 12.8 (H) 4.0 - 10.5 K/uL   RBC 4.29 4.22 - 5.81 MIL/uL   Hemoglobin 12.9 (L) 13.0 - 17.0 g/dL   HCT 39.6 39.0 - 52.0 %   MCV 92.3 80.0 - 100.0 fL   MCH 30.1 26.0 - 34.0 pg   MCHC 32.6 30.0 - 36.0 g/dL   RDW 13.3 11.5 - 15.5 %   Platelets 387 150 - 400 K/uL   nRBC 0.4 (H) 0.0 - 0.2 %    Comment: Performed at [facility], [place] [address], [place], [place] [address]  Creatinine, serum     Status: Abnormal   Collection Time: 05/09/19  2:26 AM  Result Value Ref Range   Creatinine, Ser 1.25 (H) 0.61 - 1.24 mg/dL   GFR calc non Af Amer >60 >60 mL/min   GFR calc Af Amer >60 >60 mL/min    Comment: Performed at [facility] [address], [place], [place] [address]  Brain natriuretic peptide     Status: None   Collection Time: 05/09/19  2:26 AM  Result Value Ref Range   B Natriuretic Peptide 21.5 0.0 - 100.0 pg/mL    Comment: Performed at [facility] [address], [place], [place] [address]  Procalcitonin - Baseline     Status: None   Collection Time: 05/09/19  7:10 AM  Result Value Ref Range   Procalcitonin <0.10 ng/mL    Comment:        Interpretation: PCT (Procalcitonin) <= 0.5 ng/mL: Systemic infection (sepsis) is not likely. Local bacterial infection is possible. (NOTE)       Sepsis PCT Algorithm           Lower Respiratory Tract                                      Infection PCT Algorithm    ----------------------------     ----------------------------         PCT < 0.25 ng/mL                PCT < 0.10 ng/mL         Strongly encourage             Strongly discourage   discontinuation of antibiotics    initiation of antibiotics    ----------------------------     -----------------------------       PCT 0.25 - 0.50 ng/mL            PCT 0.10 - 0.25 ng/mL               OR       >  80% decrease in PCT            Discourage initiation  of                                            antibiotics      Encourage discontinuation           of antibiotics    ----------------------------     -----------------------------         PCT >= 0.50 ng/mL              PCT 0.26 - 0.50 ng/mL               AND        <80% decrease in PCT             Encourage initiation of                                             antibiotics       Encourage continuation           of antibiotics    ----------------------------     -----------------------------        PCT >= 0.50 ng/mL                  PCT > 0.50 ng/mL               AND         increase in PCT                  Strongly encourage                                      initiation of antibiotics    Strongly encourage escalation           of antibiotics                                     -----------------------------                                           PCT <= 0.25 ng/mL                                                 OR                                        > 80% decrease in PCT                                     Discontinue / Do not initiate  antibiotics Performed at [facility], [place] [address] [place], [place] [address]   Comprehensive metabolic panel     Status: Abnormal   Collection Time: 05/09/19  7:10 AM  Result Value Ref Range   Sodium 140 135 - 145 mmol/L   Potassium 4.2 3.5 - 5.1 mmol/L   Chloride 105 98 - 111 mmol/L   CO2 23 22 - 32 mmol/L   Glucose, Bld 128 (H) 70 - 99 mg/dL    Comment: Glucose reference range applies only to samples taken after fasting for at least 8 hours.   BUN 29 (H) 6 - 20 mg/dL   Creatinine, Ser 1.25 (H) 0.61 - 1.24 mg/dL   Calcium 8.7 (L) 8.9 - 10.3 mg/dL   Total Protein 5.4 (L) 6.5 - 8.1 g/dL   Albumin 2.9 (L) 3.5 - 5.0 g/dL   AST 75 (H) 15 - 41 U/L   ALT 133 (H) 0 - 44 U/L   Alkaline Phosphatase 75 38 - 126 U/L   Total Bilirubin 0.8 0.3 - 1.2 mg/dL   GFR calc non Af Amer >60 >60  mL/min   GFR calc Af Amer >60 >60 mL/min   Anion gap 12 5 - 15    Comment: Performed at [facility] [address] [place], [place] [address]  CBC with Differential/Platelet     Status: Abnormal   Collection Time: 05/09/19  7:10 AM  Result Value Ref Range   WBC 13.1 (H) 4.0 - 10.5 K/uL   RBC 4.33 4.22 - 5.81 MIL/uL   Hemoglobin 13.1 13.0 - 17.0 g/dL   HCT 40.4 39.0 - 52.0 %   MCV 93.3 80.0 - 100.0 fL   MCH 30.3 26.0 - 34.0 pg   MCHC 32.4 30.0 - 36.0 g/dL   RDW 13.3 11.5 - 15.5 %   Platelets 383 150 - 400 K/uL   nRBC 0.3 (H) 0.0 - 0.2 %   Neutrophils Relative % 84 %   Neutro Abs 11.1 (H) 1.7 - 7.7 K/uL   Lymphocytes Relative 8 %   Lymphs Abs 1.1 0.7 - 4.0 K/uL   Monocytes Relative 5 %   Monocytes Absolute 0.6 0.1 - 1.0 K/uL   Eosinophils Relative 0 %   Eosinophils Absolute 0.0 0.0 - 0.5 K/uL   Basophils Relative 0 %   Basophils Absolute 0.0 0.0 - 0.1 K/uL   Immature Granulocytes 3 %   Abs Immature Granulocytes 0.33 (H) 0.00 - 0.07 K/uL    Comment: Performed at [facility] [address] [place], [place] [address]  C-reactive protein     Status: Abnormal   Collection Time: 05/09/19  7:10 AM  Result Value Ref Range   CRP 1.5 (H) <1.0 mg/dL    Comment: Performed at [facility] [address] [place], [place] [address]  D-dimer, quantitative (not at [facility])     Status: Abnormal   Collection Time: 05/09/19  7:10 AM  Result Value Ref Range   D-Dimer, Quant 3.26 (H) 0.00 - 0.50 ug/mL-FEU    Comment: (NOTE) At the manufacturer cut-off of 0.50 ug/mL FEU, this assay has been documented to exclude PE with a sensitivity and negative predictive value of 97 to 99%.  At this time, this assay has not been approved by the FDA to exclude DVT/VTE. Results should be correlated with clinical presentation. Performed at [facility], [place] [address] [place],  [address]   Ferritin     Status:  None   Collection Time: 05/09/19  7:10 AM  Result Value Ref  Range   Ferritin 242 24 - 336 ng/mL    Comment: Performed at [facility], [place] [address] [place], [place] [address]    Medications:  Current Facility-Administered Medications  Medication Dose Route Frequency Provider Last Rate Last Admin  . acetaminophen (TYLENOL) tablet 650 mg  650 mg Oral Q6H PRN Rise Patience, MD      . ALPRAZolam Duanne Moron) tablet 0.5 mg  0.5 mg Oral BID PRN Thurnell Lose, MD   0.5 mg at 05/09/19 0827  . aspirin chewable tablet 81 mg  81 mg Oral Daily Rise Patience, MD   81 mg at 05/09/19 [id number]  . atorvastatin (LIPITOR) tablet 80 mg  80 mg Oral Q supper Rise Patience, MD      . buPROPion (WELLBUTRIN XL) 24 hr tablet 300 mg  300 mg Oral Daily Rise Patience, MD   300 mg at 05/09/19 [id number]  . citalopram (CELEXA) tablet 40 mg  40 mg Oral QHS Rise Patience, MD   40 mg at 05/09/19 0221  . clonazePAM (KLONOPIN) tablet 1 mg  1 mg Oral BID PRN Thurnell Lose, MD      . dexamethasone (DECADRON) injection 6 mg  6 mg Intravenous Q24H Rise Patience, MD      . enoxaparin (LOVENOX) injection 110 mg  110 mg Subcutaneous Q12H Thurnell Lose, MD   110 mg at 05/09/19 0957  . HYDROcodone-acetaminophen (NORCO) 10-325 MG per tablet 1 tablet  1 tablet Oral Q6H PRN Rise Patience, MD   1 tablet at 05/09/19 0630  . melatonin tablet 12 mg  12 mg Oral QHS PRN Rise Patience, MD      . ondansetron [facility]) injection 4 mg  4 mg Intravenous Q6H PRN Rise Patience, MD      . pantoprazole (PROTONIX) EC tablet 40 mg  40 mg Oral Daily Rise Patience, MD   40 mg at 05/09/19 [id number]    Musculoskeletal: Strength & Muscle Tone: within normal limits Gait & Station: normal Patient leans: N/A  Psychiatric Specialty Exam: Physical Exam  Review of Systems  Blood pressure 120/80, pulse 70, temperature 98 F (36.7 C), temperature source Oral, resp. rate (!) 21, height 5\' 10"  (1.778 m), weight 113.4 kg, SpO2 92 %.Body mass index is  35.87 kg/m.  General Appearance: Fairly Groomed  Eye Contact:  Fair  Speech:  Clear and Coherent and Normal Rate  Volume:  Normal  Mood:  Anxious  Affect:  Appropriate and Congruent  Thought Process:  Coherent, Linear and Descriptions of Associations: Intact  Orientation:  Full (Time, Place, and Person)  Thought Content:  Logical  Suicidal Thoughts:  No  Homicidal Thoughts:  No  Memory:  Immediate;   Fair Recent;   Fair  Judgement:  Fair  Insight:  Fair  Psychomotor Activity:  Normal  Concentration:  Concentration: Fair and Attention Span: Fair  Recall:  [employer] of Knowledge:  Fair  Language:  Fair  Akathisia:  No  Handed:  Right  AIMS (if indicated):     Assets:  Communication Skills Desire for Improvement Financial Resources/Insurance Housing Leisure Time Physical Health Social Support  ADL's:  Intact  Cognition:  WNL  Sleep:        Treatment Plan Summary: Daily contact with patient to assess and evaluate symptoms and progress in treatment, Medication management and Plan Continue current  medications. He has been complaint with his home medications prior to this admission. His worsening anxiety may also be secondary to COVID-19 which is most likely increasing his hypoxia and claustrophobia. WIll add Hydroxyzine 25mg  po TID prn for anxiety.  Mental capacity was not requested however patient was screened and at this time. It does not appear that patient has mental capacity to make medical decisions, will need to contact next of kin or ethics for further decision making. When assessing for capacity and risks Patient unable to report risks. " I could lose my family. I might not make it home. " He was also unable to identify the risks that his current condition would impose or impact his family. "They say I have pneumonia but I want to call my family doctor. Can I call my family doctor and have him to come see my while Im in the hospital? " Patient previously signed out AMA, and  although he does not have the mental capacity to make decisions he does not meet inpatient criteria.   Disposition: No evidence of imminent risk to self or others at present.   Patient does not meet criteria for psychiatric inpatient admission. Supportive therapy provided about ongoing stressors. Discussed crisis plan, support from social network, calling 911, coming to the Emergency Department, and calling Suicide Hotline.  This service was provided via telemedicine using a 2-way, interactive audio and video technology.  Names of all persons participating in this telemedicine service and their role in this encounter. Name: Sheran Fava Role: FNP-BC, PMHNP-BC,. DNP  Name: Terence Lux Role: Patient    Suella Broad, FNP 05/09/2019 11:19 AM

## 2019-05-09 NOTE — Progress Notes (Addendum)
AMA  Patient at this time expresses desire to leave the Hospital immidiately, patient has been warned that this is not Medically advisable at this time, and can result in Medical complications like Death and Disability, patient understands and accepts the risks involved and assumes full responsibilty of this decision.   Lala Lund M.D on 05/09/2019 at 11:57 AM  [facility]  Time < 30 minutes  Last Note Below                                                                     PROGRESS NOTE                                                                                                                                                                                                             Patient Demographics:    Matthew Eaton, is a 59 y.o. male, DOB - [birth year], INO:[id number]  Outpatient Primary MD for the patient is Copland, Frederico Hamman, MD    LOS - 1  Admit date - 06/01/2019    Chief Complaint  Patient presents with   Shortness of Breath       Brief Narrative - Matthew Eaton is a 59 y.o. male with medical history significant of hyperlipidemia, chronic pain syndrome, former smoker, GERD, depression presents to emergency department due to shortness of breath with exertion since 2 days, was diagnosed with acute hypoxic respiratory failure due to COVID-19 pneumonia and admitted to the hospital.  Patient remained severely  during  the ongoing 2020 Covid 19 Pandemic - he has severe disease although his CRP is not reflective of that, he has hypoxia and large AA gradient, he had been sick for a while before he came to the hospital, was promptly started on steroids and remdesivir, received Actemra on 05/04/2019, he has finished his COVID-19 treatment and his oxygen requirements gradually coming down some from 15 L/min to 12 L on 05/09/2019 but still overall quite hypoxic, continue to monitor closely, note he had a negative CT angiogram on 05/05/2019. ° °However his D-dimer has been rising, I have switched him on therapeutic dose Lovenox on 05/09/2019, repeat CT angiogram, lower extremity venous duplex along with echocardiogram.  No good explanation for ongoing severe hypoxia. ° °Encouraged the patient to sit up in chair in the daytime use I-S and flutter valve for pulmonary toiletry and then prone in bed when at night.  Will advance activity and titrate down oxygen as possible.  He is reluctant to sit in the recliner. ° °SpO2: 92 % °O2 Flow Rate (L/min): 12 L/min ° ° °Recent Labs  °Lab 05/05/19 °0452 05/05/19 °0452 05/06/19 °0244 05/07/19 °0611 05/17/2019 °0323 05/21/2019 °2226 05/09/19 °0226 05/09/19 °0710  °CRP 2.0*   < > 1.0* 1.0* 1.7* 1.9*  --  1.5*  °DDIMER 0.37   < > 0.49 0.52* 1.22* 4.94*  --  3.26*  °BNP 40.5  --  59.4 32.7 33.0  --  21.5  --   °PROCALCITON <0.10  --  <0.10 <0.10 <0.10  --   --  <0.10  ° < > = values in this interval not displayed.  ° ° °Hepatic Function Latest Ref Rng & Units 05/09/2019 05/19/2019 06/02/2019  °Total Protein 6.5 - 8.1 g/dL 5.4(L) 6.2(L) 6.0(L)  °Albumin 3.5 - 5.0 g/dL 2.9(L) 3.2(L) 3.0(L)  °AST 15 - 41 U/L 75(H) 123(H) 97(H)  °ALT 0 - 44 U/L 133(H) 172(H) 115(H)  °Alk Phosphatase 38 - 126 U/L 75 97 76  °Total Bilirubin 0.3 - 1.2 mg/dL 0.8 1.2 0.7  °Bilirubin, Direct 0.0 - 0.2 mg/dL - - -  ° ° °2.  COVID-19 infection related moderate asymptomatic transaminitis.  Stable, stable right upper quadrant ultrasound, trend. ° °3.   Dyslipidemia.  Continue home dose statin. ° °4.  History of anxiety and depression.  On multiple medications including Celexa, Wellbutrin, nortriptyline and as needed Klonopin.  Continued.  Patient is noncompliant with medications and repeatedly rude to other staff.  Counseled him and his wife.  Note patient left the hospital AMA for a few hours on 05/18/2019 and then came right back, currently states he is not anxious but he gets episodes where he gets extremely anxious and wants to go home.  Psych will be consulted. ° °5.  Chronic neck pain.  On chronic narcotics.  Minimize dose last best as we can.  He is currently in no distress.  He had an episode where he looks like he was withdrawing from narcotics on 05/07/2019, relaxed after getting IV morphine.  He is currently on oral narcotics, IV for breakthrough, scheduled twice daily Klonopin along with Xanax as needed for breakthrough anxiety. ° °I asked the patient how much narcotics he takes at home and he says unlocked, he says I take only what I am being prescribed. ° ° °  ° °Condition - Extremely Guarded ° °Family Communication  : Wife Kimberley - [phone] on 05/04/19, 05/06/19, 05/12/2019 ° °Code Status :  Full ° °Diet :  ° °Diet Order   °       °    He is currently on oral narcotics, IV for breakthrough, scheduled twice daily Klonopin along with Xanax as needed for breakthrough anxiety.  I asked the patient how much narcotics he takes at home and he says unlocked, he says I take only what I am being prescribed.      Condition - Extremely Guarded  Family Communication  : [name] [phone] on 05/04/19, 05/06/19, 05/10/2019  Code Status :  Full  Diet :   Diet Order            Diet Heart Room service appropriate? Yes; Fluid consistency: Thin  Diet effective now               Disposition Plan  : In the hospital and complete treatment with IV remdesivir and steroids, Actemra, CTA to rule out PE.  Still severely hypoxic.  Consults  : Psych consulted 05/09/2019   Procedures  :    RUQ [place] - stable  CTA x 1 on 05/05/19 - 1. No evidence of pulmonary embolism. 2. Multifocal pneumonia, consistent with history of COVID-19 infection. 3. Hiatal hernia.  TTE, CTA and Leg Venous US - reordered 05/09/19 -   PUD Prophylaxis : PPI  DVT Prophylaxis  :  Lovenox    Lab Results  Component Value Date   PLT 383 05/09/2019    Inpatient Medications  Scheduled Meds:  aspirin  81 mg Oral Daily    atorvastatin  80 mg Oral Q supper   buPROPion  300 mg Oral Daily   citalopram  40 mg Oral QHS   dexamethasone (DECADRON) injection  6 mg Intravenous Q24H   enoxaparin (LOVENOX) injection  110 mg Subcutaneous Q12H   pantoprazole  40 mg Oral Daily   Continuous Infusions:  PRN Meds:.acetaminophen **OR** [DISCONTINUED] acetaminophen, ALPRAZolam, clonazePAM, HYDROcodone-acetaminophen, melatonin, [DISCONTINUED] ondansetron **OR** ondansetron (ZOFRAN) IV  Antibiotics  :    Anti-infectives (From admission, onward)   None       Time Spent in minutes  30   Lala Lund M.D on 05/09/2019 at 11:57 AM  To page go to [email or web address] - password [facility]  [facility] -  Office  [phone]    See all Orders from today for further details    Objective:   Vitals:   05/09/19 0147 05/09/19 0156 05/09/19 0425 05/09/19 0800  BP: 120/79  124/84 120/80  Pulse: 69 82 71 70  Resp: 15 17 (!) 22 (!) 21  Temp: 98.2 F (36.8 C)  97.6 F (36.4 C) 98 F (36.7 C)  TempSrc: Oral  Axillary Oral  SpO2: (!) 80% 91% (!) 86% 92%  Weight:      Height:        Wt Readings from Last 3 Encounters:  05/23/2019 113.4 kg  04/18/2019 99.2 kg  04/25/2019 99.8 kg    No intake or output data in the 24 hours ending 05/09/19 1157   Physical Exam  Awake Alert, No new F.N deficits, Normal affect [place].AT,PERRAL Supple Neck,No JVD, No cervical lymphadenopathy appriciated.  Symmetrical Chest wall movement, Good air movement bilaterally, CTAB RRR,No Gallops, Rubs or new Murmurs, No Parasternal Heave +ve B.Sounds, Abd Soft, No tenderness, No organomegaly appriciated, No rebound - guarding or rigidity. No Cyanosis, Clubbing or edema, No new Rash or bruise    Data Review:    CBC Recent Labs  Lab 05/06/19 0244 05/06/19 0244 05/07/19 0611 05/16/2019 0323 05/05/2019 2226 05/09/19 0226 05/09/19 0710  WBC 9.5   < > 11.8* 11.4* 14.4* 12.8* 13.1*  HGB  1.1  °MONOABS 0.9  --  0.9 0.5 0.7  --  0.6  °EOSABS 0.0  --  0.0 0.0 0.0  --  0.0  °BASOSABS 0.0  --  0.0 0.0 0.1  --  0.0  ° < > = values in this interval not displayed.  ° ° °Chemistries  °Recent Labs  °Lab 04/14/2019 °1614 04/22/2019 °1715 05/04/19 °0424 05/04/19 °0424 05/05/19 °0452 05/05/19 °0452 05/06/19 °0244 05/06/19 °0244 05/07/19 °0611 05/09/2019 °0323 05/15/2019 °2226 05/09/19 °0226 05/09/19 °0710  °NA  --   --  141   < > 142   < > 143  --  141 143 137  --  140  °K  --   --  3.8   < > 3.8   < > 4.4  --  4.2 4.4 4.8  --  4.2  °CL  --   --  104   < > 105   < > 108  --  107 106 104  --  105  °CO2  --   --  23   < > 23   < > 25  --  25 27 21*  --  23  °GLUCOSE  --   --  132*   < > 128*   < > 134*  --  144* 110* 114*  --  128*  °BUN  --   --  16   < > 24*   < > 29*  --  26* 27* 30*  --  29*  °CREATININE  --    < > 1.09   < > 1.17   < > 1.18   < > 1.21 1.30* 1.37* 1.25* 1.25*  °CALCIUM  --   --  8.6*   < > 9.0   < > 9.0  --  8.8* 9.1 8.8*  --  8.7*  °AST  --   --  68*   < > 90*   < > 84*  --  85* 97* 123*  --  75*  °ALT  --   --  57*   < > 66*   < > 66*  --  96* 115* 172*  --  133*  °ALKPHOS  --   --  79   < > 76   < > 72  --  77 76 97  --  75  °BILITOT  --   --  0.5   < > 0.4   < > 0.4  --  0.4 0.7 1.2  --  0.8  °MG  --   --  2.3  --  2.4  --  2.4  --  2.5* 2.6*  --   --   --   °INR 0.9  --   --   --   --   --   --   --   --   --   --   --   --   ° < > = values in this interval not displayed.  ° °  °------------------------------------------------------------------------------------------------------------------ °No results for input(s): CHOL, HDL, LDLCALC, TRIG, CHOLHDL, LDLDIRECT in the last 72 hours. ° °Lab Results  °Component Value Date  ° HGBA1C  6.1 (A) 01/02/2019  ° °------------------------------------------------------------------------------------------------------------------ °No results for input(s): TSH, T4TOTAL, T3FREE, THYROIDAB in the last 72 hours. ° °Invalid input(s): FREET3 ° °Cardiac Enzymes °No results for input(s): CKMB, TROPONINI, MYOGLOBIN in the last 168 hours. ° °Invalid input(s): CK °------------------------------------------------------------------------------------------------------------------ °   °Component Value Date/Time  ° BNP 21.5 05/09/2019   0226  ° ° °Micro Results °Recent Results (from the past 240 hour(s))  °Blood Culture (routine x 2)     Status: None  ° Collection Time: 04/20/2019  4:35 PM  ° Specimen: BLOOD  °Result Value Ref Range Status  ° Specimen Description BLOOD RIGHT ANTECUBITAL  Final  ° Special Requests   Final  °  BOTTLES DRAWN AEROBIC AND ANAEROBIC Blood Culture adequate volume  ° Culture   Final  °  NO GROWTH 5 DAYS °Performed at [facility], [address] [place], [place] [address] °  ° Report Status 05/06/2019 FINAL  Final  °Blood Culture (routine x 2)     Status: None  ° Collection Time: 04/28/2019  4:51 PM  ° Specimen: BLOOD  °Result Value Ref Range Status  ° Specimen Description BLOOD LEFT ANTECUBITAL  Final  ° Special Requests   Final  °  BOTTLES DRAWN AEROBIC AND ANAEROBIC Blood Culture adequate volume  ° Culture   Final  °  NO GROWTH 5 DAYS °Performed at [facility], [address] [place], [place] [address] °  ° Report Status 05/11/2019 FINAL  Final  ° ° °Radiology Reports °DG Chest 2 View ° °Result Date: 04/21/2019 °CLINICAL DATA:  Shortness of breath EXAM: CHEST - 2 VIEW COMPARISON:  March 03, 2009 FINDINGS: There is ill-defined airspace consolidation in the left mid lung and left lower lung regions. There is also subtle opacity in the right perihilar region. There is mild scarring in the right base. Heart size and pulmonary vascularity are normal. No adenopathy. Postoperative change  noted in lower cervical region. IMPRESSION: Ill-defined airspace opacity left mid lung and left lower lung regions as well as more subtly in the right perihilar region. Appearance consistent with multifocal pneumonia. Mild atelectasis right base. Heart size normal.  No evident adenopathy. Electronically Signed   By: William  Woodruff III M.D.   On: 04/05/2019 13:25  ° °CT ANGIO CHEST PE W OR WO CONTRAST ° °Result Date: 05/05/2019 °CLINICAL DATA:  Shortness of breath. COVID positive. EXAM: CT ANGIOGRAPHY CHEST WITH CONTRAST TECHNIQUE: Multidetector CT imaging of the chest was performed using the standard protocol during bolus administration of intravenous contrast. Multiplanar CT image reconstructions and MIPs were obtained to evaluate the vascular anatomy. CONTRAST:  100mL OMNIPAQUE IOHEXOL 350 MG/ML SOLN COMPARISON:  Chest x-ray from same day. FINDINGS: Cardiovascular: Satisfactory opacification of the pulmonary arteries to the segmental level. No evidence of pulmonary embolism. Normal heart size. No pericardial effusion. No thoracic aortic aneurysm or dissection. Mediastinum/Nodes: No enlarged mediastinal, hilar, or axillary lymph nodes. Thyroid gland, trachea, and esophagus demonstrate no significant findings. Lungs/Pleura: Patchy bilateral somewhat confluent ground-glass densities with areas of septal thickening in the left greater than right upper and lower lobes. Areas of subpleural banding and architectural distortion in the right lung. No pleural effusion or pneumothorax. Upper Abdomen: No acute abnormality. Small to moderate hiatal hernia. Musculoskeletal: No chest wall abnormality. No acute or significant osseous findings. Review of the MIP images confirms the above findings. IMPRESSION: 1. No evidence of pulmonary embolism. 2. Multifocal pneumonia, consistent with history of COVID-19 infection. 3. Hiatal hernia. Electronically Signed   By: William T Derry M.D.   On: 05/05/2019 12:36  ° °DG Chest Port 1  View ° °Result Date: 05/22/2019 °CLINICAL DATA:  Shortness of breath. COVID pneumonia. EXAM: PORTABLE CHEST 1 VIEW COMPARISON:  Chest x-ray 05/05/2019 FINDINGS: The cardiac silhouette, mediastinal and hilar contours are stable. Persistent patchy ill-defined interstitial and   or significant osseous findings. Review of the MIP images confirms the above findings. IMPRESSION: 1. No evidence of pulmonary embolism. 2. Multifocal pneumonia, consistent with history of COVID-19 infection. 3. Hiatal hernia. Electronically Signed   By: Titus Dubin M.D.   On: 05/05/2019 12:36   DG Chest Port 1  View  Result Date: 05/26/2019 CLINICAL DATA:  Shortness of breath. COVID pneumonia. EXAM: PORTABLE CHEST 1 VIEW COMPARISON:  Chest x-ray 05/05/2019 FINDINGS: The cardiac silhouette, mediastinal and hilar contours are stable. Persistent patchy ill-defined interstitial and airspace infiltrates in both lungs, left worse than right. No definite pleural effusions. IMPRESSION: Persistent bilateral infiltrates, left worse than right. Electronically Signed   By: Marijo Sanes M.D.   On: 05/15/2019 08:06   DG Chest Port 1 View  Result Date: 05/05/2019 CLINICAL DATA:  Shortness of breath.  COVID positive. EXAM: PORTABLE CHEST 1 VIEW COMPARISON:  04/28/2019. FINDINGS: Mediastinum and hilar structures normal. Progressive infiltrate noted throughout the left lung. Mild infiltrate in the right upper lung. Bilateral subsegmental atelectasis. No pleural effusion or pneumothorax. Mild cardiomegaly. No pulmonary venous congestion. Sliding hiatal hernia. Thoracic spine scoliosis. No acute bony abnormality IMPRESSION: Progressive infiltrate noted throughout the left lung. Mild infiltrate right upper lung. Bilateral subsegmental atelectasis. Electronically Signed   By: Marcello Moores  Register   On: 05/05/2019 08:39   US Abdomen Limited RUQ  Result Date: 04/30/2019 CLINICAL DATA:  Elevated LFTs EXAM: ULTRASOUND ABDOMEN LIMITED RIGHT UPPER QUADRANT COMPARISON:  None. FINDINGS: Gallbladder: No gallstones or wall thickening visualized. No sonographic Murphy sign noted by sonographer. Common bile duct: Not well visualized Liver: Diffusely increased in echogenicity consistent with fatty infiltration. Portal vein is patent on color Doppler imaging with normal direction of blood flow towards the liver. Other: None. IMPRESSION: Common bile duct is not visualized on this exam although no intrahepatic ductal dilatation is seen. Normal-appearing gallbladder. Fatty liver. Electronically Signed   By: Inez Catalina M.D.   On: 04/17/2019 23:52

## 2019-05-09 NOTE — Telephone Encounter (Signed)
I will do my best.  Looks is that the patient left AMA and then was returned.  He left that appointment he was on 10 L of oxygen with a pulse ox less than 90%.  It is in his best interest to remain in the hospital.  I will attempt to make an effort to call him and his wife.

## 2019-05-09 NOTE — Telephone Encounter (Signed)
Patient's wife, Maudie Mercury, called.  Patient checked himself out of [facility] yesterday and pt was sent by ambulance back to [facility] last night.  Kim said his oxygen didn't register when ems got to the house.  Patient's very anxious and agitated. He feels like the room is getting smaller.  They had a psychiatrist come in this morning, but it didn't help.  Patient asked Maudie Mercury if Dr.Copland or [name] could call him.

## 2019-05-09 NOTE — Plan of Care (Signed)
  Problem: Clinical Measurements: Goal: Respiratory complications will improve Outcome: Progressing   

## 2019-05-09 NOTE — H&P (Addendum)
History and Physical    Matthew Eaton [id number] DOB: 09-21-1960 DOA: 05/22/2019  PCP: Owens Loffler, MD  Patient coming from: Home.  Chief Complaint: Shortness of breath.  HPI: Matthew Eaton is a 59 y.o. male with history of depression anxiety and chronic pain with hyperlipidemia presents to the ER again after signing out [place] last evening after being treated for COVID-19 for 5 days in the hospital.  During the hospitalization patient was treated with Actemra remdesivir at full dose and Decadron.  Patient states he got more short of breath but no chest pain or productive cough or fever or chills.  ED Course: In the ER patient is on nonrebreather this x-ray showing worsening infiltrates.  Labs show elevated D-dimer of 4.94 CBC shows WBC count of 14.4 troponin was 7 ferritin 363 CRP 1.9 creatinine 1.3 BNP pending.  Patient was restarted on Decadron admitted for further management of acute respiratory failure with hypoxia secondary Covid infection.  Review of Systems: As per HPI, rest all negative.   Past Medical History:  Diagnosis Date  . Chronic neck pain 05/20/2012  . Family hx of ALS (amyotrophic lateral sclerosis) 05/27/2013  . GERD (gastroesophageal reflux disease)   . Hiatal hernia   . HLD (hyperlipidemia)   . Major depressive disorder, recurrent episode, moderate ([place]) 05/27/2013  . Neck pain    cervical spine with herniation  . Tobacco abuse   . Wrist fracture, left     Past Surgical History:  Procedure Laterality Date  . NECK SURGERY     x2- 2010, 2011     reports that he quit smoking about 12 years ago. He quit after 30.00 years of use. He has never used smokeless tobacco. He reports that he does not drink alcohol or use drugs.  Allergies  Allergen Reactions  . Gabapentin Other (See Comments)    Severe tingling in the legs and weakness (of the legs)  . Methocarbamol Anaphylaxis, Shortness Of Breath and Other (See Comments)   Respiratory arrest- Turned purple    Family History  Problem Relation Age of Onset  . ALS Mother   . ALS Brother   . ALS Maternal Uncle   . ALS Maternal Aunt   . Colon cancer Neg Hx   . Esophageal cancer Neg Hx   . Rectal cancer Neg Hx   . Stomach cancer Neg Hx     Prior to Admission medications   Medication Sig Start Date End Date Taking? Authorizing Provider  aspirin 81 MG tablet Take 81 mg by mouth daily.      [provider]  atorvastatin (LIPITOR) 80 MG tablet TAKE 1 TABLET BY MOUTH DAILY AT 6 PM. Patient taking differently: Take 80 mg by mouth daily with supper.  09/12/18   Copland, Frederico Hamman, MD  buPROPion (WELLBUTRIN XL) 300 MG 24 hr tablet TAKE 1 TABLET BY MOUTH DAILY. Patient taking differently: Take 300 mg by mouth daily.  01/24/19   Copland, Frederico Hamman, MD  Cholecalciferol (VITAMIN D3) 50 MCG (2000 UT) TABS Take 2,000 Units by mouth daily with breakfast.    [provider]  citalopram (CELEXA) 40 MG tablet TAKE 1 TABLET BY MOUTH DAILY. Patient taking differently: Take 40 mg by mouth at bedtime.  01/09/19   Copland, Frederico Hamman, MD  clonazePAM (KLONOPIN) 0.5 MG tablet TAKE 1 TABLET BY MOUTH TWICE A DAY AS NEEDED Patient taking differently: Take 0.5 mg by mouth in the morning and at bedtime.  11/28/18   Owens Loffler, MD  HYDROcodone-acetaminophen (NORCO) 10-325 MG tablet TAKE 1 TABLET BY MOUTH EVERY 6 HOURS AS NEEDED FOR SEVERE PAIN. Patient not taking: Reported on 04/09/2019 04/03/19   Copland, Frederico Hamman, MD  HYDROcodone-acetaminophen ([place]) 10-325 MG tablet TAKE 1 TABLET BY MOUTH EVERY 6 HOURS AS NEEDED FOR SEVERE PAIN Patient taking differently: Take 0.5-1 tablets by mouth every 6 (six) hours as needed for severe pain.  04/03/19   Copland, Frederico Hamman, MD  HYDROcodone-acetaminophen (NORCO) 10-325 MG tablet Take 1 tablet by mouth every 6 (six) hours as needed for severe pain. Patient not taking: Reported on 04/07/2019 04/03/19   Owens Loffler, MD  ibuprofen (ADVIL) 200  MG tablet Take 400 mg by mouth every 6 (six) hours as needed (for headaches).    [provider]  Melatonin 10 MG TABS Take 10-20 mg by mouth at bedtime as needed (for sleep).    [provider]  nortriptyline (PAMELOR) 25 MG capsule Take 1 capsule (25 mg total) by mouth at bedtime. Patient not taking: Reported on 04/14/2019 05/04/18   Copland, Frederico Hamman, MD  omeprazole (PRILOSEC) 20 MG capsule TAKE 1 CAPSULE BY MOUTH 2 TIMES DAILY BEFORE A MEAL. Patient taking differently: Take 20 mg by mouth 2 (two) times daily before a meal.  09/16/18   Copland, Frederico Hamman, MD    Physical Exam: Constitutional: Moderately built and nourished. Vitals:   05/09/19 0015 05/09/19 0030 05/09/19 0045 05/09/19 0100  BP: 111/89 (!) 113/102 117/74 115/71  Pulse: 75 79 83 81  Resp: (!) 25 (!) 25 (!) 30 (!) 28  SpO2: 91% (!) 89% 97% 95%  Weight:      Height:       Eyes: Anicteric no pallor. ENMT: No discharge from the ears eyes nose or mouth. Neck: No mass felt.  No neck rigidity. Respiratory: No rhonchi or crepitations. Cardiovascular: S1-S2 heard. Abdomen: Soft nontender bowel sound present. Musculoskeletal: No edema. Skin: No rash. Neurologic: Alert awake oriented to time place and person.  Moves all extremities. Psychiatric: Appears normal per normal affect.   Labs on Admission: I have personally reviewed following labs and imaging studies  CBC: Recent Labs  Lab 05/05/19 0452 05/06/19 0244 05/07/19 0611 05/12/2019 0323 06/01/2019 2226  WBC 5.5 9.5 11.8* 11.4* 14.4*  NEUTROABS 4.0 7.1 9.0* 8.5* 11.9*  HGB 12.1* 11.8* 12.8* 13.5 14.8  HCT 38.1* 37.1* 40.0 41.7 45.9  MCV 93.6 93.9 93.0 93.3 94.6  PLT 296 328 378 437* [id number]*   Basic Metabolic Panel: Recent Labs  Lab 05/04/19 0424 05/04/19 0424 05/05/19 0452 05/06/19 0244 05/07/19 0611 05/23/2019 0323 05/17/2019 2226  NA 141   < > 142 143 141 143 137  K 3.8   < > 3.8 4.4 4.2 4.4 4.8  CL 104   < > 105 108 107 106 104  CO2 23   < > 23  25 25 27  21*  GLUCOSE 132*   < > 128* 134* 144* 110* 114*  BUN 16   < > 24* 29* 26* 27* 30*  CREATININE 1.09   < > 1.17 1.18 1.21 1.30* 1.37*  CALCIUM 8.6*   < > 9.0 9.0 8.8* 9.1 8.8*  MG 2.3  --  2.4 2.4 2.5* 2.6*  --   PHOS 3.4  --   --   --   --   --   --    < > = values in this interval not displayed.   GFR: Estimated Creatinine Clearance: 74.2 mL/min (A) (by C-G formula based on SCr of 1.37  mg/dL (H)). Liver Function Tests: Recent Labs  Lab 05/05/19 0452 05/06/19 0244 05/07/19 0611 05/06/2019 0323 05/22/2019 2226  AST 90* 84* 85* 97* 123*  ALT 66* 66* 96* 115* 172*  ALKPHOS 76 72 77 76 97  BILITOT 0.4 0.4 0.4 0.7 1.2  PROT 6.0* 5.6* 5.8* 6.0* 6.2*  ALBUMIN 2.8* 2.6* 2.9* 3.0* 3.2*   No results for input(s): LIPASE, AMYLASE in the last 168 hours. No results for input(s): AMMONIA in the last 168 hours. Coagulation Profile: Recent Labs  Lab 05/02/2019 1614  INR 0.9   Cardiac Enzymes: No results for input(s): CKTOTAL, CKMB, CKMBINDEX, TROPONINI in the last 168 hours. BNP (last 3 results) No results for input(s): PROBNP in the last 8760 hours. HbA1C: No results for input(s): HGBA1C in the last 72 hours. CBG: No results for input(s): GLUCAP in the last 168 hours. Lipid Profile: No results for input(s): CHOL, HDL, LDLCALC, TRIG, CHOLHDL, LDLDIRECT in the last 72 hours. Thyroid Function Tests: No results for input(s): TSH, T4TOTAL, FREET4, T3FREE, THYROIDAB in the last 72 hours. Anemia Panel: Recent Labs    05/15/2019 2226  FERRITIN 363*   Urine analysis:    Component Value Date/Time   COLORURINE YELLOW 04/09/2019 [place] 04/09/2019 1633   LABSPEC 1.026 04/10/2019 1633   PHURINE 6.0 05/01/2019 1633   GLUCOSEU NEGATIVE 04/26/2019 1633   HGBUR NEGATIVE 04/18/2019 1633   BILIRUBINUR NEGATIVE 04/26/2019 1633   [place] 04/23/2019 1633   PROTEINUR 30 (A) 04/11/2019 1633   NITRITE NEGATIVE 04/17/2019 1633   LEUKOCYTESUR NEGATIVE 04/18/2019  1633   Sepsis Labs: @LABRCNTIP (procalcitonin:4,lacticidven:4) ) Recent Results (from the past 240 hour(s))  Blood Culture (routine x 2)     Status: None   Collection Time: 04/06/2019  4:35 PM   Specimen: BLOOD  Result Value Ref Range Status   Specimen Description BLOOD RIGHT ANTECUBITAL  Final   Special Requests   Final    BOTTLES DRAWN AEROBIC AND ANAEROBIC Blood Culture adequate volume   Culture   Final    NO GROWTH 5 DAYS Performed at [facility], [place] [address], [place], [place] [address]    Report Status 05/22/2019 FINAL  Final  Blood Culture (routine x 2)     Status: None   Collection Time: 04/12/2019  4:51 PM   Specimen: BLOOD  Result Value Ref Range Status   Specimen Description BLOOD LEFT ANTECUBITAL  Final   Special Requests   Final    BOTTLES DRAWN AEROBIC AND ANAEROBIC Blood Culture adequate volume   Culture   Final    NO GROWTH 5 DAYS Performed at [facility], [place] [address] [place], [place] [address]    Report Status 05/13/2019 FINAL  Final     Radiological Exams on Admission: DG Chest Port 1 View  Result Date: 05/10/2019 CLINICAL DATA:  Shortness of breath. COVID pneumonia. EXAM: PORTABLE CHEST 1 VIEW COMPARISON:  Chest x-ray 05/05/2019 FINDINGS: The cardiac silhouette, mediastinal and hilar contours are stable. Persistent patchy ill-defined interstitial and airspace infiltrates in both lungs, left worse than right. No definite pleural effusions. IMPRESSION: Persistent bilateral infiltrates, left worse than right. Electronically Signed   By: Marijo Sanes M.D.   On: 05/27/2019 08:06    EKG: Independently reviewed.  Note of artifacts will need to repeat.  Assessment/Plan Principal Problem:   Acute hypoxemic respiratory failure due to COVID-19 [facility]) Active Problems:   Hyperlipidemia   Generalized anxiety disorder   Acute respiratory failure with  hypoxia ([place])   ARF (acute renal failure) ([place])    1. Acute hypoxic respiratory failure secondary to  Covid infection with chest x-ray showing worsening infiltrates.  Since patient's D-dimer has worsened we will get a CT angiogram of the chest and also Dopplers of the lower extremity.  Continue the rest of the Decadron dose.  Patient during the last state that is before signing out [place] yesterday patient has received a full dose of remdesivir and also received Actemra. 2. Hyperlipidemia on statin. 3. Denies anxiety disorder and depression we will resume home dose Klonopin and Celexa. 4. Chronic pain on Norco.  Elevated creatinine has been noted since last admission closely monitor.  Given the acute respiratory failure with hypoxia and Covid infection will need close monitoring for any further worsening and inpatient status.   DVT prophylaxis: Lovenox. Code Status: Full. Family Communication: Discussed with patient. Disposition Plan: Home. Consults called: None. Admission status: Inpatient.   Rise Patience MD [facility] Pager [phone].  If 7PM-7AM, please contact night-coverage [email or web address] Password [email or web address]  05/09/2019, 1:14 AM

## 2019-05-09 NOTE — Telephone Encounter (Signed)
Would you mind calling Matthew Eaton.  He absolutely has to stay in the hospital.  If he leaves he very well might pass away.  He is in respiratory failure and on extremely high levels of oxygen.  He really cannot leave the hospital until they say that he can.  I know he has some anxiety, but this is a life or death situation.  On my review of the hospital notes, it looks like he does have some anxiety medicine that he can use.  I would ask for it from the nursing staff if he needs more, tell the hospital doctors that he is still very anxious.  I hope that he does okay and I will be thinking.

## 2019-05-09 NOTE — Progress Notes (Signed)
ANTICOAGULATION CONSULT NOTE - Follow Up Consult  Pharmacy Consult for Lovenox Indication: pulmonary embolus (rule out)  Allergies  Allergen Reactions  . Gabapentin Other (See Comments)    Severe tingling in the legs and weakness (of the legs)  . Methocarbamol Anaphylaxis, Shortness Of Breath and Other (See Comments)    Respiratory arrest- Turned purple    Patient Measurements: Height: 5\' 10"  (177.8 cm) Weight: 113.4 kg (250 lb) IBW/kg (Calculated) : 73  Vital Signs: Temp: 98 F (36.7 C) (04/06 0800) Temp Source: Oral (04/06 0800) BP: 120/80 (04/06 0800) Pulse Rate: 70 (04/06 0800)  Labs: Recent Labs    05/09/2019 2226 05/16/2019 2226 05/09/19 0226 05/09/19 0710  HGB 14.8   < > 12.9* 13.1  HCT 45.9  --  39.6 40.4  PLT 464*  --  387 383  CREATININE 1.37*  --  1.25* 1.25*  TROPONINIHS 7  --   --   --    < > = values in this interval not displayed.    Estimated Creatinine Clearance: 81.3 mL/min (A) (by C-G formula based on SCr of 1.25 mg/dL (H)).  Assessment: 59 year old male with COVID and rapidly rising D-Dimer to begin Lovenox for rule out PE Scr stable  Goal of Therapy:  Anti-Xa level 0.6-1 units/ml 4hrs after LMWH dose given Monitor platelets by anticoagulation protocol: Yes   Plan:  Lovenox 110 mg sq Q 12 hours Follow up CT angiogram, Scr  Thank you Anette Guarneri, PharmD  05/09/2019,9:04 AM

## 2019-05-09 NOTE — Progress Notes (Signed)
RT instructed patient on the use of flutter valve.  Patient has used a flutter valve before and was able to tell me how it's used.  RN at bedside.

## 2019-05-09 NOTE — Plan of Care (Signed)
  Problem: Education: Goal: Knowledge of General Education information will improve Description: Including pain rating scale, medication(s)/side effects and non-pharmacologic comfort measures Outcome: Progressing   Problem: Health Behavior/Discharge Planning: Goal: Ability to manage health-related needs will improve Outcome: Progressing   Problem: Clinical Measurements: Goal: Ability to maintain clinical measurements within normal limits will improve Outcome: Progressing Goal: Diagnostic test results will improve Outcome: Progressing Goal: Respiratory complications will improve Outcome: Progressing Goal: Cardiovascular complication will be avoided Outcome: Progressing   Problem: Activity: Goal: Risk for activity intolerance will decrease Outcome: Progressing   Problem: Coping: Goal: Level of anxiety will decrease Outcome: Progressing   Problem: Elimination: Goal: Will not experience complications related to bowel motility Outcome: Progressing Goal: Will not experience complications related to urinary retention Outcome: Progressing   Problem: Pain Managment: Goal: General experience of comfort will improve Outcome: Progressing   Problem: Safety: Goal: Ability to remain free from injury will improve Outcome: Progressing   Problem: Education: Goal: Knowledge of risk factors and measures for prevention of condition will improve Outcome: Progressing   Problem: Coping: Goal: Psychosocial and spiritual needs will be supported Outcome: Progressing   Problem: Respiratory: Goal: Will maintain a patent airway Outcome: Progressing Goal: Complications related to the disease process, condition or treatment will be avoided or minimized Outcome: Progressing

## 2019-05-10 ENCOUNTER — Inpatient Hospital Stay (HOSPITAL_COMMUNITY): Payer: PPO

## 2019-05-10 DIAGNOSIS — R609 Edema, unspecified: Secondary | ICD-10-CM

## 2019-05-10 DIAGNOSIS — I5031 Acute diastolic (congestive) heart failure: Secondary | ICD-10-CM

## 2019-05-10 LAB — COMPREHENSIVE METABOLIC PANEL
ALT: 141 U/L — ABNORMAL HIGH (ref 0–44)
AST: 84 U/L — ABNORMAL HIGH (ref 15–41)
Albumin: 2.9 g/dL — ABNORMAL LOW (ref 3.5–5.0)
Alkaline Phosphatase: 80 U/L (ref 38–126)
Anion gap: 14 (ref 5–15)
BUN: 26 mg/dL — ABNORMAL HIGH (ref 6–20)
CO2: 20 mmol/L — ABNORMAL LOW (ref 22–32)
Calcium: 8.4 mg/dL — ABNORMAL LOW (ref 8.9–10.3)
Chloride: 103 mmol/L (ref 98–111)
Creatinine, Ser: 1.23 mg/dL (ref 0.61–1.24)
GFR calc Af Amer: >60 mL/min (ref >60)
GFR calc non Af Amer: >60 mL/min (ref >60)
Glucose, Bld: 124 mg/dL — ABNORMAL HIGH (ref 70–99)
Potassium: 4 mmol/L (ref 3.5–5.1)
Sodium: 137 mmol/L (ref 135–145)
Total Bilirubin: 0.8 mg/dL (ref 0.3–1.2)
Total Protein: 5.4 g/dL — ABNORMAL LOW (ref 6.5–8.1)

## 2019-05-10 LAB — D-DIMER, QUANTITATIVE: D-Dimer, Quant: 3.11 ug/mL-FEU — ABNORMAL HIGH (ref 0.00–0.50)

## 2019-05-10 LAB — CBC WITH DIFFERENTIAL/PLATELET
Abs Immature Granulocytes: 0.26 10*3/uL — ABNORMAL HIGH (ref 0.00–0.07)
Basophils Absolute: 0 10*3/uL (ref 0.0–0.1)
Basophils Relative: 0 %
Eosinophils Absolute: 0.1 10*3/uL (ref 0.0–0.5)
Eosinophils Relative: 1 %
HCT: 41.3 % (ref 39.0–52.0)
Hemoglobin: 13.2 g/dL (ref 13.0–17.0)
Immature Granulocytes: 2 %
Lymphocytes Relative: 8 %
Lymphs Abs: 0.9 10*3/uL (ref 0.7–4.0)
MCH: 29.8 pg (ref 26.0–34.0)
MCHC: 32 g/dL (ref 30.0–36.0)
MCV: 93.2 fL (ref 80.0–100.0)
Monocytes Absolute: 0.3 10*3/uL (ref 0.1–1.0)
Monocytes Relative: 2 %
Neutro Abs: 10 10*3/uL — ABNORMAL HIGH (ref 1.7–7.7)
Neutrophils Relative %: 87 %
Platelets: 389 10*3/uL (ref 150–400)
RBC: 4.43 MIL/uL (ref 4.22–5.81)
RDW: 13.8 % (ref 11.5–15.5)
WBC: 11.5 10*3/uL — ABNORMAL HIGH (ref 4.0–10.5)
nRBC: 0.3 % — ABNORMAL HIGH (ref 0.0–0.2)

## 2019-05-10 LAB — ECHOCARDIOGRAM LIMITED
Height: 70 in
Weight: 4000 oz

## 2019-05-10 LAB — BRAIN NATRIURETIC PEPTIDE: B Natriuretic Peptide: 17.2 pg/mL (ref 0.0–100.0)

## 2019-05-10 LAB — C-REACTIVE PROTEIN: CRP: 0.7 mg/dL (ref <1.0)

## 2019-05-10 LAB — MAGNESIUM: Magnesium: 2.6 mg/dL — ABNORMAL HIGH (ref 1.7–2.4)

## 2019-05-10 MED ORDER — FUROSEMIDE 10 MG/ML IJ SOLN
40.0000 mg | Freq: Once | INTRAMUSCULAR | Status: AC
  Administered 2019-05-10: 40 mg via INTRAVENOUS
  Filled 2019-05-10: qty 4

## 2019-05-10 NOTE — Telephone Encounter (Signed)
The patient and his wife wanted me to speak with them.  I spoke to his wife Maudie Mercury.  The patient has been having some severe anxiety in the hospital.  She was not clear if he was taking his regular medicine.  Looked in the chart, it looks like he is taking his regular medicine.  He is also taking scheduled Klonopin and has some Xanax as needed.  Tried to reassure her.  I emphasized that if he left the hospital again he very well might die.  If they have specific questions, then discussing this with the inpatient team would be the most reasonable step.

## 2019-05-10 NOTE — Progress Notes (Signed)
PROGRESS NOTE    Matthew Eaton  [id number] DOB: [birth year] DOA: 05/29/2019 PCP: Owens Loffler, MD    Brief Narrative:  Matthew Yohey Phillipsis a 59 y.o.malewith medical history significant ofhyperlipidemia, chronic pain syndrome, anxiety, former smoker, GERD, depression presented to emergency department due to shortness of breath with exertion since 2 days, was diagnosed with acute hypoxic respiratory failure due to COVID-19 pneumonia and admitted to the hospital. Patient was in the hospital, was still hypoxic , signed out AMA , left and came back to ER with severe shortness of breath.   Assessment & Plan:   Principal Problem:   Acute hypoxemic respiratory failure due to COVID-19 [facility]) Active Problems:   Hyperlipidemia   Generalized anxiety disorder   Acute respiratory failure with hypoxia (HCC)   ARF (acute renal failure) (HCC)  Acute hypoxemic respiratory failure due to COVID-19 pneumonia: Admitted 3/31-4/5 with acute pneumonia due to COVID-19 and treated with steroids, remdesivir, Actemra on 4/1. CTA negative for pulmonary embolism, extensive multifocal pneumonia.  Echocardiogram with normal ejection fraction, grade 1 diastolic dysfunction.  Duplex is negative for DVT. Continue to monitor due to significant symptoms, requiring high flow oxygen. Aggressive chest physiotherapy, incentive spirometry, deep breathing exercises, sputum induction, mucolytic's and bronchodilators. Supplemental oxygen to keep saturations more than 90%. Covid directed therapy with , steroids, continue dexamethasone remdesivir, finished therapy actemra, 4/1 On therapeutic Lovenox Intermittent diuresis, will give 1 additional dose of Lasix 40 mg IV today. Due to severity of symptoms, patient will need daily inflammatory markers, chest x-rays, liver function test to monitor and direct COVID-19 therapies.  Out of bed.  Anxiety medications.  History of anxiety and depression: On multiple  medications including Celexa, Wellbutrin,, nortriptyline and Klonopin that will be continued.  Chronic neck pain: On chronic narcotics.  Continued.  DVT prophylaxis: Lovenox subcu Code Status: Full code Family Communication: Wife on the phone Disposition Plan: patient is from home. Anticipated DC to home, Barriers to discharge, still on 15 L of oxygen   Consultants:   None  Procedures:   None  Antimicrobials:  Antibiotics Given (last 72 hours)    None         Subjective: Patient was seen and examined.  Overnight remained on 15 L of oxygen.  Feels anxious otherwise he is okay.  He was able to keep up conversation.  He thinks he will stay in the hospital until he finishes treatment.  He was wondering whether he needs oxygen to go home.  Afebrile overnight.  No cough.  Objective: Vitals:   05/10/19 0310 05/10/19 0420 05/10/19 0445 05/10/19 0740  BP:    114/62  Pulse:  73  72  Resp:  20 19 (!) 25  Temp:  98 F (36.7 C) 98.4 F (36.9 C) 97.8 F (36.6 C)  TempSrc: Oral Oral Oral Oral  SpO2:  90%  93%  Weight:      Height:        Intake/Output Summary (Last 24 hours) at 05/10/2019 1502 Last data filed at 05/10/2019 0700 Gross per 24 hour  Intake 810 ml  Output 400 ml  Net 410 ml   Filed Weights   05/21/2019 2131  Weight: 113.4 kg    Examination:  General exam: Appears calm, mild respiratory discomfort. Respiratory system: Clear to auscultation.  Mild respiratory distress.  No added sounds. Cardiovascular system: S1 & S2 heard, RRR.  Gastrointestinal system: Abdomen is nondistended, soft and nontender. No organomegaly or masses felt.  Central nervous system: Alert and  oriented. No focal neurological deficits. Extremities: Symmetric 5 x 5 power. Skin: No rashes, lesions or ulcers Psychiatry: Judgement and insight appear normal. Mood & affect anxious.    Data Reviewed: I have personally reviewed following labs and imaging studies  CBC: Recent Labs  Lab  05/07/19 0611 05/07/19 0611 05/24/2019 0323 05/07/2019 2226 05/09/19 0226 05/09/19 0710 05/10/19 0442  WBC 11.8*   < > 11.4* 14.4* 12.8* 13.1* 11.5*  NEUTROABS 9.0*  --  8.5* 11.9*  --  11.1* 10.0*  HGB 12.8*   < > 13.5 14.8 12.9* 13.1 13.2  HCT 40.0   < > 41.7 45.9 39.6 40.4 41.3  MCV 93.0   < > 93.3 94.6 92.3 93.3 93.2  PLT 378   < > 437* 464* 387 383 389   < > = values in this interval not displayed.   Basic Metabolic Panel: Recent Labs  Lab 05/04/19 0424 05/04/19 0424 05/05/19 [id number] 05/05/19 [id number] 05/06/19 0244 05/06/19 0244 05/07/19 [id number] 05/07/19 [id number] 05/14/2019 0323 05/31/2019 2226 05/09/19 0226 05/09/19 0710 05/10/19 0442  NA 141   < > 142   < > 143   < > 141  --  143 137  --  140 137  K 3.8   < > 3.8   < > 4.4   < > 4.2  --  4.4 4.8  --  4.2 4.0  CL 104   < > 105   < > 108   < > 107  --  106 104  --  105 103  CO2 23   < > 23   < > 25   < > 25  --  27 21*  --  23 20*  GLUCOSE 132*   < > 128*   < > 134*   < > 144*  --  110* 114*  --  128* 124*  BUN 16   < > 24*   < > 29*   < > 26*  --  27* 30*  --  29* 26*  CREATININE 1.09   < > 1.17   < > 1.18   < > 1.21   < > 1.30* 1.37* 1.25* 1.25* 1.23  CALCIUM 8.6*   < > 9.0   < > 9.0   < > 8.8*  --  9.1 8.8*  --  8.7* 8.4*  MG 2.3   < > 2.4  --  2.4  --  2.5*  --  2.6*  --   --   --  2.6*  PHOS 3.4  --   --   --   --   --   --   --   --   --   --   --   --    < > = values in this interval not displayed.   GFR: Estimated Creatinine Clearance: 82.6 mL/min (by C-G formula based on SCr of 1.23 mg/dL). Liver Function Tests: Recent Labs  Lab 05/07/19 0611 05/25/2019 0323 05/15/2019 2226 05/09/19 0710 05/10/19 0442  AST 85* 97* 123* 75* 84*  ALT 96* 115* 172* 133* 141*  ALKPHOS 77 76 97 75 80  BILITOT 0.4 0.7 1.2 0.8 0.8  PROT 5.8* 6.0* 6.2* 5.4* 5.4*  ALBUMIN 2.9* 3.0* 3.2* 2.9* 2.9*   No results for input(s): LIPASE, AMYLASE in the last 168 hours. No results for input(s): AMMONIA in the last 168 hours. Coagulation  Profile: Recent Labs  Lab 04/07/2019 1614  INR 0.9   Cardiac Enzymes: No  results for input(s): CKTOTAL, CKMB, CKMBINDEX, TROPONINI in the last 168 hours. BNP (last 3 results) No results for input(s): PROBNP in the last 8760 hours. HbA1C: No results for input(s): HGBA1C in the last 72 hours. CBG: No results for input(s): GLUCAP in the last 168 hours. Lipid Profile: No results for input(s): CHOL, HDL, LDLCALC, TRIG, CHOLHDL, LDLDIRECT in the last 72 hours. Thyroid Function Tests: No results for input(s): TSH, T4TOTAL, FREET4, T3FREE, THYROIDAB in the last 72 hours. Anemia Panel: Recent Labs    05/11/2019 2226 05/09/19 0710  FERRITIN 363* 242   Sepsis Labs: Recent Labs  Lab 05/02/2019 1614 04/23/2019 1715 05/06/19 0244 05/07/19 0611 05/24/2019 0323 05/09/19 0710  PROCALCITON  --    < > <0.10 <0.10 <0.10 <0.10  LATICACIDVEN 1.8  --   --   --   --   --    < > = values in this interval not displayed.    Recent Results (from the past 240 hour(s))  Blood Culture (routine x 2)     Status: None   Collection Time: 04/03/2019  4:35 PM   Specimen: BLOOD  Result Value Ref Range Status   Specimen Description BLOOD RIGHT ANTECUBITAL  Final   Special Requests   Final    BOTTLES DRAWN AEROBIC AND ANAEROBIC Blood Culture adequate volume   Culture   Final    NO GROWTH 5 DAYS Performed at [facility], [address] [place], [place] [address]    Report Status 05/17/2019 FINAL  Final  Blood Culture (routine x 2)     Status: None   Collection Time: 04/28/2019  4:51 PM   Specimen: BLOOD  Result Value Ref Range Status   Specimen Description BLOOD LEFT ANTECUBITAL  Final   Special Requests   Final    BOTTLES DRAWN AEROBIC AND ANAEROBIC Blood Culture adequate volume   Culture   Final    NO GROWTH 5 DAYS Performed at [facility], [place] [address] [place], [place] [address]    Report Status 05/14/2019 FINAL  Final         Radiology Studies: CT ANGIO CHEST PE W OR WO  CONTRAST  Result Date: 05/09/2019 CLINICAL DATA:  Shortness of breath.  COVID-19 positive EXAM: CT ANGIOGRAPHY CHEST WITH CONTRAST TECHNIQUE: Multidetector CT imaging of the chest was performed using the standard protocol during bolus administration of intravenous contrast. Multiplanar CT image reconstructions and MIPs were obtained to evaluate the vascular anatomy. CONTRAST:  70 mL OMNIPAQUE IOHEXOL 350 MG/ML SOLN COMPARISON:  May 05, 2019 CT angiogram chest; chest radiograph May 08, 2019 FINDINGS: Cardiovascular: There is no demonstrable pulmonary embolus. There is no thoracic aortic aneurysm or dissection. There are scattered foci in visualized great vessels. There are scattered foci of aortic atherosclerosis. There is no pericardial effusion or pericardial thickening. Mediastinum/Nodes: Thyroid appears unremarkable. There is no evident thoracic adenopathy. There is a focal moderate hiatal hernia, stable. Lungs/Pleura: There is underlying centrilobular emphysematous change. There is widespread ground-glass type opacity throughout the lungs fairly diffusely with overall increased airspace opacity in the right middle lobe compared to recent CT with similar changes elsewhere. Note that there is extensive consolidation throughout most of the left lung, stable. No frank consolidation evident. No pleural effusions are appreciable. Upper Abdomen: Visualized upper abdominal structures appear unremarkable. Musculoskeletal: Postoperative changes noted in the lower cervical spine. There are no blastic or lytic bone lesions. There are no evident chest wall lesions. Review of the MIP images confirms  the above findings. IMPRESSION: 1. No demonstrable pulmonary embolus. No thoracic aortic aneurysm. There are foci of aortic atherosclerosis. 2. Widespread ground-glass type opacity consistent with extensive multifocal pneumonia, slightly increased in the right middle lobe and stable albeit extensive elsewhere. Overall there is  more infiltrate on the left than on the right, stable. Underlying degree of emphysema. 3.  No evident adenopathy. 4.  Stable moderate hiatal hernia. 5.  No evident adenopathy. Aortic Atherosclerosis (ICD10-I70.0) and Emphysema (ICD10-J43.9). Electronically Signed   By: Lowella Grip III M.D.   On: 05/09/2019 14:13   VAS [place] LOWER EXTREMITY VENOUS (DVT)  Result Date: 05/10/2019  Lower Venous DVTStudy Indications: Swelling.  Comparison Study: no prior Performing Technologist: June Leap RDMS, RVT  Examination Guidelines: A complete evaluation includes B-mode imaging, spectral Doppler, color Doppler, and power Doppler as needed of all accessible portions of each vessel. Bilateral testing is considered an integral part of a complete examination. Limited examinations for reoccurring indications may be performed as noted. The reflux portion of the exam is performed with the patient in reverse Trendelenburg.  +---------+---------------+---------+-----------+----------+--------------+ RIGHT    CompressibilityPhasicitySpontaneityPropertiesThrombus Aging +---------+---------------+---------+-----------+----------+--------------+ CFV      Full           Yes      Yes                                 +---------+---------------+---------+-----------+----------+--------------+ SFJ      Full                                                        +---------+---------------+---------+-----------+----------+--------------+ FV Prox  Full                                                        +---------+---------------+---------+-----------+----------+--------------+ FV Mid   Full                                                        +---------+---------------+---------+-----------+----------+--------------+ FV DistalFull                                                        +---------+---------------+---------+-----------+----------+--------------+ PFV      Full                                                         +---------+---------------+---------+-----------+----------+--------------+ POP      Full           Yes      Yes                                 +---------+---------------+---------+-----------+----------+--------------+  PTV      Full                                                        +---------+---------------+---------+-----------+----------+--------------+ PERO     Full                                                        +---------+---------------+---------+-----------+----------+--------------+   +---------+---------------+---------+-----------+----------+--------------+ LEFT     CompressibilityPhasicitySpontaneityPropertiesThrombus Aging +---------+---------------+---------+-----------+----------+--------------+ CFV      Full           Yes      Yes                                 +---------+---------------+---------+-----------+----------+--------------+ SFJ      Full                                                        +---------+---------------+---------+-----------+----------+--------------+ FV Prox  Full                                                        +---------+---------------+---------+-----------+----------+--------------+ FV Mid   Full                                                        +---------+---------------+---------+-----------+----------+--------------+ FV DistalFull                                                        +---------+---------------+---------+-----------+----------+--------------+ PFV      Full                                                        +---------+---------------+---------+-----------+----------+--------------+ POP      Full           Yes      Yes                                 +---------+---------------+---------+-----------+----------+--------------+ PTV      Full                                                         +---------+---------------+---------+-----------+----------+--------------+  PERO     Full                                                        +---------+---------------+---------+-----------+----------+--------------+     Summary: RIGHT: - There is no evidence of deep vein thrombosis in the lower extremity.  - No cystic structure found in the popliteal fossa.  LEFT: - There is no evidence of deep vein thrombosis in the lower extremity.  - No cystic structure found in the popliteal fossa.  *See table(s) above for measurements and observations.    Preliminary    ECHOCARDIOGRAM LIMITED  Result Date: 05/10/2019    ECHOCARDIOGRAM REPORT   Patient Name:   [facility] DAVID Durrett Date of Exam: 05/10/2019 Medical Rec #:  [id number]           Height:       70.0 in Accession #:    [id number]          Weight:       250.0 lb Date of Birth:  05/24/1960           BSA:          2.295 m Patient Age:    48 years            BP:           114/62 mmHg Patient Gender: M                   HR:           72 bpm. Exam Location:  Inpatient Procedure: 2D Echo, Cardiac Doppler and Color Doppler Indications:    CHF-Acute Diastolic 0000000  History:        Patient has no prior history of Echocardiogram examinations.                 Risk Factors:Dyslipidemia, Diabetes and Former Smoker. Acute                 renal failure. Acute hypoxemic respiratory failure due to                 COVID-19. GERD.  Sonographer:    Clayton Lefort RDCS (AE) Referring Phys: [id number] Margaree Mackintosh [facility]  Sonographer Comments: Technically challenging study due to limited acoustic windows. COVID-19. IMPRESSIONS  1. Left ventricular ejection fraction, by estimation, is 60 to 65%. The left ventricle has normal function. The left ventricle has no regional wall motion abnormalities. There is mild left ventricular hypertrophy. Left ventricular diastolic parameters are consistent with Grade I diastolic dysfunction (impaired relaxation).  2. Right ventricular systolic  function is low normal. The right ventricular size is normal.  3. The mitral valve is grossly normal. Trivial mitral valve regurgitation.  4. The aortic valve is tricuspid. Aortic valve regurgitation is not visualized. Mild aortic valve sclerosis is present, with no evidence of aortic valve stenosis. FINDINGS  Left Ventricle: Left ventricular ejection fraction, by estimation, is 60 to 65%. The left ventricle has normal function. The left ventricle has no regional wall motion abnormalities. The left ventricular internal cavity size was normal in size. There is  mild left ventricular hypertrophy. Left ventricular diastolic parameters are consistent with Grade I diastolic dysfunction (impaired relaxation). Normal left ventricular filling pressure. Right Ventricle: The right ventricular size is normal. No increase in right  ventricular wall thickness. Right ventricular systolic function is low normal. Left Atrium: Left atrial size was normal in size. Right Atrium: Right atrial size was normal in size. Pericardium: There is no evidence of pericardial effusion. Mitral Valve: The mitral valve is grossly normal. Trivial mitral valve regurgitation. Tricuspid Valve: The tricuspid valve is grossly normal. Tricuspid valve regurgitation is trivial. Aortic Valve: The aortic valve is tricuspid. Aortic valve regurgitation is not visualized. Mild aortic valve sclerosis is present, with no evidence of aortic valve stenosis. Aortic valve mean gradient measures 4.0 mmHg. Pulmonic Valve: The pulmonic valve was normal in structure. Pulmonic valve regurgitation is not visualized. Aorta: The aortic root and ascending aorta are structurally normal, with no evidence of dilitation. Venous: The inferior vena cava was not well visualized. IAS/Shunts: No atrial level shunt detected by color flow Doppler.  LEFT VENTRICLE PLAX 2D LVIDd:         4.10 cm  Diastology LVIDs:         2.70 cm  LV e' lateral:   9.90 cm/s LV PW:         1.20 cm  LV E/e'  lateral: 3.9 LV IVS:        1.00 cm  LV e' medial:    6.53 cm/s LVOT diam:     1.90 cm  LV E/e' medial:  6.0 LV SV:         39 LV SV Index:   17 LVOT Area:     2.84 cm  LEFT ATRIUM         Index LA diam:    2.20 cm 0.96 cm/m  AORTIC VALVE AV Area (Vmax):    1.77 cm AV Area (Vmean):   1.68 cm AV Area (VTI):     1.66 cm AV Vmax:           145.00 cm/s AV Vmean:          87.900 cm/s AV VTI:            0.234 m AV Peak Grad:      8.4 mmHg AV Mean Grad:      4.0 mmHg LVOT Vmax:         90.70 cm/s LVOT Vmean:        52.200 cm/s LVOT VTI:          0.137 m LVOT/AV VTI ratio: 0.59  AORTA Ao Root diam: 3.30 cm MITRAL VALVE               TRICUSPID VALVE MV Area (PHT): 2.56 cm    TR Peak grad:   11.7 mmHg MV Decel Time: 296 msec    TR Vmax:        171.00 cm/s MV E velocity: 39.10 cm/s MV A velocity: 62.60 cm/s  SHUNTS MV E/A ratio:  0.62        Systemic VTI:  0.14 m                            Systemic Diam: 1.90 cm Lyman Bishop MD Electronically signed by Lyman Bishop MD Signature Date/Time: 05/10/2019/10:50:51 AM    Final         Scheduled Meds: . aspirin  81 mg Oral Daily  . atorvastatin  80 mg Oral Q supper  . buPROPion  300 mg Oral Daily  . citalopram  40 mg Oral QHS  . dexamethasone (DECADRON) injection  6 mg Intravenous Q24H  . enoxaparin (LOVENOX) injection  110 mg Subcutaneous Q12H  . pantoprazole  40 mg Oral Daily   Continuous Infusions:   LOS: 2 days    Time spent: 25 minutes    Barb Merino, MD [facility] Pager [phone]

## 2019-05-10 NOTE — Progress Notes (Signed)
Patient complained of anxiety and PRN vistaril PO administered.  Other PRN anxiety meds are not due currently.  Patient's wife called and was concerned that patient may leave AMA again because patient is complaining that the walls are "coming in on him."  Explained to wife that I had given him PRN meds for the anxiety but they had not had time to work yet.

## 2019-05-10 NOTE — Progress Notes (Signed)
Lower venous duplex       has been completed. Preliminary results can be found under CV proc through chart review. Courtney Fenlon, BS, RDMS, RVT   

## 2019-05-10 NOTE — Telephone Encounter (Signed)
Left message for Matthew Eaton to return my call.  Matthew Eaton is requesting call at hospital from Dr. Lorelei Pont.  Will send back to Dr. Lorelei Pont to see if he can call and speak with Matthew Eaton.

## 2019-05-10 NOTE — Evaluation (Signed)
Physical Therapy Evaluation Patient Details Name: Matthew Eaton MRN: [id number] DOB: Jun 01, 1960 Today's Date: 05/10/2019   History of Present Illness  59 year old male admitted 05/28/2019, who signed out AMA 7pm-9:30PM on the evening of admission, to the hosptial for shortness of breath with exertion x 2 days. He was diagnosed with acute hypoxic respiratory failure due to COVID-19 pneumonia. D-dimer rising. Patient on therapeutic Lovenox since 05/09/19. CT angiogram negative 05/05/19. PMH:  hyperlipidemia, chronic pain syndrome, former smoker, GERD, depression     Clinical Impression  Patient presents with decreased balance, impulsivity, decreased safety awareness, impaired activity tolerance, and desats on 15L HFNC just with transfers. Recommend continued skilled PT services and pending mobility progress while in the hospital, SNF vs home PT with assist. PT evaluation limited due to patient's decreased tolerance for further assessment.    Follow Up Recommendations Home health PT;SNF;Supervision/Assistance - 24 hour((pending mobility progression while in the hospital))    Equipment Recommendations  Other (comment)(TBD upon further assessment)       Precautions / Restrictions Precautions Precautions: Fall;Other (comment) Precaution Comments: monitor oxygen saturation Restrictions Weight Bearing Restrictions: No      Mobility  Bed Mobility  General bed mobility comments: Patient already OOB in recliner chair.  Transfers Overall transfer level: Needs assistance Equipment used: None Transfers: Sit to/from Stand Sit to Stand: Min assist         General transfer comment: Patient impulsively performing sit<>stand x 3 trials despite education from PT to stop due to chair not locked and blanket at his feet.  Ambulation/Gait   General Gait Details: Gait deferred by patient noting he walked earlier and by PT as patient desatting just with standing on 15L HFNC.    Balance Overall  balance assessment: Needs assistance       Postural control: Posterior lean Standing balance support: No upper extremity supported Standing balance-Leahy Scale: Poor      Pertinent Vitals/Pain Pain Assessment: 0-10 Pain Score: 7  Pain Location: R shoulder, L side patient reports due to fall last night Pain Descriptors / Indicators: Aching;Sharp Pain Intervention(s): Limited activity within patient's tolerance;Monitored during session    Home Living Family/patient expects to be discharged to:: Private residence Living Arrangements: Spouse/significant other;Other (Comment)(wife and brother-in-law) Available Help at Discharge: Family Type of Home: Mobile home Home Access: Stairs to enter Entrance Stairs-Rails: Can reach both Entrance Stairs-Number of Steps: 4(at front and back entrance) Home Layout: One level Home Equipment: Wheelchair - manual;Shower seat;Cane - quad;Cane - single point;Walker - 4 wheels Additional Comments: brother-in-law handicap, dtr lives nearby, brother-in-law goes to dtr's during the day    Prior Function Level of Independence: Independent         Comments: assist for washing hair, wife assists with washing his hair, wife does cooking and cleaning, reports trips are due to dogs at his feet, 8 dogs         Extremity/Trunk Assessment        Lower Extremity Assessment Lower Extremity Assessment: RLE deficits/detail;LLE deficits/detail RLE Deficits / Details: grossly 4/5 LLE Deficits / Details: grossly 4/5       Communication   Communication: No difficulties  Cognition Arousal/Alertness: Awake/alert Behavior During Therapy: Impulsive Overall Cognitive Status: Within Functional Limits for tasks assessed      General Comments General comments (skin integrity, edema, etc.): Patient on 15L HFNC. At rest, O2 sat 92%, HR 82 bpm, RR 31. O2 saturation down to 83-86% with transfers, HR 71 bpm, RR 19. O2 saturation mid  to high 80s at end of session as  patient sitting upright in chair without back supported and with feet on ground. Education provided that patient would recover quicker in recliner position but patient declined. Nurse aware and noting that those are his oxygen saturation goals.          Assessment/Plan    PT Assessment Patient needs continued PT services  PT Problem List Decreased strength;Decreased activity tolerance;Decreased balance;Decreased mobility;Decreased safety awareness;Decreased knowledge of use of DME;Cardiopulmonary status limiting activity;Pain       PT Treatment Interventions DME instruction;Gait training;Stair training;Functional mobility training;Therapeutic activities;Therapeutic exercise;Balance training;Patient/family education    PT Goals (Current goals can be found in the Care Plan section)  Acute Rehab PT Goals Time For Goal Achievement: 05/23/19 Potential to Achieve Goals: Good    Frequency Min 3X/week           AM-PAC PT "6 Clicks" Mobility  Outcome Measure Help needed turning from your back to your side while in a flat bed without using bedrails?: None Help needed moving from lying on your back to sitting on the side of a flat bed without using bedrails?: A Little Help needed moving to and from a bed to a chair (including a wheelchair)?: A Little Help needed standing up from a chair using your arms (e.g., wheelchair or bedside chair)?: A Little Help needed to walk in hospital room?: A Little Help needed climbing 3-5 steps with a railing? : A Lot 6 Click Score: 18    End of Session Equipment Utilized During Treatment: Oxygen Activity Tolerance: Patient limited by fatigue Patient left: in chair;with call bell/phone within reach;with chair alarm set Nurse Communication: Mobility status;Other (comment)(oxygen saturation response) PT Visit Diagnosis: Unsteadiness on feet (R26.81);Other abnormalities of gait and mobility (R26.89)    Time: [id number] PT Time Calculation (min) (ACUTE  ONLY): 17 min   Charges:   PT Evaluation $PT Eval Moderate Complexity: 1 Mod         Birdie Hopes, PT, DPT Acute Rehab [phone] office   Birdie Hopes 05/10/2019, 1:27 PM

## 2019-05-11 ENCOUNTER — Inpatient Hospital Stay (HOSPITAL_COMMUNITY): Payer: PPO

## 2019-05-11 DIAGNOSIS — U071 COVID-19: Principal | ICD-10-CM

## 2019-05-11 DIAGNOSIS — J9601 Acute respiratory failure with hypoxia: Secondary | ICD-10-CM

## 2019-05-11 LAB — POCT I-STAT 7, (LYTES, BLD GAS, ICA,H+H)
Acid-base deficit: 1 mmol/L (ref 0.0–2.0)
Acid-base deficit: 4 mmol/L — ABNORMAL HIGH (ref 0.0–2.0)
Bicarbonate: 23.8 mmol/L (ref 20.0–28.0)
Bicarbonate: 23.8 mmol/L (ref 20.0–28.0)
Calcium, Ion: 1.18 mmol/L (ref 1.15–1.40)
Calcium, Ion: 1.2 mmol/L (ref 1.15–1.40)
HCT: 43 % (ref 39.0–52.0)
HCT: 41 % (ref 39.0–52.0)
Hemoglobin: 14.6 g/dL (ref 13.0–17.0)
Hemoglobin: 13.9 g/dL (ref 13.0–17.0)
O2 Saturation: 95 %
O2 Saturation: 89 %
Patient temperature: 98.5
Patient temperature: 97.5
Potassium: 3.7 mmol/L (ref 3.5–5.1)
Potassium: 3.7 mmol/L (ref 3.5–5.1)
Sodium: 136 mmol/L (ref 135–145)
Sodium: 138 mmol/L (ref 135–145)
TCO2: 25 mmol/L (ref 22–32)
TCO2: 25 mmol/L (ref 22–32)
pCO2 arterial: 39.2 mmHg (ref 32.0–48.0)
pCO2 arterial: 50.6 mmHg — ABNORMAL HIGH (ref 32.0–48.0)
pH, Arterial: 7.391 (ref 7.350–7.450)
pH, Arterial: 7.277 — ABNORMAL LOW (ref 7.350–7.450)
pO2, Arterial: 74 mmHg — ABNORMAL LOW (ref 83.0–108.0)
pO2, Arterial: 62 mmHg — ABNORMAL LOW (ref 83.0–108.0)

## 2019-05-11 LAB — CBC
HCT: 45.7 % (ref 39.0–52.0)
Hemoglobin: 15 g/dL (ref 13.0–17.0)
MCH: 30.7 pg (ref 26.0–34.0)
MCHC: 32.8 g/dL (ref 30.0–36.0)
MCV: 93.5 fL (ref 80.0–100.0)
Platelets: 406 10*3/uL — ABNORMAL HIGH (ref 150–400)
RBC: 4.89 MIL/uL (ref 4.22–5.81)
RDW: 13.9 % (ref 11.5–15.5)
WBC: 9.2 10*3/uL (ref 4.0–10.5)
nRBC: 0.2 % (ref 0.0–0.2)

## 2019-05-11 LAB — TRIGLYCERIDES: Triglycerides: 780 mg/dL — ABNORMAL HIGH (ref <150)

## 2019-05-11 LAB — BASIC METABOLIC PANEL
Anion gap: 15 (ref 5–15)
BUN: 33 mg/dL — ABNORMAL HIGH (ref 6–20)
CO2: 22 mmol/L (ref 22–32)
Calcium: 8.9 mg/dL (ref 8.9–10.3)
Chloride: 101 mmol/L (ref 98–111)
Creatinine, Ser: 1.89 mg/dL — ABNORMAL HIGH (ref 0.61–1.24)
GFR calc Af Amer: 44 mL/min — ABNORMAL LOW (ref >60)
GFR calc non Af Amer: 38 mL/min — ABNORMAL LOW (ref >60)
Glucose, Bld: 158 mg/dL — ABNORMAL HIGH (ref 70–99)
Potassium: 3.7 mmol/L (ref 3.5–5.1)
Sodium: 138 mmol/L (ref 135–145)

## 2019-05-11 LAB — C-REACTIVE PROTEIN: CRP: 1.6 mg/dL — ABNORMAL HIGH (ref <1.0)

## 2019-05-11 LAB — GLUCOSE, CAPILLARY
Glucose-Capillary: 169 mg/dL — ABNORMAL HIGH (ref 70–99)
Glucose-Capillary: 141 mg/dL — ABNORMAL HIGH (ref 70–99)
Glucose-Capillary: 118 mg/dL — ABNORMAL HIGH (ref 70–99)
Glucose-Capillary: 177 mg/dL — ABNORMAL HIGH (ref 70–99)
Glucose-Capillary: 177 mg/dL — ABNORMAL HIGH (ref 70–99)
Glucose-Capillary: 155 mg/dL — ABNORMAL HIGH (ref 70–99)

## 2019-05-11 LAB — BLOOD GAS, ARTERIAL
Acid-base deficit: 1 mmol/L (ref 0.0–2.0)
Bicarbonate: 22.1 mmol/L (ref 20.0–28.0)
FIO2: 100
O2 Saturation: 87 %
Patient temperature: 36.6
pCO2 arterial: 29.4 mmHg — ABNORMAL LOW (ref 32.0–48.0)
pH, Arterial: 7.487 — ABNORMAL HIGH (ref 7.350–7.450)
pO2, Arterial: 55.5 mmHg — ABNORMAL LOW (ref 83.0–108.0)

## 2019-05-11 LAB — D-DIMER, QUANTITATIVE: D-Dimer, Quant: 3.85 ug/mL-FEU — ABNORMAL HIGH (ref 0.00–0.50)

## 2019-05-11 LAB — LACTIC ACID, PLASMA
Lactic Acid, Venous: 3.1 mmol/L (ref 0.5–1.9)
Lactic Acid, Venous: 2.4 mmol/L (ref 0.5–1.9)

## 2019-05-11 LAB — PHOSPHORUS
Phosphorus: 6.7 mg/dL — ABNORMAL HIGH (ref 2.5–4.6)
Phosphorus: 6.7 mg/dL — ABNORMAL HIGH (ref 2.5–4.6)

## 2019-05-11 LAB — MAGNESIUM
Magnesium: 2.5 mg/dL — ABNORMAL HIGH (ref 1.7–2.4)
Magnesium: 2.3 mg/dL (ref 1.7–2.4)

## 2019-05-11 LAB — BRAIN NATRIURETIC PEPTIDE: B Natriuretic Peptide: 15.2 pg/mL (ref 0.0–100.0)

## 2019-05-11 MED ORDER — VECURONIUM BROMIDE 10 MG IV SOLR
10.0000 mg | Freq: Once | INTRAVENOUS | Status: AC
  Administered 2019-05-11: 10 mg via INTRAVENOUS

## 2019-05-11 MED ORDER — PROPOFOL 1000 MG/100ML IV EMUL
0.0000 ug/kg/min | INTRAVENOUS | Status: DC
  Administered 2019-05-11: 40 ug/kg/min via INTRAVENOUS
  Filled 2019-05-11: qty 100

## 2019-05-11 MED ORDER — ATORVASTATIN CALCIUM 40 MG PO TABS
80.0000 mg | ORAL_TABLET | Freq: Every day | ORAL | Status: DC
  Administered 2019-05-11 – 2019-05-25 (×15): 80 mg
  Filled 2019-05-11 (×2): qty 1
  Filled 2019-05-11: qty 2
  Filled 2019-05-11 (×2): qty 1
  Filled 2019-05-11: qty 2
  Filled 2019-05-11 (×3): qty 1
  Filled 2019-05-11: qty 2
  Filled 2019-05-11 (×5): qty 1

## 2019-05-11 MED ORDER — NOREPINEPHRINE 4 MG/250ML-% IV SOLN
0.0000 ug/min | INTRAVENOUS | Status: DC
  Administered 2019-05-11: 2 ug/min via INTRAVENOUS

## 2019-05-11 MED ORDER — STERILE WATER FOR INJECTION IJ SOLN
INTRAMUSCULAR | Status: AC
  Filled 2019-05-11: qty 10

## 2019-05-11 MED ORDER — FREE WATER
250.0000 mL | Status: DC
  Administered 2019-05-11 – 2019-05-18 (×38): 250 mL

## 2019-05-11 MED ORDER — MIDAZOLAM HCL 2 MG/2ML IJ SOLN: 2.0000 mg | Freq: Once | INTRAMUSCULAR | Status: DC

## 2019-05-11 MED ORDER — DOCUSATE SODIUM 50 MG/5ML PO LIQD
100.0000 mg | Freq: Two times a day (BID) | ORAL | Status: DC
  Administered 2019-05-11 – 2019-06-05 (×40): 100 mg
  Filled 2019-05-11 (×42): qty 10

## 2019-05-11 MED ORDER — SODIUM CHLORIDE 0.9 % IV SOLN: INTRAVENOUS | Status: DC | PRN

## 2019-05-11 MED ORDER — POLYETHYLENE GLYCOL 3350 17 G PO PACK: 17.0000 g | PACK | Freq: Every day | ORAL | Status: DC | PRN

## 2019-05-11 MED ORDER — CHLORHEXIDINE GLUCONATE 0.12% ORAL RINSE (MEDLINE KIT)
15.0000 mL | Freq: Two times a day (BID) | OROMUCOSAL | Status: DC
  Administered 2019-05-11 – 2019-06-06 (×52): 15 mL via OROMUCOSAL

## 2019-05-11 MED ORDER — VECURONIUM BROMIDE 10 MG IV SOLR
INTRAVENOUS | Status: AC
  Filled 2019-05-11: qty 10

## 2019-05-11 MED ORDER — CITALOPRAM HYDROBROMIDE 40 MG PO TABS
40.0000 mg | ORAL_TABLET | Freq: Every day | ORAL | Status: DC
  Administered 2019-05-11 – 2019-05-25 (×15): 40 mg
  Filled 2019-05-11 (×16): qty 1

## 2019-05-11 MED ORDER — VECURONIUM BROMIDE 10 MG IV SOLR
0.0000 ug/kg/min | INTRAVENOUS | Status: DC
  Administered 2019-05-11 – 2019-05-12 (×2): 1 ug/kg/min via INTRAVENOUS
  Filled 2019-05-11 (×4): qty 100

## 2019-05-11 MED ORDER — DOCUSATE SODIUM 50 MG/5ML PO LIQD: 100.0000 mg | Freq: Two times a day (BID) | ORAL | Status: DC

## 2019-05-11 MED ORDER — INSULIN ASPART 100 UNIT/ML ~~LOC~~ SOLN
0.0000 [IU] | SUBCUTANEOUS | Status: DC
  Administered 2019-05-11 – 2019-05-12 (×3): 3 [IU] via SUBCUTANEOUS
  Administered 2019-05-12 (×4): 2 [IU] via SUBCUTANEOUS
  Administered 2019-05-12 – 2019-05-13 (×2): 3 [IU] via SUBCUTANEOUS
  Administered 2019-05-13: 2 [IU] via SUBCUTANEOUS
  Administered 2019-05-13: 5 [IU] via SUBCUTANEOUS
  Administered 2019-05-13 (×2): 3 [IU] via SUBCUTANEOUS
  Administered 2019-05-13 – 2019-05-14 (×2): 2 [IU] via SUBCUTANEOUS
  Administered 2019-05-14: 0 [IU] via SUBCUTANEOUS
  Administered 2019-05-14 – 2019-05-15 (×8): 2 [IU] via SUBCUTANEOUS
  Administered 2019-05-16: 3 [IU] via SUBCUTANEOUS
  Administered 2019-05-16 (×3): 2 [IU] via SUBCUTANEOUS
  Administered 2019-05-17 (×2): 3 [IU] via SUBCUTANEOUS
  Administered 2019-05-17: 2 [IU] via SUBCUTANEOUS
  Administered 2019-05-17 – 2019-05-19 (×11): 3 [IU] via SUBCUTANEOUS
  Administered 2019-05-19: 2 [IU] via SUBCUTANEOUS
  Administered 2019-05-19: 3 [IU] via SUBCUTANEOUS
  Administered 2019-05-19 (×2): 2 [IU] via SUBCUTANEOUS
  Administered 2019-05-20: 3 [IU] via SUBCUTANEOUS
  Administered 2019-05-20 (×4): 2 [IU] via SUBCUTANEOUS
  Administered 2019-05-21: 3 [IU] via SUBCUTANEOUS
  Administered 2019-05-21: 2 [IU] via SUBCUTANEOUS
  Administered 2019-05-21: 5 [IU] via SUBCUTANEOUS
  Administered 2019-05-21 – 2019-05-23 (×14): 3 [IU] via SUBCUTANEOUS
  Administered 2019-05-23 – 2019-05-24 (×2): 5 [IU] via SUBCUTANEOUS
  Administered 2019-05-24 (×3): 3 [IU] via SUBCUTANEOUS
  Administered 2019-05-24: 2 [IU] via SUBCUTANEOUS
  Administered 2019-05-24: 3 [IU] via SUBCUTANEOUS
  Administered 2019-05-25: 5 [IU] via SUBCUTANEOUS
  Administered 2019-05-25 (×5): 3 [IU] via SUBCUTANEOUS
  Administered 2019-05-26: 5 [IU] via SUBCUTANEOUS
  Administered 2019-05-26: 3 [IU] via SUBCUTANEOUS
  Administered 2019-05-26 – 2019-05-27 (×6): 5 [IU] via SUBCUTANEOUS
  Administered 2019-05-27: 2 [IU] via SUBCUTANEOUS
  Administered 2019-05-27 – 2019-05-28 (×4): 3 [IU] via SUBCUTANEOUS
  Administered 2019-05-28: 2 [IU] via SUBCUTANEOUS
  Administered 2019-05-28: 5 [IU] via SUBCUTANEOUS
  Administered 2019-05-28 – 2019-05-29 (×3): 3 [IU] via SUBCUTANEOUS
  Administered 2019-05-29 (×3): 5 [IU] via SUBCUTANEOUS
  Administered 2019-05-29 – 2019-05-30 (×4): 3 [IU] via SUBCUTANEOUS
  Administered 2019-05-30 (×2): 5 [IU] via SUBCUTANEOUS
  Administered 2019-05-30 (×2): 3 [IU] via SUBCUTANEOUS
  Administered 2019-05-31: 2 [IU] via SUBCUTANEOUS
  Administered 2019-05-31 (×4): 3 [IU] via SUBCUTANEOUS
  Administered 2019-06-01 (×5): 2 [IU] via SUBCUTANEOUS
  Administered 2019-06-02: 3 [IU] via SUBCUTANEOUS
  Administered 2019-06-02: 2 [IU] via SUBCUTANEOUS
  Administered 2019-06-02 – 2019-06-03 (×5): 3 [IU] via SUBCUTANEOUS
  Administered 2019-06-03 – 2019-06-05 (×11): 2 [IU] via SUBCUTANEOUS
  Administered 2019-06-05 – 2019-06-06 (×2): 3 [IU] via SUBCUTANEOUS
  Administered 2019-06-06: 2 [IU] via SUBCUTANEOUS

## 2019-05-11 MED ORDER — FENTANYL CITRATE (PF) 100 MCG/2ML IJ SOLN
INTRAMUSCULAR | Status: AC
  Filled 2019-05-11: qty 2

## 2019-05-11 MED ORDER — ACETAMINOPHEN 325 MG PO TABS
650.0000 mg | ORAL_TABLET | Freq: Four times a day (QID) | ORAL | Status: DC | PRN
  Administered 2019-05-16 – 2019-06-02 (×17): 650 mg
  Filled 2019-05-11 (×17): qty 2

## 2019-05-11 MED ORDER — FENTANYL 2500MCG IN NS 250ML (10MCG/ML) PREMIX INFUSION
50.0000 ug/h | INTRAVENOUS | Status: DC
  Administered 2019-05-11 – 2019-05-12 (×3): 200 ug/h via INTRAVENOUS
  Administered 2019-05-12: 175 ug/h via INTRAVENOUS
  Administered 2019-05-13: 300 ug/h via INTRAVENOUS
  Administered 2019-05-13: 200 ug/h via INTRAVENOUS
  Administered 2019-05-14 (×2): 300 ug/h via INTRAVENOUS
  Administered 2019-05-15: 250 ug/h via INTRAVENOUS
  Administered 2019-05-16 – 2019-05-17 (×2): 50 ug/h via INTRAVENOUS
  Filled 2019-05-11 (×11): qty 250

## 2019-05-11 MED ORDER — ETOMIDATE 2 MG/ML IV SOLN: 20.0000 mg | Freq: Once | INTRAVENOUS | Status: AC

## 2019-05-11 MED ORDER — FENTANYL CITRATE (PF) 100 MCG/2ML IJ SOLN: 50.0000 ug | Freq: Once | INTRAMUSCULAR | Status: AC

## 2019-05-11 MED ORDER — ARTIFICIAL TEARS OPHTHALMIC OINT
1.0000 "application " | TOPICAL_OINTMENT | Freq: Three times a day (TID) | OPHTHALMIC | Status: DC
  Administered 2019-05-11 – 2019-05-16 (×10): 1 via OPHTHALMIC
  Filled 2019-05-11 (×2): qty 3.5

## 2019-05-11 MED ORDER — ONDANSETRON HCL 4 MG/2ML IJ SOLN: 4.0000 mg | Freq: Four times a day (QID) | INTRAMUSCULAR | Status: DC | PRN

## 2019-05-11 MED ORDER — SODIUM CHLORIDE 0.9 % IV BOLUS: 1000.0000 mL | Freq: Once | INTRAVENOUS | Status: DC

## 2019-05-11 MED ORDER — POLYETHYLENE GLYCOL 3350 17 G PO PACK: 17.0000 g | PACK | Freq: Every day | ORAL | Status: DC

## 2019-05-11 MED ORDER — PROPOFOL 1000 MG/100ML IV EMUL
INTRAVENOUS | Status: AC
  Administered 2019-05-11: 04:00:00 20 ug/kg/min via INTRAVENOUS
  Filled 2019-05-11: qty 100

## 2019-05-11 MED ORDER — MIDAZOLAM HCL 2 MG/2ML IJ SOLN
INTRAMUSCULAR | Status: AC
  Filled 2019-05-11: qty 4

## 2019-05-11 MED ORDER — PRO-STAT SUGAR FREE PO LIQD
60.0000 mL | Freq: Four times a day (QID) | ORAL | Status: DC
  Administered 2019-05-11 – 2019-05-18 (×27): 60 mL
  Filled 2019-05-11 (×20): qty 60

## 2019-05-11 MED ORDER — SODIUM CHLORIDE 0.9 % IV SOLN
2.0000 g | Freq: Two times a day (BID) | INTRAVENOUS | Status: DC
  Administered 2019-05-11 – 2019-05-12 (×3): 2 g via INTRAVENOUS
  Filled 2019-05-11 (×3): qty 2

## 2019-05-11 MED ORDER — BUPROPION HCL 100 MG PO TABS
100.0000 mg | ORAL_TABLET | Freq: Three times a day (TID) | ORAL | Status: DC
  Administered 2019-05-11 – 2019-06-06 (×78): 100 mg
  Filled 2019-05-11 (×85): qty 1

## 2019-05-11 MED ORDER — MIDAZOLAM 50MG/50ML (1MG/ML) PREMIX INFUSION: 0.0000 mg/h | INTRAVENOUS | Status: DC

## 2019-05-11 MED ORDER — MIDAZOLAM HCL 2 MG/2ML IJ SOLN
INTRAMUSCULAR | Status: AC
  Administered 2019-05-11: 2 mg
  Filled 2019-05-11: qty 2

## 2019-05-11 MED ORDER — ETOMIDATE 2 MG/ML IV SOLN
INTRAVENOUS | Status: AC
  Administered 2019-05-11: 20 mg via INTRAVENOUS
  Filled 2019-05-11: qty 20

## 2019-05-11 MED ORDER — SUCCINYLCHOLINE CHLORIDE 200 MG/10ML IV SOSY
PREFILLED_SYRINGE | INTRAVENOUS | Status: AC
  Administered 2019-05-11: 100 mg via INTRAVENOUS
  Filled 2019-05-11: qty 10

## 2019-05-11 MED ORDER — SODIUM CHLORIDE 0.9 % IV SOLN
2.0000 g | Freq: Three times a day (TID) | INTRAVENOUS | Status: DC
  Administered 2019-05-11: 2 g via INTRAVENOUS
  Filled 2019-05-11: qty 2

## 2019-05-11 MED ORDER — FENTANYL BOLUS VIA INFUSION
50.0000 ug | INTRAVENOUS | Status: DC | PRN
  Filled 2019-05-11: qty 50

## 2019-05-11 MED ORDER — POLYETHYLENE GLYCOL 3350 17 G PO PACK
17.0000 g | PACK | Freq: Every day | ORAL | Status: DC
  Administered 2019-05-11 – 2019-06-04 (×20): 17 g
  Filled 2019-05-11 (×21): qty 1

## 2019-05-11 MED ORDER — NOREPINEPHRINE 4 MG/250ML-% IV SOLN
INTRAVENOUS | Status: AC
  Filled 2019-05-11: qty 250

## 2019-05-11 MED ORDER — FENTANYL CITRATE (PF) 100 MCG/2ML IJ SOLN: 100.0000 ug | Freq: Once | INTRAMUSCULAR | Status: DC

## 2019-05-11 MED ORDER — MIDAZOLAM BOLUS VIA INFUSION
1.0000 mg | INTRAVENOUS | Status: DC | PRN
  Administered 2019-05-11 – 2019-05-17 (×5): 2 mg via INTRAVENOUS
  Filled 2019-05-11: qty 2

## 2019-05-11 MED ORDER — SODIUM CHLORIDE 0.9 % IV SOLN
2.0000 g | Freq: Once | INTRAVENOUS | Status: AC
  Administered 2019-05-11: 2 g via INTRAVENOUS
  Filled 2019-05-11: qty 2

## 2019-05-11 MED ORDER — FENTANYL CITRATE (PF) 100 MCG/2ML IJ SOLN
50.0000 ug | Freq: Once | INTRAMUSCULAR | Status: AC
  Administered 2019-05-11: 50 ug via INTRAVENOUS
  Filled 2019-05-11: qty 2

## 2019-05-11 MED ORDER — VECURONIUM BOLUS VIA INFUSION
0.0800 mg/kg | Freq: Once | INTRAVENOUS | Status: AC
  Administered 2019-05-11: 9.1 mg via INTRAVENOUS
  Filled 2019-05-11: qty 10

## 2019-05-11 MED ORDER — SUCCINYLCHOLINE CHLORIDE 20 MG/ML IJ SOLN: 100.0000 mg | Freq: Once | INTRAMUSCULAR | Status: AC

## 2019-05-11 MED ORDER — FENTANYL CITRATE (PF) 100 MCG/2ML IJ SOLN: 200.0000 ug | Freq: Once | INTRAMUSCULAR | Status: AC

## 2019-05-11 MED ORDER — ORAL CARE MOUTH RINSE
15.0000 mL | OROMUCOSAL | Status: DC
  Administered 2019-05-11 – 2019-06-06 (×255): 15 mL via OROMUCOSAL

## 2019-05-11 MED ORDER — PANTOPRAZOLE SODIUM 40 MG IV SOLR
40.0000 mg | Freq: Every day | INTRAVENOUS | Status: DC
  Administered 2019-05-11 – 2019-05-16 (×6): 40 mg via INTRAVENOUS
  Filled 2019-05-11 (×6): qty 40

## 2019-05-11 MED ORDER — CHLORHEXIDINE GLUCONATE CLOTH 2 % EX PADS
6.0000 | MEDICATED_PAD | Freq: Every day | CUTANEOUS | Status: DC
  Administered 2019-05-12 – 2019-06-06 (×24): 6 via TOPICAL

## 2019-05-11 MED ORDER — FENTANYL 2500MCG IN NS 250ML (10MCG/ML) PREMIX INFUSION
50.0000 ug/h | INTRAVENOUS | Status: DC
  Administered 2019-05-11: 100 ug/h via INTRAVENOUS
  Filled 2019-05-11: qty 250

## 2019-05-11 MED ORDER — FENTANYL BOLUS VIA INFUSION
50.0000 ug | INTRAVENOUS | Status: DC | PRN
  Administered 2019-05-13 – 2019-05-14 (×6): 50 ug via INTRAVENOUS
  Filled 2019-05-11: qty 50

## 2019-05-11 MED ORDER — VITAL 1.5 CAL PO LIQD
1000.0000 mL | ORAL | Status: DC
  Administered 2019-05-12 – 2019-05-18 (×7): 1000 mL
  Filled 2019-05-11 (×8): qty 1000

## 2019-05-11 MED ORDER — FENTANYL CITRATE (PF) 100 MCG/2ML IJ SOLN
INTRAMUSCULAR | Status: AC
  Administered 2019-05-11: 04:00:00 200 ug via INTRAVENOUS
  Filled 2019-05-11: qty 2

## 2019-05-11 MED ORDER — MIDAZOLAM BOLUS VIA INFUSION
1.0000 mg | INTRAVENOUS | Status: DC | PRN
  Filled 2019-05-11: qty 2

## 2019-05-11 MED ORDER — MIDAZOLAM 50MG/50ML (1MG/ML) PREMIX INFUSION
2.0000 mg/h | INTRAVENOUS | Status: DC
  Administered 2019-05-11: 6 mg/h via INTRAVENOUS
  Administered 2019-05-11: 2 mg/h via INTRAVENOUS
  Administered 2019-05-12 (×2): 6 mg/h via INTRAVENOUS
  Administered 2019-05-13: 2 mg/h via INTRAVENOUS
  Administered 2019-05-13 (×2): 10 mg/h via INTRAVENOUS
  Administered 2019-05-14: 8 mg/h via INTRAVENOUS
  Administered 2019-05-14 (×3): 10 mg/h via INTRAVENOUS
  Administered 2019-05-14: 8 mg/h via INTRAVENOUS
  Administered 2019-05-15 – 2019-05-16 (×2): 2 mg/h via INTRAVENOUS
  Filled 2019-05-11 (×14): qty 50

## 2019-05-11 MED ORDER — ASPIRIN 81 MG PO CHEW
81.0000 mg | CHEWABLE_TABLET | Freq: Every day | ORAL | Status: DC
  Administered 2019-05-11 – 2019-06-05 (×26): 81 mg
  Filled 2019-05-11 (×26): qty 1

## 2019-05-11 MED ORDER — DOCUSATE SODIUM 100 MG PO CAPS: 100.0000 mg | ORAL_CAPSULE | Freq: Two times a day (BID) | ORAL | Status: DC | PRN

## 2019-05-11 MED ORDER — ENOXAPARIN SODIUM 60 MG/0.6ML ~~LOC~~ SOLN
60.0000 mg | Freq: Two times a day (BID) | SUBCUTANEOUS | Status: DC
  Administered 2019-05-11: 60 mg via SUBCUTANEOUS
  Filled 2019-05-11 (×2): qty 0.6

## 2019-05-11 NOTE — Procedures (Signed)
Endotracheal Intubation Procedure Note  Indication for endotracheal intubation: respiratory failure. Airway Assessment: Mallampati Class: II (hard and soft palate, upper portion of tonsils anduvula visible). Sedation: etomidate and fentanyl. Paralytic: succinylcholine. Lidocaine: no. Atropine: no. Equipment: Macintosh 3 laryngoscope blade.  Glidescope Cricoid Pressure: no. Number of attempts: 1. ETT location confirmed by by auscultation and ETCO2 monitor.  Shellia Cleverly 05/11/2019

## 2019-05-11 NOTE — Progress Notes (Signed)
Pharmacy Antibiotic Note  Matthew Eaton is a 59 y.o. male admitted on 05/10/2019 with Covid, now w/ worsening pneumonia on CXR.  Pharmacy has been consulted for cefepime dosing.  Plan: Cefepime 2g IV Q8H.  Height: 5\' 10"  (177.8 cm) Weight: 113.4 kg (250 lb) IBW/kg (Calculated) : 73  Temp (24hrs), Avg:98.1 F (36.7 C), Min:97.8 F (36.6 C), Max:98.5 F (36.9 C)  Recent Labs  Lab 05/23/2019 0323 05/16/2019 2226 05/09/19 0226 05/09/19 0710 05/10/19 0442  WBC 11.4* 14.4* 12.8* 13.1* 11.5*  CREATININE 1.30* 1.37* 1.25* 1.25* 1.23    Estimated Creatinine Clearance: 82.6 mL/min (by C-G formula based on SCr of 1.23 mg/dL).    Allergies  Allergen Reactions  . Gabapentin Other (See Comments)    Severe tingling in the legs and weakness (of the legs)  . Methocarbamol Anaphylaxis, Shortness Of Breath and Other (See Comments)    Respiratory arrest- Turned purple    Thank you for allowing pharmacy to be a part of this patient's care.  Wynona Neat, PharmD, BCPS  05/11/2019 4:26 AM

## 2019-05-11 NOTE — Progress Notes (Signed)
RT and RN turned pt head.

## 2019-05-11 NOTE — Progress Notes (Signed)
PHARMACY NOTE:  ANTIMICROBIAL RENAL DOSAGE ADJUSTMENT  Current antimicrobial regimen includes a mismatch between antimicrobial dosage and estimated renal function.  As per policy approved by the Pharmacy & Therapeutics and Medical Executive Committees, the antimicrobial dosage will be adjusted accordingly.  Current antimicrobial dosage:  Cefepime 2 gms IV Q8hr    Renal Function:  Estimated Creatinine Clearance: 53.8 mL/min (A) (by C-G formula based on SCr of 1.89 mg/dL (H)).    Antimicrobial dosage has been changed to:  Cefepime 2 gms IV q12hr  Thank you for allowing pharmacy to be a part of this patient's care.  Corinda Gubler, [facility] 05/11/2019 12:43 PM

## 2019-05-11 NOTE — Progress Notes (Signed)
[place] Progress Note Patient Name: Matthew Eaton DOB: February 09, 1960 MRN: [id number]   Date of Service  05/11/2019  HPI/Events of Note  Hypoxia - Sat = 85%  eICU Interventions  Will increase PEEP to 12.     Intervention Category Major Interventions: Hypoxemia - evaluation and management  Katonya Blecher [place] 05/11/2019, 5:21 AM

## 2019-05-11 NOTE — Consult Note (Signed)
NAME:  Matthew Eaton, MRN:  [id number], DOB:  08-Nov-1960, LOS: 3 ADMISSION DATE:  05/18/2019, CONSULTATION DATE: 05/11/2019 REFERRING MD: [facility] hospitalist, CHIEF COMPLAINT: Hypoxemia  Brief History   59 year old with a 9-day history of slowly worsening Covid pneumonia despite appropriate therapy  History of present illness   Patient is a 59 year old male initially admitted 3?31 to 05/09/2019 for Covid pneumonia.  He signed out AMA the evening of 05/23/2019.  Then returned the evening of 4/5 complaining of worsening shortness of breath.  Presenting saturations at that time were in the 80s and he came up into the mid 90s on 100% oxygen.  We were asked to see early this morning in regards to worsening hypoxemia.  The patient was noncompliant with his oxygen.  On high flow nasal cannula at 100% the patient had an oxygen saturation of about 79%.  With the addition of 100% facemask the patient had oxygen saturation in the mid 80s.  He was verbal throughout and did not look particularly dyspneic.  Patient was urgently transferred to the ICU and intubated.  At the time of this dictation he is ventilated on 100% with an oxygen saturation between 85 and 95%. He completed his Decadron remdesivir and Actemra.  Chest x-ray performed earlier this morning shows worsening infiltrate particularly throughout the right lung.  Past Medical History   . Chronic neck pain 05/20/2012  . Family hx of ALS (amyotrophic lateral sclerosis) 05/27/2013  . GERD (gastroesophageal reflux disease)   . Hiatal hernia   . HLD (hyperlipidemia)   . Major depressive disorder, recurrent episode, moderate ([place]) 05/27/2013  . Neck pain    cervical spine with herniation  . Tobacco abuse   . Wrist fracture, left      Significant Hospital Events   Transferred to ICU intubation 05/11/2019  Consults:  [facility] hospitalist, PCCM  Procedures:  Intubation 05/11/2019  Significant Diagnostic Tests:  Covid PCR  Micro Data:  As  above  Antimicrobials:  We will start broad-spectrum antibiotics for worsening pneumonia  Interim history/subjective:  NA  Objective   Blood pressure 99/70, pulse (!) 101, temperature 98 F (36.7 C), temperature source Oral, resp. rate (!) 24, height 5\' 10"  (1.778 m), weight 113.4 kg, SpO2 (!) 86 %.    FiO2 (%):  [80 %-100 %] 100 %   Intake/Output Summary (Last 24 hours) at 05/11/2019 0325 Last data filed at 05/10/2019 1835 Gross per 24 hour  Intake 120 ml  Output 600 ml  Net -480 ml   Filed Weights   05/07/2019 2131  Weight: 113.4 kg    Examination: General: White male appears older than stated age HENT: Within normal limits Lungs: Coarse diminished breath sounds bilaterally Cardiovascular: Tachycardic Abdomen: Benign bowel sounds positive Extremities: Within normal limits Neuro: Nonfocal GU: N/A  Resolved Hospital Problem list   NA  Assessment & Plan:  1.  Covid pneumonia with worsening hypoxemia.  Patient has been treated for 9 days in-house with a brief period of discharge AMA.  He has been hypoxemic throughout with a significant deterioration in his respiratory status tonight.  This seems most likely to represent a late ARDS characteristic of Covid pneumonia however we will cover him with empiric antibiotics for possible concomitant bacterial pneumonia.   Best practice:  Diet: N.p.o. Pain/Anxiety/Delirium protocol (if indicated): Propofol VAP protocol (if indicated): Yes DVT prophylaxis: Lovenox GI prophylaxis: Pepcid Glucose control: Monitor with routine BMP Mobility: Bedrest Code Status: Full Family Communication: We will attempt to contact Disposition: To 58M  ICU-Covid unit-for intubation/critical care  Labs   CBC: Recent Labs  Lab 05/07/19 0611 05/07/19 0611 05/05/2019 0323 05/05/2019 2226 05/09/19 0226 05/09/19 0710 05/10/19 0442  WBC 11.8*   < > 11.4* 14.4* 12.8* 13.1* 11.5*  NEUTROABS 9.0*  --  8.5* 11.9*  --  11.1* 10.0*  HGB 12.8*   < > 13.5 14.8  12.9* 13.1 13.2  HCT 40.0   < > 41.7 45.9 39.6 40.4 41.3  MCV 93.0   < > 93.3 94.6 92.3 93.3 93.2  PLT 378   < > 437* 464* 387 383 389   < > = values in this interval not displayed.    Basic Metabolic Panel: Recent Labs  Lab 05/04/19 0424 05/04/19 0424 05/05/19 [id number] 05/05/19 [id number] 05/06/19 0244 05/06/19 0244 05/07/19 [id number] 05/07/19 [id number] 05/06/2019 0323 05/19/2019 2226 05/09/19 0226 05/09/19 0710 05/10/19 0442  NA 141   < > 142   < > 143   < > 141  --  143 137  --  140 137  K 3.8   < > 3.8   < > 4.4   < > 4.2  --  4.4 4.8  --  4.2 4.0  CL 104   < > 105   < > 108   < > 107  --  106 104  --  105 103  CO2 23   < > 23   < > 25   < > 25  --  27 21*  --  23 20*  GLUCOSE 132*   < > 128*   < > 134*   < > 144*  --  110* 114*  --  128* 124*  BUN 16   < > 24*   < > 29*   < > 26*  --  27* 30*  --  29* 26*  CREATININE 1.09   < > 1.17   < > 1.18   < > 1.21   < > 1.30* 1.37* 1.25* 1.25* 1.23  CALCIUM 8.6*   < > 9.0   < > 9.0   < > 8.8*  --  9.1 8.8*  --  8.7* 8.4*  MG 2.3   < > 2.4  --  2.4  --  2.5*  --  2.6*  --   --   --  2.6*  PHOS 3.4  --   --   --   --   --   --   --   --   --   --   --   --    < > = values in this interval not displayed.   GFR: Estimated Creatinine Clearance: 82.6 mL/min (by C-G formula based on SCr of 1.23 mg/dL). Recent Labs  Lab 05/06/19 0244 05/06/19 0244 05/07/19 [id number] 05/07/19 [id number] 05/07/2019 0323 05/05/2019 0323 05/04/2019 2226 05/09/19 0226 05/09/19 0710 05/10/19 0442  PROCALCITON <0.10  --  <0.10  --  <0.10  --   --   --  <0.10  --   WBC 9.5   < > 11.8*   < > 11.4*   < > 14.4* 12.8* 13.1* 11.5*   < > = values in this interval not displayed.    Liver Function Tests: Recent Labs  Lab 05/07/19 0611 05/28/2019 0323 05/05/2019 2226 05/09/19 0710 05/10/19 0442  AST 85* 97* 123* 75* 84*  ALT 96* 115* 172* 133* 141*  ALKPHOS 77 76 97 75 80  BILITOT 0.4 0.7 1.2 0.8 0.8  PROT 5.8* 6.0*  6.2* 5.4* 5.4*  ALBUMIN 2.9* 3.0* 3.2* 2.9* 2.9*   No results for  input(s): LIPASE, AMYLASE in the last 168 hours. No results for input(s): AMMONIA in the last 168 hours.  ABG    Component Value Date/Time   PHART 7.487 (H) 05/11/2019 0242   PCO2ART 29.4 (L) 05/11/2019 0242   PO2ART 55.5 (L) 05/11/2019 0242   HCO3 22.1 05/11/2019 0242   ACIDBASEDEF 1.0 05/11/2019 0242   O2SAT 87.0 05/11/2019 0242     Coagulation Profile: No results for input(s): INR, PROTIME in the last 168 hours.  Cardiac Enzymes: No results for input(s): CKTOTAL, CKMB, CKMBINDEX, TROPONINI in the last 168 hours.  HbA1C: Hemoglobin A1C  Date/Time Value Ref Range Status  01/02/2019 09:13 AM 6.1 (A) 4.0 - 5.6 % Final   Hgb A1c MFr Bld  Date/Time Value Ref Range Status  07/01/2018 09:47 AM 6.2 (H) <5.7 % of total Hgb Final    Comment:    For someone without known diabetes, a hemoglobin  A1c value between 5.7% and 6.4% is consistent with prediabetes and should be confirmed with a  follow-up test. . For someone with known diabetes, a value <7% indicates that their diabetes is well controlled. A1c targets should be individualized based on duration of diabetes, age, comorbid conditions, and other considerations. . This assay result is consistent with an increased risk of diabetes. . Currently, no consensus exists regarding use of hemoglobin A1c for diagnosis of diabetes for children. Marland Kitchen   06/11/2017 04:19 PM 6.3 (H) <5.7 % of total Hgb Final    Comment:    For someone without known diabetes, a hemoglobin  A1c value between 5.7% and 6.4% is consistent with prediabetes and should be confirmed with a  follow-up test. . For someone with known diabetes, a value <7% indicates that their diabetes is well controlled. A1c targets should be individualized based on duration of diabetes, age, comorbid conditions, and other considerations. . This assay result is consistent with an increased risk of diabetes. . Currently, no consensus exists regarding use of hemoglobin A1c  for diagnosis of diabetes for children. .     CBG: No results for input(s): GLUCAP in the last 168 hours.  Review of Systems:   Patient has no specific complaints other than difficulty breathing.  Otherwise review of systems seems unremarkable although we could not obtain it due to the patient's disagreeable nature  Past Medical History  He,  has a past medical history of Chronic neck pain (05/20/2012), Family hx of ALS (amyotrophic lateral sclerosis) (05/27/2013), GERD (gastroesophageal reflux disease), Hiatal hernia, HLD (hyperlipidemia), Major depressive disorder, recurrent episode, moderate ([place]) (05/27/2013), Neck pain, Tobacco abuse, and Wrist fracture, left.   Surgical History    Past Surgical History:  Procedure Laterality Date  . NECK SURGERY     x2- 2010, 2011     Social History   reports that he quit smoking about 12 years ago. He quit after 30.00 years of use. He has never used smokeless tobacco. He reports that he does not drink alcohol or use drugs.   Family History   His family history includes ALS in his brother, maternal aunt, maternal uncle, and mother. There is no history of Colon cancer, Esophageal cancer, Rectal cancer, or Stomach cancer.   Allergies Allergies  Allergen Reactions  . Gabapentin Other (See Comments)    Severe tingling in the legs and weakness (of the legs)  . Methocarbamol Anaphylaxis, Shortness Of Breath and Other (See Comments)  Respiratory arrest- Turned purple     Home Medications  Prior to Admission medications   Medication Sig Start Date End Date Taking? Authorizing Provider  aspirin 81 MG tablet Take 81 mg by mouth daily.      [provider]  atorvastatin (LIPITOR) 80 MG tablet TAKE 1 TABLET BY MOUTH DAILY AT 6 PM. Patient taking differently: Take 80 mg by mouth daily with supper.  09/12/18   Copland, Frederico Hamman, MD  buPROPion (WELLBUTRIN XL) 300 MG 24 hr tablet TAKE 1 TABLET BY MOUTH DAILY. Patient taking differently: Take  300 mg by mouth daily.  01/24/19   Copland, Frederico Hamman, MD  Cholecalciferol (VITAMIN D3) 50 MCG (2000 UT) TABS Take 2,000 Units by mouth daily with breakfast.    [provider]  citalopram (CELEXA) 40 MG tablet TAKE 1 TABLET BY MOUTH DAILY. Patient taking differently: Take 40 mg by mouth at bedtime.  01/09/19   Copland, Frederico Hamman, MD  clonazePAM (KLONOPIN) 0.5 MG tablet TAKE 1 TABLET BY MOUTH TWICE A DAY AS NEEDED Patient taking differently: Take 0.5 mg by mouth in the morning and at bedtime.  11/28/18   Copland, Frederico Hamman, MD  HYDROcodone-acetaminophen (NORCO) 10-325 MG tablet TAKE 1 TABLET BY MOUTH EVERY 6 HOURS AS NEEDED FOR SEVERE PAIN. Patient not taking: Reported on 04/10/2019 04/03/19   Copland, Frederico Hamman, MD  HYDROcodone-acetaminophen ([place]) 10-325 MG tablet TAKE 1 TABLET BY MOUTH EVERY 6 HOURS AS NEEDED FOR SEVERE PAIN Patient taking differently: Take 0.5-1 tablets by mouth every 6 (six) hours as needed for severe pain.  04/03/19   Copland, Frederico Hamman, MD  HYDROcodone-acetaminophen (NORCO) 10-325 MG tablet Take 1 tablet by mouth every 6 (six) hours as needed for severe pain. Patient not taking: Reported on 04/06/2019 04/03/19   Owens Loffler, MD  ibuprofen (ADVIL) 200 MG tablet Take 400 mg by mouth every 6 (six) hours as needed (for headaches).    [provider]  Melatonin 10 MG TABS Take 10-20 mg by mouth at bedtime as needed (for sleep).    [provider]  nortriptyline (PAMELOR) 25 MG capsule Take 1 capsule (25 mg total) by mouth at bedtime. Patient not taking: Reported on 04/18/2019 05/04/18   Copland, Frederico Hamman, MD  omeprazole (PRILOSEC) 20 MG capsule TAKE 1 CAPSULE BY MOUTH 2 TIMES DAILY BEFORE A MEAL. Patient taking differently: Take 20 mg by mouth 2 (two) times daily before a meal.  09/16/18   Copland, Frederico Hamman, MD     Critical care time: 35 minutes of time spent in chart review bedside evaluation and critical care planning.

## 2019-05-11 NOTE — Progress Notes (Addendum)
Report called to 3rd floor ICU.  Wife called and updated on transfer and change in status.  Belongings sent with patient

## 2019-05-11 NOTE — Progress Notes (Signed)
Initial Nutrition Assessment  DOCUMENTATION CODES:   Obesity unspecified  INTERVENTION:  Monitor magnesium, potassium, and phosphorus daily for at least 3 days, MD to replete as needed, as pt is at risk for refeeding syndrome.  Initiate: -Vital 1.5 cal @ 27ml/hr, increase 31ml/hr Q4H until goal rate of 28ml/hr (951ml) is reached -70ml Pro-stat QID -285ml free water flushes Q4H  At goal, tube feeding regimen provides 2240 kcal, 185 grams protein, 718ml free water (2264ml free water total with flushes)  NUTRITION DIAGNOSIS:   Increased nutrient needs related to catabolic illness(Covid) as evidenced by estimated needs.   GOAL:   Patient will meet greater than or equal to 90% of their needs   MONITOR:   Vent status  REASON FOR ASSESSMENT:   Ventilator    ASSESSMENT:   Pt initially admitted for acute hypoxic respiratory failure due to COVID-19 PNA 3/31-4/5 and signed out AMA. Pt returned later on 4/5 C/O worsening hypoxemia and was re-admitted. PMH includes GERD, hiatal hernia, HLD, depression, and chronic pain.  4/8 pt intubated  PIV unsuccessful x4, CVC to be placed per RN.   Pt proning.  PO intake: 25% x 1 recorded meal prior to intubation  UOP: 657ml x24 hours I/O: -561ml since admit  Patient is currently intubated on ventilator support MV: 10.2 L/min Temp (24hrs), Avg:97.9 F (36.6 C), Min:97.5 F (36.4 C), Max:98.5 F (36.9 C)  Medications reviewed and include: Decadron, Colace, Miralax Drips: Levophed, Fentanyl  Labs reviewed: Phosphorus 6.7 (H), Mg 2.5 (H), CBGs 141-169   Diet Order:   Diet Order            Diet NPO time specified  Diet effective now              EDUCATION NEEDS:   Not appropriate for education at this time  Skin:  Skin Assessment: Reviewed RN Assessment  Last BM:  4/5  Height:   Ht Readings from Last 1 Encounters:  05/10/2019 5\' 10"  (1.778 m)    Weight:   Wt Readings from Last 1 Encounters:  05/15/2019 113.4 kg     Ideal Body Weight:  75.45 kg  BMI:  Body mass index is 35.87 kg/m.  Estimated Nutritional Needs:   Kcal:  [id number]  Protein:  150-188 grams  Fluid:  >2L    Larkin Ina, MS, RD, LDN RD pager number and weekend/on-call pager number located in [place].

## 2019-05-11 NOTE — Progress Notes (Signed)
MD to room,  PIV unsuccessful x4  BP 68/50(56) - Propofol turned off  IV 0.9% NS 1 liter bolus given  CVC to be placed RT aware of saturations 84%

## 2019-05-11 NOTE — Progress Notes (Signed)
RT and RN's turned pt's head.

## 2019-05-11 NOTE — Progress Notes (Addendum)
Patient O2 sats were dropping.  NRB mask applied at 15L but patient's O2 sats continued to stay in the high 70s, low 80s.  No splitter on unit.  Respiratory therapist contacted but said they do not supply nor have the splitters.  Agricultural consultant notified.  Rapid Response Nurse notified.  RT to floor.  Patient placed on HFNC via portable O2 tank at 15L and 15L NRB mask on wall O2 source until splitter could be located.    Patient tolerated the 15L NRB and 15L HFNC for a while but did desat after a while.  MD paged and order for heated high flow obtained.  Patient currently on [place] and NRB but is not maintaining O2 goal.  Rapid Response RN is donning PPE currently to look at the patient and assess him.  Patient is stating that he is unable to tolerate the mask.  74 - Paged Opyd, MD again to let him know that patient is currently requiring 35L/100% HHFNC and 15L NRB with O2 sats around 84%.  Also told him that patient is stating he is going to take it all off.

## 2019-05-11 NOTE — Progress Notes (Signed)
PROGRESS NOTE    Matthew Eaton  [id number] DOB: [birth year] DOA: 06/01/2019 PCP: Owens Loffler, MD   Brief Narrative:  Matthew Eaton a 59 y.o.malewith medical history significant ofhyperlipidemia, chronic pain syndrome, anxiety, former smoker, GERD, depression presented to emergency department due to shortness of breath with exertion since 2 days, was diagnosed with acute hypoxic respiratory failure due to COVID-19 pneumonia and admitted to the hospital. Patient was in the hospital, was still hypoxic , signed out AMA , left and came back to ER with severe shortness of breath.   Acute Event:  Patient has had progressive increase in supplemental oxygen requirement throughout the shift, saturating in the low 90s on 15 L/min via nasal cannula with RR low 20s at the start of shift, now saturating in the mid 80s on heated high flow at 35 L/min in addition to nonrebreather mask.  Respirations remain in the mid 20s and the patient does not appear to be in acute distress, mentating appropriately, but stating that he is unable to tolerate the nonrebreather and requiring constant reassurance to keep it in place.  He denies any chest pain, denies any lower extremity swelling or tenderness, but complains that he feels that he cannot breathe.  Procalcitonin was low and PE was ruled out with CTA chest on 05/09/2019.   Portable chest x-ray was just now obtained and appears to demonstrate worsening in his extensive bilateral infiltrates, left greater than right.  ABG has been ordered.  Assessment & Plan:   1. Acute hypoxic respiratory failure  - Patient has pneumonia d/t COVID, initially admitted with hypoxic respiratory failure on 3/31, left AMA on 4/5, and returned/was readmitted the following day  - Tonight he has had marked increase in supplemental O2 requirement and is anxious, threatening to remove his oxygen  - Procalcitonin has been low and CTA chest negative for PE; CXR  (radiology read pending) with increased extensive b/l opacities L>R  - Do not want to oversedate Matthew Eaton but he is removing NRB due to anxiety and will be given Xanax as was already ordered  - Continue heated high-flow [place] and NRB as tolerated, check ABG, consult with PCCM    Subjective: "I can't breath with this mask on." Denies chest pain. Denies leg swelling or tenderness.   Objective: Vitals:   05/11/19 0003 05/11/19 0059 05/11/19 0115 05/11/19 0135  BP:      Pulse: 92 99 92 (!) 101  Resp: 18 14 (!) 28 (!) 24  Temp:      TempSrc:      SpO2: (!) 87% (!) 85% (!) 82% (!) 86%  Weight:      Height:        Intake/Output Summary (Last 24 hours) at 05/11/2019 0227 Last data filed at 05/10/2019 1835 Gross per 24 hour  Intake 120 ml  Output 600 ml  Net -480 ml   Filed Weights   05/26/2019 2131  Weight: 113.4 kg    Examination:  General exam: Tachypneic, restless, no diaphoresis   Respiratory system: RR in mid 53s. No pallor or cyanosis. Cardiovascular system: S1 & S2 heard, RRR. No JVD, murmurs, rubs, gallops or clicks. No pedal edema. Gastrointestinal system: Abdomen is nondistended, soft and nontender. No organomegaly or masses felt. Normal bowel sounds heard. Central nervous system:  Moving all extremities. No facial asymmetry.  Extremities: Symmetric 5 x 5 power. Skin: No rashes, lesions or ulcers Psychiatry: Anxious. Alert and oriented.    Data Reviewed: I have personally reviewed  following labs and imaging studies  CBC: Recent Labs  Lab 05/07/19 0611 05/07/19 0611 05/31/2019 0323 05/22/2019 2226 05/09/19 0226 05/09/19 0710 05/10/19 0442  WBC 11.8*   < > 11.4* 14.4* 12.8* 13.1* 11.5*  NEUTROABS 9.0*  --  8.5* 11.9*  --  11.1* 10.0*  HGB 12.8*   < > 13.5 14.8 12.9* 13.1 13.2  HCT 40.0   < > 41.7 45.9 39.6 40.4 41.3  MCV 93.0   < > 93.3 94.6 92.3 93.3 93.2  PLT 378   < > 437* 464* 387 383 389   < > = values in this interval not displayed.   Basic Metabolic  Panel: Recent Labs  Lab 05/04/19 0424 05/04/19 0424 05/05/19 [id number] 05/05/19 [id number] 05/06/19 0244 05/06/19 0244 05/07/19 [id number] 05/07/19 [id number] 05/04/2019 0323 05/13/2019 2226 05/09/19 0226 05/09/19 0710 05/10/19 0442  NA 141   < > 142   < > 143   < > 141  --  143 137  --  140 137  K 3.8   < > 3.8   < > 4.4   < > 4.2  --  4.4 4.8  --  4.2 4.0  CL 104   < > 105   < > 108   < > 107  --  106 104  --  105 103  CO2 23   < > 23   < > 25   < > 25  --  27 21*  --  23 20*  GLUCOSE 132*   < > 128*   < > 134*   < > 144*  --  110* 114*  --  128* 124*  BUN 16   < > 24*   < > 29*   < > 26*  --  27* 30*  --  29* 26*  CREATININE 1.09   < > 1.17   < > 1.18   < > 1.21   < > 1.30* 1.37* 1.25* 1.25* 1.23  CALCIUM 8.6*   < > 9.0   < > 9.0   < > 8.8*  --  9.1 8.8*  --  8.7* 8.4*  MG 2.3   < > 2.4  --  2.4  --  2.5*  --  2.6*  --   --   --  2.6*  PHOS 3.4  --   --   --   --   --   --   --   --   --   --   --   --    < > = values in this interval not displayed.   GFR: Estimated Creatinine Clearance: 82.6 mL/min (by C-G formula based on SCr of 1.23 mg/dL). Liver Function Tests: Recent Labs  Lab 05/07/19 0611 05/07/2019 0323 06/01/2019 2226 05/09/19 0710 05/10/19 0442  AST 85* 97* 123* 75* 84*  ALT 96* 115* 172* 133* 141*  ALKPHOS 77 76 97 75 80  BILITOT 0.4 0.7 1.2 0.8 0.8  PROT 5.8* 6.0* 6.2* 5.4* 5.4*  ALBUMIN 2.9* 3.0* 3.2* 2.9* 2.9*   No results for input(s): LIPASE, AMYLASE in the last 168 hours. No results for input(s): AMMONIA in the last 168 hours. Coagulation Profile: No results for input(s): INR, PROTIME in the last 168 hours. Cardiac Enzymes: No results for input(s): CKTOTAL, CKMB, CKMBINDEX, TROPONINI in the last 168 hours. BNP (last 3 results) No results for input(s): PROBNP in the last 8760 hours. HbA1C: No results for input(s): HGBA1C in the last 72 hours.  CBG: No results for input(s): GLUCAP in the last 168 hours. Lipid Profile: No results for input(s): CHOL, HDL, LDLCALC, TRIG,  CHOLHDL, LDLDIRECT in the last 72 hours. Thyroid Function Tests: No results for input(s): TSH, T4TOTAL, FREET4, T3FREE, THYROIDAB in the last 72 hours. Anemia Panel: Recent Labs    05/23/2019 2226 05/09/19 0710  FERRITIN 363* 242   Sepsis Labs: Recent Labs  Lab 05/06/19 0244 05/07/19 0611 06/01/2019 0323 05/09/19 0710  PROCALCITON <0.10 <0.10 <0.10 <0.10    Recent Results (from the past 240 hour(s))  Blood Culture (routine x 2)     Status: None   Collection Time: 04/30/2019  4:35 PM   Specimen: BLOOD  Result Value Ref Range Status   Specimen Description BLOOD RIGHT ANTECUBITAL  Final   Special Requests   Final    BOTTLES DRAWN AEROBIC AND ANAEROBIC Blood Culture adequate volume   Culture   Final    NO GROWTH 5 DAYS Performed at [facility], [place] [address] [place], [place] [address]    Report Status 05/15/2019 FINAL  Final  Blood Culture (routine x 2)     Status: None   Collection Time: 04/07/2019  4:51 PM   Specimen: BLOOD  Result Value Ref Range Status   Specimen Description BLOOD LEFT ANTECUBITAL  Final   Special Requests   Final    BOTTLES DRAWN AEROBIC AND ANAEROBIC Blood Culture adequate volume   Culture   Final    NO GROWTH 5 DAYS Performed at [facility], [place] [address], [place], [place] [address]    Report Status 05/09/2019 FINAL  Final         Radiology Studies: CT ANGIO CHEST PE W OR WO CONTRAST  Result Date: 05/09/2019 CLINICAL DATA:  Shortness of breath.  COVID-19 positive EXAM: CT ANGIOGRAPHY CHEST WITH CONTRAST TECHNIQUE: Multidetector CT imaging of the chest was performed using the standard protocol during bolus administration of intravenous contrast. Multiplanar CT image reconstructions and MIPs were obtained to evaluate the vascular anatomy. CONTRAST:  70 mL OMNIPAQUE IOHEXOL 350 MG/ML SOLN COMPARISON:  May 05, 2019 CT angiogram chest; chest radiograph May 08, 2019 FINDINGS: Cardiovascular: There is no demonstrable pulmonary embolus.  There is no thoracic aortic aneurysm or dissection. There are scattered foci in visualized great vessels. There are scattered foci of aortic atherosclerosis. There is no pericardial effusion or pericardial thickening. Mediastinum/Nodes: Thyroid appears unremarkable. There is no evident thoracic adenopathy. There is a focal moderate hiatal hernia, stable. Lungs/Pleura: There is underlying centrilobular emphysematous change. There is widespread ground-glass type opacity throughout the lungs fairly diffusely with overall increased airspace opacity in the right middle lobe compared to recent CT with similar changes elsewhere. Note that there is extensive consolidation throughout most of the left lung, stable. No frank consolidation evident. No pleural effusions are appreciable. Upper Abdomen: Visualized upper abdominal structures appear unremarkable. Musculoskeletal: Postoperative changes noted in the lower cervical spine. There are no blastic or lytic bone lesions. There are no evident chest wall lesions. Review of the MIP images confirms the above findings. IMPRESSION: 1. No demonstrable pulmonary embolus. No thoracic aortic aneurysm. There are foci of aortic atherosclerosis. 2. Widespread ground-glass type opacity consistent with extensive multifocal pneumonia, slightly increased in the right middle lobe and stable albeit extensive elsewhere. Overall there is more infiltrate on the left than on the right, stable. Underlying degree of emphysema. 3.  No evident adenopathy. 4.  Stable moderate hiatal hernia. 5.  No evident adenopathy. Aortic Atherosclerosis (  ICD10-I70.0) and Emphysema (ICD10-J43.9). Electronically Signed   By: Lowella Grip III M.D.   On: 05/09/2019 14:13   VAS [place] LOWER EXTREMITY VENOUS (DVT)  Result Date: 05/10/2019  Lower Venous DVTStudy Indications: Swelling.  Comparison Study: no prior Performing Technologist: June Leap RDMS, RVT  Examination Guidelines: A complete evaluation includes  B-mode imaging, spectral Doppler, color Doppler, and power Doppler as needed of all accessible portions of each vessel. Bilateral testing is considered an integral part of a complete examination. Limited examinations for reoccurring indications may be performed as noted. The reflux portion of the exam is performed with the patient in reverse Trendelenburg.  +---------+---------------+---------+-----------+----------+--------------+ RIGHT    CompressibilityPhasicitySpontaneityPropertiesThrombus Aging +---------+---------------+---------+-----------+----------+--------------+ CFV      Full           Yes      Yes                                 +---------+---------------+---------+-----------+----------+--------------+ SFJ      Full                                                        +---------+---------------+---------+-----------+----------+--------------+ FV Prox  Full                                                        +---------+---------------+---------+-----------+----------+--------------+ FV Mid   Full                                                        +---------+---------------+---------+-----------+----------+--------------+ FV DistalFull                                                        +---------+---------------+---------+-----------+----------+--------------+ PFV      Full                                                        +---------+---------------+---------+-----------+----------+--------------+ POP      Full           Yes      Yes                                 +---------+---------------+---------+-----------+----------+--------------+ PTV      Full                                                        +---------+---------------+---------+-----------+----------+--------------+  PERO     Full                                                         +---------+---------------+---------+-----------+----------+--------------+   +---------+---------------+---------+-----------+----------+--------------+ LEFT     CompressibilityPhasicitySpontaneityPropertiesThrombus Aging +---------+---------------+---------+-----------+----------+--------------+ CFV      Full           Yes      Yes                                 +---------+---------------+---------+-----------+----------+--------------+ SFJ      Full                                                        +---------+---------------+---------+-----------+----------+--------------+ FV Prox  Full                                                        +---------+---------------+---------+-----------+----------+--------------+ FV Mid   Full                                                        +---------+---------------+---------+-----------+----------+--------------+ FV DistalFull                                                        +---------+---------------+---------+-----------+----------+--------------+ PFV      Full                                                        +---------+---------------+---------+-----------+----------+--------------+ POP      Full           Yes      Yes                                 +---------+---------------+---------+-----------+----------+--------------+ PTV      Full                                                        +---------+---------------+---------+-----------+----------+--------------+ PERO     Full                                                        +---------+---------------+---------+-----------+----------+--------------+  Summary: RIGHT: - There is no evidence of deep vein thrombosis in the lower extremity.  - No cystic structure found in the popliteal fossa.  LEFT: - There is no evidence of deep vein thrombosis in the lower extremity.  - No cystic structure found in the popliteal fossa.   *See table(s) above for measurements and observations. Electronically signed by Servando Snare MD on 05/10/2019 at 5:10:16 PM.    Final    ECHOCARDIOGRAM LIMITED  Result Date: 05/10/2019    ECHOCARDIOGRAM REPORT   Patient Name:   [facility] DAVID Higinbotham Date of Exam: 05/10/2019 Medical Rec #:  [id number]           Height:       70.0 in Accession #:    [id number]          Weight:       250.0 lb Date of Birth:  Aug 25, 1960           BSA:          2.295 m Patient Age:    64 years            BP:           114/62 mmHg Patient Gender: M                   HR:           72 bpm. Exam Location:  Inpatient Procedure: 2D Echo, Cardiac Doppler and Color Doppler Indications:    CHF-Acute Diastolic 0000000  History:        Patient has no prior history of Echocardiogram examinations.                 Risk Factors:Dyslipidemia, Diabetes and Former Smoker. Acute                 renal failure. Acute hypoxemic respiratory failure due to                 COVID-19. GERD.  Sonographer:    Clayton Lefort RDCS (AE) Referring Phys: [id number] [facility]  Sonographer Comments: Technically challenging study due to limited acoustic windows. COVID-19. IMPRESSIONS  1. Left ventricular ejection fraction, by estimation, is 60 to 65%. The left ventricle has normal function. The left ventricle has no regional wall motion abnormalities. There is mild left ventricular hypertrophy. Left ventricular diastolic parameters are consistent with Grade I diastolic dysfunction (impaired relaxation).  2. Right ventricular systolic function is low normal. The right ventricular size is normal.  3. The mitral valve is grossly normal. Trivial mitral valve regurgitation.  4. The aortic valve is tricuspid. Aortic valve regurgitation is not visualized. Mild aortic valve sclerosis is present, with no evidence of aortic valve stenosis. FINDINGS  Left Ventricle: Left ventricular ejection fraction, by estimation, is 60 to 65%. The left ventricle has normal function. The left  ventricle has no regional wall motion abnormalities. The left ventricular internal cavity size was normal in size. There is  mild left ventricular hypertrophy. Left ventricular diastolic parameters are consistent with Grade I diastolic dysfunction (impaired relaxation). Normal left ventricular filling pressure. Right Ventricle: The right ventricular size is normal. No increase in right ventricular wall thickness. Right ventricular systolic function is low normal. Left Atrium: Left atrial size was normal in size. Right Atrium: Right atrial size was normal in size. Pericardium: There is no evidence of pericardial effusion. Mitral Valve: The mitral valve is grossly normal. Trivial mitral valve regurgitation. Tricuspid Valve: The tricuspid valve is  grossly normal. Tricuspid valve regurgitation is trivial. Aortic Valve: The aortic valve is tricuspid. Aortic valve regurgitation is not visualized. Mild aortic valve sclerosis is present, with no evidence of aortic valve stenosis. Aortic valve mean gradient measures 4.0 mmHg. Pulmonic Valve: The pulmonic valve was normal in structure. Pulmonic valve regurgitation is not visualized. Aorta: The aortic root and ascending aorta are structurally normal, with no evidence of dilitation. Venous: The inferior vena cava was not well visualized. IAS/Shunts: No atrial level shunt detected by color flow Doppler.  LEFT VENTRICLE PLAX 2D LVIDd:         4.10 cm  Diastology LVIDs:         2.70 cm  LV e' lateral:   9.90 cm/s LV PW:         1.20 cm  LV E/e' lateral: 3.9 LV IVS:        1.00 cm  LV e' medial:    6.53 cm/s LVOT diam:     1.90 cm  LV E/e' medial:  6.0 LV SV:         39 LV SV Index:   17 LVOT Area:     2.84 cm  LEFT ATRIUM         Index LA diam:    2.20 cm 0.96 cm/m  AORTIC VALVE AV Area (Vmax):    1.77 cm AV Area (Vmean):   1.68 cm AV Area (VTI):     1.66 cm AV Vmax:           145.00 cm/s AV Vmean:          87.900 cm/s AV VTI:            0.234 m AV Peak Grad:      8.4 mmHg AV  Mean Grad:      4.0 mmHg LVOT Vmax:         90.70 cm/s LVOT Vmean:        52.200 cm/s LVOT VTI:          0.137 m LVOT/AV VTI ratio: 0.59  AORTA Ao Root diam: 3.30 cm MITRAL VALVE               TRICUSPID VALVE MV Area (PHT): 2.56 cm    TR Peak grad:   11.7 mmHg MV Decel Time: 296 msec    TR Vmax:        171.00 cm/s MV E velocity: 39.10 cm/s MV A velocity: 62.60 cm/s  SHUNTS MV E/A ratio:  0.62        Systemic VTI:  0.14 m                            Systemic Diam: 1.90 cm Lyman Bishop MD Electronically signed by Lyman Bishop MD Signature Date/Time: 05/10/2019/10:50:51 AM    Final         Scheduled Meds: . aspirin  81 mg Oral Daily  . atorvastatin  80 mg Oral Q supper  . buPROPion  300 mg Oral Daily  . citalopram  40 mg Oral QHS  . dexamethasone (DECADRON) injection  6 mg Intravenous Q24H  . enoxaparin (LOVENOX) injection  110 mg Subcutaneous Q12H  . pantoprazole  40 mg Oral Daily   Continuous Infusions:   LOS: 3 days    Time spent: 48 minutes.     Vianne Bulls, MD [facility]   To contact the attending provider between 7A-7P or the covering provider during after  hours 7P-7A, please log into the web site [email or web address] and access using universal [place] password for that web site. If you do not have the password, please call the hospital operator.  05/11/2019, 2:27 AM

## 2019-05-11 NOTE — Progress Notes (Signed)
RT note: Rt and RN turned patient head and alternated arm position Q2 hours through out day.

## 2019-05-11 NOTE — Progress Notes (Addendum)
NAME:  Matthew Eaton, MRN:  [id number], DOB:  [birth year], LOS: 3 ADMISSION DATE:  05/20/2019, CONSULTATION DATE:  05/11/2019 REFERRING MD:  [facility], CHIEF COMPLAINT:  hypoxemia   Brief History    59 year old with a 9-day history of slowly worsening Covid pneumonia despite appropriate therapy  History of present illness    Patient is a 59 year old male initially admitted 3?31 to 05/07/2019 for Covid pneumonia.  He signed out AMA the evening of 05/04/2019.  Then returned the evening of 4/5 complaining of worsening shortness of breath.  Presenting saturations at that time were in the 80s and he came up into the mid 90s on 100% oxygen.  We were asked to see early this morning in regards to worsening hypoxemia.  The patient was noncompliant with his oxygen.  On high flow nasal cannula at 100% the patient had an oxygen saturation of about 79%.  With the addition of 100% facemask the patient had oxygen saturation in the mid 80s.  He was verbal throughout and did not look particularly dyspneic.  Patient was urgently transferred to the ICU and intubated.  At the time of this dictation he is ventilated on 100% with an oxygen saturation between 85 and 95%. He completed his Decadron remdesivir and Actemra.  Chest x-ray performed earlier this morning shows worsening infiltrate particularly throughout the right lung.  Past Medical History   Past Medical History:  Diagnosis Date  . Chronic neck pain 05/20/2012  . Family hx of ALS (amyotrophic lateral sclerosis) 05/27/2013  . GERD (gastroesophageal reflux disease)   . Hiatal hernia   . HLD (hyperlipidemia)   . Major depressive disorder, recurrent episode, moderate ([place]) 05/27/2013  . Neck pain    cervical spine with herniation  . Tobacco abuse   . Wrist fracture, left    Significant Hospital Events    Transferred to ICU intubation 05/11/2019 Consults:  Tried hospitalist, PCCM  Procedures:  Intubation 05/11/2019 Central line placement  05/11/2019  Significant Diagnostic Tests:  Covid PCR  Micro Data:  Respiratory cultures-pending  Antimicrobials:  Cefepime 4/7>> Remdesivir-completed  Interim history/subjective:  Remains hypoxemic Hypotensive  Objective   Blood pressure 108/75, pulse 82, temperature (!) 97.5 F (36.4 C), temperature source Axillary, resp. rate 11, height 5\' 10"  (1.778 m), weight 113.4 kg, SpO2 (!) 82 %.    Vent Mode: PRVC FiO2 (%):  [80 %-100 %] 100 % Set Rate:  [16 bmp] 16 bmp Vt Set:  [600 mL] 600 mL PEEP:  [10 [id number] cmH20] 12 cmH20 Plateau Pressure:  [17 cmH20-18 cmH20] 17 cmH20    Intake/Output Summary (Last 24 hours) at 05/11/2019 0920 Last data filed at 05/10/2019 1835 Gross per 24 hour  Intake --  Output 600 ml  Net -600 ml   Filed Weights   05/29/2019 2131  Weight: 113.4 kg   Examination: General: Middle-aged, chronically ill-appearing HENT: Moist oral mucosa Lungs: Decreased air movement bilaterally Cardiovascular: S1-S2 appreciated Abdomen: Bowel sounds appreciated Extremities: No clubbing, no edema Neuro: Sedated GU:   Resolved Hospital Problem list     Assessment & Plan:  Covid pneumonia with worsening hypoxemia ARDS  Continue mechanical ventilation per ARDS protocol Target TVol 6-8cc/kgIBW Target Plateau Pressure < 30cm H20 Target driving pressure less than 15 cm of water Target PaO2 55-65: titrate PEEP/FiO2 per protocol As long as PaO2 to FiO2 ratio is less than 1:150 position in prone position for 16 hours a day Check CVP daily if CVL in place Target CVP less than 4, diurese  as necessary Ventilator associated pneumonia prevention protocol  Acute hypoxemic respiratory failure -Secondary to recent Covid pneumonia -Did complete remdesivir and Decadron -Signed out AMA  Possible pneumonia -Start on antibiotics -On cefepime -Obtain tracheal cultures  Diabetes -SSI  GERD -PPI  History of major depression History of chronic pain History of chronic  opioid use for chronic back pain Diabetes     Best practice:  Diet: N.p.o. Pain/Anxiety/Delirium protocol (if indicated): Fentanyl, Versed VAP protocol (if indicated): In place DVT prophylaxis: Lovenox GI prophylaxis: Protonix Glucose control: SSI Mobility: Bedrest Code Status: Full code Family Communication: Updated spouse Kahri Archie Disposition: ICU  Labs   CBC: Recent Labs  Lab 05/07/19 [phone] 05/07/19 [id number] 05/28/2019 0323 05/19/2019 0323 05/14/2019 2226 05/26/2019 2226 05/09/19 0226 05/09/19 0710 05/10/19 0442 05/11/19 0423 05/11/19 0504  WBC 11.8*   < > 11.4*   < > 14.4*  --  12.8* 13.1* 11.5*  --  9.2  NEUTROABS 9.0*  --  8.5*  --  11.9*  --   --  11.1* 10.0*  --   --   HGB 12.8*   < > 13.5   < > 14.8   < > 12.9* 13.1 13.2 14.6 15.0  HCT 40.0   < > 41.7   < > 45.9   < > 39.6 40.4 41.3 43.0 45.7  MCV 93.0   < > 93.3   < > 94.6  --  92.3 93.3 93.2  --  93.5  PLT 378   < > 437*   < > 464*  --  387 383 389  --  406*   < > = values in this interval not displayed.    Basic Metabolic Panel: Recent Labs  Lab 05/06/19 0244 05/06/19 0244 05/07/19 [id number] 05/07/19 [id number] 05/11/2019 0323 05/17/2019 0323 05/18/2019 2226 05/09/19 0226 05/09/19 0710 05/10/19 0442 05/11/19 0423 05/11/19 0500 05/11/19 0504  NA 143   < > 141   < > 143   < > 137  --  140 137 136  --  138  K 4.4   < > 4.2   < > 4.4   < > 4.8  --  4.2 4.0 3.7  --  3.7  CL 108   < > 107   < > 106  --  104  --  105 103  --   --  101  CO2 25   < > 25   < > 27  --  21*  --  23 20*  --   --  22  GLUCOSE 134*   < > 144*   < > 110*  --  114*  --  128* 124*  --   --  158*  BUN 29*   < > 26*   < > 27*  --  30*  --  29* 26*  --   --  33*  CREATININE 1.18   < > 1.21   < > 1.30*   < > 1.37* 1.25* 1.25* 1.23  --   --  1.89*  CALCIUM 9.0   < > 8.8*   < > 9.1  --  8.8*  --  8.7* 8.4*  --   --  8.9  MG 2.4  --  2.5*  --  2.6*  --   --   --   --  2.6*  --  2.5*  --   PHOS  --   --   --   --   --   --   --   --   --   --   --   --  6.7*   < > = values in this interval not displayed.   GFR: Estimated Creatinine Clearance: 53.8 mL/min (A) (by C-G formula based on SCr of 1.89 mg/dL (H)). Recent Labs  Lab 05/06/19 0244 05/06/19 0244 05/07/19 [id number] 05/07/19 [id number] 05/30/2019 0323 05/16/2019 2226 05/09/19 0226 05/09/19 0710 05/10/19 0442 05/11/19 0501 05/11/19 0504 05/11/19 0617  PROCALCITON <0.10  --  <0.10  --  <0.10  --   --  <0.10  --   --   --   --   WBC 9.5   < > 11.8*   < > 11.4*   < > 12.8* 13.1* 11.5*  --  9.2  --   LATICACIDVEN  --   --   --   --   --   --   --   --   --  3.1*  --  2.4*   < > = values in this interval not displayed.    Liver Function Tests: Recent Labs  Lab 05/07/19 0611 05/25/2019 0323 05/07/2019 2226 05/09/19 0710 05/10/19 0442  AST 85* 97* 123* 75* 84*  ALT 96* 115* 172* 133* 141*  ALKPHOS 77 76 97 75 80  BILITOT 0.4 0.7 1.2 0.8 0.8  PROT 5.8* 6.0* 6.2* 5.4* 5.4*  ALBUMIN 2.9* 3.0* 3.2* 2.9* 2.9*   No results for input(s): LIPASE, AMYLASE in the last 168 hours. No results for input(s): AMMONIA in the last 168 hours.  ABG    Component Value Date/Time   PHART 7.391 05/11/2019 0423   PCO2ART 39.2 05/11/2019 0423   PO2ART 74.0 (L) 05/11/2019 0423   HCO3 23.8 05/11/2019 0423   TCO2 25 05/11/2019 0423   ACIDBASEDEF 1.0 05/11/2019 0423   O2SAT 95.0 05/11/2019 0423     Coagulation Profile: No results for input(s): INR, PROTIME in the last 168 hours.  Cardiac Enzymes: No results for input(s): CKTOTAL, CKMB, CKMBINDEX, TROPONINI in the last 168 hours.  HbA1C: Hemoglobin A1C  Date/Time Value Ref Range Status  01/02/2019 09:13 AM 6.1 (A) 4.0 - 5.6 % Final   Hgb A1c MFr Bld  Date/Time Value Ref Range Status  07/01/2018 09:47 AM 6.2 (H) <5.7 % of total Hgb Final    Comment:    For someone without known diabetes, a hemoglobin  A1c value between 5.7% and 6.4% is consistent with prediabetes and should be confirmed with a  follow-up test. . For someone with known diabetes,  a value <7% indicates that their diabetes is well controlled. A1c targets should be individualized based on duration of diabetes, age, comorbid conditions, and other considerations. . This assay result is consistent with an increased risk of diabetes. . Currently, no consensus exists regarding use of hemoglobin A1c for diagnosis of diabetes for children. Marland Kitchen   06/11/2017 04:19 PM 6.3 (H) <5.7 % of total Hgb Final    Comment:    For someone without known diabetes, a hemoglobin  A1c value between 5.7% and 6.4% is consistent with prediabetes and should be confirmed with a  follow-up test. . For someone with known diabetes, a value <7% indicates that their diabetes is well controlled. A1c targets should be individualized based on duration of diabetes, age, comorbid conditions, and other considerations. . This assay result is consistent with an increased risk of diabetes. . Currently, no consensus exists regarding use of hemoglobin A1c for diagnosis of diabetes for children. .     CBG: Recent Labs  Lab 05/11/19 0416 05/11/19 0719  GLUCAP 169* 141*    Review  of Systems:   Unobtainable  Past Medical History  He,  has a past medical history of Chronic neck pain (05/20/2012), Family hx of ALS (amyotrophic lateral sclerosis) (05/27/2013), GERD (gastroesophageal reflux disease), Hiatal hernia, HLD (hyperlipidemia), Major depressive disorder, recurrent episode, moderate ([place]) (05/27/2013), Neck pain, Tobacco abuse, and Wrist fracture, left.   Surgical History    Past Surgical History:  Procedure Laterality Date  . NECK SURGERY     x2- 2010, 2011     Social History   reports that he quit smoking about 12 years ago. He quit after 30.00 years of use. He has never used smokeless tobacco. He reports that he does not drink alcohol or use drugs.   Family History   His family history includes ALS in his brother, maternal aunt, maternal uncle, and mother. There is no history of  Colon cancer, Esophageal cancer, Rectal cancer, or Stomach cancer.   Allergies Allergies  Allergen Reactions  . Gabapentin Other (See Comments)    Severe tingling in the legs and weakness (of the legs)  . Methocarbamol Anaphylaxis, Shortness Of Breath and Other (See Comments)    Respiratory arrest- Turned purple     Home Medications  Prior to Admission medications   Medication Sig Start Date End Date Taking? Authorizing Provider  aspirin 81 MG tablet Take 81 mg by mouth daily.      [provider]  atorvastatin (LIPITOR) 80 MG tablet TAKE 1 TABLET BY MOUTH DAILY AT 6 PM. Patient taking differently: Take 80 mg by mouth daily with supper.  09/12/18   Copland, Frederico Hamman, MD  buPROPion (WELLBUTRIN XL) 300 MG 24 hr tablet TAKE 1 TABLET BY MOUTH DAILY. Patient taking differently: Take 300 mg by mouth daily.  01/24/19   Copland, Frederico Hamman, MD  Cholecalciferol (VITAMIN D3) 50 MCG (2000 UT) TABS Take 2,000 Units by mouth daily with breakfast.    [provider]  citalopram (CELEXA) 40 MG tablet TAKE 1 TABLET BY MOUTH DAILY. Patient taking differently: Take 40 mg by mouth at bedtime.  01/09/19   Copland, Frederico Hamman, MD  clonazePAM (KLONOPIN) 0.5 MG tablet TAKE 1 TABLET BY MOUTH TWICE A DAY AS NEEDED Patient taking differently: Take 0.5 mg by mouth in the morning and at bedtime.  11/28/18   Copland, Frederico Hamman, MD  HYDROcodone-acetaminophen (NORCO) 10-325 MG tablet TAKE 1 TABLET BY MOUTH EVERY 6 HOURS AS NEEDED FOR SEVERE PAIN. Patient not taking: Reported on 05/02/2019 04/03/19   Copland, Frederico Hamman, MD  HYDROcodone-acetaminophen ([place]) 10-325 MG tablet TAKE 1 TABLET BY MOUTH EVERY 6 HOURS AS NEEDED FOR SEVERE PAIN Patient taking differently: Take 0.5-1 tablets by mouth every 6 (six) hours as needed for severe pain.  04/03/19   Copland, Frederico Hamman, MD  HYDROcodone-acetaminophen (NORCO) 10-325 MG tablet Take 1 tablet by mouth every 6 (six) hours as needed for severe pain. Patient not taking: Reported  on 04/04/2019 04/03/19   Owens Loffler, MD  ibuprofen (ADVIL) 200 MG tablet Take 400 mg by mouth every 6 (six) hours as needed (for headaches).    [provider]  Melatonin 10 MG TABS Take 10-20 mg by mouth at bedtime as needed (for sleep).    [provider]  nortriptyline (PAMELOR) 25 MG capsule Take 1 capsule (25 mg total) by mouth at bedtime. Patient not taking: Reported on 04/08/2019 05/04/18   Copland, Frederico Hamman, MD  omeprazole (PRILOSEC) 20 MG capsule TAKE 1 CAPSULE BY MOUTH 2 TIMES DAILY BEFORE A MEAL. Patient taking differently: Take 20 mg by mouth  2 (two) times daily before a meal.  09/16/18   Copland, Frederico Hamman, MD    The patient is critically ill with multiple organ systems failure and requires high complexity decision making for assessment and support, frequent evaluation and titration of therapies, application of advanced monitoring technologies and extensive interpretation of multiple databases. Critical Care Time devoted to patient care services described in this note independent of APP/resident time (if applicable)  is 35 minutes.   Sherrilyn Rist MD [facility] Personal pager: [phone] If unanswered, please page CCM On-call: [phone]

## 2019-05-11 NOTE — Significant Event (Addendum)
Rapid Response Event Note  Overview: Called d/t increased WOB, SpO2-78% on 15L HFNC and NRB. RT on way to place pt on HHFNC. At the beginning of shift, pt was on 15L HFNC, SpO2-91%.   Initial Focused Assessment: Pt sitting up in bed, alert and oriented, will follow commands and move all extremites. Pt is very irritable. Pt has increased WOB, RR-25. Lung sounds diminished t/o. T-98, HR-105, BP-99/70, RR-25, SpO2-84-87% on 30L HHFNC 80% and NRB. Skin warm to touch. Per pt, flow of oxygen thru [place] is tolerable at this time.   0145-Pt SpO2-75%, pt in distress, tripoding,  saying he can't breath and can't tolerate mask, etc. He keeps taking NRB mask off and says he is gonna throw it on the ground. HHFNC increased to 35L 100% and NRB with SpO2 increasing to 86-87%. I don't think he is gonna be tolerable to this mask for very long. Dr. Myna Hidalgo paged and updated on situation and is coming to see pt.    Interventions: HHFNC Low threshold to move to ICU for increasing FiO2 demands.  Dr. Myna Hidalgo bedside: PCXR ABG PCCM to come evaluate pt.  0315-Pt moved to 3M07 and intubated Plan of Care (if not transferred):  Event Summary: Called: 0045 Arrived: 0057 [place] notified at: 0200 [id number] Outcome: Moved to 3M07 [place]

## 2019-05-11 NOTE — Progress Notes (Signed)
eLink Physician-Brief Progress Note Patient Name: Matthew Eaton DOB: [birth year] MRN: [id number]   Date of Service  05/11/2019  HPI/Events of Note  Lactic Acid = 3.1. LVEF = 60-65%.  eICU Interventions  Will bolus with 0.9 NaCl 1 liter IV over 1 hour now.      Intervention Category Major Interventions: Acid-Base disturbance - evaluation and management  [name] 05/11/2019, 5:43 AM

## 2019-05-11 NOTE — Procedures (Signed)
Central Venous Catheter Insertion Procedure Note Sheikh Homewood [id number] [birth year]  Procedure: Insertion of Central Venous Catheter Indications: Drug and/or fluid administration and Hypotension  Procedure Details Consent: Unable to obtain consent because of emergent medical necessity. Time Out: Verified patient identification, verified procedure, site/side was marked, verified correct patient position, special equipment/implants available, medications/allergies/relevent history reviewed, required imaging and test results available.  Performed  Maximum sterile technique was used including antiseptics, cap, gloves, gown, hand hygiene, mask and sheet. Skin prep: Chlorhexidine; local anesthetic administered A antimicrobial bonded/coated triple lumen catheter was placed in the right internal jugular vein using the Seldinger technique.  Evaluation Blood flow good Complications: No apparent complications Patient did tolerate procedure well. Chest X-ray ordered to verify placement.  CXR: normal.  Nelva Hauk A Noni Stonesifer 05/11/2019, 10:40 AM

## 2019-05-11 NOTE — Progress Notes (Signed)
Wife called and updated on patient status and plan of care. Notified him that he is on max ventilator support and in the Prone position to hopes to increase his oxygen levels. All questions answered at this time.

## 2019-05-11 NOTE — Progress Notes (Signed)
RT note: RT and RN's proned patient per MD request. RT taped et tube before prone.

## 2019-05-11 NOTE — Progress Notes (Signed)
PT Cancellation Note  Patient Details Name: Matthew Eaton MRN: [id number] DOB: [birth year]   Cancelled Treatment:    Reason Eval/Treat Not Completed: Medical issues which prohibited therapy.  Pt with status decline.  "Proning" initiated. 05/11/2019  Ginger Carne., PT Acute Rehabilitation Services [phone]  (pager) [phone]  (office)   Tessie Fass Roque Schill 05/11/2019, 1:18 PM

## 2019-05-11 NOTE — Procedures (Signed)
Arterial Catheter Insertion Procedure Note Euell Santorelli [id number] 1960/07/02  Procedure: Insertion of Arterial Catheter  Indications: Blood pressure monitoring and Frequent blood sampling  Procedure Details Consent: Unable to obtain consent because of emergent medical necessity. Time Out: Verified patient identification, verified procedure, site/side was marked, verified correct patient position, special equipment/implants available, medications/allergies/relevent history reviewed, required imaging and test results available.  Performed  Maximum sterile technique was used including antiseptics, cap, gloves, gown, hand hygiene, mask and sheet. Skin prep: Chlorhexidine; local anesthetic administered 20 gauge catheter was inserted into left femoral artery using the Seldinger technique. ULTRASOUND GUIDANCE USED: NO Evaluation Blood flow good; BP tracing good. Complications: No apparent complications.   [place]ra, [place] 05/11/2019

## 2019-05-11 NOTE — Progress Notes (Incomplete)
Patient's O2 sats were dropping.  Oxygen probe changed but patient continued to desat into low 80s, high 70s.  Agricultural consultant notified.  No O2 splitter on unit.  Respiratory therapist contacted but they do not supply splitters.  RT to assess patient. ?

## 2019-05-12 ENCOUNTER — Inpatient Hospital Stay (HOSPITAL_COMMUNITY): Payer: PPO

## 2019-05-12 LAB — D-DIMER, QUANTITATIVE: D-Dimer, Quant: 3.97 ug/mL-FEU — ABNORMAL HIGH (ref 0.00–0.50)

## 2019-05-12 LAB — POCT I-STAT 7, (LYTES, BLD GAS, ICA,H+H)
Acid-base deficit: 5 mmol/L — ABNORMAL HIGH (ref 0.0–2.0)
Bicarbonate: 23.5 mmol/L (ref 20.0–28.0)
Calcium, Ion: 1.27 mmol/L (ref 1.15–1.40)
HCT: 40 % (ref 39.0–52.0)
Hemoglobin: 13.6 g/dL (ref 13.0–17.0)
O2 Saturation: 98 %
Patient temperature: 98.3
Potassium: 4.7 mmol/L (ref 3.5–5.1)
Sodium: 138 mmol/L (ref 135–145)
TCO2: 25 mmol/L (ref 22–32)
pCO2 arterial: 56.1 mmHg — ABNORMAL HIGH (ref 32.0–48.0)
pH, Arterial: 7.229 — ABNORMAL LOW (ref 7.350–7.450)
pO2, Arterial: 136 mmHg — ABNORMAL HIGH (ref 83.0–108.0)

## 2019-05-12 LAB — CBC WITH DIFFERENTIAL/PLATELET
Abs Immature Granulocytes: 0.16 10*3/uL — ABNORMAL HIGH (ref 0.00–0.07)
Basophils Absolute: 0 10*3/uL (ref 0.0–0.1)
Basophils Relative: 0 %
Eosinophils Absolute: 0 10*3/uL (ref 0.0–0.5)
Eosinophils Relative: 0 %
HCT: 44 % (ref 39.0–52.0)
Hemoglobin: 13.6 g/dL (ref 13.0–17.0)
Immature Granulocytes: 1 %
Lymphocytes Relative: 2 %
Lymphs Abs: 0.3 10*3/uL — ABNORMAL LOW (ref 0.7–4.0)
MCH: 30.4 pg (ref 26.0–34.0)
MCHC: 30.9 g/dL (ref 30.0–36.0)
MCV: 98.2 fL (ref 80.0–100.0)
Monocytes Absolute: 0.2 10*3/uL (ref 0.1–1.0)
Monocytes Relative: 2 %
Neutro Abs: 12.1 10*3/uL — ABNORMAL HIGH (ref 1.7–7.7)
Neutrophils Relative %: 95 %
Platelets: 246 10*3/uL (ref 150–400)
RBC: 4.48 MIL/uL (ref 4.22–5.81)
RDW: 14.9 % (ref 11.5–15.5)
WBC: 12.7 10*3/uL — ABNORMAL HIGH (ref 4.0–10.5)
nRBC: 0.2 % (ref 0.0–0.2)

## 2019-05-12 LAB — COMPREHENSIVE METABOLIC PANEL
ALT: 106 U/L — ABNORMAL HIGH (ref 0–44)
AST: 44 U/L — ABNORMAL HIGH (ref 15–41)
Albumin: 2.4 g/dL — ABNORMAL LOW (ref 3.5–5.0)
Alkaline Phosphatase: 94 U/L (ref 38–126)
Anion gap: 10 (ref 5–15)
BUN: 38 mg/dL — ABNORMAL HIGH (ref 6–20)
CO2: 22 mmol/L (ref 22–32)
Calcium: 8 mg/dL — ABNORMAL LOW (ref 8.9–10.3)
Chloride: 106 mmol/L (ref 98–111)
Creatinine, Ser: 1.39 mg/dL — ABNORMAL HIGH (ref 0.61–1.24)
GFR calc Af Amer: >60 mL/min (ref >60)
GFR calc non Af Amer: 55 mL/min — ABNORMAL LOW (ref >60)
Glucose, Bld: 145 mg/dL — ABNORMAL HIGH (ref 70–99)
Potassium: 4.7 mmol/L (ref 3.5–5.1)
Sodium: 138 mmol/L (ref 135–145)
Total Bilirubin: 0.7 mg/dL (ref 0.3–1.2)
Total Protein: 5 g/dL — ABNORMAL LOW (ref 6.5–8.1)

## 2019-05-12 LAB — HEMOGLOBIN A1C
Hgb A1c MFr Bld: 6.4 % — ABNORMAL HIGH (ref 4.8–5.6)
Mean Plasma Glucose: 137 mg/dL

## 2019-05-12 LAB — PHOSPHORUS
Phosphorus: 5 mg/dL — ABNORMAL HIGH (ref 2.5–4.6)
Phosphorus: 3.3 mg/dL (ref 2.5–4.6)

## 2019-05-12 LAB — MAGNESIUM
Magnesium: 2.6 mg/dL — ABNORMAL HIGH (ref 1.7–2.4)
Magnesium: 2.7 mg/dL — ABNORMAL HIGH (ref 1.7–2.4)

## 2019-05-12 LAB — GLUCOSE, CAPILLARY
Glucose-Capillary: 148 mg/dL — ABNORMAL HIGH (ref 70–99)
Glucose-Capillary: 130 mg/dL — ABNORMAL HIGH (ref 70–99)
Glucose-Capillary: 134 mg/dL — ABNORMAL HIGH (ref 70–99)
Glucose-Capillary: 129 mg/dL — ABNORMAL HIGH (ref 70–99)
Glucose-Capillary: 172 mg/dL — ABNORMAL HIGH (ref 70–99)
Glucose-Capillary: 164 mg/dL — ABNORMAL HIGH (ref 70–99)

## 2019-05-12 LAB — BRAIN NATRIURETIC PEPTIDE: B Natriuretic Peptide: 29.5 pg/mL (ref 0.0–100.0)

## 2019-05-12 LAB — TRIGLYCERIDES: Triglycerides: 720 mg/dL — ABNORMAL HIGH (ref <150)

## 2019-05-12 LAB — C-REACTIVE PROTEIN: CRP: 0.7 mg/dL (ref <1.0)

## 2019-05-12 MED ORDER — ENOXAPARIN SODIUM 40 MG/0.4ML ~~LOC~~ SOLN
40.0000 mg | Freq: Two times a day (BID) | SUBCUTANEOUS | Status: DC
  Administered 2019-05-12 – 2019-05-20 (×17): 40 mg via SUBCUTANEOUS
  Filled 2019-05-12 (×17): qty 0.4

## 2019-05-12 NOTE — Progress Notes (Signed)
PT Cancellation Note  Patient Details Name: Matthew Eaton MRN: [id number] DOB: [birth year]   Cancelled Treatment:    Reason Eval/Treat Not Completed: Medical issues which prohibited therapy.  Status decline, will hold today. 05/12/2019  Matthew Carne., PT Acute Rehabilitation Services [phone]  (pager) [phone]  (office)   Matthew Eaton Matthew Eaton 05/12/2019, 10:10 AM

## 2019-05-12 NOTE — Progress Notes (Signed)
eLink Physician-Brief Progress Note Patient Name: Matthew Eaton DOB: 10-10-1960 MRN: [id number]   Date of Service  05/12/2019  HPI/Events of Note  Pt with no twitches, RAS -4 all shift and no ventilator dyssynchrony. ABG: 7.22/ 56.1/ 136/ 25/ 98 %  eICU Interventions  Vecuronium infusion stopped and soft restraints ordered. Respiratory rate on the ventilator increased to 26.        Kerry Kass Ogan 05/12/2019, 5:10 AM

## 2019-05-12 NOTE — Progress Notes (Signed)
eLink Physician-Brief Progress Note Patient Name: Matthew Eaton DOB: 02-20-1960 MRN: [id number]   Date of Service  05/12/2019  HPI/Events of Note    eICU Interventions  Morning labs ordered.        Kerry Kass Matthew Eaton 05/12/2019, 4:19 AM

## 2019-05-12 NOTE — Progress Notes (Signed)
Unable to flush OG tube at beginning of shift. Waited until pt flipped to supine position to retry flushing. Still unable to flush. OG tube pulled back and able to flush. Abdominal X-Ray ordered. Awaiting results. Tube feeds on hold.

## 2019-05-12 NOTE — Progress Notes (Addendum)
Replaced OGT noted to be in distal esophagus; clear air bolus easily instilled and appreciated over gastric antrum. Have administers meds with conservative amt of flush w/ pt in suppine position and semi to high fowler postion. Feel uneasy r/t free H2O and TF until Core trac reliably place post EG junction.

## 2019-05-12 NOTE — Progress Notes (Signed)
RT and RN's turned pt head at this time.

## 2019-05-12 NOTE — Procedures (Signed)
Cortrak  Person Inserting Tube:  Mikalyn Hermida, RD Tube Type:  Cortrak - 43 inches Tube Location:  Left nare Initial Placement:  Stomach Secured by: Bridle Technique Used to Measure Tube Placement:  Documented cm marking at nare/ corner of mouth Cortrak Secured At:  79 cm   No x-ray is required. RN may begin using tube.   If the tube becomes dislodged please keep the tube and contact the Cortrak team at [email or web address] (password [email or web address]) for replacement.  If after hours and replacement cannot be delayed, place a NG tube and confirm placement with an abdominal x-ray.    Mariana Single RD, LDN Clinical Nutrition Pager listed in [place]

## 2019-05-12 NOTE — Progress Notes (Signed)
Assisted tele visit to patient with family member.  Kasandra Fehr Ann, RN  

## 2019-05-12 NOTE — Progress Notes (Signed)
Spoke with wife Joelene Millin) and updated her on pt's condition. All questions answered. Wife asked to video call into pt's room. She was asked to give [place] some time to change shifts and give report and we would get that set up for her.

## 2019-05-12 NOTE — Progress Notes (Signed)
Pt unproned at this time. ABG will be obtained in an hour.

## 2019-05-12 NOTE — Progress Notes (Signed)
Instructed by Dr. Lucile Shutters to turn off paralytic (Vecuronium) due to 0/4 twitches. Paralytic turned off. Will continue to monitor.

## 2019-05-12 NOTE — Progress Notes (Signed)
ABG results given to RN. 

## 2019-05-12 NOTE — Progress Notes (Signed)
Spoke with pt's wife Joelene Millin). All questions answered at this time.

## 2019-05-12 NOTE — Progress Notes (Signed)
NAME:  Matthew Eaton, MRN:  [id number], DOB:  [birth year], LOS: 4 ADMISSION DATE:  05/17/2019, CONSULTATION DATE:  05/11/2019 REFERRING MD:  [facility], CHIEF COMPLAINT:  hypoxemia   Brief History    59 year old with a 9-day history of slowly worsening Covid pneumonia despite appropriate therapy Transferred to unit, intubated 5/8  History of present illness    Patient is a 59 year old male initially admitted 3?31 to 05/05/2019 for Covid pneumonia.  He signed out AMA the evening of 05/06/2019.  Then returned the evening of 4/5 complaining of worsening shortness of breath.  Presenting saturations at that time were in the 80s and he came up into the mid 90s on 100% oxygen.  We were asked to see early this morning in regards to worsening hypoxemia.  The patient was noncompliant with his oxygen.  On high flow nasal cannula at 100% the patient had an oxygen saturation of about 79%.  With the addition of 100% facemask the patient had oxygen saturation in the mid 80s.  He was verbal throughout and did not look particularly dyspneic.  Patient was urgently transferred to the ICU and intubated.  At the time of this dictation he is ventilated on 100% with an oxygen saturation between 85 and 95%. He completed his Decadron remdesivir and Actemra.  Chest x-ray performed earlier this morning shows worsening infiltrate particularly throughout the right lung.  Past Medical History   Past Medical History:  Diagnosis Date  . Chronic neck pain 05/20/2012  . Family hx of ALS (amyotrophic lateral sclerosis) 05/27/2013  . GERD (gastroesophageal reflux disease)   . Hiatal hernia   . HLD (hyperlipidemia)   . Major depressive disorder, recurrent episode, moderate ([place]) 05/27/2013  . Neck pain    cervical spine with herniation  . Tobacco abuse   . Wrist fracture, left    Significant Hospital Events    Transferred to ICU intubation 05/11/2019 Consults:  [facility], PCCM  Procedures:  Intubation  05/11/2019 Central line placement 05/11/2019  Significant Diagnostic Tests:  Covid PCR  Micro Data:  Respiratory cultures-abundant no organisms-yet to be identified  Antimicrobials:  Cefepime 4/7>> Remdesivir-completed  Interim history/subjective:  Hypoxemic, full vent support No overnight events Was proned 05/11/2019  Objective   Blood pressure 125/78, pulse 98, temperature (!) 97.2 F (36.2 C), temperature source Axillary, resp. rate (!) 26, height 5\' 10"  (1.778 m), weight 93.8 kg, SpO2 98 %. CVP:  [5 mmHg-74 mmHg] 12 mmHg  Vent Mode: PRVC FiO2 (%):  [90 %-100 %] 90 % Set Rate:  [22 bmp-26 bmp] 26 bmp Vt Set:  [550 mL] 550 mL PEEP:  [14 cmH20] 14 cmH20 Plateau Pressure:  [24 cmH20-28 cmH20] 26 cmH20    Intake/Output Summary (Last 24 hours) at 05/12/2019 0859 Last data filed at 05/12/2019 0600 Gross per 24 hour  Intake 1549.88 ml  Output 940 ml  Net 609.88 ml   Filed Weights   05/13/2019 2131 05/12/19 0500  Weight: 113.4 kg 93.8 kg   Examination: General: Middle-aged, chronically ill-appearing HENT: Moist oral mucosa Lungs: Decreased air movement bilaterally Cardiovascular: S1-2 appreciated Abdomen: Bowel sounds appreciated Extremities: No clubbing, no edema Neuro: Sedated GU:   Resolved Hospital Problem list     Assessment & Plan:  Covid pneumonia with worsening hypoxemia ARDS   Continue mechanical ventilation per ARDS protocol Target TVol 6-8cc/kgIBW Target Plateau Pressure < 30cm H20 Target driving pressure less than 15 cm of water Target PaO2 55-65: titrate PEEP/FiO2 per protocol As long as PaO2 to  FiO2 ratio is less than 1:150 position in prone position for 16 hours a day Check CVP daily if CVL in place Target CVP less than 4, diurese as necessary Ventilator associated pneumonia prevention protocol  Acute hypoxemic respiratory failure -Secondary to recent Covid pneumonia -Did complete remdesivir and Decadron -Signed out AMA -Admitted with worsening  symptoms -Required proning for severe hypoxemia  Possible pneumonia -Start on antibiotics -Obtained tracheal cultures-no organisms identified at present -Continue cefepime at present  Diabetes -SSI  GERD -PPI  History of major depression History of chronic pain History of chronic opioid use for chronic back pain Diabetes    Will increase respiratory rate to 28 for respiratory acidosis  Best practice:  Diet: Tube feeds Pain/Anxiety/Delirium protocol (if indicated): Fentanyl, Versed VAP protocol (if indicated): In place DVT prophylaxis: Lovenox GI prophylaxis: Protonix Glucose control: SSI Mobility: Bedrest Code Status: Full code Family Communication: Updated spouse Zennon Freet Disposition: ICU  Labs   CBC: Recent Labs  Lab 05/04/2019 0323 05/25/2019 0323 05/20/2019 2226 05/23/2019 2226 05/09/19 0226 05/09/19 0226 05/09/19 0710 05/09/19 0710 05/10/19 0442 05/10/19 0442 05/11/19 0423 05/11/19 0504 05/11/19 1023 05/12/19 0456 05/12/19 0530  WBC 11.4*   < > 14.4*   < > 12.8*  --  13.1*  --  11.5*  --   --  9.2  --   --  12.7*  NEUTROABS 8.5*  --  11.9*  --   --   --  11.1*  --  10.0*  --   --   --   --   --  12.1*  HGB 13.5   < > 14.8   < > 12.9*   < > 13.1   < > 13.2   < > 14.6 15.0 13.9 13.6 13.6  HCT 41.7   < > 45.9   < > 39.6   < > 40.4   < > 41.3   < > 43.0 45.7 41.0 40.0 44.0  MCV 93.3   < > 94.6   < > 92.3  --  93.3  --  93.2  --   --  93.5  --   --  98.2  PLT 437*   < > 464*   < > 387  --  383  --  389  --   --  406*  --   --  246   < > = values in this interval not displayed.    Basic Metabolic Panel: Recent Labs  Lab 05/07/19 0611 05/07/19 [id number] 05/06/2019 0323 05/13/2019 0323 05/29/2019 2226 05/26/2019 2226 05/09/19 0226 05/09/19 0710 05/09/19 0710 05/10/19 0442 05/10/19 0442 05/11/19 0423 05/11/19 0500 05/11/19 0504 05/11/19 1023 05/11/19 1917 05/12/19 0456 05/12/19 0530  NA 141   < > 143   < > 137   < >  --  140   < > 137   < > 136  --   138 138  --  138 138  K 4.2   < > 4.4   < > 4.8   < >  --  4.2   < > 4.0   < > 3.7  --  3.7 3.7  --  4.7 4.7  CL 107   < > 106   < > 104  --   --  105  --  103  --   --   --  101  --   --   --  106  CO2 25   < > 27   < >  21*  --   --  23  --  20*  --   --   --  22  --   --   --  22  GLUCOSE 144*   < > 110*   < > 114*  --   --  128*  --  124*  --   --   --  158*  --   --   --  145*  BUN 26*   < > 27*   < > 30*  --   --  29*  --  26*  --   --   --  33*  --   --   --  38*  CREATININE 1.21   < > 1.30*   < > 1.37*   < > 1.25* 1.25*  --  1.23  --   --   --  1.89*  --   --   --  1.39*  CALCIUM 8.8*   < > 9.1   < > 8.8*  --   --  8.7*  --  8.4*  --   --   --  8.9  --   --   --  8.0*  MG 2.5*  --  2.6*  --   --   --   --   --   --  2.6*  --   --  2.5*  --   --  2.3  --   --   PHOS  --   --   --   --   --   --   --   --   --   --   --   --   --  6.7*  --  6.7*  --   --    < > = values in this interval not displayed.   GFR: Estimated Creatinine Clearance: 66.6 mL/min (A) (by C-G formula based on SCr of 1.39 mg/dL (H)). Recent Labs  Lab 05/06/19 0244 05/06/19 0244 05/07/19 [id number] 05/07/19 [id number] 05/16/2019 0323 05/06/2019 2226 05/09/19 0710 05/10/19 0442 05/11/19 0501 05/11/19 0504 05/11/19 0617 05/12/19 0530  PROCALCITON <0.10  --  <0.10  --  <0.10  --  <0.10  --   --   --   --   --   WBC 9.5   < > 11.8*   < > 11.4*   < > 13.1* 11.5*  --  9.2  --  12.7*  LATICACIDVEN  --   --   --   --   --   --   --   --  3.1*  --  2.4*  --    < > = values in this interval not displayed.    Liver Function Tests: Recent Labs  Lab 05/06/2019 0323 05/22/2019 2226 05/09/19 0710 05/10/19 0442 05/12/19 0530  AST 97* 123* 75* 84* 44*  ALT 115* 172* 133* 141* 106*  ALKPHOS 76 97 75 80 94  BILITOT 0.7 1.2 0.8 0.8 0.7  PROT 6.0* 6.2* 5.4* 5.4* 5.0*  ALBUMIN 3.0* 3.2* 2.9* 2.9* 2.4*   No results for input(s): LIPASE, AMYLASE in the last 168 hours. No results for input(s): AMMONIA in the last 168 hours.  ABG      Component Value Date/Time   PHART 7.229 (L) 05/12/2019 0456   PCO2ART 56.1 (H) 05/12/2019 0456   PO2ART 136.0 (H) 05/12/2019 0456   HCO3 23.5 05/12/2019 0456   TCO2 25 05/12/2019 0456   ACIDBASEDEF 5.0 (H) 05/12/2019 [id number]  O2SAT 98.0 05/12/2019 0456     Coagulation Profile: No results for input(s): INR, PROTIME in the last 168 hours.  Cardiac Enzymes: No results for input(s): CKTOTAL, CKMB, CKMBINDEX, TROPONINI in the last 168 hours.  HbA1C: Hemoglobin A1C  Date/Time Value Ref Range Status  01/02/2019 09:13 AM 6.1 (A) 4.0 - 5.6 % Final   Hgb A1c MFr Bld  Date/Time Value Ref Range Status  05/11/2019 10:27 AM 6.4 (H) 4.8 - 5.6 % Final    Comment:    (NOTE)         Prediabetes: 5.7 - 6.4         Diabetes: >6.4         Glycemic control for adults with diabetes: <7.0   07/01/2018 09:47 AM 6.2 (H) <5.7 % of total Hgb Final    Comment:    For someone without known diabetes, a hemoglobin  A1c value between 5.7% and 6.4% is consistent with prediabetes and should be confirmed with a  follow-up test. . For someone with known diabetes, a value <7% indicates that their diabetes is well controlled. A1c targets should be individualized based on duration of diabetes, age, comorbid conditions, and other considerations. . This assay result is consistent with an increased risk of diabetes. . Currently, no consensus exists regarding use of hemoglobin A1c for diagnosis of diabetes for children. .     CBG: Recent Labs  Lab 05/11/19 1640 05/11/19 1947 05/11/19 2325 05/12/19 0333 05/12/19 0734  GLUCAP 177* 177* 155* 148* 130*    Review of Systems:   Unobtainable  Past Medical History  He,  has a past medical history of Chronic neck pain (05/20/2012), Family hx of ALS (amyotrophic lateral sclerosis) (05/27/2013), GERD (gastroesophageal reflux disease), Hiatal hernia, HLD (hyperlipidemia), Major depressive disorder, recurrent episode, moderate ([facility]) (05/27/2013), Neck pain,  Tobacco abuse, and Wrist fracture, left.   Surgical History    Past Surgical History:  Procedure Laterality Date  . NECK SURGERY     x2- 2010, 2011     Social History   reports that he quit smoking about 12 years ago. He quit after 30.00 years of use. He has never used smokeless tobacco. He reports that he does not drink alcohol or use drugs.   Family History   His family history includes ALS in his brother, maternal aunt, maternal uncle, and mother. There is no history of Colon cancer, Esophageal cancer, Rectal cancer, or Stomach cancer.   Allergies Allergies  Allergen Reactions  . Gabapentin Other (See Comments)    Severe tingling in the legs and weakness (of the legs)  . Methocarbamol Anaphylaxis, Shortness Of Breath and Other (See Comments)    Respiratory arrest- Turned purple    The patient is critically ill with multiple organ systems failure and requires high complexity decision making for assessment and support, frequent evaluation and titration of therapies, application of advanced monitoring technologies and extensive interpretation of multiple databases. Critical Care Time devoted to patient care services described in this note independent of APP/resident time (if applicable)  is 35 minutes.   Sherrilyn Rist MD [facility] Personal pager: [phone] If unanswered, please page CCM On-call: [phone]

## 2019-05-13 ENCOUNTER — Inpatient Hospital Stay (HOSPITAL_COMMUNITY): Payer: PPO

## 2019-05-13 LAB — GLUCOSE, CAPILLARY
Glucose-Capillary: 131 mg/dL — ABNORMAL HIGH (ref 70–99)
Glucose-Capillary: 133 mg/dL — ABNORMAL HIGH (ref 70–99)
Glucose-Capillary: 144 mg/dL — ABNORMAL HIGH (ref 70–99)
Glucose-Capillary: 155 mg/dL — ABNORMAL HIGH (ref 70–99)
Glucose-Capillary: 185 mg/dL — ABNORMAL HIGH (ref 70–99)
Glucose-Capillary: 202 mg/dL — ABNORMAL HIGH (ref 70–99)

## 2019-05-13 LAB — CBC WITH DIFFERENTIAL/PLATELET
Abs Immature Granulocytes: 0.15 10*3/uL — ABNORMAL HIGH (ref 0.00–0.07)
Basophils Absolute: 0 10*3/uL (ref 0.0–0.1)
Basophils Relative: 0 %
Eosinophils Absolute: 0.1 10*3/uL (ref 0.0–0.5)
Eosinophils Relative: 1 %
HCT: 40.8 % (ref 39.0–52.0)
Hemoglobin: 12.9 g/dL — ABNORMAL LOW (ref 13.0–17.0)
Immature Granulocytes: 1 %
Lymphocytes Relative: 3 %
Lymphs Abs: 0.5 10*3/uL — ABNORMAL LOW (ref 0.7–4.0)
MCH: 30.9 pg (ref 26.0–34.0)
MCHC: 31.6 g/dL (ref 30.0–36.0)
MCV: 97.6 fL (ref 80.0–100.0)
Monocytes Absolute: 0.3 10*3/uL (ref 0.1–1.0)
Monocytes Relative: 2 %
Neutro Abs: 13 10*3/uL — ABNORMAL HIGH (ref 1.7–7.7)
Neutrophils Relative %: 93 %
Platelets: 239 10*3/uL (ref 150–400)
RBC: 4.18 MIL/uL — ABNORMAL LOW (ref 4.22–5.81)
RDW: 15.1 % (ref 11.5–15.5)
WBC: 14 10*3/uL — ABNORMAL HIGH (ref 4.0–10.5)
nRBC: 0 % (ref 0.0–0.2)

## 2019-05-13 LAB — BASIC METABOLIC PANEL
Anion gap: 10 (ref 5–15)
BUN: 49 mg/dL — ABNORMAL HIGH (ref 6–20)
CO2: 24 mmol/L (ref 22–32)
Calcium: 8.8 mg/dL — ABNORMAL LOW (ref 8.9–10.3)
Chloride: 108 mmol/L (ref 98–111)
Creatinine, Ser: 1.3 mg/dL — ABNORMAL HIGH (ref 0.61–1.24)
GFR calc Af Amer: >60 mL/min (ref >60)
GFR calc non Af Amer: >60 mL/min (ref >60)
Glucose, Bld: 196 mg/dL — ABNORMAL HIGH (ref 70–99)
Potassium: 4.9 mmol/L (ref 3.5–5.1)
Sodium: 142 mmol/L (ref 135–145)

## 2019-05-13 LAB — POCT I-STAT 7, (LYTES, BLD GAS, ICA,H+H)
Acid-Base Excess: 1 mmol/L (ref 0.0–2.0)
Bicarbonate: 25.9 mmol/L (ref 20.0–28.0)
Calcium, Ion: 1.34 mmol/L (ref 1.15–1.40)
HCT: 33 % — ABNORMAL LOW (ref 39.0–52.0)
Hemoglobin: 11.2 g/dL — ABNORMAL LOW (ref 13.0–17.0)
O2 Saturation: 93 %
Potassium: 4.6 mmol/L (ref 3.5–5.1)
Sodium: 140 mmol/L (ref 135–145)
TCO2: 27 mmol/L (ref 22–32)
pCO2 arterial: 39.5 mmHg (ref 32.0–48.0)
pH, Arterial: 7.425 (ref 7.350–7.450)
pO2, Arterial: 64 mmHg — ABNORMAL LOW (ref 83.0–108.0)

## 2019-05-13 LAB — C-REACTIVE PROTEIN: CRP: 1.2 mg/dL — ABNORMAL HIGH (ref <1.0)

## 2019-05-13 LAB — TRIGLYCERIDES: Triglycerides: 429 mg/dL — ABNORMAL HIGH (ref <150)

## 2019-05-13 LAB — MAGNESIUM: Magnesium: 2.8 mg/dL — ABNORMAL HIGH (ref 1.7–2.4)

## 2019-05-13 LAB — BRAIN NATRIURETIC PEPTIDE: B Natriuretic Peptide: 30 pg/mL (ref 0.0–100.0)

## 2019-05-13 LAB — D-DIMER, QUANTITATIVE: D-Dimer, Quant: 3.05 ug/mL-FEU — ABNORMAL HIGH (ref 0.00–0.50)

## 2019-05-13 LAB — PHOSPHORUS: Phosphorus: 2.1 mg/dL — ABNORMAL LOW (ref 2.5–4.6)

## 2019-05-13 MED ORDER — VECURONIUM BROMIDE 10 MG IV SOLR: 10.0000 mg | Freq: Once | INTRAVENOUS | Status: AC

## 2019-05-13 MED ORDER — VECURONIUM BROMIDE 10 MG IV SOLR
INTRAVENOUS | Status: AC
  Administered 2019-05-13: 12:00:00 10 mg
  Filled 2019-05-13: qty 10

## 2019-05-13 MED ORDER — AMOXICILLIN 250 MG/5ML PO SUSR
1000.0000 mg | Freq: Three times a day (TID) | ORAL | Status: DC
  Administered 2019-05-13 – 2019-05-15 (×7): 1000 mg via ORAL
  Filled 2019-05-13 (×8): qty 20

## 2019-05-13 NOTE — Progress Notes (Signed)
Assisted wife with video/camera time via elink

## 2019-05-13 NOTE — Progress Notes (Addendum)
NAME:  Matthew Eaton, MRN:  [id number], DOB:  Jan 29, 1961, LOS: 5 ADMISSION DATE:  05/29/2019, CONSULTATION DATE:  05/11/2019 REFERRING MD:  [facility], CHIEF COMPLAINT:  hypoxemia   Brief History    59 year old with a 9-day history of slowly worsening Covid pneumonia despite appropriate therapy Transferred to unit, intubated 5/8  History of present illness    Patient is a 59 year old male initially admitted 3?31 to 05/25/2019 for Covid pneumonia.  He signed out AMA the evening of 05/26/2019.  Then returned the evening of 4/5 complaining of worsening shortness of breath.  Presenting saturations at that time were in the 80s and he came up into the mid 90s on 100% oxygen.  We were asked to see early this morning in regards to worsening hypoxemia.  The patient was noncompliant with his oxygen.  On high flow nasal cannula at 100% the patient had an oxygen saturation of about 79%.  With the addition of 100% facemask the patient had oxygen saturation in the mid 80s.  He was verbal throughout and did not look particularly dyspneic.  Patient was urgently transferred to the ICU and intubated.  At the time of this dictation he is ventilated on 100% with an oxygen saturation between 85 and 95%. He completed his Decadron remdesivir and Actemra.  Chest x-ray performed earlier this morning shows worsening infiltrate particularly throughout the right lung.  Past Medical History   Past Medical History:  Diagnosis Date  . Chronic neck pain 05/20/2012  . Family hx of ALS (amyotrophic lateral sclerosis) 05/27/2013  . GERD (gastroesophageal reflux disease)   . Hiatal hernia   . HLD (hyperlipidemia)   . Major depressive disorder, recurrent episode, moderate ([place]) 05/27/2013  . Neck pain    cervical spine with herniation  . Tobacco abuse   . Wrist fracture, left    Significant Hospital Events    Transferred to ICU intubation 05/11/2019 Consults:  [facility], PCCM  Procedures:  Intubation  05/11/2019 Central line placement 05/11/2019  Significant Diagnostic Tests:  Covid PCR  Micro Data:  Respiratory cultures-abundant no organisms-yet to be identified  Antimicrobials:  Cefepime 4/7-4/10 Ampicillin 4/10>> Remdesivir-completed  Interim history/subjective:  Hypoxemic, full vent support No overnight events Was proned 05/12/2019  Objective   Blood pressure 125/72, pulse (!) 101, temperature 99.5 F (37.5 C), temperature source Axillary, resp. rate (!) 27, height 5\' 10"  (1.778 m), weight 94.7 kg, SpO2 93 %. CVP:  [7 mmHg-12 mmHg] 11 mmHg  Vent Mode: PRVC FiO2 (%):  [50 %-90 %] 50 % Set Rate:  [26 bmp-28 bmp] 28 bmp Vt Set:  [550 mL] 550 mL PEEP:  [12 cmH20-14 cmH20] 12 cmH20 Plateau Pressure:  [22 cmH20-26 cmH20] 22 cmH20    Intake/Output Summary (Last 24 hours) at 05/13/2019 [id number] Last data filed at 05/13/2019 0600 Gross per 24 hour  Intake 2541.3 ml  Output 1280 ml  Net 1261.3 ml   Filed Weights   05/06/2019 2131 05/12/19 0500 05/13/19 0421  Weight: 113.4 kg 93.8 kg 94.7 kg   Examination: General: [name], chronically ill-appearing HENT: Moist oral mucosa Lungs: Decreased air movement bilaterally Cardiovascular: S1-2 appreciated Abdomen: Bowel sounds appreciated Extremities: No clubbing, no edema Neuro: Sedated GU:   Chest x-ray does look mildly better Labs reviewed Resolved Hospital Problem list     Assessment & Plan:  Covid pneumonia with worsening hypoxemia ARDS   Continue mechanical ventilation per ARDS protocol Target TVol 6-8cc/kgIBW Target Plateau Pressure < 30cm H20 Target driving pressure less than  15 cm of water Target PaO2 55-65: titrate PEEP/FiO2 per protocol As long as PaO2 to FiO2 ratio is less than 1:150 position in prone position for 16 hours a day Ventilator associated pneumonia prevention protocol  Acute hypoxemic respiratory failure -Secondary to recent Covid pneumonia -Did complete remdesivir and Decadron -Signed  out AMA -Admitted with worsening symptoms -Required proning for severe hypoxemia  Possible pneumonia -Start on antibiotics -Obtained tracheal cultures-abundant Haemophilus influenza, beta-lactamase negative -Switch antibiotics to amoxicillin  Diabetes -SSI  GERD -PPI  History of major depression History of chronic pain History of chronic opioid use for chronic back pain Diabetes    Obtain ABG today Chest x-ray mildly improved Oxygen requirement and PEEP requirement improved Off paralytic  Best practice:  Diet: Tube feeds Pain/Anxiety/Delirium protocol (if indicated): Fentanyl, Versed VAP protocol (if indicated): In place DVT prophylaxis: Lovenox GI prophylaxis: Protonix Glucose control: SSI Mobility: Bedrest Code Status: Full code Family Communication: Updated spouse Denarius Szekely Disposition: ICU  Labs   CBC: Recent Labs  Lab 05/24/2019 2226 05/09/19 0226 05/09/19 0710 05/09/19 0710 05/10/19 0442 05/11/19 0423 05/11/19 0504 05/11/19 1023 05/12/19 0456 05/12/19 0530 05/13/19 0420  WBC 14.4*   < > 13.1*  --  11.5*  --  9.2  --   --  12.7* 14.0*  NEUTROABS 11.9*  --  11.1*  --  10.0*  --   --   --   --  12.1* 13.0*  HGB 14.8   < > 13.1   < > 13.2   < > 15.0 13.9 13.6 13.6 12.9*  HCT 45.9   < > 40.4   < > 41.3   < > 45.7 41.0 40.0 44.0 40.8  MCV 94.6   < > 93.3  --  93.2  --  93.5  --   --  98.2 97.6  PLT 464*   < > 383  --  389  --  406*  --   --  246 239   < > = values in this interval not displayed.    Basic Metabolic Panel: Recent Labs  Lab 05/26/2019 0323 05/09/19 0710 05/10/19 0442 05/11/19 0423 05/11/19 0500 05/11/19 0504 05/11/19 1023 05/11/19 1917 05/12/19 0456 05/12/19 0530 05/12/19 2018 05/13/19 0420  NA   < > 140 137   < >  --  138 138  --  138 138  --  142  K   < > 4.2 4.0   < >  --  3.7 3.7  --  4.7 4.7  --  4.9  CL  --  105 103  --   --  101  --   --   --  106  --  108  CO2  --  23 20*  --   --  22  --   --   --  22  --  24    GLUCOSE  --  128* 124*  --   --  158*  --   --   --  145*  --  196*  BUN  --  29* 26*  --   --  33*  --   --   --  38*  --  49*  CREATININE  --  1.25* 1.23  --   --  1.89*  --   --   --  1.39*  --  1.30*  CALCIUM  --  8.7* 8.4*  --   --  8.9  --   --   --  8.0*  --  8.8*  MG   < >  --  2.6*  --  2.5*  --   --  2.3  --  2.6* 2.7* 2.8*  PHOS  --   --   --   --   --  6.7*  --  6.7*  --  5.0* 3.3 2.1*   < > = values in this interval not displayed.   GFR: Estimated Creatinine Clearance: 71.6 mL/min (A) (by C-G formula based on SCr of 1.3 mg/dL (H)). Recent Labs  Lab 05/07/19 [phone] 05/07/19 [id number] 05/15/2019 0323 05/15/2019 2226 05/09/19 0710 05/09/19 0710 05/10/19 0442 05/11/19 0501 05/11/19 0504 05/11/19 0617 05/12/19 0530 05/13/19 0420  PROCALCITON <0.10  --  <0.10  --  <0.10  --   --   --   --   --   --   --   WBC 11.8*   < > 11.4*   < > 13.1*   < > 11.5*  --  9.2  --  12.7* 14.0*  LATICACIDVEN  --   --   --   --   --   --   --  3.1*  --  2.4*  --   --    < > = values in this interval not displayed.    Liver Function Tests: Recent Labs  Lab 05/07/2019 0323 05/05/2019 2226 05/09/19 0710 05/10/19 0442 05/12/19 0530  AST 97* 123* 75* 84* 44*  ALT 115* 172* 133* 141* 106*  ALKPHOS 76 97 75 80 94  BILITOT 0.7 1.2 0.8 0.8 0.7  PROT 6.0* 6.2* 5.4* 5.4* 5.0*  ALBUMIN 3.0* 3.2* 2.9* 2.9* 2.4*   No results for input(s): LIPASE, AMYLASE in the last 168 hours. No results for input(s): AMMONIA in the last 168 hours.  ABG    Component Value Date/Time   PHART 7.229 (L) 05/12/2019 0456   PCO2ART 56.1 (H) 05/12/2019 0456   PO2ART 136.0 (H) 05/12/2019 0456   HCO3 23.5 05/12/2019 0456   TCO2 25 05/12/2019 0456   ACIDBASEDEF 5.0 (H) 05/12/2019 0456   O2SAT 98.0 05/12/2019 0456     Coagulation Profile: No results for input(s): INR, PROTIME in the last 168 hours.  Cardiac Enzymes: No results for input(s): CKTOTAL, CKMB, CKMBINDEX, TROPONINI in the last 168 hours.  HbA1C: Hemoglobin  A1C  Date/Time Value Ref Range Status  01/02/2019 09:13 AM 6.1 (A) 4.0 - 5.6 % Final   Hgb A1c MFr Bld  Date/Time Value Ref Range Status  05/11/2019 10:27 AM 6.4 (H) 4.8 - 5.6 % Final    Comment:    (NOTE)         Prediabetes: 5.7 - 6.4         Diabetes: >6.4         Glycemic control for adults with diabetes: <7.0   07/01/2018 09:47 AM 6.2 (H) <5.7 % of total Hgb Final    Comment:    For someone without known diabetes, a hemoglobin  A1c value between 5.7% and 6.4% is consistent with prediabetes and should be confirmed with a  follow-up test. . For someone with known diabetes, a value <7% indicates that their diabetes is well controlled. A1c targets should be individualized based on duration of diabetes, age, comorbid conditions, and other considerations. . This assay result is consistent with an increased risk of diabetes. . Currently, no consensus exists regarding use of hemoglobin A1c for diagnosis of diabetes for children. .     CBG: Recent Labs  Lab 05/12/19 1523 05/12/19 1945 05/12/19 2319 05/13/19 0303 05/13/19 0746  GLUCAP 129* 172* 164* 202* 185*    Review of Systems:   Unobtainable  Past Medical History  He,  has a past medical history of Chronic neck pain (05/20/2012), Family hx of ALS (amyotrophic lateral sclerosis) (05/27/2013), GERD (gastroesophageal reflux disease), Hiatal hernia, HLD (hyperlipidemia), Major depressive disorder, recurrent episode, moderate ([facility]) (05/27/2013), Neck pain, Tobacco abuse, and Wrist fracture, left.   Surgical History    Past Surgical History:  Procedure Laterality Date  . NECK SURGERY     x2- 2010, 2011     Social History   reports that he quit smoking about 12 years ago. He quit after 30.00 years of use. He has never used smokeless tobacco. He reports that he does not drink alcohol or use drugs.   Family History   His family history includes ALS in his brother, maternal aunt, maternal uncle, and mother. There is  no history of Colon cancer, Esophageal cancer, Rectal cancer, or Stomach cancer.   Allergies Allergies  Allergen Reactions  . Gabapentin Other (See Comments)    Severe tingling in the legs and weakness (of the legs)  . Methocarbamol Anaphylaxis, Shortness Of Breath and Other (See Comments)    Respiratory arrest- Turned purple    The patient is critically ill with multiple organ systems failure and requires high complexity decision making for assessment and support, frequent evaluation and titration of therapies, application of advanced monitoring technologies and extensive interpretation of multiple databases. Critical Care Time devoted to patient care services described in this note independent of APP/resident time (if applicable)  is 32 minutes.   Sherrilyn Rist MD [facility] Personal pager: [phone] If unanswered, please page CCM On-call: [phone]

## 2019-05-13 NOTE — Progress Notes (Signed)
Assisted tele visit to patient with wife.  Salmaan Patchin P, RN  

## 2019-05-13 NOTE — Progress Notes (Signed)
Assisted tele visit to patient with wife.  Ifeoma Vallin P, RN  

## 2019-05-13 NOTE — Progress Notes (Signed)
Assisted tele visit to patient with wife.  Ai Sonnenfeld P, RN  

## 2019-05-13 NOTE — Progress Notes (Signed)
Patient's sat dropped down to 86.  Patient would come up when suctioned after giving 100% breath.  Put patient on 70% FIO2 and patient sat is now 90%.  Will try to wean again in the morning.

## 2019-05-13 NOTE — Progress Notes (Signed)
Patient asynchronous and double stacking on vent. Patient maxed out on IV Versed and Fentanyl. Updated MD Olalere- ordered 1x dose of Vecuronium 10 mg IV push. Will continue to monitor.

## 2019-05-14 ENCOUNTER — Inpatient Hospital Stay (HOSPITAL_COMMUNITY): Payer: PPO

## 2019-05-14 LAB — POCT I-STAT 7, (LYTES, BLD GAS, ICA,H+H)
Acid-Base Excess: 2 mmol/L (ref 0.0–2.0)
Bicarbonate: 28.1 mmol/L — ABNORMAL HIGH (ref 20.0–28.0)
Calcium, Ion: 1.27 mmol/L (ref 1.15–1.40)
HCT: 33 % — ABNORMAL LOW (ref 39.0–52.0)
Hemoglobin: 11.2 g/dL — ABNORMAL LOW (ref 13.0–17.0)
O2 Saturation: 95 %
Potassium: 4.3 mmol/L (ref 3.5–5.1)
Sodium: 142 mmol/L (ref 135–145)
TCO2: 30 mmol/L (ref 22–32)
pCO2 arterial: 51.5 mmHg — ABNORMAL HIGH (ref 32.0–48.0)
pH, Arterial: 7.345 — ABNORMAL LOW (ref 7.350–7.450)
pO2, Arterial: 84 mmHg (ref 83.0–108.0)

## 2019-05-14 LAB — CBC WITH DIFFERENTIAL/PLATELET
Abs Immature Granulocytes: 0.13 10*3/uL — ABNORMAL HIGH (ref 0.00–0.07)
Basophils Absolute: 0 10*3/uL (ref 0.0–0.1)
Basophils Relative: 0 %
Eosinophils Absolute: 0.3 10*3/uL (ref 0.0–0.5)
Eosinophils Relative: 3 %
HCT: 36 % — ABNORMAL LOW (ref 39.0–52.0)
Hemoglobin: 11.3 g/dL — ABNORMAL LOW (ref 13.0–17.0)
Immature Granulocytes: 1 %
Lymphocytes Relative: 9 %
Lymphs Abs: 1.1 10*3/uL (ref 0.7–4.0)
MCH: 30.8 pg (ref 26.0–34.0)
MCHC: 31.4 g/dL (ref 30.0–36.0)
MCV: 98.1 fL (ref 80.0–100.0)
Monocytes Absolute: 0.6 10*3/uL (ref 0.1–1.0)
Monocytes Relative: 5 %
Neutro Abs: 10.6 10*3/uL — ABNORMAL HIGH (ref 1.7–7.7)
Neutrophils Relative %: 82 %
Platelets: 194 10*3/uL (ref 150–400)
RBC: 3.67 MIL/uL — ABNORMAL LOW (ref 4.22–5.81)
RDW: 15.3 % (ref 11.5–15.5)
WBC: 12.9 10*3/uL — ABNORMAL HIGH (ref 4.0–10.5)
nRBC: 0 % (ref 0.0–0.2)

## 2019-05-14 LAB — BASIC METABOLIC PANEL
Anion gap: 6 (ref 5–15)
BUN: 38 mg/dL — ABNORMAL HIGH (ref 6–20)
CO2: 26 mmol/L (ref 22–32)
Calcium: 8.6 mg/dL — ABNORMAL LOW (ref 8.9–10.3)
Chloride: 109 mmol/L (ref 98–111)
Creatinine, Ser: 1.09 mg/dL (ref 0.61–1.24)
GFR calc Af Amer: >60 mL/min (ref >60)
GFR calc non Af Amer: >60 mL/min (ref >60)
Glucose, Bld: 129 mg/dL — ABNORMAL HIGH (ref 70–99)
Potassium: 4.3 mmol/L (ref 3.5–5.1)
Sodium: 141 mmol/L (ref 135–145)

## 2019-05-14 LAB — CULTURE, RESPIRATORY W GRAM STAIN

## 2019-05-14 LAB — GLUCOSE, CAPILLARY
Glucose-Capillary: 119 mg/dL — ABNORMAL HIGH (ref 70–99)
Glucose-Capillary: 120 mg/dL — ABNORMAL HIGH (ref 70–99)
Glucose-Capillary: 126 mg/dL — ABNORMAL HIGH (ref 70–99)
Glucose-Capillary: 131 mg/dL — ABNORMAL HIGH (ref 70–99)
Glucose-Capillary: 81 mg/dL (ref 70–99)
Glucose-Capillary: 95 mg/dL (ref 70–99)

## 2019-05-14 LAB — TRIGLYCERIDES: Triglycerides: 308 mg/dL — ABNORMAL HIGH (ref <150)

## 2019-05-14 MED ORDER — VECURONIUM BROMIDE 10 MG IV SOLR
0.0000 ug/kg/min | INTRAVENOUS | Status: DC
  Administered 2019-05-14: 0.844 ug/kg/min via INTRAVENOUS
  Administered 2019-05-15: 0.7349 ug/kg/min via INTRAVENOUS
  Filled 2019-05-14 (×3): qty 100

## 2019-05-14 MED ORDER — FUROSEMIDE 10 MG/ML IJ SOLN
40.0000 mg | Freq: Every day | INTRAMUSCULAR | Status: DC
  Administered 2019-05-14 – 2019-05-15 (×2): 40 mg via INTRAVENOUS
  Filled 2019-05-14 (×2): qty 4

## 2019-05-14 MED ORDER — QUETIAPINE FUMARATE 25 MG PO TABS
25.0000 mg | ORAL_TABLET | Freq: Two times a day (BID) | ORAL | Status: DC
  Administered 2019-05-14 – 2019-05-17 (×8): 25 mg via ORAL
  Filled 2019-05-14 (×8): qty 1

## 2019-05-14 MED ORDER — SODIUM CHLORIDE 0.9 % IV SOLN
INTRAVENOUS | Status: DC | PRN
  Administered 2019-05-22: 500 mL via INTRAVENOUS
  Administered 2019-05-24: 250 mL via INTRAVENOUS
  Administered 2019-05-27 – 2019-06-04 (×3): 500 mL via INTRAVENOUS

## 2019-05-14 MED ORDER — VECURONIUM BOLUS VIA INFUSION
0.0800 mg/kg | Freq: Once | INTRAVENOUS | Status: AC
  Administered 2019-05-14: 7.9 mg via INTRAVENOUS
  Filled 2019-05-14: qty 8

## 2019-05-14 NOTE — Progress Notes (Signed)
Patient's head turned to left and arms repositioned.

## 2019-05-14 NOTE — Progress Notes (Signed)
Pt proned at this time with no complications. ETT taped with cloth tape and secured in proper position.

## 2019-05-14 NOTE — Progress Notes (Signed)
NAME:  Matthew Eaton, MRN:  [id number], DOB:  July 29, 1960, LOS: 6 ADMISSION DATE:  05/17/2019, CONSULTATION DATE:  05/11/2019 REFERRING MD:  [facility], CHIEF COMPLAINT:  hypoxemia   Brief History    59 year old with a 9-day history of slowly worsening Covid pneumonia despite appropriate therapy Transferred to unit, intubated 5/8  History of present illness    Patient is a 59 year old male initially admitted 3?31 to 05/19/2019 for Covid pneumonia.  He signed out AMA the evening of 05/13/2019.  Then returned the evening of 4/5 complaining of worsening shortness of breath.  Presenting saturations at that time were in the 80s and he came up into the mid 90s on 100% oxygen.  We were asked to see early this morning in regards to worsening hypoxemia.  The patient was noncompliant with his oxygen.  On high flow nasal cannula at 100% the patient had an oxygen saturation of about 79%.  With the addition of 100% facemask the patient had oxygen saturation in the mid 80s.  He was verbal throughout and did not look particularly dyspneic.  Patient was urgently transferred to the ICU and intubated.  At the time of this dictation he is ventilated on 100% with an oxygen saturation between 85 and 95%. He completed his Decadron remdesivir and Actemra.  Chest x-ray performed earlier this morning shows worsening infiltrate particularly throughout the right lung.  Past Medical History   Past Medical History:  Diagnosis Date  . Chronic neck pain 05/20/2012  . Family hx of ALS (amyotrophic lateral sclerosis) 05/27/2013  . GERD (gastroesophageal reflux disease)   . Hiatal hernia   . HLD (hyperlipidemia)   . Major depressive disorder, recurrent episode, moderate ([place]) 05/27/2013  . Neck pain    cervical spine with herniation  . Tobacco abuse   . Wrist fracture, left    Significant Hospital Events    Transferred to ICU intubation 05/11/2019 Consults:  [facility], PCCM  Procedures:  Intubation  05/11/2019 Central line placement 05/11/2019  Significant Diagnostic Tests:  Covid PCR  Micro Data:  Respiratory cultures-abundant no organisms-yet to be identified  Antimicrobials:  Cefepime 4/7-4/10 Ampicillin 4/10>> Remdesivir-completed  Interim history/subjective:  No overnight events Still with significant FiO2 requirement Some vent dyssynchrony this morning Was proned 05/12/2019  Objective   Blood pressure 92/64, pulse (!) 116, temperature 99 F (37.2 C), temperature source Axillary, resp. rate (!) 24, height 5\' 10"  (1.778 m), weight 98.7 kg, SpO2 93 %. CVP:  [9 mmHg-12 mmHg] 10 mmHg  Vent Mode: PRVC FiO2 (%):  [60 %-70 %] 70 % Set Rate:  [28 bmp] 28 bmp Vt Set:  [550 mL] 550 mL PEEP:  [10 [id number] cmH20] 10 cmH20 Plateau Pressure:  [23 cmH20-24 cmH20] 23 cmH20    Intake/Output Summary (Last 24 hours) at 05/14/2019 0846 Last data filed at 05/14/2019 0700 Gross per 24 hour  Intake 2324.83 ml  Output 1300 ml  Net 1024.83 ml   Filed Weights   05/12/19 0500 05/13/19 0421 05/14/19 0410  Weight: 93.8 kg 94.7 kg 98.7 kg   Examination: General: [name], chronically ill-appearing HENT: Moist mucosa Lungs: Decreased air movement bilaterally Cardiovascular: S1-2 appreciated Abdomen: Bowel sounds appreciated Extremities: No clubbing, no edema Neuro: Sedated GU:   Chest x-ray 4/11-looks stable, no new infiltrates Labs reviewed-CBC, Chem-7 within normal limits  Resolved Hospital Problem list     Assessment & Plan:  Covid pneumonia with worsening hypoxemia ARDS   Continue mechanical ventilation per ARDS protocol Target TVol 6-8cc/kgIBW Target  Plateau Pressure < 30cm H20 Target driving pressure less than 15 cm of water Target PaO2 55-65: titrate PEEP/FiO2 per protocol As long as PaO2 to FiO2 ratio is less than 1:150 position in prone position for 16 hours a day Ventilator associated pneumonia prevention protocol  Acute hypoxemic respiratory  failure -Secondary to recent Covid pneumonia -Did complete remdesivir and Decadron -Signed out AMA -Admitted with worsening symptoms -Required proning for severe hypoxemia, last proned 4/9  Possible pneumonia -Start on antibiotics -Obtained tracheal cultures-abundant Haemophilus influenza, beta-lactamase negative -Switched antibiotics to amoxicillin for influenza  Diabetes -SSI  GERD -PPI  History of major depression History of chronic pain History of chronic opioid use for chronic back pain Diabetes   Required paralytic as needed Chest x-ray stable Oxygen requirement unfortunately did worsen a little bit Requiring high doses of benzodiazepine for sedation -We will add Seroquel, decrease benzodiazepine rate  initiate cautious diuresis  Best practice:  Diet: Tube feeds Pain/Anxiety/Delirium protocol (if indicated): Fentanyl, Versed VAP protocol (if indicated): In place DVT prophylaxis: Lovenox GI prophylaxis: Protonix Glucose control: SSI Mobility: Bedrest Code Status: Full code Family Communication: Updated spouse Leovanni Abaya Disposition: ICU  Labs   CBC: Recent Labs  Lab 05/09/19 0710 05/09/19 0710 05/10/19 0442 05/11/19 0423 05/11/19 0504 05/11/19 1023 05/12/19 0456 05/12/19 0530 05/13/19 0420 05/13/19 0926 05/14/19 0415  WBC 13.1*   < > 11.5*  --  9.2  --   --  12.7* 14.0*  --  12.9*  NEUTROABS 11.1*  --  10.0*  --   --   --   --  12.1* 13.0*  --  10.6*  HGB 13.1   < > 13.2   < > 15.0   < > 13.6 13.6 12.9* 11.2* 11.3*  HCT 40.4   < > 41.3   < > 45.7   < > 40.0 44.0 40.8 33.0* 36.0*  MCV 93.3   < > 93.2  --  93.5  --   --  98.2 97.6  --  98.1  PLT 383   < > 389  --  406*  --   --  246 239  --  194   < > = values in this interval not displayed.    Basic Metabolic Panel: Recent Labs  Lab 05/10/19 0442 05/11/19 0423 05/11/19 0500 05/11/19 0504 05/11/19 1023 05/11/19 1917 05/12/19 0456 05/12/19 0530 05/12/19 2018 05/13/19 0420  05/13/19 0926 05/14/19 0415  NA 137   < >  --  138   < >  --  138 138  --  142 140 141  K 4.0   < >  --  3.7   < >  --  4.7 4.7  --  4.9 4.6 4.3  CL 103  --   --  101  --   --   --  106  --  108  --  109  CO2 20*  --   --  22  --   --   --  22  --  24  --  26  GLUCOSE 124*  --   --  158*  --   --   --  145*  --  196*  --  129*  BUN 26*  --   --  33*  --   --   --  38*  --  49*  --  38*  CREATININE 1.23  --   --  1.89*  --   --   --  1.39*  --  1.30*  --  1.09  CALCIUM 8.4*  --   --  8.9  --   --   --  8.0*  --  8.8*  --  8.6*  MG 2.6*  --  2.5*  --   --  2.3  --  2.6* 2.7* 2.8*  --   --   PHOS  --   --   --  6.7*  --  6.7*  --  5.0* 3.3 2.1*  --   --    < > = values in this interval not displayed.   GFR: Estimated Creatinine Clearance: 87 mL/min (by C-G formula based on SCr of 1.09 mg/dL). Recent Labs  Lab 05/04/2019 0323 05/27/2019 2226 05/09/19 0710 05/10/19 0442 05/11/19 0501 05/11/19 0504 05/11/19 0617 05/12/19 0530 05/13/19 0420 05/14/19 0415  PROCALCITON <0.10  --  <0.10  --   --   --   --   --   --   --   WBC 11.4*   < > 13.1*   < >  --  9.2  --  12.7* 14.0* 12.9*  LATICACIDVEN  --   --   --   --  3.1*  --  2.4*  --   --   --    < > = values in this interval not displayed.    Liver Function Tests: Recent Labs  Lab 05/06/2019 0323 05/27/2019 2226 05/09/19 0710 05/10/19 0442 05/12/19 0530  AST 97* 123* 75* 84* 44*  ALT 115* 172* 133* 141* 106*  ALKPHOS 76 97 75 80 94  BILITOT 0.7 1.2 0.8 0.8 0.7  PROT 6.0* 6.2* 5.4* 5.4* 5.0*  ALBUMIN 3.0* 3.2* 2.9* 2.9* 2.4*   No results for input(s): LIPASE, AMYLASE in the last 168 hours. No results for input(s): AMMONIA in the last 168 hours.  ABG    Component Value Date/Time   PHART 7.425 05/13/2019 0926   PCO2ART 39.5 05/13/2019 0926   PO2ART 64.0 (L) 05/13/2019 0926   HCO3 25.9 05/13/2019 0926   TCO2 27 05/13/2019 0926   ACIDBASEDEF 5.0 (H) 05/12/2019 0456   O2SAT 93.0 05/13/2019 0926     Coagulation Profile: No  results for input(s): INR, PROTIME in the last 168 hours.  Cardiac Enzymes: No results for input(s): CKTOTAL, CKMB, CKMBINDEX, TROPONINI in the last 168 hours.  HbA1C: Hemoglobin A1C  Date/Time Value Ref Range Status  01/02/2019 09:13 AM 6.1 (A) 4.0 - 5.6 % Final   Hgb A1c MFr Bld  Date/Time Value Ref Range Status  05/11/2019 10:27 AM 6.4 (H) 4.8 - 5.6 % Final    Comment:    (NOTE)         Prediabetes: 5.7 - 6.4         Diabetes: >6.4         Glycemic control for adults with diabetes: <7.0   07/01/2018 09:47 AM 6.2 (H) <5.7 % of total Hgb Final    Comment:    For someone without known diabetes, a hemoglobin  A1c value between 5.7% and 6.4% is consistent with prediabetes and should be confirmed with a  follow-up test. . For someone with known diabetes, a value <7% indicates that their diabetes is well controlled. A1c targets should be individualized based on duration of diabetes, age, comorbid conditions, and other considerations. . This assay result is consistent with an increased risk of diabetes. . Currently, no consensus exists regarding use of hemoglobin A1c for diagnosis of diabetes for children. Marland Kitchen  CBG: Recent Labs  Lab 05/13/19 1611 05/13/19 1944 05/13/19 2336 05/14/19 0321 05/14/19 0820  GLUCAP 131* 133* 144* 119* 81    Review of Systems:   Unobtainable  Past Medical History  He,  has a past medical history of Chronic neck pain (05/20/2012), Family hx of ALS (amyotrophic lateral sclerosis) (05/27/2013), GERD (gastroesophageal reflux disease), Hiatal hernia, HLD (hyperlipidemia), Major depressive disorder, recurrent episode, moderate ([place]) (05/27/2013), Neck pain, Tobacco abuse, and Wrist fracture, left.   Surgical History    Past Surgical History:  Procedure Laterality Date  . NECK SURGERY     x2- 2010, 2011     Social History   reports that he quit smoking about 12 years ago. He quit after 30.00 years of use. He has never used smokeless  tobacco. He reports that he does not drink alcohol or use drugs.   Family History   His family history includes ALS in his brother, maternal aunt, maternal uncle, and mother. There is no history of Colon cancer, Esophageal cancer, Rectal cancer, or Stomach cancer.   Allergies Allergies  Allergen Reactions  . Gabapentin Other (See Comments)    Severe tingling in the legs and weakness (of the legs)  . Methocarbamol Anaphylaxis, Shortness Of Breath and Other (See Comments)    Respiratory arrest- Turned purple    The patient is critically ill with multiple organ systems failure and requires high complexity decision making for assessment and support, frequent evaluation and titration of therapies, application of advanced monitoring technologies and extensive interpretation of multiple databases. Critical Care Time devoted to patient care services described in this note independent of APP/resident time (if applicable)  is 35 minutes.   Sherrilyn Rist MD [facility] Personal pager: [phone] If unanswered, please page CCM On-call: [phone]

## 2019-05-14 NOTE — Progress Notes (Signed)
Dyssynchronous with the vent, appears sedated enough  Will add paralytic and prone as needed

## 2019-05-14 NOTE — Progress Notes (Signed)
Patient's head turned to right and arms repositioned.

## 2019-05-14 NOTE — Progress Notes (Signed)
Assisted tele visit to patient with wife.  Kincade Granberg P, RN  

## 2019-05-14 NOTE — Progress Notes (Signed)
Updated spouse about patient status

## 2019-05-14 NOTE — Progress Notes (Signed)
Pt w/ increased WOB as AM progressed. Increased to FiO2 100% w/ sats in 80s. Decision to prone prepared for and accomplished at 1115. Pt tolerated well w/ vss. Pt's wife updated at intervals throughout process.  Pt appears comfortable w/o distress.

## 2019-05-14 NOTE — Progress Notes (Signed)
Assisted tele visit to patient with wife.  Waynetta Metheny P, RN  

## 2019-05-14 NOTE — Progress Notes (Signed)
Assisted tele visit to patient with wife.  Dawnmarie Breon P, RN  

## 2019-05-15 ENCOUNTER — Inpatient Hospital Stay (HOSPITAL_COMMUNITY): Payer: PPO

## 2019-05-15 LAB — CBC WITH DIFFERENTIAL/PLATELET
Abs Immature Granulocytes: 0.1 10*3/uL — ABNORMAL HIGH (ref 0.00–0.07)
Basophils Absolute: 0 10*3/uL (ref 0.0–0.1)
Basophils Relative: 0 %
Eosinophils Absolute: 0.3 10*3/uL (ref 0.0–0.5)
Eosinophils Relative: 3 %
HCT: 35.7 % — ABNORMAL LOW (ref 39.0–52.0)
Hemoglobin: 11 g/dL — ABNORMAL LOW (ref 13.0–17.0)
Immature Granulocytes: 1 %
Lymphocytes Relative: 8 %
Lymphs Abs: 0.9 10*3/uL (ref 0.7–4.0)
MCH: 30.5 pg (ref 26.0–34.0)
MCHC: 30.8 g/dL (ref 30.0–36.0)
MCV: 98.9 fL (ref 80.0–100.0)
Monocytes Absolute: 0.4 10*3/uL (ref 0.1–1.0)
Monocytes Relative: 3 %
Neutro Abs: 9.5 10*3/uL — ABNORMAL HIGH (ref 1.7–7.7)
Neutrophils Relative %: 85 %
Platelets: 158 10*3/uL (ref 150–400)
RBC: 3.61 MIL/uL — ABNORMAL LOW (ref 4.22–5.81)
RDW: 15.4 % (ref 11.5–15.5)
WBC: 11.2 10*3/uL — ABNORMAL HIGH (ref 4.0–10.5)
nRBC: 0 % (ref 0.0–0.2)

## 2019-05-15 LAB — POCT I-STAT 7, (LYTES, BLD GAS, ICA,H+H)
Acid-Base Excess: 1 mmol/L (ref 0.0–2.0)
Bicarbonate: 25.7 mmol/L (ref 20.0–28.0)
Calcium, Ion: 1.27 mmol/L (ref 1.15–1.40)
HCT: 30 % — ABNORMAL LOW (ref 39.0–52.0)
Hemoglobin: 10.2 g/dL — ABNORMAL LOW (ref 13.0–17.0)
O2 Saturation: 97 %
Patient temperature: 98.6
Potassium: 4.3 mmol/L (ref 3.5–5.1)
Sodium: 140 mmol/L (ref 135–145)
TCO2: 27 mmol/L (ref 22–32)
pCO2 arterial: 39.5 mmHg (ref 32.0–48.0)
pH, Arterial: 7.422 (ref 7.350–7.450)
pO2, Arterial: 87 mmHg (ref 83.0–108.0)

## 2019-05-15 LAB — GLUCOSE, CAPILLARY
Glucose-Capillary: 124 mg/dL — ABNORMAL HIGH (ref 70–99)
Glucose-Capillary: 127 mg/dL — ABNORMAL HIGH (ref 70–99)
Glucose-Capillary: 129 mg/dL — ABNORMAL HIGH (ref 70–99)
Glucose-Capillary: 130 mg/dL — ABNORMAL HIGH (ref 70–99)
Glucose-Capillary: 147 mg/dL — ABNORMAL HIGH (ref 70–99)
Glucose-Capillary: 147 mg/dL — ABNORMAL HIGH (ref 70–99)

## 2019-05-15 LAB — BASIC METABOLIC PANEL
Anion gap: 9 (ref 5–15)
BUN: 44 mg/dL — ABNORMAL HIGH (ref 6–20)
CO2: 25 mmol/L (ref 22–32)
Calcium: 8.3 mg/dL — ABNORMAL LOW (ref 8.9–10.3)
Chloride: 106 mmol/L (ref 98–111)
Creatinine, Ser: 0.89 mg/dL (ref 0.61–1.24)
GFR calc Af Amer: >60 mL/min (ref >60)
GFR calc non Af Amer: >60 mL/min (ref >60)
Glucose, Bld: 149 mg/dL — ABNORMAL HIGH (ref 70–99)
Potassium: 4.4 mmol/L (ref 3.5–5.1)
Sodium: 140 mmol/L (ref 135–145)

## 2019-05-15 LAB — MRSA PCR SCREENING: MRSA by PCR: NEGATIVE

## 2019-05-15 MED ORDER — SODIUM CHLORIDE 0.9% FLUSH
10.0000 mL | Freq: Two times a day (BID) | INTRAVENOUS | Status: DC
  Administered 2019-05-15 – 2019-06-05 (×38): 10 mL

## 2019-05-15 MED ORDER — VECURONIUM BROMIDE 10 MG IV SOLR: 10.0000 mg | INTRAVENOUS | Status: DC | PRN

## 2019-05-15 MED ORDER — AMOXICILLIN-POT CLAVULANATE 875-125 MG PO TABS
1.0000 | ORAL_TABLET | Freq: Two times a day (BID) | ORAL | Status: AC
  Administered 2019-05-15 – 2019-05-19 (×10): 1 via ORAL
  Filled 2019-05-15 (×10): qty 1

## 2019-05-15 MED ORDER — SODIUM CHLORIDE 0.9% FLUSH: 10.0000 mL | INTRAVENOUS | Status: DC | PRN

## 2019-05-15 NOTE — Progress Notes (Signed)
NAME:  Matthew Eaton, MRN:  [id number], DOB:  1960-02-23, LOS: 7 ADMISSION DATE:  05/26/2019, CONSULTATION DATE:  05/11/2019 REFERRING MD:  [facility], CHIEF COMPLAINT:  hypoxemia   Brief History    59 year old with a 9-day history of slowly worsening Covid pneumonia despite appropriate therapy Transferred to unit, intubated 5/8  History of present illness    Patient is a 59 year old male initially admitted 3?31 to 05/27/2019 for Covid pneumonia.  He signed out AMA the evening of 05/17/2019.  Then returned the evening of 4/5 complaining of worsening shortness of breath.  Presenting saturations at that time were in the 80s and he came up into the mid 90s on 100% oxygen.  We were asked to see early this morning in regards to worsening hypoxemia.  The patient was noncompliant with his oxygen.  On high flow nasal cannula at 100% the patient had an oxygen saturation of about 79%.  With the addition of 100% facemask the patient had oxygen saturation in the mid 80s.  He was verbal throughout and did not look particularly dyspneic.  Patient was urgently transferred to the ICU and intubated.  At the time of this dictation he is ventilated on 100% with an oxygen saturation between 85 and 95%. He completed his Decadron remdesivir and Actemra.  Chest x-ray performed earlier this morning shows worsening infiltrate particularly throughout the right lung.  Past Medical History   Past Medical History:  Diagnosis Date  . Chronic neck pain 05/20/2012  . Family hx of ALS (amyotrophic lateral sclerosis) 05/27/2013  . GERD (gastroesophageal reflux disease)   . Hiatal hernia   . HLD (hyperlipidemia)   . Major depressive disorder, recurrent episode, moderate ([place]) 05/27/2013  . Neck pain    cervical spine with herniation  . Tobacco abuse   . Wrist fracture, left    Significant Hospital Events    Transferred to ICU intubation 05/11/2019 4/11-proning  Consults:  [facility], PCCM  Procedures:    Intubation 05/11/2019 Central line placement 05/11/2019  Significant Diagnostic Tests:  Covid PCR  Micro Data:  Respiratory cultures-abundant no organisms-yet to be identified  Antimicrobials:  Cefepime 4/7-4/10 Ampicillin 4/10-4/11 Augmentin4/12>> Remdesivir-completed  Interim history/subjective:  No overnight events Still with significant FiO2 requirement Some vent dyssynchrony this morning Was proned 05/12/2019  Objective   Blood pressure 106/68, pulse 91, temperature 99.2 F (37.3 C), temperature source Oral, resp. rate (!) 28, height 5\' 10"  (1.778 m), weight 96.1 kg, SpO2 98 %. CVP:  [4 mmHg-19 mmHg] 6 mmHg  Vent Mode: PRVC FiO2 (%):  [70 %-90 %] 70 % Set Rate:  [28 bmp] 28 bmp Vt Set:  [550 mL] 550 mL PEEP:  [10 cmH20] 10 cmH20 Plateau Pressure:  [23 cmH20-25 cmH20] 25 cmH20    Intake/Output Summary (Last 24 hours) at 05/15/2019 0844 Last data filed at 05/15/2019 0800 Gross per 24 hour  Intake 3450.99 ml  Output 2240 ml  Net 1210.99 ml   Filed Weights   05/13/19 0421 05/14/19 0410 05/15/19 0400  Weight: 94.7 kg 98.7 kg 96.1 kg   Examination: General: [name], chronically ill-appearing HENT: Moist mucosa Lungs: Decreased air movement bilaterally Cardiovascular: S1-2 appreciated Abdomen: Bowel sounds appreciated Extremities: No clubbing, no edema Neuro: Sedated GU:   Chest x-ray 4/12-slight improvement in infiltrative process, reviewed by myself Labs reviewed-no significant leukocytosis, BMP within normal limits ABG 7.4 2/39/87  Resolved Hospital Problem list     Assessment & Plan:  Covid pneumonia with worsening hypoxemia ARDS  Continue mechanical ventilation per ARDS protocol Target TVol 6-8cc/kgIBW Target Plateau Pressure < 30cm H20 Target driving pressure less than 15 cm of water Target PaO2 55-65: titrate PEEP/FiO2 per protocol As long as PaO2 to FiO2 ratio is less than 1:150 position in prone position for 16 hours a day Ventilator  associated pneumonia prevention protocol  Acute hypoxemic respiratory failure -Secondary to recent Covid pneumonia -Did complete remdesivir and Decadron -Signed out AMA -Admitted with worsening symptoms -Required proning for severe hypoxemia, last proned 4/9, prone again 05/14/2019 -Oxygen requirement much better today  Possible pneumonia -Started on antibiotics -Obtained tracheal cultures-abundant Haemophilus influenza, beta-lactamase negative and staph aureus -Switched antibiotics to amoxicillin, switch to Augmentin 05/15/2019  Diabetes -SSI  GERD -PPI  History of major depression History of chronic pain 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 Skip chest x-ray 4/12  Best practice:  Diet: Tube feeds Pain/Anxiety/Delirium protocol (if indicated): Fentanyl, Versed VAP protocol (if indicated): In place DVT prophylaxis: Lovenox GI prophylaxis: Protonix Glucose control: SSI Mobility: Bedrest Code Status: Full code Family Communication: Will update Disposition: ICU  Labs   CBC: Recent Labs  Lab 05/10/19 0442 05/11/19 0423 05/11/19 0504 05/11/19 1023 05/12/19 0530 05/12/19 0530 05/13/19 0420 05/13/19 0420 05/13/19 0926 05/14/19 0415 05/14/19 1408 05/15/19 0402 05/15/19 0433  WBC 11.5*  --  9.2  --  12.7*  --  14.0*  --   --  12.9*  --  11.2*  --   NEUTROABS 10.0*  --   --   --  12.1*  --  13.0*  --   --  10.6*  --  9.5*  --   HGB 13.2   < > 15.0   < > 13.6   < > 12.9*   < > 11.2* 11.3* 11.2* 11.0* 10.2*  HCT 41.3   < > 45.7   < > 44.0   < > 40.8   < > 33.0* 36.0* 33.0* 35.7* 30.0*  MCV 93.2  --  93.5  --  98.2  --  97.6  --   --  98.1  --   98.9  --   PLT 389  --  406*  --  246  --  239  --   --  194  --  158  --    < > = values in this interval not displayed.    Basic Metabolic Panel: Recent Labs  Lab 05/10/19 0442 05/11/19 0500 05/11/19 0504 05/11/19 1023 05/11/19 1917 05/12/19 0456 05/12/19 0530 05/12/19 0530 05/12/19 2018 05/13/19 0420 05/13/19 0420 05/13/19 0926 05/14/19 0415 05/14/19 1408 05/15/19 0402 05/15/19 0433  NA   < >  --  138   < >  --    < > 138   < >  --  142   < > 140 141 142 140 140  K   < >  --  3.7   < >  --    < > 4.7   < >  --  4.9   < > 4.6 4.3 4.3 4.4 4.3  CL   < >  --  101  --   --   --  106  --   --  108  --   --  109  --  106  --   CO2   < >  --  22  --   --   --  22  --   --  24  --   --  26  --  25  --   GLUCOSE   < >  --  158*  --   --   --  145*  --   --  196*  --   --  129*  --  149*  --   BUN   < >  --  33*  --   --   --  38*  --   --  49*  --   --  38*  --  44*  --   CREATININE   < >  --  1.89*  --   --   --  1.39*  --   --  1.30*  --   --  1.09  --  0.89  --   CALCIUM   < >  --  8.9  --   --   --  8.0*  --   --  8.8*  --   --  8.6*  --  8.3*  --   MG  --  2.5*  --   --  2.3  --  2.6*  --  2.7* 2.8*  --   --   --   --   --   --   PHOS  --   --  6.7*  --  6.7*  --  5.0*  --  3.3 2.1*  --   --   --   --   --   --    < > = values in this interval not displayed.   GFR: Estimated Creatinine Clearance: 105.2 mL/min (by C-G formula based on SCr of 0.89 mg/dL). Recent Labs  Lab 05/09/19 0710 05/10/19 0442 05/11/19 0501 05/11/19 0504 05/11/19 0617 05/12/19 0530 05/13/19 0420 05/14/19 0415 05/15/19 0402  PROCALCITON <0.10  --   --   --   --   --   --   --   --   WBC 13.1*   < >  --    < >  --  12.7* 14.0* 12.9* 11.2*  LATICACIDVEN  --   --  3.1*  --  2.4*  --   --   --   --    < > = values in this interval not displayed.    Liver Function Tests: Recent Labs  Lab 05/22/2019 2226 05/09/19 0710 05/10/19 0442 05/12/19 0530  AST 123* 75* 84* 44*  ALT 172* 133* 141* 106*    ALKPHOS 97 75 80 94  BILITOT 1.2 0.8 0.8 0.7  PROT 6.2* 5.4* 5.4* 5.0*  ALBUMIN 3.2* 2.9* 2.9* 2.4*   No results for input(s): LIPASE, AMYLASE in the last 168 hours. No results for input(s): AMMONIA in the last 168 hours.  ABG    Component Value Date/Time   PHART 7.422 05/15/2019 0433   PCO2ART 39.5 05/15/2019 0433   PO2ART 87.0 05/15/2019 0433   HCO3 25.7 05/15/2019 0433   TCO2 27 05/15/2019 0433   ACIDBASEDEF 5.0 (H) 05/12/2019 0456   O2SAT 97.0 05/15/2019 0433     Coagulation Profile: No results for input(s): INR, PROTIME in the last 168 hours.  Cardiac Enzymes: No results for input(s): CKTOTAL, CKMB, CKMBINDEX, TROPONINI in the last 168 hours.  HbA1C: Hemoglobin A1C  Date/Time Value Ref Range Status  01/02/2019 09:13 AM 6.1 (A) 4.0 - 5.6 % Final   Hgb A1c MFr Bld  Date/Time Value Ref Range Status  05/11/2019 10:27 AM 6.4 (H) 4.8 - 5.6 % Final    Comment:    (NOTE)         Prediabetes: 5.7 - 6.4         Diabetes: >6.4         Glycemic control for adults with diabetes: <7.0   07/01/2018 09:47 AM 6.2 (H) <5.7 % of total Hgb Final    Comment:    For someone without known diabetes, a hemoglobin  A1c value between 5.7% and 6.4% is consistent with prediabetes and should be confirmed with a  follow-up test. . For someone with known diabetes, a value <7% indicates that their diabetes is well controlled. A1c targets should be individualized based on duration of diabetes, age, comorbid conditions, and other considerations. . This assay result is consistent with an increased risk of diabetes. . Currently, no consensus exists regarding use of hemoglobin A1c for diagnosis of diabetes for children. .     CBG: Recent Labs  Lab 05/14/19 1440 05/14/19 1951 05/14/19 2339 05/15/19 0357 05/15/19 0751  GLUCAP 131* 120* 126* 147* 130*    Review of Systems:   Unobtainable  Past Medical History  He,  has a past medical history of Chronic neck pain  (05/20/2012), Family hx of ALS (amyotrophic lateral sclerosis) (05/27/2013), GERD (gastroesophageal reflux disease), Hiatal hernia, HLD (hyperlipidemia), Major depressive disorder, recurrent episode, moderate ([facility]) (05/27/2013), Neck pain, Tobacco abuse, and Wrist fracture, left.   Surgical History    Past Surgical History:  Procedure Laterality Date  . NECK SURGERY     x2- 2010, 2011     Social History   reports that he quit smoking about 12 years ago. He quit after 30.00 years of use. He has never used smokeless tobacco. He reports that he does not drink alcohol or use drugs.   Family History   His family history includes ALS in his brother, maternal aunt, maternal uncle, and mother. There is no history of Colon cancer, Esophageal cancer, Rectal cancer, or Stomach cancer.   Allergies Allergies  Allergen Reactions  . Gabapentin Other (See Comments)    Severe tingling in the legs and weakness (of the legs)  . Methocarbamol Anaphylaxis, Shortness Of Breath and Other (See Comments)    Respiratory arrest- Turned purple    The patient is critically ill with multiple organ systems failure and requires high complexity decision making for assessment and support, frequent evaluation and titration of therapies, application of advanced monitoring technologies and extensive interpretation of multiple databases. Critical Care Time devoted to patient care services described in this note independent of APP/resident time (if applicable)  is 32 minutes.   Sherrilyn Rist MD [facility] Personal pager: [phone] If unanswered, please page CCM On-call: [phone]

## 2019-05-15 NOTE — Plan of Care (Signed)
  Problem: Elimination: Goal: Will not experience complications related to bowel motility Outcome: Progressing Goal: Will not experience complications related to urinary retention Outcome: Progressing   

## 2019-05-15 NOTE — Progress Notes (Signed)
Assisted tele visit to patient with wife.  Hajra Port P, RN  

## 2019-05-15 NOTE — Progress Notes (Signed)
Assisted wife with camera/video time via elink

## 2019-05-15 NOTE — Progress Notes (Signed)
Patient placed in supine position with no complications noted.

## 2019-05-15 NOTE — Progress Notes (Signed)
PT Cancellation Note  Patient Details Name: Khyron Hollenberg MRN: [id number] DOB: 1960/12/11   Cancelled Treatment:    Reason Eval/Treat Not Completed: Medical issues which prohibited therapy.  Pt still struggling, continues on paralytics. 05/15/2019  Ginger Carne., PT [facility] [phone]  (pager) [phone]  (office)   Tessie Fass Iaan Oregel 05/15/2019, 10:12 AM

## 2019-05-15 NOTE — Progress Notes (Signed)
Patient's head turned to left and arms repositioned.

## 2019-05-16 DIAGNOSIS — U071 COVID-19: Secondary | ICD-10-CM | POA: Diagnosis not present

## 2019-05-16 DIAGNOSIS — J9601 Acute respiratory failure with hypoxia: Secondary | ICD-10-CM | POA: Diagnosis not present

## 2019-05-16 LAB — GLUCOSE, CAPILLARY
Glucose-Capillary: 115 mg/dL — ABNORMAL HIGH (ref 70–99)
Glucose-Capillary: 138 mg/dL — ABNORMAL HIGH (ref 70–99)
Glucose-Capillary: 143 mg/dL — ABNORMAL HIGH (ref 70–99)
Glucose-Capillary: 146 mg/dL — ABNORMAL HIGH (ref 70–99)
Glucose-Capillary: 155 mg/dL — ABNORMAL HIGH (ref 70–99)
Glucose-Capillary: 163 mg/dL — ABNORMAL HIGH (ref 70–99)

## 2019-05-16 LAB — BASIC METABOLIC PANEL
Anion gap: 6 (ref 5–15)
BUN: 43 mg/dL — ABNORMAL HIGH (ref 6–20)
CO2: 22 mmol/L (ref 22–32)
Calcium: 7.1 mg/dL — ABNORMAL LOW (ref 8.9–10.3)
Chloride: 116 mmol/L — ABNORMAL HIGH (ref 98–111)
Creatinine, Ser: 0.8 mg/dL (ref 0.61–1.24)
GFR calc Af Amer: >60 mL/min (ref >60)
GFR calc non Af Amer: >60 mL/min (ref >60)
Glucose, Bld: 126 mg/dL — ABNORMAL HIGH (ref 70–99)
Potassium: 3.5 mmol/L (ref 3.5–5.1)
Sodium: 144 mmol/L (ref 135–145)

## 2019-05-16 LAB — CULTURE, BLOOD (ROUTINE X 2)
Culture: NO GROWTH
Culture: NO GROWTH
Special Requests: ADEQUATE
Special Requests: ADEQUATE

## 2019-05-16 LAB — CBC WITH DIFFERENTIAL/PLATELET
Abs Immature Granulocytes: 0.23 10*3/uL — ABNORMAL HIGH (ref 0.00–0.07)
Basophils Absolute: 0 10*3/uL (ref 0.0–0.1)
Basophils Relative: 0 %
Eosinophils Absolute: 0.2 10*3/uL (ref 0.0–0.5)
Eosinophils Relative: 2 %
HCT: 36.4 % — ABNORMAL LOW (ref 39.0–52.0)
Hemoglobin: 11.2 g/dL — ABNORMAL LOW (ref 13.0–17.0)
Immature Granulocytes: 2 %
Lymphocytes Relative: 6 %
Lymphs Abs: 0.7 10*3/uL (ref 0.7–4.0)
MCH: 30.9 pg (ref 26.0–34.0)
MCHC: 30.8 g/dL (ref 30.0–36.0)
MCV: 100.6 fL — ABNORMAL HIGH (ref 80.0–100.0)
Monocytes Absolute: 0.7 10*3/uL (ref 0.1–1.0)
Monocytes Relative: 5 %
Neutro Abs: 11 10*3/uL — ABNORMAL HIGH (ref 1.7–7.7)
Neutrophils Relative %: 85 %
Platelets: 158 10*3/uL (ref 150–400)
RBC: 3.62 MIL/uL — ABNORMAL LOW (ref 4.22–5.81)
RDW: 15.7 % — ABNORMAL HIGH (ref 11.5–15.5)
WBC: 12.9 10*3/uL — ABNORMAL HIGH (ref 4.0–10.5)
nRBC: 0.2 % (ref 0.0–0.2)

## 2019-05-16 LAB — POCT I-STAT 7, (LYTES, BLD GAS, ICA,H+H)
Acid-Base Excess: 3 mmol/L — ABNORMAL HIGH (ref 0.0–2.0)
Bicarbonate: 27.8 mmol/L (ref 20.0–28.0)
Calcium, Ion: 1.31 mmol/L (ref 1.15–1.40)
HCT: 32 % — ABNORMAL LOW (ref 39.0–52.0)
Hemoglobin: 10.9 g/dL — ABNORMAL LOW (ref 13.0–17.0)
O2 Saturation: 93 %
Potassium: 4.4 mmol/L (ref 3.5–5.1)
Sodium: 145 mmol/L (ref 135–145)
TCO2: 29 mmol/L (ref 22–32)
pCO2 arterial: 40.6 mmHg (ref 32.0–48.0)
pH, Arterial: 7.443 (ref 7.350–7.450)
pO2, Arterial: 66 mmHg — ABNORMAL LOW (ref 83.0–108.0)

## 2019-05-16 LAB — POCT ACTIVATED CLOTTING TIME: Activated Clotting Time: 0 seconds

## 2019-05-16 MED ORDER — FUROSEMIDE 10 MG/ML IJ SOLN
40.0000 mg | Freq: Two times a day (BID) | INTRAMUSCULAR | Status: DC
  Administered 2019-05-16 – 2019-05-23 (×15): 40 mg via INTRAVENOUS
  Filled 2019-05-16 (×14): qty 4

## 2019-05-16 MED ORDER — POTASSIUM CHLORIDE 20 MEQ/15ML (10%) PO SOLN
20.0000 meq | ORAL | Status: AC
  Administered 2019-05-16: 09:00:00 20 meq
  Filled 2019-05-16: qty 15

## 2019-05-16 NOTE — Progress Notes (Signed)
Assisted tele visit to patient with wife.  Isauro Skelley R, RN  

## 2019-05-16 NOTE — Progress Notes (Signed)
PT Cancellation Note  Patient Details Name: [name] MRN: [id number] DOB: 1960-02-20   Cancelled Treatment:    Reason Eval/Treat Not Completed: Medical issues which prohibited therapy.  Pt still not ready to mobilize at this point.   05/16/2019  Ginger Carne., PT Acute Rehabilitation Services [phone]  (pager) [phone]  (office)   Tessie Fass Jayme Cham 05/16/2019, 9:50 AM

## 2019-05-16 NOTE — Progress Notes (Signed)
Assisted tele visit to patient with wife.  Matthew Eaton, Philis Nettle, RN

## 2019-05-16 NOTE — Progress Notes (Signed)
NAME:  Matthew Eaton, MRN:  [id number], DOB:  [birth year], LOS: 8 ADMISSION DATE:  05/26/2019, CONSULTATION DATE:  05/11/2019 REFERRING MD:  [facility], CHIEF COMPLAINT:  hypoxemia   Brief History    59 year old with a 9-day history of slowly worsening Covid pneumonia despite appropriate therapy Transferred to unit, intubated 5/8  History of present illness    Patient is a 59 year old male initially admitted 3?31 to 05/22/2019 for Covid pneumonia.  He signed out AMA the evening of 05/29/2019.  Then returned the evening of 4/5 complaining of worsening shortness of breath.  Presenting saturations at that time were in the 80s and he came up into the mid 90s on 100% oxygen.  We were asked to see early this morning in regards to worsening hypoxemia.  The patient was noncompliant with his oxygen.  On high flow nasal cannula at 100% the patient had an oxygen saturation of about 79%.  With the addition of 100% facemask the patient had oxygen saturation in the mid 80s.  He was verbal throughout and did not look particularly dyspneic.  Patient was urgently transferred to the ICU and intubated.  At the time of this dictation he is ventilated on 100% with an oxygen saturation between 85 and 95%. He completed his Decadron remdesivir and Actemra.  Chest x-ray performed earlier this morning shows worsening infiltrate particularly throughout the right lung.  Past Medical History   Past Medical History:  Diagnosis Date  . Chronic neck pain 05/20/2012  . Family hx of ALS (amyotrophic lateral sclerosis) 05/27/2013  . GERD (gastroesophageal reflux disease)   . Hiatal hernia   . HLD (hyperlipidemia)   . Major depressive disorder, recurrent episode, moderate ([place]) 05/27/2013  . Neck pain    cervical spine with herniation  . Tobacco abuse   . Wrist fracture, left    Significant Hospital Events    Transferred to ICU intubation 05/11/2019 4/11-proning 4/12-tolerating supine ventilation 4/13-tolerating  PEEP wean.  Consults:  [facility], PCCM  Procedures:  Intubation 05/11/2019 Central line placement 05/11/2019  Significant Diagnostic Tests:  Covid PCR  Micro Data:  Respiratory cultures-abundant no organisms-yet to be identified  Antimicrobials:  Cefepime 4/7-4/10 Ampicillin 4/10-4/11 Augmentin4/12>> Remdesivir-completed  Interim history/subjective:  Has tolerated PEEP wean to 8.  No desaturation.  Of IV midazolam.  Objective   Blood pressure 111/67, pulse (!) 103, temperature (!) 100.9 F (38.3 C), temperature source Axillary, resp. rate (!) 28, height 5\' 10"  (1.778 m), weight 98.1 kg, SpO2 97 %. CVP:  [6 mmHg-11 mmHg] 11 mmHg  Vent Mode: PRVC FiO2 (%):  [50 %-70 %] 50 % Set Rate:  [28 bmp] 28 bmp Vt Set:  [550 mL] 550 mL PEEP:  [10 cmH20] 10 cmH20 Plateau Pressure:  [24 cmH20-25 cmH20] 25 cmH20    Intake/Output Summary (Last 24 hours) at 05/16/2019 0900 Last data filed at 05/16/2019 0800 Gross per 24 hour  Intake 3649.83 ml  Output 2685 ml  Net 964.83 ml   Filed Weights   05/14/19 0410 05/15/19 0400 05/16/19 0500  Weight: 98.7 kg 96.1 kg 98.1 kg   Examination: General: [name], chronically ill-appearing HENT: Moist mucosa Lungs: Bilateral rhonchi.  Acceptable lung mechanics. Cardiovascular: S1-2 appreciated Abdomen: Bowel sounds appreciated Extremities: No clubbing, no edema Neuro: Weakly opening eyes to voice.  Not following commands in limbs. GU: Foley catheter in place.  Resolved Hospital Problem list     Assessment & Plan:   Critically ill due to acute hypoxemic respiratory failure requiring mechanical  ventilation secondary to Covid pneumonia. Improving respiratory status. -Wean PEEP to 5 overnight. -SBT in a.m.  COVID-19 pneumonia Has completed course of remdesivir and dexamethasone.  Superimposed bacterial pneumonia -Complete 5 days of Augmentin.   Best practice:  Diet: Tube feeds Pain/Anxiety/Delirium protocol (if  indicated): Fentanyl infusion only. Fluid status goals: Gentle diuresis. VAP protocol (if indicated): In place DVT prophylaxis: Lovenox Central lines: Remove arterial line GU: Transition to condom catheter. GI prophylaxis: Protonix Glucose control: SSI Mobility: Bedrest Code Status: Full code Family Communication: Updated by RN this morning. Disposition: ICU  Labs   CBC: Recent Labs  Lab 05/12/19 0530 05/12/19 0530 05/13/19 0420 05/13/19 0926 05/14/19 0415 05/14/19 0415 05/14/19 1408 05/15/19 0402 05/15/19 0433 05/16/19 0538 05/16/19 0856  WBC 12.7*  --  14.0*  --  12.9*  --   --  11.2*  --  12.9*  --   NEUTROABS 12.1*  --  13.0*  --  10.6*  --   --  9.5*  --  11.0*  --   HGB 13.6   < > 12.9*   < > 11.3*   < > 11.2* 11.0* 10.2* 11.2* 10.9*  HCT 44.0   < > 40.8   < > 36.0*   < > 33.0* 35.7* 30.0* 36.4* 32.0*  MCV 98.2  --  97.6  --  98.1  --   --  98.9  --  100.6*  --   PLT 246  --  239  --  194  --   --  158  --  158  --    < > = values in this interval not displayed.    Basic Metabolic Panel: Recent Labs  Lab 05/10/19 0442 05/11/19 0500 05/11/19 0504 05/11/19 1023 05/11/19 1917 05/12/19 0456 05/12/19 0530 05/12/19 0530 05/12/19 2018 05/13/19 0420 05/13/19 0926 05/14/19 0415 05/14/19 0415 05/14/19 1408 05/15/19 0402 05/15/19 0433 05/16/19 0405 05/16/19 0856  NA   < >  --  138   < >  --    < > 138   < >  --  142   < > 141   < > 142 140 140 144 145  K   < >  --  3.7   < >  --    < > 4.7   < >  --  4.9   < > 4.3   < > 4.3 4.4 4.3 3.5 4.4  CL   < >  --  101  --   --   --  106  --   --  108  --  109  --   --  106  --  116*  --   CO2   < >  --  22  --   --   --  22  --   --  24  --  26  --   --  25  --  22  --   GLUCOSE   < >  --  158*  --   --   --  145*  --   --  196*  --  129*  --   --  149*  --  126*  --   BUN   < >  --  33*  --   --   --  38*  --   --  49*  --  38*  --   --  44*  --  43*  --   CREATININE   < >  --  1.89*  --   --   --  1.39*  --   --   1.30*  --  1.09  --   --  0.89  --  0.80  --   CALCIUM   < >  --  8.9  --   --   --  8.0*  --   --  8.8*  --  8.6*  --   --  8.3*  --  7.1*  --   MG  --  2.5*  --   --  2.3  --  2.6*  --  2.7* 2.8*  --   --   --   --   --   --   --   --   PHOS  --   --  6.7*  --  6.7*  --  5.0*  --  3.3 2.1*  --   --   --   --   --   --   --   --    < > = values in this interval not displayed.   GFR: Estimated Creatinine Clearance: 118.2 mL/min (by C-G formula based on SCr of 0.8 mg/dL). Recent Labs  Lab 05/11/19 0501 05/11/19 0504 05/11/19 0617 05/12/19 0530 05/13/19 0420 05/14/19 0415 05/15/19 0402 05/16/19 0538  WBC  --    < >  --    < > 14.0* 12.9* 11.2* 12.9*  LATICACIDVEN 3.1*  --  2.4*  --   --   --   --   --    < > = values in this interval not displayed.    Liver Function Tests: Recent Labs  Lab 05/10/19 0442 05/12/19 0530  AST 84* 44*  ALT 141* 106*  ALKPHOS 80 94  BILITOT 0.8 0.7  PROT 5.4* 5.0*  ALBUMIN 2.9* 2.4*   No results for input(s): LIPASE, AMYLASE in the last 168 hours. No results for input(s): AMMONIA in the last 168 hours.  ABG    Component Value Date/Time   PHART 7.443 05/16/2019 0856   PCO2ART 40.6 05/16/2019 0856   PO2ART 66.0 (L) 05/16/2019 0856   HCO3 27.8 05/16/2019 0856   TCO2 29 05/16/2019 0856   ACIDBASEDEF 5.0 (H) 05/12/2019 0456   O2SAT 93.0 05/16/2019 0856     Coagulation Profile: No results for input(s): INR, PROTIME in the last 168 hours.  Cardiac Enzymes: No results for input(s): CKTOTAL, CKMB, CKMBINDEX, TROPONINI in the last 168 hours.  HbA1C: Hemoglobin A1C  Date/Time Value Ref Range Status  01/02/2019 09:13 AM 6.1 (A) 4.0 - 5.6 % Final   Hgb A1c MFr Bld  Date/Time Value Ref Range Status  05/11/2019 10:27 AM 6.4 (H) 4.8 - 5.6 % Final    Comment:    (NOTE)         Prediabetes: 5.7 - 6.4         Diabetes: >6.4         Glycemic control for adults with diabetes: <7.0   07/01/2018 09:47 AM 6.2 (H) <5.7 % of total Hgb Final     Comment:    For someone without known diabetes, a hemoglobin  A1c value between 5.7% and 6.4% is consistent with prediabetes and should be confirmed with a  follow-up test. . For someone with known diabetes, a value <7% indicates that their diabetes is well controlled. A1c targets should be individualized based on duration of diabetes, age, comorbid conditions, and other considerations. . This assay result is consistent with an increased risk  of diabetes. . Currently, no consensus exists regarding use of hemoglobin A1c for diagnosis of diabetes for children. .     CBG: Recent Labs  Lab 05/15/19 1627 05/15/19 1942 05/15/19 2335 05/16/19 0346 05/16/19 0733  GLUCAP 127* 124* 147* 146* 138*   CRITICAL CARE Performed by: Kipp Brood   Total critical care time: 35 minutes  Critical care time was exclusive of separately billable procedures and treating other patients.  Critical care was necessary to treat or prevent imminent or life-threatening deterioration.  Critical care was time spent personally by me on the following activities: development of treatment plan with patient and/or surrogate as well as nursing, discussions with consultants, evaluation of patient's response to treatment, examination of patient, obtaining history from patient or surrogate, ordering and performing treatments and interventions, ordering and review of laboratory studies, ordering and review of radiographic studies, pulse oximetry, re-evaluation of patient's condition and participation in multidisciplinary rounds.  Kipp Brood, MD [facility] ICU Physician [place]  Pager: [phone] Mobile: [phone] After hours: [phone].

## 2019-05-16 NOTE — Progress Notes (Signed)
Spoke with wife on phone. Updated on patient status and plan of care. Will set up video conference for this morning.

## 2019-05-16 NOTE — Progress Notes (Signed)
[facility] ADULT ICU REPLACEMENT PROTOCOL FOR AM LAB REPLACEMENT ONLY  The patient does apply for the [facility] Adult ICU Electrolyte Replacment Protocol based on the criteria listed below:   1. Is GFR >/= 40 ml/min? Yes.    Patient's GFR today is >60 2. Is urine output >/= 0.5 ml/kg/hr for the last 6 hours? Yes.   Patient's UOP is 1.4 ml/kg/hr 3. Is BUN < 60 mg/dL? Yes.    Patient's BUN today is 43 4. Abnormal electrolyte(s): K+ 3.5 5. Ordered repletion with: protocol 6. If a panic level lab has been reported, has the CCM MD in charge been notified? Yes.  .   Physician:  Dr. Radene Knee, [place] Grumbling 05/16/2019 6:14 AM

## 2019-05-17 ENCOUNTER — Inpatient Hospital Stay (HOSPITAL_COMMUNITY): Payer: PPO

## 2019-05-17 DIAGNOSIS — U071 COVID-19: Secondary | ICD-10-CM | POA: Diagnosis not present

## 2019-05-17 DIAGNOSIS — J9601 Acute respiratory failure with hypoxia: Secondary | ICD-10-CM | POA: Diagnosis not present

## 2019-05-17 LAB — CBC WITH DIFFERENTIAL/PLATELET
Abs Immature Granulocytes: 0.15 10*3/uL — ABNORMAL HIGH (ref 0.00–0.07)
Basophils Absolute: 0 10*3/uL (ref 0.0–0.1)
Basophils Relative: 0 %
Eosinophils Absolute: 0.1 10*3/uL (ref 0.0–0.5)
Eosinophils Relative: 0 %
HCT: 39.1 % (ref 39.0–52.0)
Hemoglobin: 11.9 g/dL — ABNORMAL LOW (ref 13.0–17.0)
Immature Granulocytes: 1 %
Lymphocytes Relative: 4 %
Lymphs Abs: 0.7 10*3/uL (ref 0.7–4.0)
MCH: 31 pg (ref 26.0–34.0)
MCHC: 30.4 g/dL (ref 30.0–36.0)
MCV: 101.8 fL — ABNORMAL HIGH (ref 80.0–100.0)
Monocytes Absolute: 0.6 10*3/uL (ref 0.1–1.0)
Monocytes Relative: 4 %
Neutro Abs: 13.6 10*3/uL — ABNORMAL HIGH (ref 1.7–7.7)
Neutrophils Relative %: 91 %
Platelets: 165 10*3/uL (ref 150–400)
RBC: 3.84 MIL/uL — ABNORMAL LOW (ref 4.22–5.81)
RDW: 16.4 % — ABNORMAL HIGH (ref 11.5–15.5)
WBC: 15.1 10*3/uL — ABNORMAL HIGH (ref 4.0–10.5)
nRBC: 0 % (ref 0.0–0.2)

## 2019-05-17 LAB — POCT I-STAT 7, (LYTES, BLD GAS, ICA,H+H)
Acid-Base Excess: 3 mmol/L — ABNORMAL HIGH (ref 0.0–2.0)
Bicarbonate: 27.4 mmol/L (ref 20.0–28.0)
Calcium, Ion: 1.3 mmol/L (ref 1.15–1.40)
HCT: 33 % — ABNORMAL LOW (ref 39.0–52.0)
Hemoglobin: 11.2 g/dL — ABNORMAL LOW (ref 13.0–17.0)
O2 Saturation: 93 %
Patient temperature: 101
Potassium: 4.2 mmol/L (ref 3.5–5.1)
Sodium: 148 mmol/L — ABNORMAL HIGH (ref 135–145)
TCO2: 28 mmol/L (ref 22–32)
pCO2 arterial: 40.3 mmHg (ref 32.0–48.0)
pH, Arterial: 7.445 (ref 7.350–7.450)
pO2, Arterial: 69 mmHg — ABNORMAL LOW (ref 83.0–108.0)

## 2019-05-17 LAB — BASIC METABOLIC PANEL
Anion gap: 10 (ref 5–15)
BUN: 47 mg/dL — ABNORMAL HIGH (ref 6–20)
CO2: 28 mmol/L (ref 22–32)
Calcium: 9 mg/dL (ref 8.9–10.3)
Chloride: 111 mmol/L (ref 98–111)
Creatinine, Ser: 1.15 mg/dL (ref 0.61–1.24)
GFR calc Af Amer: >60 mL/min (ref >60)
GFR calc non Af Amer: >60 mL/min (ref >60)
Glucose, Bld: 186 mg/dL — ABNORMAL HIGH (ref 70–99)
Potassium: 4.3 mmol/L (ref 3.5–5.1)
Sodium: 149 mmol/L — ABNORMAL HIGH (ref 135–145)

## 2019-05-17 LAB — GLUCOSE, CAPILLARY
Glucose-Capillary: 160 mg/dL — ABNORMAL HIGH (ref 70–99)
Glucose-Capillary: 177 mg/dL — ABNORMAL HIGH (ref 70–99)
Glucose-Capillary: 158 mg/dL — ABNORMAL HIGH (ref 70–99)
Glucose-Capillary: 173 mg/dL — ABNORMAL HIGH (ref 70–99)
Glucose-Capillary: 143 mg/dL — ABNORMAL HIGH (ref 70–99)
Glucose-Capillary: 182 mg/dL — ABNORMAL HIGH (ref 70–99)
Glucose-Capillary: 190 mg/dL — ABNORMAL HIGH (ref 70–99)

## 2019-05-17 MED ORDER — FENTANYL CITRATE (PF) 100 MCG/2ML IJ SOLN
50.0000 ug | INTRAMUSCULAR | Status: DC | PRN
  Administered 2019-05-17 – 2019-05-26 (×31): 50 ug via INTRAVENOUS
  Filled 2019-05-17 (×25): qty 2

## 2019-05-17 MED ORDER — PANTOPRAZOLE SODIUM 40 MG PO PACK
40.0000 mg | PACK | Freq: Every day | ORAL | Status: DC
  Administered 2019-05-17 – 2019-06-05 (×20): 40 mg
  Filled 2019-05-17 (×20): qty 20

## 2019-05-17 NOTE — Progress Notes (Signed)
PT Cancellation Note  Patient Details Name: Matthew Eaton MRN: [id number] DOB: 22-Aug-1960   Cancelled Treatment:    Reason Eval/Treat Not Completed: Medical issues which prohibited therapy.  Pt still not able to participate in mobility.  Will see as able. 05/17/2019  Ginger Carne., PT Acute Rehabilitation Services [phone]  (pager) [phone]  (office)   Tessie Fass Laquilla Dault 05/17/2019, 10:04 AM

## 2019-05-17 NOTE — Progress Notes (Signed)
[facility] Progress Note Patient Name: Trenton Choksi DOB: 1960/06/10 MRN: [id number]   Date of Service  05/17/2019  HPI/Events of Note  Request for AM lab orders.   eICU Interventions  Will order: 1. CBC with platelets, BMP, ABG and portable CXR at 5 AM.      Intervention Category Major Interventions: Other:  Lysle Dingwall 05/17/2019, 4:05 AM

## 2019-05-17 NOTE — Progress Notes (Signed)
NAME:  Matthew Eaton, MRN:  [id number], DOB:  18-Sep-1960, LOS: 9 ADMISSION DATE:  05/14/2019, CONSULTATION DATE:  05/11/2019 REFERRING MD:  [facility], CHIEF COMPLAINT:  hypoxemia   Brief History    59 year old with a 9-day history of slowly worsening Covid pneumonia despite appropriate therapy Transferred to unit, intubated 5/8  History of present illness    Patient is a 59 year old male initially admitted 3?31 to 05/09/2019 for Covid pneumonia.  He signed out AMA the evening of 05/10/2019.  Then returned the evening of 4/5 complaining of worsening shortness of breath.  Presenting saturations at that time were in the 80s and he came up into the mid 90s on 100% oxygen.  We were asked to see early this morning in regards to worsening hypoxemia.  The patient was noncompliant with his oxygen.  On high flow nasal cannula at 100% the patient had an oxygen saturation of about 79%.  With the addition of 100% facemask the patient had oxygen saturation in the mid 80s.  He was verbal throughout and did not look particularly dyspneic.  Patient was urgently transferred to the ICU and intubated.  At the time of this dictation he is ventilated on 100% with an oxygen saturation between 85 and 95%. He completed his Decadron remdesivir and Actemra.  Chest x-ray performed earlier this morning shows worsening infiltrate particularly throughout the right lung.  Past Medical History   Past Medical History:  Diagnosis Date  . Chronic neck pain 05/20/2012  . Family hx of ALS (amyotrophic lateral sclerosis) 05/27/2013  . GERD (gastroesophageal reflux disease)   . Hiatal hernia   . HLD (hyperlipidemia)   . Major depressive disorder, recurrent episode, moderate ([place]) 05/27/2013  . Neck pain    cervical spine with herniation  . Tobacco abuse   . Wrist fracture, left    Significant Hospital Events    Transferred to ICU intubation 05/11/2019 4/11-proning 4/12-tolerating supine ventilation 4/13-tolerating  PEEP wean.  Consults:  [facility], PCCM  Procedures:  Intubation 05/11/2019 Central line placement 05/11/2019  Significant Diagnostic Tests:  Covid PCR  Micro Data:  Respiratory cultures-abundant no organisms-yet to be identified  Antimicrobials:  Cefepime 4/7-4/10 Ampicillin 4/10-4/11 Augmentin4/12>> Remdesivir-completed  Interim history/subjective:  Tolerating SBT this morning but has not followed commands.  Objective   Blood pressure 131/75, pulse (!) 122, temperature (!) 103.3 F (39.6 C), temperature source Oral, resp. rate (!) 25, height 5\' 10"  (1.778 m), weight 99.2 kg, SpO2 95 %. CVP:  [7 mmHg-16 mmHg] 9 mmHg  Vent Mode: PRVC FiO2 (%):  [50 %] 50 % Set Rate:  [28 bmp] 28 bmp Vt Set:  [550 mL] 550 mL PEEP:  [8 cmH20-10 cmH20] 8 cmH20 Plateau Pressure:  [20 cmH20-25 cmH20] 23 cmH20    Intake/Output Summary (Last 24 hours) at 05/17/2019 0856 Last data filed at 05/17/2019 0600 Gross per 24 hour  Intake 1384.47 ml  Output 1840 ml  Net -455.53 ml   Filed Weights   05/15/19 0400 05/16/19 0500 05/17/19 0400  Weight: 96.1 kg 98.1 kg 99.2 kg   Examination: General: [name], chronically ill-appearing, average build. HENT: Moist mucosa Lungs: chest clear.  Cardiovascular: normal S1S2 Abdomen: Bowel sounds appreciated Extremities: No clubbing, trace edema Neuro: Weakly opening eyes to voice.  Not following commands in limbs. GU: Foley catheter in place.  Resolved Hospital Problem list     Assessment & Plan:   Critically ill due to acute hypoxemic respiratory failure requiring mechanical ventilation secondary to Covid pneumonia.  Improving respiratory status. -SBT today. - Stop all continuous sedatives  - Diurese again today  COVID-19 pneumonia Has completed course of remdesivir and dexamethasone.  Superimposed bacterial pneumonia -Complete 5 days of Augmentin.   Best practice:  Diet: Tube feeds Pain/Anxiety/Delirium protocol (if  indicated): intermittent sedatives only.  Fluid status goals: Gentle diuresis. VAP protocol (if indicated): In place DVT prophylaxis: Lovenox Central lines: Remove arterial line GU: Transition to condom catheter. GI prophylaxis: Protonix Glucose control: SSI Mobility: Bedrest Code Status: Full code Family Communication: Updated by RN this morning. Disposition: ICU  Labs   CBC: Recent Labs  Lab 05/13/19 0420 05/13/19 0926 05/14/19 0415 05/14/19 1408 05/15/19 0402 05/15/19 0402 05/15/19 0433 05/16/19 0538 05/16/19 0856 05/17/19 0428 05/17/19 0508  WBC 14.0*  --  12.9*  --  11.2*  --   --  12.9*  --   --  15.1*  NEUTROABS 13.0*  --  10.6*  --  9.5*  --   --  11.0*  --   --  13.6*  HGB 12.9*   < > 11.3*   < > 11.0*   < > 10.2* 11.2* 10.9* 11.2* 11.9*  HCT 40.8   < > 36.0*   < > 35.7*   < > 30.0* 36.4* 32.0* 33.0* 39.1  MCV 97.6  --  98.1  --  98.9  --   --  100.6*  --   --  101.8*  PLT 239  --  194  --  158  --   --  158  --   --  165   < > = values in this interval not displayed.    Basic Metabolic Panel: Recent Labs  Lab 05/11/19 0423 05/11/19 0500 05/11/19 0504 05/11/19 1023 05/11/19 1917 05/12/19 0456 05/12/19 0530 05/12/19 0530 05/12/19 2018 05/13/19 0420 05/13/19 0926 05/14/19 0415 05/14/19 1408 05/15/19 0402 05/15/19 0402 05/15/19 0433 05/16/19 0405 05/16/19 0856 05/17/19 0428 05/17/19 0508  NA   < >  --  138   < >  --    < > 138   < >  --  142   < > 141   < > 140   < > 140 144 145 148* 149*  K   < >  --  3.7   < >  --    < > 4.7   < >  --  4.9   < > 4.3   < > 4.4   < > 4.3 3.5 4.4 4.2 4.3  CL   < >  --  101  --   --   --  106   < >  --  108  --  109  --  106  --   --  116*  --   --  111  CO2   < >  --  22  --   --   --  22   < >  --  24  --  26  --  25  --   --  22  --   --  28  GLUCOSE   < >  --  158*  --   --   --  145*   < >  --  196*  --  129*  --  149*  --   --  126*  --   --  186*  BUN   < >  --  33*  --   --   --  38*   < >  --  49*  --  38*   --  44*  --   --  43*  --   --  47*  CREATININE   < >  --  1.89*  --   --   --  1.39*   < >  --  1.30*  --  1.09  --  0.89  --   --  0.80  --   --  1.15  CALCIUM   < >  --  8.9  --   --   --  8.0*   < >  --  8.8*  --  8.6*  --  8.3*  --   --  7.1*  --   --  9.0  MG  --  2.5*  --   --  2.3  --  2.6*  --  2.7* 2.8*  --   --   --   --   --   --   --   --   --   --   PHOS  --   --  6.7*  --  6.7*  --  5.0*  --  3.3 2.1*  --   --   --   --   --   --   --   --   --   --    < > = values in this interval not displayed.   GFR: Estimated Creatinine Clearance: 82.7 mL/min (by C-G formula based on SCr of 1.15 mg/dL). Recent Labs  Lab 05/11/19 0501 05/11/19 0504 05/11/19 0617 05/12/19 0530 05/14/19 0415 05/15/19 0402 05/16/19 0538 05/17/19 0508  WBC  --    < >  --    < > 12.9* 11.2* 12.9* 15.1*  LATICACIDVEN 3.1*  --  2.4*  --   --   --   --   --    < > = values in this interval not displayed.    Liver Function Tests: Recent Labs  Lab 05/12/19 0530  AST 44*  ALT 106*  ALKPHOS 94  BILITOT 0.7  PROT 5.0*  ALBUMIN 2.4*   No results for input(s): LIPASE, AMYLASE in the last 168 hours. No results for input(s): AMMONIA in the last 168 hours.  ABG    Component Value Date/Time   PHART 7.445 05/17/2019 0428   PCO2ART 40.3 05/17/2019 0428   PO2ART 69.0 (L) 05/17/2019 0428   HCO3 27.4 05/17/2019 0428   TCO2 28 05/17/2019 0428   ACIDBASEDEF 5.0 (H) 05/12/2019 0456   O2SAT 93.0 05/17/2019 0428     Coagulation Profile: No results for input(s): INR, PROTIME in the last 168 hours.  Cardiac Enzymes: No results for input(s): CKTOTAL, CKMB, CKMBINDEX, TROPONINI in the last 168 hours.  HbA1C: Hemoglobin A1C  Date/Time Value Ref Range Status  01/02/2019 09:13 AM 6.1 (A) 4.0 - 5.6 % Final   Hgb A1c MFr Bld  Date/Time Value Ref Range Status  05/11/2019 10:27 AM 6.4 (H) 4.8 - 5.6 % Final    Comment:    (NOTE)         Prediabetes: 5.7 - 6.4         Diabetes: >6.4         Glycemic  control for adults with diabetes: <7.0   07/01/2018 09:47 AM 6.2 (H) <5.7 % of total Hgb Final    Comment:    For someone without known diabetes, a hemoglobin  A1c value between 5.7% and 6.4% is consistent with prediabetes and should be confirmed with  a  follow-up test. . For someone with known diabetes, a value <7% indicates that their diabetes is well controlled. A1c targets should be individualized based on duration of diabetes, age, comorbid conditions, and other considerations. . This assay result is consistent with an increased risk of diabetes. . Currently, no consensus exists regarding use of hemoglobin A1c for diagnosis of diabetes for children. .     CBG: Recent Labs  Lab 05/16/19 1621 05/16/19 1950 05/16/19 2356 05/17/19 0403 05/17/19 0805  GLUCAP 143* 115* 163* 160* 177*   CRITICAL CARE Performed by: Kipp Brood   Total critical care time: 35 minutes  Critical care time was exclusive of separately billable procedures and treating other patients.  Critical care was necessary to treat or prevent imminent or life-threatening deterioration.  Critical care was time spent personally by me on the following activities: development of treatment plan with patient and/or surrogate as well as nursing, discussions with consultants, evaluation of patient's response to treatment, examination of patient, obtaining history from patient or surrogate, ordering and performing treatments and interventions, ordering and review of laboratory studies, ordering and review of radiographic studies, pulse oximetry, re-evaluation of patient's condition and participation in multidisciplinary rounds.  Kipp Brood, MD [facility] ICU Physician [place]  Pager: [phone] Mobile: [phone] After hours: [phone].

## 2019-05-17 NOTE — Progress Notes (Signed)
Assisted tele visit to patient with wife.  Matthew Eaton, Philis Nettle, RN

## 2019-05-17 NOTE — Progress Notes (Signed)
Assisted tele visit to patient with wife.  Celester Morgan Anderson, RN   

## 2019-05-17 NOTE — Progress Notes (Signed)
eLink Physician-Brief Progress Note Patient Name: Matthew Eaton DOB: 27-Aug-1960 MRN: [id number]   Date of Service  05/17/2019  HPI/Events of Note  Agitation - Request to change Fentanyl from bolus via infusion to IV PRN.   eICU Interventions  Will order: 1. D/C Fentanyl bolus via infusion. 2. Fentanyl 50 mcg IV Q 1 hour PRN sedation.     Intervention Category Major Interventions: Delirium, psychosis, severe agitation - evaluation and management  Lysle Dingwall 05/17/2019, 7:44 PM

## 2019-05-18 ENCOUNTER — Inpatient Hospital Stay (HOSPITAL_COMMUNITY): Payer: PPO

## 2019-05-18 DIAGNOSIS — J9601 Acute respiratory failure with hypoxia: Secondary | ICD-10-CM | POA: Diagnosis not present

## 2019-05-18 DIAGNOSIS — U071 COVID-19: Secondary | ICD-10-CM | POA: Diagnosis not present

## 2019-05-18 LAB — CBC WITH DIFFERENTIAL/PLATELET
Abs Immature Granulocytes: 0.12 10*3/uL — ABNORMAL HIGH (ref 0.00–0.07)
Basophils Absolute: 0.1 10*3/uL (ref 0.0–0.1)
Basophils Relative: 0 %
Eosinophils Absolute: 0.1 10*3/uL (ref 0.0–0.5)
Eosinophils Relative: 1 %
HCT: 39.1 % (ref 39.0–52.0)
Hemoglobin: 11.9 g/dL — ABNORMAL LOW (ref 13.0–17.0)
Immature Granulocytes: 1 %
Lymphocytes Relative: 10 %
Lymphs Abs: 1.3 10*3/uL (ref 0.7–4.0)
MCH: 30.7 pg (ref 26.0–34.0)
MCHC: 30.4 g/dL (ref 30.0–36.0)
MCV: 101 fL — ABNORMAL HIGH (ref 80.0–100.0)
Monocytes Absolute: 0.9 10*3/uL (ref 0.1–1.0)
Monocytes Relative: 7 %
Neutro Abs: 10.9 10*3/uL — ABNORMAL HIGH (ref 1.7–7.7)
Neutrophils Relative %: 81 %
Platelets: 168 10*3/uL (ref 150–400)
RBC: 3.87 MIL/uL — ABNORMAL LOW (ref 4.22–5.81)
RDW: 16.4 % — ABNORMAL HIGH (ref 11.5–15.5)
WBC: 13.3 10*3/uL — ABNORMAL HIGH (ref 4.0–10.5)
nRBC: 0.2 % (ref 0.0–0.2)

## 2019-05-18 LAB — BASIC METABOLIC PANEL
Anion gap: 13 (ref 5–15)
BUN: 38 mg/dL — ABNORMAL HIGH (ref 6–20)
CO2: 28 mmol/L (ref 22–32)
Calcium: 8.9 mg/dL (ref 8.9–10.3)
Chloride: 109 mmol/L (ref 98–111)
Creatinine, Ser: 1.02 mg/dL (ref 0.61–1.24)
GFR calc Af Amer: >60 mL/min (ref >60)
GFR calc non Af Amer: >60 mL/min (ref >60)
Glucose, Bld: 146 mg/dL — ABNORMAL HIGH (ref 70–99)
Potassium: 3.4 mmol/L — ABNORMAL LOW (ref 3.5–5.1)
Sodium: 150 mmol/L — ABNORMAL HIGH (ref 135–145)

## 2019-05-18 LAB — GLUCOSE, CAPILLARY
Glucose-Capillary: 152 mg/dL — ABNORMAL HIGH (ref 70–99)
Glucose-Capillary: 141 mg/dL — ABNORMAL HIGH (ref 70–99)
Glucose-Capillary: 156 mg/dL — ABNORMAL HIGH (ref 70–99)
Glucose-Capillary: 160 mg/dL — ABNORMAL HIGH (ref 70–99)
Glucose-Capillary: 160 mg/dL — ABNORMAL HIGH (ref 70–99)
Glucose-Capillary: 163 mg/dL — ABNORMAL HIGH (ref 70–99)

## 2019-05-18 LAB — TRIGLYCERIDES: Triglycerides: 267 mg/dL — ABNORMAL HIGH (ref <150)

## 2019-05-18 MED ORDER — ALBUTEROL SULFATE (2.5 MG/3ML) 0.083% IN NEBU
INHALATION_SOLUTION | RESPIRATORY_TRACT | Status: AC
  Filled 2019-05-18: qty 3

## 2019-05-18 MED ORDER — PRO-STAT SUGAR FREE PO LIQD
30.0000 mL | Freq: Four times a day (QID) | ORAL | Status: DC
  Administered 2019-05-18 – 2019-06-05 (×74): 30 mL
  Filled 2019-05-18 (×74): qty 30

## 2019-05-18 MED ORDER — PROPOFOL 1000 MG/100ML IV EMUL
5.0000 ug/kg/min | INTRAVENOUS | Status: AC
  Administered 2019-05-18: 10 ug/kg/min via INTRAVENOUS
  Administered 2019-05-19: 5 ug/kg/min via INTRAVENOUS
  Administered 2019-05-20: 30 ug/kg/min via INTRAVENOUS
  Administered 2019-05-20 (×2): 20 ug/kg/min via INTRAVENOUS
  Administered 2019-05-21: 25 ug/kg/min via INTRAVENOUS
  Administered 2019-05-21: 18 ug/kg/min via INTRAVENOUS
  Administered 2019-05-21: 30 ug/kg/min via INTRAVENOUS
  Administered 2019-05-22 (×2): 15 ug/kg/min via INTRAVENOUS
  Administered 2019-05-22 – 2019-05-23 (×2): 22 ug/kg/min via INTRAVENOUS
  Filled 2019-05-18 (×13): qty 100

## 2019-05-18 MED ORDER — ALBUTEROL SULFATE (2.5 MG/3ML) 0.083% IN NEBU
2.5000 mg | INHALATION_SOLUTION | RESPIRATORY_TRACT | Status: DC | PRN
  Administered 2019-05-18 – 2019-05-23 (×3): 2.5 mg via RESPIRATORY_TRACT
  Filled 2019-05-18 (×2): qty 3

## 2019-05-18 MED ORDER — ACETYLCYSTEINE 20 % IN SOLN
2.0000 mL | RESPIRATORY_TRACT | Status: DC
  Administered 2019-05-18 – 2019-05-19 (×5): 2 mL via RESPIRATORY_TRACT
  Filled 2019-05-18 (×5): qty 4

## 2019-05-18 MED ORDER — DEXMEDETOMIDINE HCL IN NACL 400 MCG/100ML IV SOLN
0.4000 ug/kg/h | INTRAVENOUS | Status: DC
  Administered 2019-05-18: 0.4 ug/kg/h via INTRAVENOUS
  Administered 2019-05-18: 0.5 ug/kg/h via INTRAVENOUS
  Administered 2019-05-19: 0.6 ug/kg/h via INTRAVENOUS
  Administered 2019-05-19: 1 ug/kg/h via INTRAVENOUS
  Administered 2019-05-19: 1.2 ug/kg/h via INTRAVENOUS
  Administered 2019-05-19: 0.8 ug/kg/h via INTRAVENOUS
  Administered 2019-05-19: 1 ug/kg/h via INTRAVENOUS
  Administered 2019-05-20: 0.8 ug/kg/h via INTRAVENOUS
  Administered 2019-05-20: 0.9 ug/kg/h via INTRAVENOUS
  Administered 2019-05-20: 0.8 ug/kg/h via INTRAVENOUS
  Administered 2019-05-20: 1 ug/kg/h via INTRAVENOUS
  Administered 2019-05-21: 0.9 ug/kg/h via INTRAVENOUS
  Administered 2019-05-21: 0.859 ug/kg/h via INTRAVENOUS
  Administered 2019-05-21 (×2): 0.8 ug/kg/h via INTRAVENOUS
  Administered 2019-05-21: 0.9 ug/kg/h via INTRAVENOUS
  Administered 2019-05-22: 1 ug/kg/h via INTRAVENOUS
  Administered 2019-05-22 (×2): 0.9 ug/kg/h via INTRAVENOUS
  Administered 2019-05-22 – 2019-05-23 (×3): 1 ug/kg/h via INTRAVENOUS
  Administered 2019-05-23: 1.2 ug/kg/h via INTRAVENOUS
  Administered 2019-05-23: 1 ug/kg/h via INTRAVENOUS
  Administered 2019-05-23 – 2019-05-24 (×10): 1.2 ug/kg/h via INTRAVENOUS
  Administered 2019-05-25: 1.1 ug/kg/h via INTRAVENOUS
  Administered 2019-05-25: 1.2 ug/kg/h via INTRAVENOUS
  Administered 2019-05-25 (×3): 1.1 ug/kg/h via INTRAVENOUS
  Administered 2019-05-25: 1.2 ug/kg/h via INTRAVENOUS
  Filled 2019-05-18 (×11): qty 100
  Filled 2019-05-18 (×2): qty 200
  Filled 2019-05-18 (×26): qty 100

## 2019-05-18 MED ORDER — QUETIAPINE FUMARATE 50 MG PO TABS
50.0000 mg | ORAL_TABLET | Freq: Every day | ORAL | Status: DC
  Administered 2019-05-18 – 2019-05-20 (×3): 50 mg via ORAL
  Filled 2019-05-18 (×3): qty 1

## 2019-05-18 MED ORDER — ALBUTEROL SULFATE HFA 108 (90 BASE) MCG/ACT IN AERS
2.0000 | INHALATION_SPRAY | RESPIRATORY_TRACT | Status: DC | PRN
  Filled 2019-05-18: qty 6.7

## 2019-05-18 MED ORDER — ACETYLCYSTEINE 10 % IN SOLN
2.0000 mL | RESPIRATORY_TRACT | Status: DC
  Filled 2019-05-18 (×4): qty 2

## 2019-05-18 MED ORDER — FREE WATER
400.0000 mL | Status: DC
  Administered 2019-05-18 – 2019-05-22 (×23): 400 mL

## 2019-05-18 MED ORDER — CHOLESTYRAMINE 4 G PO PACK
4.0000 g | PACK | Freq: Two times a day (BID) | ORAL | Status: DC
  Administered 2019-05-18 – 2019-05-21 (×7): 4 g via ORAL
  Filled 2019-05-18 (×7): qty 1

## 2019-05-18 MED ORDER — VITAL 1.5 CAL PO LIQD
1000.0000 mL | ORAL | Status: DC
  Administered 2019-05-18 – 2019-06-05 (×22): 1000 mL
  Filled 2019-05-18 (×23): qty 1000

## 2019-05-18 NOTE — Progress Notes (Signed)
Tele visit with wife in progress.

## 2019-05-18 NOTE — Progress Notes (Signed)
NAME:  Matthew Eaton, MRN:  [id number], DOB:  [birth year], LOS: 16 ADMISSION DATE:  06/01/2019, CONSULTATION DATE:  05/11/2019 REFERRING MD:  [facility], CHIEF COMPLAINT:  hypoxemia   Brief History    59 year old with a 9-day history of slowly worsening Covid pneumonia despite appropriate therapy Transferred to unit, intubated 5/8  History of present illness    Patient is a 59 year old male initially admitted 3?31 to 05/11/2019 for Covid pneumonia.  He signed out AMA the evening of 05/25/2019.  Then returned the evening of 4/5 complaining of worsening shortness of breath.  Presenting saturations at that time were in the 80s and he came up into the mid 90s on 100% oxygen.  We were asked to see early this morning in regards to worsening hypoxemia.  The patient was noncompliant with his oxygen.  On high flow nasal cannula at 100% the patient had an oxygen saturation of about 79%.  With the addition of 100% facemask the patient had oxygen saturation in the mid 80s.  He was verbal throughout and did not look particularly dyspneic.  Patient was urgently transferred to the ICU and intubated.  At the time of this dictation he is ventilated on 100% with an oxygen saturation between 85 and 95%. He completed his Decadron remdesivir and Actemra.  Chest x-ray performed earlier this morning shows worsening infiltrate particularly throughout the right lung.  Past Medical History   Past Medical History:  Diagnosis Date  . Chronic neck pain 05/20/2012  . Family hx of ALS (amyotrophic lateral sclerosis) 05/27/2013  . GERD (gastroesophageal reflux disease)   . Hiatal hernia   . HLD (hyperlipidemia)   . Major depressive disorder, recurrent episode, moderate ([place]) 05/27/2013  . Neck pain    cervical spine with herniation  . Tobacco abuse   . Wrist fracture, left    Significant Hospital Events    Transferred to ICU intubation 05/11/2019 4/11-proning 4/12-tolerating supine ventilation 4/13-tolerating  PEEP wean.  Consults:  [facility], PCCM  Procedures:  Intubation 05/11/2019 Central line placement 05/11/2019  Significant Diagnostic Tests:  Chest x-ray 4/15 bilateral interstitial infiltrates left> right.  Progressive clearing of left midlung field opacification.  Micro Data:  Respiratory cultures-positive WBC, abundant Staph aureus, Haemophilus influenza Covid PCR positive  Antimicrobials:  Cefepime 4/7-4/10 Ampicillin 4/10-4/11 Augmentin4/12>> Remdesivir-completed  Interim history/subjective:  Tolerating SBT this morning, now following commands.  Periods of increased agitation.  Oxygenation has deteriorated overnight.  Objective   Blood pressure (!) 146/93, pulse (!) 112, temperature 99.4 F (37.4 C), temperature source Oral, resp. rate (!) 23, height 5\' 10"  (1.778 m), weight 93.1 kg, SpO2 (!) 89 %.    Vent Mode: CPAP;PSV FiO2 (%):  [50 %-80 %] 80 % Set Rate:  [28 bmp] 28 bmp Vt Set:  [550 mL] 550 mL PEEP:  [8 cmH20] 8 cmH20 Pressure Support:  [8 cmH20-10 cmH20] 10 cmH20 Plateau Pressure:  [20 cmH20-24 cmH20] 23 cmH20    Intake/Output Summary (Last 24 hours) at 05/18/2019 0924 Last data filed at 05/18/2019 0800 Gross per 24 hour  Intake 2248.61 ml  Output 4000 ml  Net -1751.39 ml   Filed Weights   05/16/19 0500 05/17/19 0400 05/18/19 0347  Weight: 98.1 kg 99.2 kg 93.1 kg   Examination: General: [name], chronically ill-appearing, average build. HENT: Moist mucosa Lungs: Diffuse rhonchi. Cardiovascular: normal S1S2 Abdomen: Bowel sounds appreciated Extremities: No clubbing, trace edema Neuro: Weakly opening eyes to voice.  Not moving all 4 limbs weakly. GU: Foley catheter in  place.  Resolved Hospital Problem list     Assessment & Plan:   Critically ill due to acute hypoxemic respiratory failure requiring mechanical ventilation secondary to Covid pneumonia. Tolerating SBT but some worsening oxygenation.  Perhaps related to increased  agitation.  May benefit from enhance secretion clearance. -SBT today. -Keep off all sedatives -Continue diuresis -Chest physiotherapy with mucolytic.  COVID-19 pneumonia Has completed course of remdesivir and dexamethasone.  Superimposed bacterial pneumonia.  Continues to have fever and leukocytosis in spite of appropriate antibiotics. -Complete 5 days of Augmentin. -Continue to follow fever and leukocytosis.  No change in antibiotics so long as patient continues to wean improve neurologically.   Best practice:  Diet: Tube feeds Pain/Anxiety/Delirium protocol (if indicated): intermittent sedatives only.  Fluid status goals: Gentle diuresis. VAP protocol (if indicated): In place DVT prophylaxis: Lovenox Central lines: Remove arterial line GU: Transition to condom catheter. GI prophylaxis: Protonix Glucose control: SSI Mobility: Bedrest Code Status: Full code Family Communication: Updated by RN this morning. Disposition: ICU  Labs   CBC: Recent Labs  Lab 05/14/19 0415 05/14/19 1408 05/15/19 0402 05/15/19 [id number] 05/16/19 0538 05/16/19 0856 05/17/19 0428 05/17/19 0508 05/18/19 0702  WBC 12.9*  --  11.2*  --  12.9*  --   --  15.1* 13.3*  NEUTROABS 10.6*  --  9.5*  --  11.0*  --   --  13.6* 10.9*  HGB 11.3*   < > 11.0*   < > 11.2* 10.9* 11.2* 11.9* 11.9*  HCT 36.0*   < > 35.7*   < > 36.4* 32.0* 33.0* 39.1 39.1  MCV 98.1  --  98.9  --  100.6*  --   --  101.8* 101.0*  PLT 194  --  158  --  158  --   --  165 168   < > = values in this interval not displayed.    Basic Metabolic Panel: Recent Labs  Lab 05/11/19 1917 05/12/19 0456 05/12/19 0530 05/12/19 0530 05/12/19 2018 05/13/19 0420 05/13/19 [id number] 05/14/19 0415 05/14/19 1408 05/15/19 0402 05/15/19 [id number] 05/16/19 0405 05/16/19 0856 05/17/19 0428 05/17/19 0508 05/18/19 0702  NA  --    < > 138   < >  --  142   < > 141   < > 140   < > 144 145 148* 149* 150*  K  --    < > 4.7   < >  --  4.9   < > 4.3   < > 4.4   <  > 3.5 4.4 4.2 4.3 3.4*  CL  --   --  106   < >  --  108  --  109  --  106  --  116*  --   --  111 109  CO2  --   --  22   < >  --  24  --  26  --  25  --  22  --   --  28 28  GLUCOSE  --   --  145*   < >  --  196*  --  129*  --  149*  --  126*  --   --  186* 146*  BUN  --   --  38*   < >  --  49*  --  38*  --  44*  --  43*  --   --  47* 38*  CREATININE  --   --  1.39*   < >  --  1.30*  --  1.09  --  0.89  --  0.80  --   --  1.15 1.02  CALCIUM  --   --  8.0*   < >  --  8.8*  --  8.6*  --  8.3*  --  7.1*  --   --  9.0 8.9  MG 2.3  --  2.6*  --  2.7* 2.8*  --   --   --   --   --   --   --   --   --   --   PHOS 6.7*  --  5.0*  --  3.3 2.1*  --   --   --   --   --   --   --   --   --   --    < > = values in this interval not displayed.   GFR: Estimated Creatinine Clearance: 90.4 mL/min (by C-G formula based on SCr of 1.02 mg/dL). Recent Labs  Lab 05/15/19 0402 05/16/19 0538 05/17/19 0508 05/18/19 0702  WBC 11.2* 12.9* 15.1* 13.3*    Liver Function Tests: Recent Labs  Lab 05/12/19 0530  AST 44*  ALT 106*  ALKPHOS 94  BILITOT 0.7  PROT 5.0*  ALBUMIN 2.4*   No results for input(s): LIPASE, AMYLASE in the last 168 hours. No results for input(s): AMMONIA in the last 168 hours.  ABG    Component Value Date/Time   PHART 7.445 05/17/2019 0428   PCO2ART 40.3 05/17/2019 0428   PO2ART 69.0 (L) 05/17/2019 0428   HCO3 27.4 05/17/2019 0428   TCO2 28 05/17/2019 0428   ACIDBASEDEF 5.0 (H) 05/12/2019 0456   O2SAT 93.0 05/17/2019 0428     Coagulation Profile: No results for input(s): INR, PROTIME in the last 168 hours.  Cardiac Enzymes: No results for input(s): CKTOTAL, CKMB, CKMBINDEX, TROPONINI in the last 168 hours.  HbA1C: Hemoglobin A1C  Date/Time Value Ref Range Status  01/02/2019 09:13 AM 6.1 (A) 4.0 - 5.6 % Final   Hgb A1c MFr Bld  Date/Time Value Ref Range Status  05/11/2019 10:27 AM 6.4 (H) 4.8 - 5.6 % Final    Comment:    (NOTE)         Prediabetes: 5.7 - 6.4          Diabetes: >6.4         Glycemic control for adults with diabetes: <7.0   07/01/2018 09:47 AM 6.2 (H) <5.7 % of total Hgb Final    Comment:    For someone without known diabetes, a hemoglobin  A1c value between 5.7% and 6.4% is consistent with prediabetes and should be confirmed with a  follow-up test. . For someone with known diabetes, a value <7% indicates that their diabetes is well controlled. A1c targets should be individualized based on duration of diabetes, age, comorbid conditions, and other considerations. . This assay result is consistent with an increased risk of diabetes. . Currently, no consensus exists regarding use of hemoglobin A1c for diagnosis of diabetes for children. .     CBG: Recent Labs  Lab 05/17/19 1929 05/17/19 2304 05/18/19 0318 05/18/19 0355 05/18/19 0721  GLUCAP 182* 190* 152* 141* 156*   CRITICAL CARE Performed by: Kipp Brood   Total critical care time: 40 minutes  Critical care time was exclusive of separately billable procedures and treating other patients.  Critical care was necessary to treat or prevent imminent or life-threatening deterioration.  Critical care was time spent personally  by me on the following activities: development of treatment plan with patient and/or surrogate as well as nursing, discussions with consultants, evaluation of patient's response to treatment, examination of patient, obtaining history from patient or surrogate, ordering and performing treatments and interventions, ordering and review of laboratory studies, ordering and review of radiographic studies, pulse oximetry, re-evaluation of patient's condition and participation in multidisciplinary rounds.  Kipp Brood, MD [facility] ICU Physician [place]  Pager: [phone] Mobile: [phone] After hours: [phone].

## 2019-05-18 NOTE — Progress Notes (Signed)
Assisted tele visit to patient with wife.  Matthew Eaton R, RN  

## 2019-05-18 NOTE — Progress Notes (Signed)
Assisted tele visit to patient with wife.  Romond Pipkins P, RN  

## 2019-05-18 NOTE — Progress Notes (Signed)
Pt appears very restless again (flailing arms), tachycardic (HR 122), tachypneac (RR-33). O2 sats decreased to 86%. FiO2 increased to 100% until we can get patient more comfortable. Pt suctioned but did not get any secretions from the ETT. Resp called to assess. PRN Fentanyl also given and Precedex infusion increased. Dr. Lynetta Mare informed and gave order to restart Fentanyl infusion.

## 2019-05-18 NOTE — Progress Notes (Signed)
Assisted tele visit to patient with family member.  Aurorah Schlachter P, RN  

## 2019-05-18 NOTE — Progress Notes (Signed)
Nutrition Follow-up  DOCUMENTATION CODES:   Obesity unspecified  INTERVENTION:   Tube Feeding: Vital 1.5 at 60 m/hr Pro-Stat 30 mL QID Provides 2560 kcals, 157 g of protein and 1094 mL of free water  Increase free water flush to 400 mL q 4 hours: total of 3494 mL   NUTRITION DIAGNOSIS:   Increased nutrient needs related to catabolic illness(Covid) as evidenced by estimated needs.  Being addressed via TF   GOAL:   Patient will meet greater than or equal to 90% of their needs  Progressing  MONITOR:   Vent status  REASON FOR ASSESSMENT:   Ventilator    ASSESSMENT:   Pt initially admitted for acute hypoxic respiratory failure due to COVID-19 PNA 3/31-4/5 and signed out AMA. Pt returned later on 4/5 C/O worsening hypoxemia and was re-admitted. PMH includes GERD, hiatal hernia, HLD, depression, and chronic pain.  4/08 Intubated 4/09 Cortrak placed  Pt remains on vent support, on precedex, tolerating SBT per MD  Vital 1.5 at 40 ml/hr, Pro-Stat 50 mL QID, free water 250 mL q 4 hours  Sodium increasing, currently 150. Free water flushes of 250 mL q 4 hours has not been adjusted since 4/8. Recommend increasing free water flushes to account for free water deficit  Labs: sodium 150 (H), potassium 3.4 (L), CBGs 141-190 Meds: questran, colace, lasix, ss novolog   Diet Order:   Diet Order    None      EDUCATION NEEDS:   Not appropriate for education at this time  Skin:  Skin Assessment: Reviewed RN Assessment  Last BM:  4/15 rectal tube  Height:   Ht Readings from Last 1 Encounters:  05/07/2019 5\' 10"  (1.778 m)    Weight:   Wt Readings from Last 1 Encounters:  05/18/19 93.1 kg    Ideal Body Weight:  75.45 kg  BMI:  Body mass index is 29.45 kg/m.  Estimated Nutritional Needs:   Kcal:  [id number]  Protein:  150-188 grams  Fluid:  >2L    Kerman Passey MS, RDN, LDN, CNSC RD Pager Number and Weekend/On-Call After Hours Pager Located in [place]

## 2019-05-18 NOTE — Progress Notes (Signed)
PT Cancellation Note  Patient Details Name: Matthew Eaton MRN: [id number] DOB: 1960-11-28   Cancelled Treatment:    Reason Eval/Treat Not Completed: Patient not medically ready;Medical issues which prohibited therapy.  Pt vented, sedated and in general status decline.  Will sign off at this time and await reorder when pt is able to participate. 05/18/2019  Ginger Carne., PT Acute Rehabilitation Services [phone]  (pager) [phone]  (office)   Tessie Fass Matthew Eaton 05/18/2019, 1:53 PM

## 2019-05-18 NOTE — Progress Notes (Addendum)
Pt appears very restless, tachypneic, HR-130s, RR-33, O2 sats 86% despite push of ordered PRN Fentanyl and reposition. Pt flipped back to full support on Ventilator (TV 550, RR-28, Peep 8, FiO2-70%). Pt suctioned x 2 with thick tan secretions obtained. Dr. Lynetta Mare came to bedside to assess patient. He increased patient's PEEP from 8 to 10 and placed order for Precedex infusion.

## 2019-05-18 NOTE — Progress Notes (Signed)
eLink Physician-Brief Progress Note Patient Name: Matthew Eaton DOB: 06-May-1960 MRN: [id number]   Date of Service  05/18/2019  HPI/Events of Note  Patient restless/agitated on ventilator. Currently receiving Precedex + fentanyl IV pushes PRN.   eICU Interventions  Switch to primary sedation/analgesia strategy of propofol + fentanyl IV pushes. Precedex can be weaned off if not needed once propofol starts.     Intervention Category Intermediate Interventions: Other:  Charlott Rakes 05/18/2019, 8:01 PM

## 2019-05-19 ENCOUNTER — Other Ambulatory Visit: Payer: Self-pay

## 2019-05-19 DIAGNOSIS — J9601 Acute respiratory failure with hypoxia: Secondary | ICD-10-CM | POA: Diagnosis not present

## 2019-05-19 DIAGNOSIS — U071 COVID-19: Secondary | ICD-10-CM | POA: Diagnosis not present

## 2019-05-19 LAB — GLUCOSE, CAPILLARY
Glucose-Capillary: 161 mg/dL — ABNORMAL HIGH (ref 70–99)
Glucose-Capillary: 168 mg/dL — ABNORMAL HIGH (ref 70–99)
Glucose-Capillary: 143 mg/dL — ABNORMAL HIGH (ref 70–99)
Glucose-Capillary: 130 mg/dL — ABNORMAL HIGH (ref 70–99)
Glucose-Capillary: 137 mg/dL — ABNORMAL HIGH (ref 70–99)

## 2019-05-19 MED ORDER — VECURONIUM BROMIDE 10 MG IV SOLR
INTRAVENOUS | Status: AC
  Administered 2019-05-19: 10 mg via INTRAVENOUS
  Filled 2019-05-19: qty 10

## 2019-05-19 MED ORDER — MIDAZOLAM HCL 2 MG/2ML IJ SOLN
INTRAMUSCULAR | Status: AC
  Administered 2019-05-19: 2 mg via INTRAVENOUS
  Filled 2019-05-19: qty 2

## 2019-05-19 MED ORDER — FENTANYL CITRATE (PF) 100 MCG/2ML IJ SOLN
INTRAMUSCULAR | Status: AC
  Administered 2019-05-19: 10:00:00 100 ug via INTRAVENOUS
  Filled 2019-05-19: qty 2

## 2019-05-19 MED ORDER — VECURONIUM BROMIDE 10 MG IV SOLR: 10.0000 mg | Freq: Once | INTRAVENOUS | Status: AC

## 2019-05-19 MED ORDER — FENTANYL CITRATE (PF) 100 MCG/2ML IJ SOLN: 100.0000 ug | Freq: Once | INTRAMUSCULAR | Status: AC

## 2019-05-19 MED ORDER — MIDAZOLAM HCL 2 MG/2ML IJ SOLN: 2.0000 mg | Freq: Once | INTRAMUSCULAR | Status: AC

## 2019-05-19 NOTE — Progress Notes (Addendum)
RT note: RT assisted DR Agarwala with bedside bronchoscopy Mucomyst was given down et tube during bronhcoscopy . Vital signs stable through out.

## 2019-05-19 NOTE — Progress Notes (Signed)
NAME:  Matthew Eaton, MRN:  [id number], DOB:  1960-12-11, LOS: 74 ADMISSION DATE:  05/20/2019, CONSULTATION DATE:  05/11/2019 REFERRING MD:  [facility], CHIEF COMPLAINT:  hypoxemia   Brief History    59 year old with a 9-day history of slowly worsening Covid pneumonia despite appropriate therapy Transferred to unit, intubated 5/8  History of present illness    Patient is a 59 year old male initially admitted 3?31 to 05/07/2019 for Covid pneumonia.  He signed out AMA the evening of 05/29/2019.  Then returned the evening of 4/5 complaining of worsening shortness of breath.  Presenting saturations at that time were in the 80s and he came up into the mid 90s on 100% oxygen.  We were asked to see early this morning in regards to worsening hypoxemia.  The patient was noncompliant with his oxygen.  On high flow nasal cannula at 100% the patient had an oxygen saturation of about 79%.  With the addition of 100% facemask the patient had oxygen saturation in the mid 80s.  He was verbal throughout and did not look particularly dyspneic.  Patient was urgently transferred to the ICU and intubated.  At the time of this dictation he is ventilated on 100% with an oxygen saturation between 85 and 95%. He completed his Decadron remdesivir and Actemra.  Chest x-ray performed earlier this morning shows worsening infiltrate particularly throughout the right lung.  Past Medical History   Past Medical History:  Diagnosis Date  . Chronic neck pain 05/20/2012  . Family hx of ALS (amyotrophic lateral sclerosis) 05/27/2013  . GERD (gastroesophageal reflux disease)   . Hiatal hernia   . HLD (hyperlipidemia)   . Major depressive disorder, recurrent episode, moderate ([place]) 05/27/2013  . Neck pain    cervical spine with herniation  . Tobacco abuse   . Wrist fracture, left    Significant Hospital Events    Transferred to ICU intubation 05/11/2019 4/11-proning 4/12-tolerating supine ventilation 4/13-tolerating  PEEP wean.  Consults:  [facility], PCCM  Procedures:  Intubation 05/11/2019 Central line placement 05/11/2019  Significant Diagnostic Tests:  Chest x-ray 4/15 bilateral interstitial infiltrates left> right.  Progressive clearing of left midlung field opacification.  Micro Data:  Respiratory cultures-positive WBC, abundant Staph aureus, Haemophilus influenza Covid PCR positive  Antimicrobials:  Cefepime 4/7-4/10 Ampicillin 4/10-4/11 Augmentin4/12>> Remdesivir-completed  Interim history/subjective:  More active on sedation interruption.  Will follow occasional commands. Oxygenation remains problematic.   Minimal secretions found on bronchoscopy today  Objective   Blood pressure (!) 100/58, pulse 93, temperature 99.4 F (37.4 C), temperature source Axillary, resp. rate (!) 28, height 5\' 10"  (1.778 m), weight 93.1 kg, SpO2 93 %.    Vent Mode: PRVC FiO2 (%):  [70 %-100 %] 100 % Set Rate:  [28 bmp] 28 bmp Vt Set:  [550 mL] 550 mL PEEP:  [10 [id number] cmH20] 12 cmH20 Plateau Pressure:  [25 cmH20-27 cmH20] 27 cmH20    Intake/Output Summary (Last 24 hours) at 05/19/2019 1305 Last data filed at 05/19/2019 1200 Gross per 24 hour  Intake 2200.9 ml  Output 3400 ml  Net -1199.1 ml   Filed Weights   05/16/19 0500 05/17/19 0400 05/18/19 0347  Weight: 98.1 kg 99.2 kg 93.1 kg   Examination: General: [name], chronically ill-appearing, average build. HENT: Moist mucosa Lungs: Diffuse rhonchi have improved. Cardiovascular: normal S1S2 Abdomen: Bowel sounds appreciated Extremities: No clubbing, trace edema Neuro: Opens eyes weakly to voice.  Moves all 4 limbs to command. GU: Foley catheter in place.  Resolved  Hospital Problem list     Assessment & Plan:   Critically ill due to acute hypoxemic respiratory failure requiring mechanical ventilation secondary to Covid pneumonia. Tolerating SBT but some worsening oxygenation.  Perhaps related to increased agitation.    -Continue daily SBT -Continue to minimize sedation. -Continue diuresis as needed. -We will stop chest physiotherapy.  Marland Kitchen COVID-19 pneumonia Has completed course of remdesivir and dexamethasone.  Superimposed bacterial pneumonia.  Fever and leukocytosis are improving. -Complete 5 days of Augmentin. -Continue to follow fever and leukocytosis.  No change in antibiotics so long as patient continues to wean improve neurologically.   Best practice:  Diet: Tube feeds Pain/Anxiety/Delirium protocol (if indicated): intermittent sedatives only.  Fluid status goals: Gentle diuresis. VAP protocol (if indicated): In place DVT prophylaxis: Lovenox Central lines: Remove arterial line GU: Transition to condom catheter. GI prophylaxis: Protonix Glucose control: SSI Mobility: Bedrest Code Status: Full code Family Communication: Updated by RN this morning. Disposition: ICU  Labs   CBC: Recent Labs  Lab 05/14/19 0415 05/14/19 1408 05/15/19 0402 05/15/19 [id number] 05/16/19 0538 05/16/19 0856 05/17/19 0428 05/17/19 0508 05/18/19 0702  WBC 12.9*  --  11.2*  --  12.9*  --   --  15.1* 13.3*  NEUTROABS 10.6*  --  9.5*  --  11.0*  --   --  13.6* 10.9*  HGB 11.3*   < > 11.0*   < > 11.2* 10.9* 11.2* 11.9* 11.9*  HCT 36.0*   < > 35.7*   < > 36.4* 32.0* 33.0* 39.1 39.1  MCV 98.1  --  98.9  --  100.6*  --   --  101.8* 101.0*  PLT 194  --  158  --  158  --   --  165 168   < > = values in this interval not displayed.    Basic Metabolic Panel: Recent Labs  Lab  0000 05/12/19 2018 05/13/19 0420 05/13/19 [id number] 05/14/19 0415 05/14/19 1408 05/15/19 0402 05/15/19 [id number] 05/16/19 0405 05/16/19 0856 05/17/19 0428 05/17/19 0508 05/18/19 0702  NA  --   --  142   < > 141   < > 140   < > 144 145 148* 149* 150*  K  --   --  4.9   < > 4.3   < > 4.4   < > 3.5 4.4 4.2 4.3 3.4*  CL   < >  --  108  --  109  --  106  --  116*  --   --  111 109  CO2   < >  --  24  --  26  --  25  --  22  --   --  28 28    GLUCOSE   < >  --  196*  --  129*  --  149*  --  126*  --   --  186* 146*  BUN   < >  --  49*  --  38*  --  44*  --  43*  --   --  47* 38*  CREATININE   < >  --  1.30*  --  1.09  --  0.89  --  0.80  --   --  1.15 1.02  CALCIUM   < >  --  8.8*  --  8.6*  --  8.3*  --  7.1*  --   --  9.0 8.9  MG  --  2.7* 2.8*  --   --   --   --   --   --   --   --   --   --  PHOS  --  3.3 2.1*  --   --   --   --   --   --   --   --   --   --    < > = values in this interval not displayed.   GFR: Estimated Creatinine Clearance: 90.4 mL/min (by C-G formula based on SCr of 1.02 mg/dL). Recent Labs  Lab 05/15/19 0402 05/16/19 0538 05/17/19 0508 05/18/19 0702  WBC 11.2* 12.9* 15.1* 13.3*    Liver Function Tests: No results for input(s): AST, ALT, ALKPHOS, BILITOT, PROT, ALBUMIN in the last 168 hours. No results for input(s): LIPASE, AMYLASE in the last 168 hours. No results for input(s): AMMONIA in the last 168 hours.  ABG    Component Value Date/Time   PHART 7.445 05/17/2019 0428   PCO2ART 40.3 05/17/2019 0428   PO2ART 69.0 (L) 05/17/2019 0428   HCO3 27.4 05/17/2019 0428   TCO2 28 05/17/2019 0428   ACIDBASEDEF 5.0 (H) 05/12/2019 0456   O2SAT 93.0 05/17/2019 0428     Coagulation Profile: No results for input(s): INR, PROTIME in the last 168 hours.  Cardiac Enzymes: No results for input(s): CKTOTAL, CKMB, CKMBINDEX, TROPONINI in the last 168 hours.  HbA1C: Hemoglobin A1C  Date/Time Value Ref Range Status  01/02/2019 09:13 AM 6.1 (A) 4.0 - 5.6 % Final   Hgb A1c MFr Bld  Date/Time Value Ref Range Status  05/11/2019 10:27 AM 6.4 (H) 4.8 - 5.6 % Final    Comment:    (NOTE)         Prediabetes: 5.7 - 6.4         Diabetes: >6.4         Glycemic control for adults with diabetes: <7.0   07/01/2018 09:47 AM 6.2 (H) <5.7 % of total Hgb Final    Comment:    For someone without known diabetes, a hemoglobin  A1c value between 5.7% and 6.4% is consistent with prediabetes and should be  confirmed with a  follow-up test. . For someone with known diabetes, a value <7% indicates that their diabetes is well controlled. A1c targets should be individualized based on duration of diabetes, age, comorbid conditions, and other considerations. . This assay result is consistent with an increased risk of diabetes. . Currently, no consensus exists regarding use of hemoglobin A1c for diagnosis of diabetes for children. .     CBG: Recent Labs  Lab 05/18/19 1521 05/18/19 2327 05/19/19 0349 05/19/19 0822 05/19/19 1131  GLUCAP 160* 163* 161* 168* 143*   CRITICAL CARE Performed by: Kipp Brood   Total critical care time: 40 minutes  Critical care time was exclusive of separately billable procedures and treating other patients.  Critical care was necessary to treat or prevent imminent or life-threatening deterioration.  Critical care was time spent personally by me on the following activities: development of treatment plan with patient and/or surrogate as well as nursing, discussions with consultants, evaluation of patient's response to treatment, examination of patient, obtaining history from patient or surrogate, ordering and performing treatments and interventions, ordering and review of laboratory studies, ordering and review of radiographic studies, pulse oximetry, re-evaluation of patient's condition and participation in multidisciplinary rounds.  Kipp Brood, MD [facility] ICU Physician [place]  Pager: [phone] Mobile: [phone] After hours: [phone].

## 2019-05-19 NOTE — Progress Notes (Addendum)
Assisted tele visit to patient with family member.  Tiajah Oyster M, RN   

## 2019-05-19 NOTE — Progress Notes (Signed)
Assisted tele visit to patient with wife.  Japji Kok Anderson, RN   

## 2019-05-20 ENCOUNTER — Inpatient Hospital Stay (HOSPITAL_COMMUNITY): Payer: PPO

## 2019-05-20 DIAGNOSIS — U071 COVID-19: Secondary | ICD-10-CM | POA: Diagnosis not present

## 2019-05-20 DIAGNOSIS — J9601 Acute respiratory failure with hypoxia: Secondary | ICD-10-CM | POA: Diagnosis not present

## 2019-05-20 LAB — CBC WITH DIFFERENTIAL/PLATELET
Abs Immature Granulocytes: 0.05 10*3/uL (ref 0.00–0.07)
Basophils Absolute: 0 10*3/uL (ref 0.0–0.1)
Basophils Relative: 1 %
Eosinophils Absolute: 0.3 10*3/uL (ref 0.0–0.5)
Eosinophils Relative: 4 %
HCT: 35.4 % — ABNORMAL LOW (ref 39.0–52.0)
Hemoglobin: 10.7 g/dL — ABNORMAL LOW (ref 13.0–17.0)
Immature Granulocytes: 1 %
Lymphocytes Relative: 15 %
Lymphs Abs: 1.3 10*3/uL (ref 0.7–4.0)
MCH: 30.4 pg (ref 26.0–34.0)
MCHC: 30.2 g/dL (ref 30.0–36.0)
MCV: 100.6 fL — ABNORMAL HIGH (ref 80.0–100.0)
Monocytes Absolute: 0.5 10*3/uL (ref 0.1–1.0)
Monocytes Relative: 6 %
Neutro Abs: 6.4 10*3/uL (ref 1.7–7.7)
Neutrophils Relative %: 73 %
Platelets: 168 10*3/uL (ref 150–400)
RBC: 3.52 MIL/uL — ABNORMAL LOW (ref 4.22–5.81)
RDW: 15.9 % — ABNORMAL HIGH (ref 11.5–15.5)
WBC: 8.7 10*3/uL (ref 4.0–10.5)
nRBC: 0 % (ref 0.0–0.2)

## 2019-05-20 LAB — BASIC METABOLIC PANEL
Anion gap: 8 (ref 5–15)
BUN: 39 mg/dL — ABNORMAL HIGH (ref 6–20)
CO2: 33 mmol/L — ABNORMAL HIGH (ref 22–32)
Calcium: 8.7 mg/dL — ABNORMAL LOW (ref 8.9–10.3)
Chloride: 111 mmol/L (ref 98–111)
Creatinine, Ser: 1.28 mg/dL — ABNORMAL HIGH (ref 0.61–1.24)
GFR calc Af Amer: >60 mL/min (ref >60)
GFR calc non Af Amer: >60 mL/min (ref >60)
Glucose, Bld: 143 mg/dL — ABNORMAL HIGH (ref 70–99)
Potassium: 3.1 mmol/L — ABNORMAL LOW (ref 3.5–5.1)
Sodium: 152 mmol/L — ABNORMAL HIGH (ref 135–145)

## 2019-05-20 LAB — GLUCOSE, CAPILLARY
Glucose-Capillary: 119 mg/dL — ABNORMAL HIGH (ref 70–99)
Glucose-Capillary: 138 mg/dL — ABNORMAL HIGH (ref 70–99)
Glucose-Capillary: 146 mg/dL — ABNORMAL HIGH (ref 70–99)
Glucose-Capillary: 147 mg/dL — ABNORMAL HIGH (ref 70–99)
Glucose-Capillary: 150 mg/dL — ABNORMAL HIGH (ref 70–99)
Glucose-Capillary: 169 mg/dL — ABNORMAL HIGH (ref 70–99)

## 2019-05-20 MED ORDER — POTASSIUM CHLORIDE 20 MEQ/15ML (10%) PO SOLN
30.0000 meq | ORAL | Status: AC
  Administered 2019-05-20 (×2): 30 meq
  Filled 2019-05-20 (×2): qty 30

## 2019-05-20 MED ORDER — METOLAZONE 5 MG PO TABS
5.0000 mg | ORAL_TABLET | Freq: Once | ORAL | Status: AC
  Administered 2019-05-20: 5 mg via ORAL
  Filled 2019-05-20: qty 1

## 2019-05-20 MED ORDER — VECURONIUM BROMIDE 10 MG IV SOLR
10.0000 mg | Freq: Once | INTRAVENOUS | Status: AC
  Administered 2019-05-20: 10 mg via INTRAVENOUS
  Filled 2019-05-20: qty 10

## 2019-05-20 MED ORDER — MIDAZOLAM HCL 2 MG/2ML IJ SOLN
5.0000 mg | Freq: Once | INTRAMUSCULAR | Status: AC
  Administered 2019-05-20: 2 mg via INTRAVENOUS
  Filled 2019-05-20: qty 6

## 2019-05-20 MED ORDER — FENTANYL CITRATE (PF) 100 MCG/2ML IJ SOLN
200.0000 ug | Freq: Once | INTRAMUSCULAR | Status: AC
  Administered 2019-05-20: 100 ug via INTRAVENOUS
  Filled 2019-05-20: qty 4

## 2019-05-20 MED ORDER — ENOXAPARIN SODIUM 40 MG/0.4ML ~~LOC~~ SOLN
40.0000 mg | Freq: Two times a day (BID) | SUBCUTANEOUS | Status: DC
  Administered 2019-05-21 – 2019-06-01 (×23): 40 mg via SUBCUTANEOUS
  Filled 2019-05-20 (×23): qty 0.4

## 2019-05-20 MED ORDER — ETOMIDATE 2 MG/ML IV SOLN
40.0000 mg | Freq: Once | INTRAVENOUS | Status: AC
  Administered 2019-05-20: 10:00:00 40 mg via INTRAVENOUS
  Filled 2019-05-20 (×2): qty 20

## 2019-05-20 NOTE — Progress Notes (Signed)
NAME:  Matthew Eaton, MRN:  [id number], DOB:  [birth year], LOS: 12 ADMISSION DATE:  05/31/2019, CONSULTATION DATE:  05/11/2019 REFERRING MD:  [facility], CHIEF COMPLAINT:  hypoxemia   Brief History    59 year old with a 9-day history of slowly worsening Covid pneumonia despite appropriate therapy Transferred to unit, intubated 5/8  History of present illness    Patient is a 59 year old male initially admitted 3?31 to 05/20/2019 for Covid pneumonia.  He signed out AMA the evening of 05/12/2019.  Then returned the evening of 4/5 complaining of worsening shortness of breath.  Presenting saturations at that time were in the 80s and he came up into the mid 90s on 100% oxygen.  We were asked to see early this morning in regards to worsening hypoxemia.  The patient was noncompliant with his oxygen.  On high flow nasal cannula at 100% the patient had an oxygen saturation of about 79%.  With the addition of 100% facemask the patient had oxygen saturation in the mid 80s.  He was verbal throughout and did not look particularly dyspneic.  Patient was urgently transferred to the ICU and intubated.  At the time of this dictation he is ventilated on 100% with an oxygen saturation between 85 and 95%. He completed his Decadron remdesivir and Actemra.  Chest x-ray performed earlier this morning shows worsening infiltrate particularly throughout the right lung.  Past Medical History   Past Medical History:  Diagnosis Date  . Chronic neck pain 05/20/2012  . Family hx of ALS (amyotrophic lateral sclerosis) 05/27/2013  . GERD (gastroesophageal reflux disease)   . Hiatal hernia   . HLD (hyperlipidemia)   . Major depressive disorder, recurrent episode, moderate ([place]) 05/27/2013  . Neck pain    cervical spine with herniation  . Tobacco abuse   . Wrist fracture, left    Significant Hospital Events    Transferred to ICU intubation 05/11/2019 4/11-proning 4/12-tolerating supine ventilation 4/13-tolerating  PEEP wean.  Consults:  [facility], PCCM  Procedures:  Intubation 05/11/2019 Central line placement 05/11/2019  Significant Diagnostic Tests:  Chest x-ray 4/15 bilateral interstitial infiltrates left> right.  Progressive clearing of left midlung field opacification.  Micro Data:  Respiratory cultures-positive WBC, abundant Staph aureus, Haemophilus influenza Covid PCR positive 3/31  Antimicrobials:  Cefepime 4/7-4/10 Ampicillin 4/10-4/11 Augmentin4/12>> Remdesivir-completed  Interim history/subjective:  Sedated, PEEP 12, FiO2 0.8, sats 94% On propofol and fentanyl.  Objective   Blood pressure 104/69, pulse 79, temperature 100.1 F (37.8 C), temperature source Axillary, resp. rate (!) 29, height 5\' 10"  (1.778 m), weight 93.1 kg, SpO2 95 %.    Vent Mode: PRVC FiO2 (%):  [60 %-100 %] 80 % Set Rate:  [28 bmp] 28 bmp Vt Set:  [550 mL] 550 mL PEEP:  [12 cmH20] 12 cmH20 Plateau Pressure:  [22 cmH20-27 cmH20] 25 cmH20    Intake/Output Summary (Last 24 hours) at 05/20/2019 0713 Last data filed at 05/20/2019 0600 Gross per 24 hour  Intake 3184.03 ml  Output 2550 ml  Net 634.03 ml   Filed Weights   05/16/19 0500 05/17/19 0400 05/18/19 0347  Weight: 98.1 kg 99.2 kg 93.1 kg   Examination: GEN: appears older than stated age 33: ETT with no significant secretions CV: RRR, ext warm PULM: Suprisingly clear, no wheezing GI: Soft, hypoactive BS EXT: No edema NEURO: Withdraws x 4 PSYCH: RASS -2 SKIN: No rashes  Vent: PEEP 12, plat 26, DP =14  Sodium 152 Cr 1.0>>1.3, 1950 UoP, +1L admission CBC benign 4/15  CXR L>R severe airspace disease  Condom cath Rectal tube PIV 7.5ETT OGT  Resolved Hospital Problem list     Assessment & Plan:   Critically ill due to acute hypoxemic respiratory failure requiring mechanical ventilation secondary to Covid pneumonia.  Superimposed MSSA and haemophilus pneumonia. Remains on high vent settings Will likely need trach for  faster/safer vent liberation, will discuss with family today. Continue aggressive free water, 1x dose of metolazone . COVID-19 pneumonia Has completed course of remdesivir and dexamethasone.  Superimposed bacterial pneumonia.  Fever and leukocytosis are improving. -Complete 5 days of Augmentin. -Continue to follow fever and leukocytosis.  No change in antibiotics so long as patient continues to wean improve neurologically.     Best practice:  Diet: Tube feeds Pain/Anxiety/Delirium protocol (if indicated): on propofol and fentanyl; hopefully wean more aggressively after trach Fluid status goals: even to negative while maintaining sem-normal electrolytes VAP protocol (if indicated): In place DVT prophylaxis: Lovenox Central lines: none GU: condom catheter. GI prophylaxis: Protonix Glucose control: SSI Mobility: Bedrest Code Status: Full code Family Communication: Updated 4/17 Disposition: ICU   The patient is critically ill with multiple organ systems failure and requires high complexity decision making for assessment and support, frequent evaluation and titration of therapies, application of advanced monitoring technologies and extensive interpretation of multiple databases. Critical Care Time devoted to patient care services described in this note independent of APP/resident time (if applicable)  is 34 minutes.   Erskine Emery MD [facility] 05/20/2019 7:16 AM Personal pager: [phone] If unanswered, please page CCM On-call: [phone]

## 2019-05-20 NOTE — Progress Notes (Signed)
SLP Cancellation Note  Patient Details Name: Matthew Eaton MRN: [id number] DOB: [birth year]   Cancelled treatment:       Reason Eval/Treat Not Completed: Other (comment) Patient with new tracheostomy today. Orders for SLP eval and treat for PMSV and swallowing received. Will follow pt closely for readiness for SLP interventions as appropriate.      Osie Bond., M.A. [facility] Pager [phone] Office [phone]  05/20/2019, 11:01 AM

## 2019-05-20 NOTE — Progress Notes (Signed)
[facility] Progress Note Patient Name: Matthew Eaton DOB: [birth year] MRN: [id number]   Date of Service  05/20/2019  HPI/Events of Note  No AM lab orders. Request for CBC with platelets and BMP in AM.  eICU Interventions  Will order: 1. CBC with platelets and BMP in AM.      Intervention Category Major Interventions: Other:  Lysle Dingwall 05/20/2019, 12:33 AM

## 2019-05-20 NOTE — Progress Notes (Signed)
[facility] ADULT ICU REPLACEMENT PROTOCOL FOR AM LAB REPLACEMENT ONLY  The patient does apply for the [facility] Adult ICU Electrolyte Replacment Protocol based on the criteria listed below:   1. Is GFR >/= 40 ml/min? Yes.    Patient's GFR today is >60 2. Is urine output >/= 0.5 ml/kg/hr for the last 6 hours? Yes.   Patient's UOP is 1.34 ml/kg/hr 3. Is BUN < 60 mg/dL? Yes.    Patient's BUN today is 39 4. Abnormal electrolyte(s): K 3.1 5. Ordered repletion with: K 6. If a panic level lab has been reported, has the CCM MD in charge been notified? No. Physician:  Dr. Oletta Darter  Matthew Eaton M [id number] AM

## 2019-05-20 NOTE — Progress Notes (Signed)
Assisted tele visit to patient with wife.  Duc Crocket M, RN  

## 2019-05-20 NOTE — Procedures (Signed)
Bronchoscopy Procedure Note Ze Colaluca [id number] 25-Mar-1960  Procedure: Bronchoscopy Indications: Diagnostic evaluation of the airways, direct visualization of percutaneous tracheostomy placement   Procedure Details Consent: Risks of procedure as well as the alternatives and risks of each were explained to the (patient/caregiver).  Consent for procedure obtained. Time Out: Verified patient identification, verified procedure, site/side was marked, verified correct patient position, special equipment/implants available, medications/allergies/relevent history reviewed, required imaging and test results available.  Performed  In preparation for procedure, patient was given 100% FiO2 and bronchoscope lubricated. Sedation: Benzodiazepines, Muscle relaxants and Etomidate  Airway entered and the following bronchi were examined: RUL, RML, RLL, LUL, LLL and Bronchi.   Procedures performed: direct diagnostic view of airways. No mucus plugging. Bilateral airways clear.   Percutaneous tracheostomy was performed by Dr. Tamala Julian.  The needle was placed into the trachea and a guidewire was passed  There was no visible injury to the posterior wall.  All sequential dilations were visualized.   Once the tracheostomy was placed the proximal white portion of the flange was visualized and the balloon was visualized.   Bronchoscope removed.    Evaluation Hemodynamic Status: BP stable throughout; O2 sats: stable throughout Patient's Current Condition: stable Specimens:  None Complications: No apparent complications Patient did tolerate procedure well.   Octavio Graves Mehak Roskelley 05/20/2019

## 2019-05-20 NOTE — Progress Notes (Signed)
RT note: Assisted DR Tamala Julian with bedside bronchoscopy with trach placement. Patient pre and pot oxygenated. Patient tolerated procedure well.

## 2019-05-20 NOTE — Procedures (Signed)
Procedure: Percutaneous Tracheostomy CPT 31600 Performed by: Dr. Dan Rhondalyn Clingan Bronchoscopy Assistant: Dr. Icard.  Indications: Chronic respiratory failure and need for ongoing mechanical ventilation.  Consent: Signed in chart  Preprocedure: Universal protocol was followed for this procedure. Timeout performed. Anterior neck prepped and draped.  Anesthesia: The patient was intubated and sedated prior to the procedure. Additional midazolam, etomidate, fentanyl and vecuronium were given for sedation and paralysis with close attention to vital signs throughout procedure.   Procedure: The patient was placed in the supine position. The anterior neck was prepped and draped in usual sterile fashion. 1% lidocaine was administered approximately 2 fingerbreadths above the sternal notch for local anesthesia. A 1.5-cm vertical incision was then performed 2 fingerbreadths above the sternal notch. Using a curved Kelly, blunt dissection was performed down to the level of the pretracheal fascia. At this point, the bronchoscope was introduced through the endotracheal tube and the trachea was properly visualized. The endotracheal tube was then gradually withdrawn within the trachea under direct bronchoscopic visualization. Proper midline position was confirmed by bouncing the needle from the tracheostomy tray over the trachea with bronchoscopic examination. The needle was advanced into the trachea and proper positioning was confirmed with direct visualization. The needle was then removed leaving a white outer cannula in position. The wire from the tracheostomy tray was then advanced through the white outer cannula. The cannula was then removed. The small, blue dilator was then advanced over the wire into the trachea. Once proper dilatation was achieved, the dilator was removed. The large, tapered dilator was then advanced over the wire into the trachea. The dilator was removed leaving the wire and white inner cannula in  position. A number 6-0 percutaneous Shiley tracheostomy tube was then advanced over the wire and white inner cannula into the trachea. Proper positioning was confirmed with bronchoscopic visualization. The tracheostomy tube was then sutured in place with four nylon sutures. It was further secured with a tracheostomy tie.  Estimated blood loss: Less than 5 mL.  Complications: None immediate.  CXR ordered.   

## 2019-05-21 DIAGNOSIS — U071 COVID-19: Secondary | ICD-10-CM | POA: Diagnosis not present

## 2019-05-21 DIAGNOSIS — J9601 Acute respiratory failure with hypoxia: Secondary | ICD-10-CM | POA: Diagnosis not present

## 2019-05-21 LAB — PHOSPHORUS: Phosphorus: 4.9 mg/dL — ABNORMAL HIGH (ref 2.5–4.6)

## 2019-05-21 LAB — CBC
HCT: 38.3 % — ABNORMAL LOW (ref 39.0–52.0)
Hemoglobin: 11.9 g/dL — ABNORMAL LOW (ref 13.0–17.0)
MCH: 31.4 pg (ref 26.0–34.0)
MCHC: 31.1 g/dL (ref 30.0–36.0)
MCV: 101.1 fL — ABNORMAL HIGH (ref 80.0–100.0)
Platelets: 180 10*3/uL (ref 150–400)
RBC: 3.79 MIL/uL — ABNORMAL LOW (ref 4.22–5.81)
RDW: 16.3 % — ABNORMAL HIGH (ref 11.5–15.5)
WBC: 13 10*3/uL — ABNORMAL HIGH (ref 4.0–10.5)
nRBC: 0 % (ref 0.0–0.2)

## 2019-05-21 LAB — BASIC METABOLIC PANEL
Anion gap: 12 (ref 5–15)
BUN: 51 mg/dL — ABNORMAL HIGH (ref 6–20)
CO2: 28 mmol/L (ref 22–32)
Calcium: 8.8 mg/dL — ABNORMAL LOW (ref 8.9–10.3)
Chloride: 106 mmol/L (ref 98–111)
Creatinine, Ser: 1.25 mg/dL — ABNORMAL HIGH (ref 0.61–1.24)
GFR calc Af Amer: >60 mL/min (ref >60)
GFR calc non Af Amer: >60 mL/min (ref >60)
Glucose, Bld: 159 mg/dL — ABNORMAL HIGH (ref 70–99)
Potassium: 3.5 mmol/L (ref 3.5–5.1)
Sodium: 146 mmol/L — ABNORMAL HIGH (ref 135–145)

## 2019-05-21 LAB — GLUCOSE, CAPILLARY
Glucose-Capillary: 163 mg/dL — ABNORMAL HIGH (ref 70–99)
Glucose-Capillary: 177 mg/dL — ABNORMAL HIGH (ref 70–99)
Glucose-Capillary: 169 mg/dL — ABNORMAL HIGH (ref 70–99)
Glucose-Capillary: 154 mg/dL — ABNORMAL HIGH (ref 70–99)
Glucose-Capillary: 164 mg/dL — ABNORMAL HIGH (ref 70–99)
Glucose-Capillary: 207 mg/dL — ABNORMAL HIGH (ref 70–99)

## 2019-05-21 LAB — TRIGLYCERIDES: Triglycerides: 569 mg/dL — ABNORMAL HIGH (ref <150)

## 2019-05-21 LAB — PROCALCITONIN: Procalcitonin: 0.34 ng/mL

## 2019-05-21 LAB — MAGNESIUM: Magnesium: 2.7 mg/dL — ABNORMAL HIGH (ref 1.7–2.4)

## 2019-05-21 MED ORDER — METOLAZONE 5 MG PO TABS
5.0000 mg | ORAL_TABLET | Freq: Once | ORAL | Status: AC
  Administered 2019-05-21: 5 mg via ORAL
  Filled 2019-05-21: qty 1

## 2019-05-21 MED ORDER — POTASSIUM CHLORIDE 20 MEQ/15ML (10%) PO SOLN
40.0000 meq | ORAL | Status: AC
  Administered 2019-05-21: 40 meq
  Filled 2019-05-21: qty 30

## 2019-05-21 MED ORDER — QUETIAPINE FUMARATE 50 MG PO TABS
50.0000 mg | ORAL_TABLET | Freq: Every day | ORAL | Status: DC
  Administered 2019-05-21 – 2019-05-25 (×5): 50 mg
  Filled 2019-05-21 (×5): qty 1

## 2019-05-21 MED ORDER — CHOLESTYRAMINE 4 G PO PACK
4.0000 g | PACK | Freq: Two times a day (BID) | ORAL | Status: DC
  Administered 2019-05-21 – 2019-05-27 (×12): 4 g
  Filled 2019-05-21 (×12): qty 1

## 2019-05-21 MED ORDER — POTASSIUM CHLORIDE 20 MEQ/15ML (10%) PO SOLN
40.0000 meq | ORAL | Status: DC
  Administered 2019-05-21: 40 meq via ORAL
  Filled 2019-05-21: qty 30

## 2019-05-21 NOTE — Progress Notes (Signed)
PT Cancellation Note  Patient Details Name: Matthew Eaton MRN: [id number] DOB: 1960/11/06   Cancelled Treatment:    Reason Eval/Treat Not Completed: Patient not medically ready. Pt underwent trach placement 4/17. Remains sedated on vent. Per MD note, sedation wean today. Spoke with RN. Pt not following commands, recommending hold PT eval today and follow up tomorrow.   Lorriane Shire 05/21/2019, 11:06 AM   Lorrin Goodell, PT  Office # [phone] Pager [phone]

## 2019-05-21 NOTE — Progress Notes (Signed)
Assisted tele visit to patient with wife.  Matthew Eaton, Janace Hoard, RN

## 2019-05-21 NOTE — Progress Notes (Signed)
Video call with wife. There was  No response at [email or web address]. Call immediatly picked up from [phone]

## 2019-05-21 NOTE — Progress Notes (Signed)
NAME:  Stephon Dernbach, MRN:  [id number], DOB:  03-May-1960, LOS: 59 ADMISSION DATE:  05/09/2019, CONSULTATION DATE:  05/11/2019 REFERRING MD:  [facility], CHIEF COMPLAINT:  hypoxemia   Brief History    59 year old with a 9-day history of slowly worsening Covid pneumonia despite appropriate therapy Transferred to unit, intubated 5/8  History of present illness    Patient is a 59 year old male initially admitted 3?31 to 05/30/2019 for Covid pneumonia.  He signed out AMA the evening of 05/28/2019.  Then returned the evening of 4/5 complaining of worsening shortness of breath.  Presenting saturations at that time were in the 80s and he came up into the mid 90s on 100% oxygen.  We were asked to see early this morning in regards to worsening hypoxemia.  The patient was noncompliant with his oxygen.  On high flow nasal cannula at 100% the patient had an oxygen saturation of about 79%.  With the addition of 100% facemask the patient had oxygen saturation in the mid 80s.  He was verbal throughout and did not look particularly dyspneic.  Patient was urgently transferred to the ICU and intubated.  At the time of this dictation he is ventilated on 100% with an oxygen saturation between 85 and 95%. He completed his Decadron remdesivir and Actemra.  Chest x-ray performed earlier this morning shows worsening infiltrate particularly throughout the right lung.  Past Medical History   Past Medical History:  Diagnosis Date  . Chronic neck pain 05/20/2012  . Family hx of ALS (amyotrophic lateral sclerosis) 05/27/2013  . GERD (gastroesophageal reflux disease)   . Hiatal hernia   . HLD (hyperlipidemia)   . Major depressive disorder, recurrent episode, moderate ([place]) 05/27/2013  . Neck pain    cervical spine with herniation  . Tobacco abuse   . Wrist fracture, left    Significant Hospital Events    Transferred to ICU intubation 05/11/2019 4/11-proning 4/12-tolerating supine ventilation 4/13-tolerating  PEEP wean. 4/18- trach to facilitate vent wean  Consults:  [facility], PCCM  Procedures:  Intubation 05/11/2019 Central line placement 05/11/2019 4/18 trach  Significant Diagnostic Tests:  Chest x-ray 4/15 bilateral interstitial infiltrates left> right.  Progressive clearing of left midlung field opacification.  Micro Data:  Respiratory cultures-positive WBC, abundant Staph aureus, Haemophilus influenza Covid PCR positive 3/31  Antimicrobials:  Cefepime 4/7-4/10 Ampicillin 4/10-4/11 Augmentin 4/12-4/16 Remdesivir-completed  Interim history/subjective:  No events, still heavily sedated for some reason.  Objective   Blood pressure 117/69, pulse 93, temperature 100 F (37.8 C), temperature source Axillary, resp. rate (!) 30, height 5\' 10"  (1.778 m), weight 90.8 kg, SpO2 95 %.    Vent Mode: PRVC FiO2 (%):  [80 %] 80 % Set Rate:  [28 bmp] 28 bmp Vt Set:  [550 mL] 550 mL PEEP:  [12 cmH20] 12 cmH20 Plateau Pressure:  [25 [id number] cmH20] 28 cmH20    Intake/Output Summary (Last 24 hours) at 05/21/2019 0717 Last data filed at 05/21/2019 0600 Gross per 24 hour  Intake 1168.36 ml  Output 3400 ml  Net -2231.64 ml   Filed Weights   05/18/19 0347 05/20/19 0800 05/21/19 0445  Weight: 93.1 kg 93.5 kg 90.8 kg   Examination: GEN: appears older than stated age 9: trach with small blood-tinged secretions CV: RRR, ext warm PULM: Suprisingly clear, no wheezing GI: Soft, hypoactive BS EXT: No edema NEURO: Withdraws x 4 PSYCH: RASS -2 SKIN: No rashes   BMP pending White count up 4/16 CXR L>R severe airspace disease  Condom  cath Rectal tube PIV 6-0 shiley OGT  Resolved Hospital Problem list     Assessment & Plan:   Critically ill due to acute hypoxemic respiratory failure requiring mechanical ventilation secondary to Covid pneumonia.  Superimposed MSSA and haemophilus pneumonia.  Sueprimposed bacterial pneumonia treated x 5 days.  Spiked fever yesterday  evening. - Check sputum culture, Pct, low threshold to start abx - f/u AM BMP and will try more diuresis - AM CXR - Sedation wean and potential TCT today . COVID-19 pneumonia Has completed course of remdesivir and dexamethasone.  Muscular deconditioning- PT/OT consults, either chair position in bed or move to chair  Best practice:  Diet: Tube feeds Pain/Anxiety/Delirium protocol (if indicated): wean Fluid status goals: even to negative while maintaining semi-normal electrolytes VAP protocol (if indicated): In place DVT prophylaxis: Lovenox Central lines: none GU: condom catheter. GI prophylaxis: Protonix Glucose control: SSI Mobility: Bedrest Code Status: Full code Family Communication: called and updated wife 4/18 Disposition: ICU   The patient is critically ill with multiple organ systems failure and requires high complexity decision making for assessment and support, frequent evaluation and titration of therapies, application of advanced monitoring technologies and extensive interpretation of multiple databases. Critical Care Time devoted to patient care services described in this note independent of APP/resident time (if applicable)  is 32 minutes.   Erskine Emery MD [facility] 05/21/2019 7:17 AM Personal pager: [phone] If unanswered, please page CCM On-call: [phone]

## 2019-05-22 ENCOUNTER — Inpatient Hospital Stay (HOSPITAL_COMMUNITY): Payer: PPO

## 2019-05-22 DIAGNOSIS — J1282 Pneumonia due to coronavirus disease 2019: Secondary | ICD-10-CM

## 2019-05-22 LAB — BASIC METABOLIC PANEL
Anion gap: 15 (ref 5–15)
BUN: 60 mg/dL — ABNORMAL HIGH (ref 6–20)
CO2: 27 mmol/L (ref 22–32)
Calcium: 9.2 mg/dL (ref 8.9–10.3)
Chloride: 101 mmol/L (ref 98–111)
Creatinine, Ser: 1.13 mg/dL (ref 0.61–1.24)
GFR calc Af Amer: >60 mL/min (ref >60)
GFR calc non Af Amer: >60 mL/min (ref >60)
Glucose, Bld: 169 mg/dL — ABNORMAL HIGH (ref 70–99)
Potassium: 3.6 mmol/L (ref 3.5–5.1)
Sodium: 143 mmol/L (ref 135–145)

## 2019-05-22 LAB — CBC
HCT: 39.9 % (ref 39.0–52.0)
Hemoglobin: 12.2 g/dL — ABNORMAL LOW (ref 13.0–17.0)
MCH: 30.3 pg (ref 26.0–34.0)
MCHC: 30.6 g/dL (ref 30.0–36.0)
MCV: 99 fL (ref 80.0–100.0)
Platelets: 197 10*3/uL (ref 150–400)
RBC: 4.03 MIL/uL — ABNORMAL LOW (ref 4.22–5.81)
RDW: 16.3 % — ABNORMAL HIGH (ref 11.5–15.5)
WBC: 12.6 10*3/uL — ABNORMAL HIGH (ref 4.0–10.5)
nRBC: 0.2 % (ref 0.0–0.2)

## 2019-05-22 LAB — GLUCOSE, CAPILLARY
Glucose-Capillary: 176 mg/dL — ABNORMAL HIGH (ref 70–99)
Glucose-Capillary: 171 mg/dL — ABNORMAL HIGH (ref 70–99)
Glucose-Capillary: 188 mg/dL — ABNORMAL HIGH (ref 70–99)
Glucose-Capillary: 189 mg/dL — ABNORMAL HIGH (ref 70–99)
Glucose-Capillary: 169 mg/dL — ABNORMAL HIGH (ref 70–99)
Glucose-Capillary: 199 mg/dL — ABNORMAL HIGH (ref 70–99)

## 2019-05-22 LAB — PROCALCITONIN: Procalcitonin: 0.41 ng/mL

## 2019-05-22 LAB — PHOSPHORUS: Phosphorus: 3.6 mg/dL (ref 2.5–4.6)

## 2019-05-22 LAB — MAGNESIUM: Magnesium: 2.7 mg/dL — ABNORMAL HIGH (ref 1.7–2.4)

## 2019-05-22 MED ORDER — VANCOMYCIN HCL IN DEXTROSE 1-5 GM/200ML-% IV SOLN
1000.0000 mg | Freq: Two times a day (BID) | INTRAVENOUS | Status: DC
  Administered 2019-05-23: 1000 mg via INTRAVENOUS
  Filled 2019-05-22 (×2): qty 200

## 2019-05-22 MED ORDER — VANCOMYCIN HCL 2000 MG/400ML IV SOLN
2000.0000 mg | Freq: Once | INTRAVENOUS | Status: AC
  Administered 2019-05-22: 13:00:00 2000 mg via INTRAVENOUS
  Filled 2019-05-22: qty 400

## 2019-05-22 MED ORDER — OXYCODONE HCL 5 MG/5ML PO SOLN
5.0000 mg | Freq: Four times a day (QID) | ORAL | Status: DC
  Administered 2019-05-22 – 2019-05-25 (×12): 5 mg via ORAL
  Filled 2019-05-22 (×12): qty 5

## 2019-05-22 MED ORDER — FREE WATER
200.0000 mL | Status: DC
  Administered 2019-05-22 – 2019-05-24 (×11): 200 mL

## 2019-05-22 NOTE — Progress Notes (Signed)
PT Cancellation Note  Patient Details Name: Matthew Eaton MRN: [id number] DOB: [birth year]   Cancelled Treatment:    Reason Eval/Treat Not Completed: Patient not medically ready; spoke with RN who reports patient remains sedated on vent and has fever.  Will attempt again another day.    Reginia Naas 05/22/2019, 3:27 PM  Magda Kiel, [place] [phone] 05/22/2019

## 2019-05-22 NOTE — Progress Notes (Signed)
NAME:  Matthew Eaton, MRN:  [id number], DOB:  [birth year], LOS: 39 ADMISSION DATE:  05/23/2019, CONSULTATION DATE:  05/11/2019 REFERRING MD:  [facility], CHIEF COMPLAINT:  hypoxemia   Brief History    59 year old with a 9-day history of slowly worsening Covid pneumonia despite appropriate therapy Transferred to unit, intubated 5/8  History of present illness    Patient is a 59 year old male initially admitted 3?31 to 05/30/2019 for Covid pneumonia.  He signed out AMA the evening of 05/31/2019.  Then returned the evening of 4/5 complaining of worsening shortness of breath.  Presenting saturations at that time were in the 80s and he came up into the mid 90s on 100% oxygen.  We were asked to see early this morning in regards to worsening hypoxemia.  The patient was noncompliant with his oxygen.  On high flow nasal cannula at 100% the patient had an oxygen saturation of about 79%.  With the addition of 100% facemask the patient had oxygen saturation in the mid 80s.  He was verbal throughout and did not look particularly dyspneic.  Patient was urgently transferred to the ICU and intubated.  At the time of this dictation he is ventilated on 100% with an oxygen saturation between 85 and 95%. He completed his Decadron remdesivir and Actemra.  Chest x-ray performed earlier this morning shows worsening infiltrate particularly throughout the right lung.  Past Medical History   Past Medical History:  Diagnosis Date  . Chronic neck pain 05/20/2012  . Family hx of ALS (amyotrophic lateral sclerosis) 05/27/2013  . GERD (gastroesophageal reflux disease)   . Hiatal hernia   . HLD (hyperlipidemia)   . Major depressive disorder, recurrent episode, moderate ([place]) 05/27/2013  . Neck pain    cervical spine with herniation  . Tobacco abuse   . Wrist fracture, left    Significant Hospital Events    Transferred to ICU intubation 05/11/2019 4/11-proning 4/12-tolerating supine ventilation 4/13-tolerating  PEEP wean. 4/18- trach to facilitate vent wean  Consults:  [facility], PCCM  Procedures:  Intubation 05/11/2019 Central line placement 05/11/2019 4/18 trach  Significant Diagnostic Tests:  4/6 ctpa: 1. No demonstrable pulmonary embolus. No thoracic aortic aneurysm. There are foci of aortic atherosclerosis.  2. Widespread ground-glass type opacity consistent with extensive multifocal pneumonia, slightly increased in the right middle lobe and stable albeit extensive elsewhere. Overall there is more infiltrate on the left than on the right, stable. Underlying degree of emphysema.  3.  No evident adenopathy.  4.  Stable moderate hiatal hernia.  5.  No evident adenopathy.  4/7 echo: LVEF 123456, grade I diastolic dysfunc 4/7 LE venous duplex: negative bilaterally for DVT  Micro Data:  Respiratory cultures-positive WBC, abundant Staph aureus, Haemophilus influenza Covid PCR positive 3/31 4/18 resp: staph aureus  Antimicrobials:  Cefepime 4/7-4/10 Ampicillin 4/10-4/11 Augmentin 4/12-4/16 Remdesivir-completed vanc 4/19->  Interim history/subjective:  4/19: no acute events. tachypneic on propofol and precedex. Will attempt to get off propofol today, resp cx positive for staph and febrile with elevated pct and wbc 4/18:No events, still heavily sedated for some reason.  Objective   Blood pressure 109/74, pulse (!) 103, temperature 97.8 F (36.6 C), temperature source Axillary, resp. rate (!) 36, height 5\' 10"  (1.778 m), weight 90.1 kg, SpO2 90 %.    Vent Mode: PRVC FiO2 (%):  [50 %] 50 % Set Rate:  [28 bmp] 28 bmp Vt Set:  [550 mL-5502 mL] 550 mL PEEP:  [12 cmH20] 12 cmH20 Plateau Pressure:  [25  cmH20] 25 cmH20    Intake/Output Summary (Last 24 hours) at 05/22/2019 1203 Last data filed at 05/22/2019 1000 Gross per 24 hour  Intake 2236.56 ml  Output 3525 ml  Net -1288.44 ml   Filed Weights   05/20/19 0800 05/21/19 0445 05/22/19 0412  Weight: 93.5 kg 90.8 kg  90.1 kg   Examination: GEN: appears older than stated age, chronically ill, tachypneic on vent HEENT: trach with small blood-tinged secretions, ncat, perrla, mmmp CV: RRR, ext warm PULM: coarse upper airway sounds.  GI: Soft, hypoactive BS EXT: No edema NEURO: Withdraws x 4 PSYCH: RASS -2 SKIN: No rashes    4/19 cxr with bilateral infiltrates L>>R, personally reviewed by me  Condom cath Rectal tube PIV 6-0 shiley OGT  Resolved Hospital Problem list     Assessment & Plan:   Critically ill due to acute hypoxemic respiratory failure requiring mechanical ventilation secondary to Covid pneumonia.  Superimposed MSSA and haemophilus pneumonia.   -Superimposed bacterial pneumonia treated x 5 days. -tmax 102.7 -4/18 sputum culture mod staph aureus  -Pct trending up recheck today <0.1-> 0.34 -start vanc, de-escalate based on sensitivies - holding diuresis today, bun cont to trend up - cxr stable to improved - Sedation wean ongoing -comes off precautions 4/21 . COVID-19 pneumonia Has completed course of remdesivir and dexamethasone.  Hypernatremia:  -improved -decrease  Free water to 200 q4  Prediabetes:  -a1c 6.4 -ssi  Prolonged qt:  -On ekg at admission -since on seroquel will recheck and may need to decreased vs stop  Muscular deconditioning- PT/OT consults, pt too somnolent at this time but as improves, either chair position in bed or move to chair  Best practice:  Diet: Tube feeds Pain/Anxiety/Delirium protocol (if indicated): wean Fluid status goals: even to negative as renal function allows VAP protocol (if indicated): In place DVT prophylaxis: Lovenox Central lines: none GU: condom catheter. GI prophylaxis: Protonix Glucose control: SSI Mobility: Bedrest Code Status: Full code Family Communication: called and updated wife 4/19 Disposition: ICU   The patient is critically ill with multiple organ systems failure and requires high complexity decision  making for assessment and support, frequent evaluation and titration of therapies, application of advanced monitoring technologies and extensive interpretation of multiple databases.  Critical care time 34 mins. This represents my time independent of the NP's/PA's/med student/residents time taking care of the pt. This is excluding procedures   [facility] 05/22/2019, 12:03 PM

## 2019-05-22 NOTE — Progress Notes (Signed)
Pharmacy Antibiotic Note  Matthew Eaton is a 59 y.o. male admitted on 05/07/2019 with shortness of breath.  Pharmacy has been consulted for vancomycin dosing. Pt is febrile with Tmax 102.7 and WBC is elevated at 13. Staph aureus growing in trach aspirate.   Plan: Vancomycin 2gm IV x 1 then 1g IV Q12H F/u renal fxn, C&S, clinical status and peak/trough at SS  Height: 5\' 10"  (177.8 cm) Weight: 90.1 kg (198 lb 10.2 oz) IBW/kg (Calculated) : 73  Temp (24hrs), Avg:100 F (37.8 C), Min:97.7 F (36.5 C), Max:102.7 F (39.3 C)  Recent Labs  Lab 05/17/19 0508 05/18/19 0702 05/20/19 0425 05/21/19 0612 05/22/19 0609  WBC 15.1* 13.3* 8.7 13.0* 12.6*  CREATININE 1.15 1.02 1.28* 1.25* 1.13    Estimated Creatinine Clearance: 80.4 mL/min (by C-G formula based on SCr of 1.13 mg/dL).    Allergies  Allergen Reactions  . Gabapentin Other (See Comments)    Severe tingling in the legs and weakness (of the legs)  . Methocarbamol Anaphylaxis, Shortness Of Breath and Other (See Comments)    Respiratory arrest- Turned purple    Antimicrobials this admission: Vanc 4/19>> Cefepime 4/8 >> 4/10 Amoxicillin 4/10>> 4/12 Augmentin 4/12 >> 4/17  Dose adjustments this admission: N/A  Microbiology results: 4/8 TA - abundant HFlu b-lactamase neg; few MSSA 4/8 Bld Cx - negF  4/18 TA - mod SA  Thank you for allowing pharmacy to be a part of this patient's care.  Matthew Eaton, Matthew Eaton 05/22/2019 12:22 PM

## 2019-05-22 NOTE — Progress Notes (Signed)
SLP Cancellation Note  Patient Details Name: Matthew Eaton MRN: [id number] DOB: [birth year]   Cancelled treatment:        Pt sedated on ventilator currently per RN. Not appropriate for PMV or swallow assessment. Will follow.    Houston Siren 05/22/2019, 11:48 AM   Orbie Pyo Colvin Caroli.Ed Risk analyst [phone] Office [phone]

## 2019-05-22 NOTE — Progress Notes (Signed)
Assisted tele visit to patient with wife.  Wandra Babin D Tysheena Ginzburg, RN   

## 2019-05-23 ENCOUNTER — Inpatient Hospital Stay (HOSPITAL_COMMUNITY): Payer: PPO

## 2019-05-23 LAB — GLUCOSE, CAPILLARY
Glucose-Capillary: 183 mg/dL — ABNORMAL HIGH (ref 70–99)
Glucose-Capillary: 165 mg/dL — ABNORMAL HIGH (ref 70–99)
Glucose-Capillary: 212 mg/dL — ABNORMAL HIGH (ref 70–99)
Glucose-Capillary: 115 mg/dL — ABNORMAL HIGH (ref 70–99)
Glucose-Capillary: 178 mg/dL — ABNORMAL HIGH (ref 70–99)
Glucose-Capillary: 175 mg/dL — ABNORMAL HIGH (ref 70–99)

## 2019-05-23 LAB — BASIC METABOLIC PANEL
Anion gap: 10 (ref 5–15)
BUN: 63 mg/dL — ABNORMAL HIGH (ref 6–20)
CO2: 31 mmol/L (ref 22–32)
Calcium: 9.1 mg/dL (ref 8.9–10.3)
Chloride: 103 mmol/L (ref 98–111)
Creatinine, Ser: 1.15 mg/dL (ref 0.61–1.24)
GFR calc Af Amer: >60 mL/min (ref >60)
GFR calc non Af Amer: >60 mL/min (ref >60)
Glucose, Bld: 193 mg/dL — ABNORMAL HIGH (ref 70–99)
Potassium: 2.7 mmol/L — CL (ref 3.5–5.1)
Sodium: 144 mmol/L (ref 135–145)

## 2019-05-23 LAB — CBC
HCT: 37.6 % — ABNORMAL LOW (ref 39.0–52.0)
Hemoglobin: 11.5 g/dL — ABNORMAL LOW (ref 13.0–17.0)
MCH: 30.4 pg (ref 26.0–34.0)
MCHC: 30.6 g/dL (ref 30.0–36.0)
MCV: 99.5 fL (ref 80.0–100.0)
Platelets: 211 10*3/uL (ref 150–400)
RBC: 3.78 MIL/uL — ABNORMAL LOW (ref 4.22–5.81)
RDW: 16.5 % — ABNORMAL HIGH (ref 11.5–15.5)
WBC: 11.1 10*3/uL — ABNORMAL HIGH (ref 4.0–10.5)
nRBC: 0 % (ref 0.0–0.2)

## 2019-05-23 LAB — CULTURE, RESPIRATORY W GRAM STAIN

## 2019-05-23 LAB — POTASSIUM: Potassium: 3.8 mmol/L (ref 3.5–5.1)

## 2019-05-23 LAB — PHOSPHORUS: Phosphorus: 3.5 mg/dL (ref 2.5–4.6)

## 2019-05-23 LAB — VITAMIN B12: Vitamin B-12: 891 pg/mL (ref 180–914)

## 2019-05-23 LAB — MAGNESIUM: Magnesium: 2.7 mg/dL — ABNORMAL HIGH (ref 1.7–2.4)

## 2019-05-23 LAB — FOLATE: Folate: 6.7 ng/mL (ref >5.9)

## 2019-05-23 LAB — PROCALCITONIN: Procalcitonin: 0.37 ng/mL

## 2019-05-23 MED ORDER — POTASSIUM CHLORIDE 20 MEQ/15ML (10%) PO SOLN
30.0000 meq | ORAL | Status: AC
  Administered 2019-05-23 (×2): 30 meq
  Filled 2019-05-23 (×2): qty 30

## 2019-05-23 MED ORDER — CEFAZOLIN SODIUM-DEXTROSE 2-4 GM/100ML-% IV SOLN
2.0000 g | Freq: Three times a day (TID) | INTRAVENOUS | Status: DC
  Administered 2019-05-23 – 2019-05-26 (×10): 2 g via INTRAVENOUS
  Filled 2019-05-23 (×11): qty 100

## 2019-05-23 MED ORDER — FENTANYL 2500MCG IN NS 250ML (10MCG/ML) PREMIX INFUSION
0.0000 ug/h | INTRAVENOUS | Status: DC
  Administered 2019-05-23: 400 ug/h via INTRAVENOUS
  Administered 2019-05-23: 25 ug/h via INTRAVENOUS
  Administered 2019-05-23: 400 ug/h via INTRAVENOUS
  Administered 2019-05-24: 350 ug/h via INTRAVENOUS
  Administered 2019-05-24: 400 ug/h via INTRAVENOUS
  Administered 2019-05-24: 350 ug/h via INTRAVENOUS
  Administered 2019-05-25 (×3): 300 ug/h via INTRAVENOUS
  Administered 2019-05-25: 200 ug/h via INTRAVENOUS
  Administered 2019-05-26: 250 ug/h via INTRAVENOUS
  Administered 2019-05-26: 175 ug/h via INTRAVENOUS
  Administered 2019-05-27: 300 ug/h via INTRAVENOUS
  Administered 2019-05-27: 250 ug/h via INTRAVENOUS
  Administered 2019-05-27: 300 ug/h via INTRAVENOUS
  Filled 2019-05-23 (×16): qty 250

## 2019-05-23 NOTE — Progress Notes (Signed)
PT Cancellation Note  Patient Details Name: Matthew Eaton MRN: [id number] DOB: February 22, 1960   Cancelled Treatment:    Reason Eval/Treat Not Completed: Patient not medically ready.  Will try again 4/21. 05/23/2019  Ginger Carne., PT Acute Rehabilitation Services [phone]  (pager) [phone]  (office)   Tessie Fass Wiley Flicker 05/23/2019, 5:21 PM

## 2019-05-23 NOTE — Progress Notes (Addendum)
NAME:  Carlisle Paszkiewicz, MRN:  [id number], DOB:  18-Oct-1960, LOS: 51 ADMISSION DATE:  05/22/2019, CONSULTATION DATE:  05/11/2019 REFERRING MD:  [facility], CHIEF COMPLAINT:  hypoxemia   Brief History    59 year old with a 9-day history of slowly worsening Covid pneumonia despite appropriate therapy Transferred to unit, intubated 5/8  History of present illness    Patient is a 59 year old male initially admitted 3?31 to 05/14/2019 for Covid pneumonia.  He signed out AMA the evening of 05/20/2019.  Then returned the evening of 4/5 complaining of worsening shortness of breath.  Presenting saturations at that time were in the 80s and he came up into the mid 90s on 100% oxygen.  We were asked to see early this morning in regards to worsening hypoxemia.  The patient was noncompliant with his oxygen.  On high flow nasal cannula at 100% the patient had an oxygen saturation of about 79%.  With the addition of 100% facemask the patient had oxygen saturation in the mid 80s.  He was verbal throughout and did not look particularly dyspneic.  Patient was urgently transferred to the ICU and intubated.  At the time of this dictation he is ventilated on 100% with an oxygen saturation between 85 and 95%. He completed his Decadron remdesivir and Actemra.  Chest x-ray performed earlier this morning shows worsening infiltrate particularly throughout the right lung.  Past Medical History   Past Medical History:  Diagnosis Date  . Chronic neck pain 05/20/2012  . Family hx of ALS (amyotrophic lateral sclerosis) 05/27/2013  . GERD (gastroesophageal reflux disease)   . Hiatal hernia   . HLD (hyperlipidemia)   . Major depressive disorder, recurrent episode, moderate ([place]) 05/27/2013  . Neck pain    cervical spine with herniation  . Tobacco abuse   . Wrist fracture, left    Significant Hospital Events    Transferred to ICU intubation 05/11/2019 4/11-proning 4/12-tolerating supine ventilation 4/13-tolerating  PEEP wean. 4/18- trach to facilitate vent wean  Consults:  [facility], PCCM  Procedures:  Intubation 05/11/2019 Central line placement 05/11/2019 4/18 trach  Significant Diagnostic Tests:  4/6 ctpa: 1. No demonstrable pulmonary embolus. No thoracic aortic aneurysm. There are foci of aortic atherosclerosis.  2. Widespread ground-glass type opacity consistent with extensive multifocal pneumonia, slightly increased in the right middle lobe and stable albeit extensive elsewhere. Overall there is more infiltrate on the left than on the right, stable. Underlying degree of emphysema.  3.  No evident adenopathy.  4.  Stable moderate hiatal hernia.  5.  No evident adenopathy.  4/7 echo: LVEF 123456, grade I diastolic dysfunc 4/7 LE venous duplex: negative bilaterally for DVT  Micro Data:  Respiratory cultures-positive WBC, abundant Staph aureus, Haemophilus influenza Covid PCR positive 3/31 4/18 resp: MSSA  Antimicrobials:  Cefepime 4/7-4/10 Ampicillin 4/10-4/11 Augmentin 4/12-4/16 Remdesivir-completed vanc 4/19->4/20 Cefazolin 4/20->  Interim history/subjective:  4/20: pt appears much more uncomfortable and tachypneic today. He is diaphoretic with tmax 101.1. his secretions are worse and he has more rhonchi. Overnight fentanyl appeared to work the best we have been unable to transition off propofol without it.  4/19: no acute events. tachypneic on propofol and precedex. Will attempt to get off propofol today, resp cx positive for staph and febrile with elevated pct and wbc 4/18:No events, still heavily sedated for some reason.  Objective   Blood pressure 110/60, pulse (!) 123, temperature 98 F (36.7 C), temperature source Axillary, resp. rate (!) 37, height 5\' 10"  (1.778 m), weight  89.5 kg, SpO2 96 %.    Vent Mode: PRVC FiO2 (%):  [50 %-100 %] 100 % Set Rate:  [28 bmp] 28 bmp Vt Set:  [550 mL] 550 mL PEEP:  [12 cmH20] 12 cmH20    Intake/Output Summary (Last  24 hours) at 05/23/2019 1009 Last data filed at 05/23/2019 0800 Gross per 24 hour  Intake 3415.94 ml  Output 2550 ml  Net 865.94 ml   Filed Weights   05/21/19 0445 05/22/19 0412 05/23/19 0412  Weight: 90.8 kg 90.1 kg 89.5 kg   Examination: GEN: appears older than stated age, chronically and acutely ill appearing, diaphoretic, tachypneic on vent HEENT: trach with copious thick secretions, ncat, perrla, mmmp CV: RRR, ext warm PULM: rhonchi bilaterally R>>L GI: Soft, hypoactive BS EXT: No edema NEURO: Withdraws x 4 PSYCH: RASS -2 SKIN: No rashes   4/20: CXR pending  Condom cath Rectal tube PIV 6-0 shiley OGT  Resolved Hospital Problem list     Assessment & Plan:   Critically ill due to acute hypoxemic respiratory failure requiring mechanical ventilation secondary to Covid pneumonia.  Superimposed MSSA and haemophilus pneumonia.   -Superimposed bacterial pneumonia treated x 7 days  -de-escalate to cefazolin -tmax 101.1 -4/18 sputum culture mod MSSA -Pct trending up recheck today <0.1-> 0.34->0.41->0.37 -holding diuresis today, stable indices today -cxr pending -start chest physiotherapy -Sedation titration ongoing (will add fentanyl infusion for now as we increase po pain medication. Stop propofol and utilize precedex) -comes off precautions 4/21 . COVID-19 pneumonia Has completed course of remdesivir and dexamethasone.  Hypernatremia:  -improved -Free water to 200 q4 Hypokalemia:  -replace -recheck at 1600  Prediabetes:  -a1c 6.4 -ssi  Macrocytic anemia:  -check b12 -folate  -no acute indication for transfusion and no bleeding noted  Prolonged qt:  -On ekg at admission -since on seroquel will recheck and may need to decreased vs stop -still awaiting ekg  Muscular deconditioning- PT/OT consults, pt too somnolent at this time but as improves, either chair position in bed or move to chair  Best practice:  Diet: Tube feeds Pain/Anxiety/Delirium  protocol (if indicated): wean Fluid status goals: even to negative as renal function allows VAP protocol (if indicated): In place DVT prophylaxis: Lovenox Central lines: none GU: condom catheter. GI prophylaxis: Protonix Glucose control: SSI Mobility: Bedrest Code Status: Full code Family Communication: called and updated wife 4/20 Disposition: ICU   The patient is critically ill with multiple organ systems failure and requires high complexity decision making for assessment and support, frequent evaluation and titration of therapies, application of advanced monitoring technologies and extensive interpretation of multiple databases.  Critical care time 37 mins. This represents my time independent of the NP's/PA's/med student/residents time taking care of the pt. This is excluding procedures   [facility] 05/23/2019, 10:09 AM

## 2019-05-23 NOTE — Progress Notes (Signed)
Assisted tele visit to patient with wife.  Zoella Roberti Anderson, RN   

## 2019-05-23 NOTE — Progress Notes (Signed)
Inaccurate BP

## 2019-05-23 NOTE — Progress Notes (Signed)
SLP Cancellation Note  Patient Details Name: Matthew Eaton MRN: [id number] DOB: 1960/11/22   Cancelled treatment:        Not appropriate for PMV. Vent, sedated and fever. Will continue to check on pt.   [place] Siren 05/23/2019, 8:30 AM  Orbie Pyo Colvin Caroli.Ed Risk analyst [phone] Office [phone]

## 2019-05-23 NOTE — Progress Notes (Signed)
Assisted tele visit to patient with wife.  Matthew Sisler [name], RN   

## 2019-05-24 ENCOUNTER — Inpatient Hospital Stay (HOSPITAL_COMMUNITY): Payer: PPO

## 2019-05-24 DIAGNOSIS — Z9911 Dependence on respirator [ventilator] status: Secondary | ICD-10-CM

## 2019-05-24 DIAGNOSIS — E870 Hyperosmolality and hypernatremia: Secondary | ICD-10-CM | POA: Diagnosis not present

## 2019-05-24 DIAGNOSIS — Z930 Tracheostomy status: Secondary | ICD-10-CM

## 2019-05-24 DIAGNOSIS — J9601 Acute respiratory failure with hypoxia: Secondary | ICD-10-CM | POA: Diagnosis not present

## 2019-05-24 DIAGNOSIS — D649 Anemia, unspecified: Secondary | ICD-10-CM

## 2019-05-24 DIAGNOSIS — J15211 Pneumonia due to Methicillin susceptible Staphylococcus aureus: Secondary | ICD-10-CM

## 2019-05-24 DIAGNOSIS — U071 COVID-19: Secondary | ICD-10-CM | POA: Diagnosis not present

## 2019-05-24 LAB — BASIC METABOLIC PANEL
Anion gap: 12 (ref 5–15)
BUN: 69 mg/dL — ABNORMAL HIGH (ref 6–20)
CO2: 28 mmol/L (ref 22–32)
Calcium: 8.8 mg/dL — ABNORMAL LOW (ref 8.9–10.3)
Chloride: 106 mmol/L (ref 98–111)
Creatinine, Ser: 1.08 mg/dL (ref 0.61–1.24)
GFR calc Af Amer: >60 mL/min (ref >60)
GFR calc non Af Amer: >60 mL/min (ref >60)
Glucose, Bld: 225 mg/dL — ABNORMAL HIGH (ref 70–99)
Potassium: 3.5 mmol/L (ref 3.5–5.1)
Sodium: 146 mmol/L — ABNORMAL HIGH (ref 135–145)

## 2019-05-24 LAB — CBC
HCT: 36.3 % — ABNORMAL LOW (ref 39.0–52.0)
Hemoglobin: 10.6 g/dL — ABNORMAL LOW (ref 13.0–17.0)
MCH: 29.9 pg (ref 26.0–34.0)
MCHC: 29.2 g/dL — ABNORMAL LOW (ref 30.0–36.0)
MCV: 102.3 fL — ABNORMAL HIGH (ref 80.0–100.0)
Platelets: 257 10*3/uL (ref 150–400)
RBC: 3.55 MIL/uL — ABNORMAL LOW (ref 4.22–5.81)
RDW: 16.5 % — ABNORMAL HIGH (ref 11.5–15.5)
WBC: 12.2 10*3/uL — ABNORMAL HIGH (ref 4.0–10.5)
nRBC: 0 % (ref 0.0–0.2)

## 2019-05-24 LAB — GLUCOSE, CAPILLARY
Glucose-Capillary: 206 mg/dL — ABNORMAL HIGH (ref 70–99)
Glucose-Capillary: 162 mg/dL — ABNORMAL HIGH (ref 70–99)
Glucose-Capillary: 160 mg/dL — ABNORMAL HIGH (ref 70–99)
Glucose-Capillary: 162 mg/dL — ABNORMAL HIGH (ref 70–99)
Glucose-Capillary: 147 mg/dL — ABNORMAL HIGH (ref 70–99)
Glucose-Capillary: 154 mg/dL — ABNORMAL HIGH (ref 70–99)

## 2019-05-24 LAB — MAGNESIUM: Magnesium: 2.8 mg/dL — ABNORMAL HIGH (ref 1.7–2.4)

## 2019-05-24 LAB — PHOSPHORUS: Phosphorus: 4 mg/dL (ref 2.5–4.6)

## 2019-05-24 LAB — TRIGLYCERIDES: Triglycerides: 418 mg/dL — ABNORMAL HIGH (ref <150)

## 2019-05-24 MED ORDER — LIP MEDEX EX OINT
TOPICAL_OINTMENT | CUTANEOUS | Status: DC | PRN
  Filled 2019-05-24: qty 7

## 2019-05-24 MED ORDER — INSULIN GLARGINE 100 UNIT/ML ~~LOC~~ SOLN
10.0000 [IU] | Freq: Every day | SUBCUTANEOUS | Status: DC
  Administered 2019-05-24 – 2019-05-26 (×3): 10 [IU] via SUBCUTANEOUS
  Filled 2019-05-24 (×4): qty 0.1

## 2019-05-24 MED ORDER — FUROSEMIDE 10 MG/ML IJ SOLN
40.0000 mg | Freq: Once | INTRAMUSCULAR | Status: AC
  Administered 2019-05-24: 40 mg via INTRAVENOUS
  Filled 2019-05-24: qty 4

## 2019-05-24 MED ORDER — FREE WATER
250.0000 mL | Status: DC
  Administered 2019-05-24 – 2019-06-03 (×56): 250 mL

## 2019-05-24 MED ORDER — POTASSIUM CHLORIDE 20 MEQ/15ML (10%) PO SOLN
30.0000 meq | ORAL | Status: AC
  Administered 2019-05-24 (×2): 30 meq
  Filled 2019-05-24 (×2): qty 30

## 2019-05-24 MED ORDER — FOLIC ACID 1 MG PO TABS
1.0000 mg | ORAL_TABLET | Freq: Every day | ORAL | Status: DC
  Administered 2019-05-24 – 2019-06-05 (×13): 1 mg
  Filled 2019-05-24 (×13): qty 1

## 2019-05-24 NOTE — Progress Notes (Signed)
NAME:  Matthew Eaton, MRN:  [id number], DOB:  Nov 07, 1960, LOS: 51 ADMISSION DATE:  05/28/2019, CONSULTATION DATE:  05/11/2019 REFERRING MD:  [facility], CHIEF COMPLAINT:  hypoxemia   Brief History    59 year old with a 9-day history of slowly worsening Covid pneumonia despite appropriate therapy Transferred to unit, intubated 5/8  History of present illness    Patient is a 59 year old male initially admitted 3/31 to 05/11/2019 for Covid pneumonia.  He signed out AMA the evening of 05/07/2019.  Then returned the evening of 4/5 complaining of worsening shortness of breath.  Presenting saturations at that time were in the 80s and he came up into the mid 90s on 100% oxygen.  We were asked to see early this morning in regards to worsening hypoxemia.  The patient was noncompliant with his oxygen.  On high flow nasal cannula at 100% the patient had an oxygen saturation of about 79%.  With the addition of 100% facemask the patient had oxygen saturation in the mid 80s.  He was verbal throughout and did not look particularly dyspneic.  Patient was urgently transferred to the ICU and intubated.  At the time of this dictation he is ventilated on 100% with an oxygen saturation between 85 and 95%. He completed his Decadron, remdesivir, and Actemra.  Chest x-ray performed earlier this morning shows worsening infiltrate particularly throughout the right lung.  Past Medical History   Past Medical History:  Diagnosis Date  . Chronic neck pain 05/20/2012  . Family hx of ALS (amyotrophic lateral sclerosis) 05/27/2013  . GERD (gastroesophageal reflux disease)   . Hiatal hernia   . HLD (hyperlipidemia)   . Major depressive disorder, recurrent episode, moderate ([place]) 05/27/2013  . Neck pain    cervical spine with herniation  . Tobacco abuse   . Wrist fracture, left    Significant Hospital Events    Transferred to ICU intubation 05/11/2019 4/11-proning 4/12-tolerating supine ventilation 4/13-tolerating  PEEP wean. 4/18- trach to facilitate vent wean  Consults:  [facility], PCCM  Procedures:  Intubation 05/11/2019 Central line placement 05/11/2019 4/18 trach  Significant Diagnostic Tests:  4/6 ctpa: 1. No demonstrable pulmonary embolus. No thoracic aortic aneurysm. There are foci of aortic atherosclerosis.  2. Widespread ground-glass type opacity consistent with extensive multifocal pneumonia, slightly increased in the right middle lobe and stable albeit extensive elsewhere. Overall there is more infiltrate on the left than on the right, stable. Underlying degree of emphysema.  3.  No evident adenopathy.  4.  Stable moderate hiatal hernia.  5.  No evident adenopathy.  4/7 echo: LVEF 123456, grade I diastolic dysfunc 4/7 LE venous duplex: negative bilaterally for DVT  Micro Data:  4/8 Respiratory cultures-abundant MSSA, Haemophilus influenzae Covid PCR positive 3/31 4/18 resp: MSSA  Antimicrobials:  Cefepime 4/7-4/10 Ampicillin 4/10-4/11 Augmentin 4/12-4/16 Remdesivir-completed vanc 4/19->4/20 Cefazolin 4/20->  Interim history/subjective:  4/21:slowly decreasing fentanyl this morning, had increased air hunger without it yesterday. 4/20: pt appears much more uncomfortable and tachypneic today. He is diaphoretic with tmax 101.1. his secretions are worse and he has more rhonchi. Overnight fentanyl appeared to work the best we have been unable to transition off propofol without it.  4/19: no acute events. tachypneic on propofol and precedex. Will attempt to get off propofol today, resp cx positive for staph and febrile with elevated pct and wbc 4/18:No events, still heavily sedated for some reason.  Objective   Blood pressure (!) 89/61, pulse 78, temperature 98.6 F (37 C), resp. rate Marland Kitchen)  24, height 5\' 10"  (1.778 m), weight 92.1 kg, SpO2 98 %.    Vent Mode: PRVC FiO2 (%):  [70 %-80 %] 70 % Set Rate:  [28 bmp] 28 bmp Vt Set:  [550 mL] 550 mL PEEP:  [12 cmH20]  12 cmH20 Plateau Pressure:  [21 cmH20-30 cmH20] 30 cmH20    Intake/Output Summary (Last 24 hours) at 05/24/2019 0932 Last data filed at 05/24/2019 0800 Gross per 24 hour  Intake 5064.47 ml  Output 2194 ml  Net 2870.47 ml   Filed Weights   05/22/19 0412 05/23/19 0412 05/24/19 0428  Weight: 90.1 kg 89.5 kg 92.1 kg   Examination:  GEN: Elderly man on mechanical ventilation, sedated HEENT: [place]/AT, eyes anicteric, oral mucosa moist.  Pupils reactive. Neck: Trach in place with dried blood on the bandage.  Intermittent audible cuff leak. CV: Regular rate and rhythm, no murmurs PULM: Rhonchi bilaterally cleared with suctioning.  Copious tan secretions from ET tube.  Tachypneic, mild vent dyssynchrony.  Pplateau 27 GI: Soft, nontender, nondistended. EXT: No cyanosis or edema.  Extremities cool. NEURO: Eyes open spontaneously, but not tracking.  Resisting oral care.  Not following commands. PSYCH: RASS 0, CAM+ SKIN: No rashes or wounds   4/21 CXR personally reviewed-trach in place, improving right sided opacity, persistent left-sided opacities.  Condom cath Rectal tube PIVs 6-0 shiley cuffed NGT  Resolved Hospital Problem list     Assessment & Plan:   Acute hypoxemic respiratory failure requiring mechanical ventilation secondary to Covid pneumonia.  Superimposed MSSA and haemophilus pneumonia.  MSSA persistent. -Previously completed 9 days of antibiotics. Restarted on 4/19 for persistent MSSA.  Continue cefazolin. -Continue chest PT every 4 hours -Continue full vent support, 4-8 cc/kg ideal body weight with goal plateau less than 30 and driving pressure less than 15.  Titrate PEEP and FiO2 per ARDS ladder.  Tolerating vent dyssynchrony. -Continue sedation as required to tolerate mechanical ventilation.  Continue Precedex and fentanyl. -Stable to come off covid precautions 4/21 -VAP prevention protocol . COVID-19 pneumonia -Previously completed remdesivir and  dexamethasone  Hypernatremia:  -Increase free water flushes to 250 cc every 4 hours -Continue to monitor  Hypokalemia, resolved -Additional repletion today -Continue to monitor  Prediabetes, A1c 6.4 -Continue Accu-Cheks every 4 hours with sliding scale insulin as needed -Adding Lantus 10 units daily -Goal BG 140-180 while admitted to the ICU  Macrocytic anemia, normal B12 - folate -Transfuse for hemoglobin less than 7, no current indication. -Continue to monitor  Prolonged QTc at admission -EKG today -We will adjust Seroquel dosing as needed  Muscular deconditioning- PT/OT consults, pt remains too somnolent at this time but as improves, either chair position in bed or move to chair  Best practice:  Diet: Tube feeds Pain/Anxiety/Delirium protocol (if indicated): wean Fluid status goals: even to negative as renal function allows VAP protocol (if indicated): In place DVT prophylaxis: Lovenox Central lines: none GU: condom catheter. GI prophylaxis: Protonix Glucose control: SSI Mobility: Bedrest Code Status: Full code Family Communication: We will update family this afternoon Disposition: ICU  This patient is critically ill with multiple organ system failure which requires frequent high complexity decision making, assessment, support, evaluation, and titration of therapies. This was completed through the application of advanced monitoring technologies and extensive interpretation of multiple databases. During this encounter critical care time was devoted to patient care services described in this note for 40 minutes.   Julian Hy, DO 05/24/19 10:05 AM [facility]

## 2019-05-24 NOTE — Progress Notes (Signed)
Assisted tele visit to patient with wife.  Pleasant Britz Anderson, RN   

## 2019-05-24 NOTE — Progress Notes (Signed)
Assisted tele visit to patient with wife.  Denilson Salminen D Mickenzie Stolar, RN   

## 2019-05-24 NOTE — Progress Notes (Signed)
PT Cancellation Note  Patient Details Name: Matthew Eaton MRN: [id number] DOB: 1960/08/04   Cancelled Treatment:    Reason Eval/Treat Not Completed: Medical issues which prohibited therapy.  RN doesn't see signs that he is imminently ready for therapies.  Will sign off again and await reorder when mobility (sitting EOB) or OOB is more appropriate. 05/24/2019  Ginger Carne., PT Acute Rehabilitation Services [phone]  (pager) [phone]  (office)   Tessie Fass Sheika Coutts 05/24/2019, 12:27 PM

## 2019-05-24 NOTE — Plan of Care (Signed)
I called Mr. Phillip's wife Joelene Millin to provide an update. All questions were answered.  Julian Hy, DO 05/24/19 2:49 PM [facility]

## 2019-05-25 LAB — CBC
HCT: 32 % — ABNORMAL LOW (ref 39.0–52.0)
Hemoglobin: 9.4 g/dL — ABNORMAL LOW (ref 13.0–17.0)
MCH: 30.2 pg (ref 26.0–34.0)
MCHC: 29.4 g/dL — ABNORMAL LOW (ref 30.0–36.0)
MCV: 102.9 fL — ABNORMAL HIGH (ref 80.0–100.0)
Platelets: 290 10*3/uL (ref 150–400)
RBC: 3.11 MIL/uL — ABNORMAL LOW (ref 4.22–5.81)
RDW: 16.4 % — ABNORMAL HIGH (ref 11.5–15.5)
WBC: 11.5 10*3/uL — ABNORMAL HIGH (ref 4.0–10.5)
nRBC: 0.2 % (ref 0.0–0.2)

## 2019-05-25 LAB — BASIC METABOLIC PANEL
Anion gap: 7 (ref 5–15)
BUN: 70 mg/dL — ABNORMAL HIGH (ref 6–20)
CO2: 30 mmol/L (ref 22–32)
Calcium: 8.8 mg/dL — ABNORMAL LOW (ref 8.9–10.3)
Chloride: 108 mmol/L (ref 98–111)
Creatinine, Ser: 1.04 mg/dL (ref 0.61–1.24)
GFR calc Af Amer: >60 mL/min (ref >60)
GFR calc non Af Amer: >60 mL/min (ref >60)
Glucose, Bld: 174 mg/dL — ABNORMAL HIGH (ref 70–99)
Potassium: 3.9 mmol/L (ref 3.5–5.1)
Sodium: 145 mmol/L (ref 135–145)

## 2019-05-25 LAB — TRIGLYCERIDES: Triglycerides: 295 mg/dL — ABNORMAL HIGH (ref <150)

## 2019-05-25 LAB — GLUCOSE, CAPILLARY
Glucose-Capillary: 171 mg/dL — ABNORMAL HIGH (ref 70–99)
Glucose-Capillary: 155 mg/dL — ABNORMAL HIGH (ref 70–99)
Glucose-Capillary: 163 mg/dL — ABNORMAL HIGH (ref 70–99)
Glucose-Capillary: 177 mg/dL — ABNORMAL HIGH (ref 70–99)
Glucose-Capillary: 152 mg/dL — ABNORMAL HIGH (ref 70–99)
Glucose-Capillary: 235 mg/dL — ABNORMAL HIGH (ref 70–99)

## 2019-05-25 LAB — PHOSPHORUS: Phosphorus: 3.5 mg/dL (ref 2.5–4.6)

## 2019-05-25 LAB — MAGNESIUM: Magnesium: 2.8 mg/dL — ABNORMAL HIGH (ref 1.7–2.4)

## 2019-05-25 MED ORDER — OXYCODONE HCL 5 MG/5ML PO SOLN
7.5000 mg | Freq: Four times a day (QID) | ORAL | Status: DC
  Administered 2019-05-25 (×3): 7.5 mg via ORAL
  Filled 2019-05-25 (×3): qty 10

## 2019-05-25 MED ORDER — FUROSEMIDE 10 MG/ML IJ SOLN
20.0000 mg | Freq: Once | INTRAMUSCULAR | Status: AC
  Administered 2019-05-25: 20 mg via INTRAVENOUS
  Filled 2019-05-25: qty 2

## 2019-05-25 MED ORDER — PROPOFOL 1000 MG/100ML IV EMUL
5.0000 ug/kg/min | INTRAVENOUS | Status: DC
  Administered 2019-05-25: 5 ug/kg/min via INTRAVENOUS
  Administered 2019-05-26: 30 ug/kg/min via INTRAVENOUS
  Administered 2019-05-26: 15 ug/kg/min via INTRAVENOUS
  Administered 2019-05-27: 30 ug/kg/min via INTRAVENOUS
  Administered 2019-05-27 (×3): 25 ug/kg/min via INTRAVENOUS
  Administered 2019-05-28 (×2): 35 ug/kg/min via INTRAVENOUS
  Administered 2019-05-28 – 2019-05-29 (×3): 30 ug/kg/min via INTRAVENOUS
  Filled 2019-05-25 (×15): qty 100

## 2019-05-25 NOTE — Progress Notes (Signed)
Nutrition Follow-up  DOCUMENTATION CODES:   Obesity unspecified  INTERVENTION:   Tube Feeding: Vital 1.5 at 60 m/hr Pro-Stat 30 mL QID Provides 2560 kcals, 157 g of protein and 1094 mL of free water  Total free water with TF plus free water flushes: 2594 mL of free water   NUTRITION DIAGNOSIS:   Increased nutrient needs related to catabolic illness(Covid) as evidenced by estimated needs.  Being addressed via TF   GOAL:   Patient will meet greater than or equal to 90% of their needs  Progressing  MONITOR:   Vent status, TF tolerance, Labs, Weight trends, Skin  REASON FOR ASSESSMENT:   Ventilator    ASSESSMENT:   Pt initially admitted for acute hypoxic respiratory failure due to COVID-19 PNA 3/31-4/5 and signed out AMA. Pt returned later on 4/5 C/O worsening hypoxemia and was re-admitted. PMH includes GERD, hiatal hernia, HLD, depression, and chronic pain.  4/08 Intubated 4/09 Cortrak placed 4/17 Trach placed  Pt remains on vent support, sedated with fentanyl and precedex  Vital 1.5 at 60 ml/hr, Pro-Stat 30 mL QID, free water flush 250 mL q 4 hours  Hypernatremia improved with free water flushes, sodium 145 (wdl) today  Current weight 94.2 kg; weight of 93.8 kg on 4/9, lowest weight of 90.1 kg on 4/19. Net + 2L per I/o flow sheet  Labs: CBGs 147-171 (ICU goal 140-180) Meds: ss novolog, lantus, folic acid, questran  Diet Order:   Diet Order            Diet NPO time specified  Diet effective midnight              EDUCATION NEEDS:   Not appropriate for education at this time  Skin:  Skin Assessment: Skin Integrity Issues: Skin Integrity Issues:: Stage I Stage I: buttock  Last BM:  4/22  Height:   Ht Readings from Last 1 Encounters:  05/12/2019 5\' 10"  (1.778 m)    Weight:   Wt Readings from Last 1 Encounters:  05/25/19 94.2 kg    Ideal Body Weight:  75.45 kg  BMI:  Body mass index is 29.8 kg/m.  Estimated Nutritional Needs:    Kcal:  [id number]  Protein:  150-188 grams  Fluid:  >2L   Kerman Passey MS, RDN, LDN, CNSC RD Pager Number and Weekend/On-Call After Hours Pager Located in [place]

## 2019-05-25 NOTE — Progress Notes (Addendum)
NAME:  Matthew Eaton, MRN:  [id number], DOB:  03/11/1960, LOS: 73 ADMISSION DATE:  05/17/2019, CONSULTATION DATE:  05/11/2019 REFERRING MD:  [facility], CHIEF COMPLAINT:  hypoxemia   Brief History    59 year old with a 9-day history of slowly worsening Covid pneumonia despite appropriate therapy Transferred to unit, intubated 5/8  History of present illness    Patient is a 59 year old male initially admitted 3/31 to 05/06/2019 for Covid pneumonia.  He signed out AMA the evening of 05/14/2019.  Then returned the evening of 4/5 complaining of worsening shortness of breath.  Presenting saturations at that time were in the 80s and he came up into the mid 90s on 100% oxygen.  We were asked to see early this morning in regards to worsening hypoxemia.  The patient was noncompliant with his oxygen.  On high flow nasal cannula at 100% the patient had an oxygen saturation of about 79%.  With the addition of 100% facemask the patient had oxygen saturation in the mid 80s.  He was verbal throughout and did not look particularly dyspneic.  Patient was urgently transferred to the ICU and intubated.  At the time of this dictation he is ventilated on 100% with an oxygen saturation between 85 and 95%. He completed his Decadron, remdesivir, and Actemra.  Chest x-ray performed earlier this morning shows worsening infiltrate particularly throughout the right lung.  Past Medical History   Past Medical History:  Diagnosis Date  . Chronic neck pain 05/20/2012  . Family hx of ALS (amyotrophic lateral sclerosis) 05/27/2013  . GERD (gastroesophageal reflux disease)   . Hiatal hernia   . HLD (hyperlipidemia)   . Major depressive disorder, recurrent episode, moderate ([place]) 05/27/2013  . Neck pain    cervical spine with herniation  . Tobacco abuse   . Wrist fracture, left    Significant Hospital Events    Transferred to ICU intubation 05/11/2019 4/11-proning 4/12-tolerating supine ventilation 4/13-tolerating  PEEP wean. 4/18- trach to facilitate vent wean 4/22: ok to come off covid precautions  Consults:  [facility], PCCM  Procedures:  Intubation 05/11/2019 Central line placement 05/11/2019 4/18 trach  Significant Diagnostic Tests:  4/6 ctpa: 1. No demonstrable pulmonary embolus. No thoracic aortic aneurysm. There are foci of aortic atherosclerosis.  2. Widespread ground-glass type opacity consistent with extensive multifocal pneumonia, slightly increased in the right middle lobe and stable albeit extensive elsewhere. Overall there is more infiltrate on the left than on the right, stable. Underlying degree of emphysema.  3.  No evident adenopathy.  4.  Stable moderate hiatal hernia.  5.  No evident adenopathy.  4/7 echo: LVEF 123456, grade I diastolic dysfunc 4/7 LE venous duplex: negative bilaterally for DVT  Micro Data:  4/8 Respiratory cultures-abundant MSSA, Haemophilus influenzae Covid PCR positive 3/31 4/18 resp: MSSA  Antimicrobials:  Cefepime 4/7-4/10 Ampicillin 4/10-4/11 Augmentin 4/12-4/16 Remdesivir-completed vanc 4/19->4/20 Cefazolin 4/20->  Interim history/subjective:  4/22: increasing po oxy in attempt to wean fentanyl. Appears comfortable but rass -4 which is not helping vent wean. Ok to come off precautions will attempt to transfer out to allow liberalization of visitation.  4/21:slowly decreasing fentanyl this morning, had increased air hunger without it yesterday. 4/20: pt appears much more uncomfortable and tachypneic today. He is diaphoretic with tmax 101.1. his secretions are worse and he has more rhonchi. Overnight fentanyl appeared to work the best we have been unable to transition off propofol without it.  4/19: no acute events. tachypneic on propofol and precedex. Will attempt  to get off propofol today, resp cx positive for staph and febrile with elevated pct and wbc 4/18:No events, still heavily sedated for some reason.  Objective     Blood pressure 114/63, pulse 84, temperature 99.8 F (37.7 C), temperature source Axillary, resp. rate (!) 33, height 5\' 10"  (1.778 m), weight 94.2 kg, SpO2 90 %.    Vent Mode: PRVC FiO2 (%):  [50 %-70 %] 60 % Set Rate:  [28 bmp] 28 bmp Vt Set:  [550 mL] 550 mL PEEP:  [12 cmH20] 12 cmH20 Plateau Pressure:  [24 cmH20-30 cmH20] 30 cmH20    Intake/Output Summary (Last 24 hours) at 05/25/2019 1024 Last data filed at 05/25/2019 0800 Gross per 24 hour  Intake 3347.66 ml  Output 2300 ml  Net 1047.66 ml   Filed Weights   05/23/19 0412 05/24/19 0428 05/25/19 0452  Weight: 89.5 kg 92.1 kg 94.2 kg   Examination:  GEN: Elderly man on mechanical ventilation, sedated HEENT: [place]/AT, eyes anicteric, oral mucosa moist.  Pupils reactive. Neck: Trach in place with dried blood on the bandage.  Intermittent audible cuff leak. CV: Regular rate and rhythm, no murmurs PULM: diminished bilaterally, Copious tan secretions from ET tube.  Tachypneic, mild vent dyssynchrony.  Pplateau 27 GI: Soft, nontender, nondistended. EXT: No cyanosis or edema.  Extremities cool. NEURO: Eyes not opening to verbal or tactile stim  Resisting oral care.  Not following commands. PSYCH: RASS 0, CAM+ SKIN: No rashes or wounds   4/21 CXR personally reviewed-trach in place, improving right sided opacity, persistent to worsening left-sided opacities.  Condom cath Rectal tube PIVs 6-0 shiley cuffed NGT  Resolved Hospital Problem list     Assessment & Plan:   Acute hypoxemic respiratory failure requiring mechanical ventilation secondary to Covid pneumonia.  Superimposed MSSA and haemophilus pneumonia.  MSSA persistent. -Previously completed 9 days of antibiotics. Restarted on 4/19 for persistent MSSA.  Continue cefazolin for 7 days -Continue chest PT every 4 hours -Continue full vent support, 4-8 cc/kg ideal body weight with goal plateau less than 30 and driving pressure less than 15.  Titrate PEEP and FiO2 per ARDS  ladder.  Tolerating vent dyssynchrony. -Continue sedation as required to tolerate mechanical ventilation.  Continue Precedex and fentanyl. -increased po oxy to wean fentanyl  -Stable to come off covid precautions 4/21 -VAP prevention protocol  Acute encephalopathy, multifactorial metabolic/hypoxic resp failure:  -challenging to control with desats and tachypnea when fentanyl or precedex weaned.  -limited on seroquel by qt prolongation -not making much progress on this -will increase po oxy at this time.  Marland Kitchen COVID-19 pneumonia -Previously completed remdesivir and dexamethasone -off precautions as of 4/21  Hypernatremia:  -improved -cont free water flushes to 250 cc every 4 hours -Continue to monitor   Prediabetes, A1c 6.4 -Continue Accu-Cheks every 4 hours with sliding scale insulin as needed -cont Lantus 10 units daily -Goal BG 140-180 while admitted to the ICU  Macrocytic anemia, normal B12 - folate -Transfuse for hemoglobin less than 7, no current indication. -Continue to monitor  Prolonged QTc at admission -EKG 4/21 with qtc in 470's -maintain seroquel as is for now with close monitoring.   Muscular deconditioning- PT/OT consults, pt remains too somnolent at this time but as improves, either chair position in bed or move to chair  Best practice:  Diet: Tube feeds Pain/Anxiety/Delirium protocol (if indicated): wean Fluid status goals: even to negative as renal function allows VAP protocol (if indicated): In place DVT prophylaxis: Lovenox Central lines: none GU:  condom catheter. GI prophylaxis: Protonix Glucose control: SSI Mobility: Bedrest Code Status: Full code Family Communication: updated wife via phone 4/22 Disposition: ICU but transfer out of 22M with being outside 21 day mark today for family to visit.   This patient is critically ill with multiple organ system failure which requires frequent high complexity decision making, assessment, support, evaluation,  and titration of therapies. This was completed through the application of advanced monitoring technologies and extensive interpretation of multiple databases. During this encounter critical care time was devoted to patient care services described in this note for 38 minutes.   Audria Nine, DO 05/25/19 10:24 AM [facility]

## 2019-05-25 NOTE — Progress Notes (Signed)
SLP Cancellation Note  Patient Details Name: Matthew Eaton MRN: [id number] DOB: 04-28-1960   Cancelled treatment:    Pt not appropriate for PMV trials at this time.  Will follow along for readiness.  Matthew Eaton, Matthew Eaton CCC/SLP Acute Rehabilitation Services Office number [phone] Pager [phone]        Matthew Eaton 05/25/2019, 9:34 AM

## 2019-05-25 NOTE — Progress Notes (Signed)
Assisted tele visit to patient with wife.  Matthew Eaton, Phoua Hoadley Renee, RN   

## 2019-05-25 NOTE — Progress Notes (Signed)
eLink Physician-Brief Progress Note Patient Name: Matthew Eaton DOB: [birth year] MRN: [id number]   Date of Service  05/25/2019  HPI/Events of Note  Pt with sub-optimal sedation and ventilator dyssynchrony on maximum infusion rates of Precedex and Fentanyl.  eICU Interventions  Precedex discontinued and Propofol substituted.        [name] 05/25/2019, 9:07 PM

## 2019-05-26 ENCOUNTER — Inpatient Hospital Stay (HOSPITAL_COMMUNITY): Payer: PPO

## 2019-05-26 DIAGNOSIS — Y95 Nosocomial condition: Secondary | ICD-10-CM

## 2019-05-26 DIAGNOSIS — J80 Acute respiratory distress syndrome: Secondary | ICD-10-CM

## 2019-05-26 DIAGNOSIS — J189 Pneumonia, unspecified organism: Secondary | ICD-10-CM

## 2019-05-26 LAB — POCT I-STAT 7, (LYTES, BLD GAS, ICA,H+H)
Acid-Base Excess: 3 mmol/L — ABNORMAL HIGH (ref 0.0–2.0)
Bicarbonate: 28.1 mmol/L — ABNORMAL HIGH (ref 20.0–28.0)
Calcium, Ion: 1.34 mmol/L (ref 1.15–1.40)
HCT: 31 % — ABNORMAL LOW (ref 39.0–52.0)
Hemoglobin: 10.5 g/dL — ABNORMAL LOW (ref 13.0–17.0)
O2 Saturation: 95 %
Patient temperature: 98.5
Potassium: 3.8 mmol/L (ref 3.5–5.1)
Sodium: 144 mmol/L (ref 135–145)
TCO2: 29 mmol/L (ref 22–32)
pCO2 arterial: 45.6 mmHg (ref 32.0–48.0)
pH, Arterial: 7.396 (ref 7.350–7.450)
pO2, Arterial: 75 mmHg — ABNORMAL LOW (ref 83.0–108.0)

## 2019-05-26 LAB — GLUCOSE, CAPILLARY
Glucose-Capillary: 181 mg/dL — ABNORMAL HIGH (ref 70–99)
Glucose-Capillary: 208 mg/dL — ABNORMAL HIGH (ref 70–99)
Glucose-Capillary: 214 mg/dL — ABNORMAL HIGH (ref 70–99)
Glucose-Capillary: 235 mg/dL — ABNORMAL HIGH (ref 70–99)
Glucose-Capillary: 239 mg/dL — ABNORMAL HIGH (ref 70–99)
Glucose-Capillary: 250 mg/dL — ABNORMAL HIGH (ref 70–99)

## 2019-05-26 LAB — COMPREHENSIVE METABOLIC PANEL
ALT: 78 U/L — ABNORMAL HIGH (ref 0–44)
AST: 109 U/L — ABNORMAL HIGH (ref 15–41)
Albumin: 1.3 g/dL — ABNORMAL LOW (ref 3.5–5.0)
Alkaline Phosphatase: 226 U/L — ABNORMAL HIGH (ref 38–126)
Anion gap: 9 (ref 5–15)
BUN: 56 mg/dL — ABNORMAL HIGH (ref 6–20)
CO2: 28 mmol/L (ref 22–32)
Calcium: 8.8 mg/dL — ABNORMAL LOW (ref 8.9–10.3)
Chloride: 108 mmol/L (ref 98–111)
Creatinine, Ser: 0.9 mg/dL (ref 0.61–1.24)
GFR calc Af Amer: >60 mL/min (ref >60)
GFR calc non Af Amer: >60 mL/min (ref >60)
Glucose, Bld: 256 mg/dL — ABNORMAL HIGH (ref 70–99)
Potassium: 3.5 mmol/L (ref 3.5–5.1)
Sodium: 145 mmol/L (ref 135–145)
Total Bilirubin: 0.4 mg/dL (ref 0.3–1.2)
Total Protein: 5.8 g/dL — ABNORMAL LOW (ref 6.5–8.1)

## 2019-05-26 LAB — CBC WITH DIFFERENTIAL/PLATELET
Abs Immature Granulocytes: 0.92 10*3/uL — ABNORMAL HIGH (ref 0.00–0.07)
Basophils Absolute: 0.1 10*3/uL (ref 0.0–0.1)
Basophils Relative: 0 %
Eosinophils Absolute: 0.2 10*3/uL (ref 0.0–0.5)
Eosinophils Relative: 1 %
HCT: 25.7 % — ABNORMAL LOW (ref 39.0–52.0)
Hemoglobin: 7.3 g/dL — ABNORMAL LOW (ref 13.0–17.0)
Immature Granulocytes: 5 %
Lymphocytes Relative: 11 %
Lymphs Abs: 2.1 10*3/uL (ref 0.7–4.0)
MCH: 30.4 pg (ref 26.0–34.0)
MCHC: 28.4 g/dL — ABNORMAL LOW (ref 30.0–36.0)
MCV: 107.1 fL — ABNORMAL HIGH (ref 80.0–100.0)
Monocytes Absolute: 0.7 10*3/uL (ref 0.1–1.0)
Monocytes Relative: 4 %
Neutro Abs: 15.1 10*3/uL — ABNORMAL HIGH (ref 1.7–7.7)
Neutrophils Relative %: 79 %
Platelets: 525 10*3/uL — ABNORMAL HIGH (ref 150–400)
RBC: 2.4 MIL/uL — ABNORMAL LOW (ref 4.22–5.81)
RDW: 16.3 % — ABNORMAL HIGH (ref 11.5–15.5)
WBC: 19.2 10*3/uL — ABNORMAL HIGH (ref 4.0–10.5)
nRBC: 0.3 % — ABNORMAL HIGH (ref 0.0–0.2)

## 2019-05-26 LAB — AMMONIA: Ammonia: 25 umol/L (ref 9–35)

## 2019-05-26 MED ORDER — CLONAZEPAM 0.1 MG/ML ORAL SUSPENSION
1.0000 mg | Freq: Two times a day (BID) | ORAL | Status: DC
  Filled 2019-05-26 (×2): qty 10

## 2019-05-26 MED ORDER — PIPERACILLIN-TAZOBACTAM 3.375 G IVPB 30 MIN
3.3750 g | Freq: Once | INTRAVENOUS | Status: AC
  Administered 2019-05-26: 3.375 g via INTRAVENOUS
  Filled 2019-05-26: qty 50

## 2019-05-26 MED ORDER — QUETIAPINE FUMARATE 50 MG PO TABS
50.0000 mg | ORAL_TABLET | Freq: Two times a day (BID) | ORAL | Status: DC
  Administered 2019-05-26: 50 mg
  Filled 2019-05-26: qty 1

## 2019-05-26 MED ORDER — OXYCODONE HCL 5 MG/5ML PO SOLN
7.5000 mg | Freq: Four times a day (QID) | ORAL | Status: DC
  Administered 2019-05-26: 7.5 mg
  Filled 2019-05-26: qty 10

## 2019-05-26 MED ORDER — PIPERACILLIN-TAZOBACTAM 3.375 G IVPB
3.3750 g | Freq: Three times a day (TID) | INTRAVENOUS | Status: DC
  Administered 2019-05-26 – 2019-05-28 (×7): 3.375 g via INTRAVENOUS
  Filled 2019-05-26 (×9): qty 50

## 2019-05-26 MED ORDER — FENTANYL BOLUS VIA INFUSION
50.0000 ug | INTRAVENOUS | Status: DC | PRN
  Administered 2019-05-28 – 2019-05-29 (×2): 50 ug via INTRAVENOUS
  Administered 2019-05-30: 100 ug via INTRAVENOUS
  Administered 2019-05-31 (×2): 50 ug via INTRAVENOUS
  Filled 2019-05-26: qty 50

## 2019-05-26 MED ORDER — QUETIAPINE FUMARATE 50 MG PO TABS
100.0000 mg | ORAL_TABLET | Freq: Two times a day (BID) | ORAL | Status: DC
  Administered 2019-05-26 – 2019-05-28 (×6): 100 mg
  Filled 2019-05-26 (×6): qty 2

## 2019-05-26 MED ORDER — FENTANYL CITRATE (PF) 100 MCG/2ML IJ SOLN: 50.0000 ug | INTRAMUSCULAR | Status: DC | PRN

## 2019-05-26 MED ORDER — FUROSEMIDE 10 MG/ML IJ SOLN
40.0000 mg | Freq: Two times a day (BID) | INTRAMUSCULAR | Status: DC
  Administered 2019-05-26 (×2): 40 mg via INTRAVENOUS
  Filled 2019-05-26 (×2): qty 4

## 2019-05-26 MED ORDER — QUETIAPINE FUMARATE 50 MG PO TABS: 100.0000 mg | ORAL_TABLET | Freq: Every day | ORAL | Status: DC

## 2019-05-26 MED ORDER — CLONAZEPAM 1 MG PO TABS
1.0000 mg | ORAL_TABLET | Freq: Two times a day (BID) | ORAL | Status: DC
  Administered 2019-05-26 – 2019-05-28 (×6): 1 mg
  Filled 2019-05-26 (×6): qty 1

## 2019-05-26 MED ORDER — OXYCODONE HCL 5 MG/5ML PO SOLN
10.0000 mg | Freq: Four times a day (QID) | ORAL | Status: DC
  Administered 2019-05-26 – 2019-06-06 (×44): 10 mg
  Filled 2019-05-26 (×44): qty 10

## 2019-05-26 MED ORDER — POTASSIUM CHLORIDE 20 MEQ/15ML (10%) PO SOLN
40.0000 meq | Freq: Once | ORAL | Status: AC
  Administered 2019-05-26: 40 meq
  Filled 2019-05-26: qty 30

## 2019-05-26 NOTE — Progress Notes (Signed)
Inpatient Diabetes Program Recommendations  AACE/ADA: New Consensus Statement on Inpatient Glycemic Control   Target Ranges:  Prepandial:   less than 140 mg/dL      Peak postprandial:   less than 180 mg/dL (1-2 hours)      Critically ill patients:  140 - 180 mg/dL   Results for Matthew Eaton, Matthew Eaton (MRN [id number]) as of 05/26/2019 11:07  Ref. Range 05/25/2019 07:17 05/25/2019 11:13 05/25/2019 15:41 05/25/2019 19:20 05/25/2019 23:17 05/26/2019 03:16 05/26/2019 07:44  Glucose-Capillary Latest Ref Range: 70 - 99 mg/dL 155 (H) 163 (H) 177 (H) 152 (H) 235 (H) 239 (H) 214 (H)   Review of Glycemic Control  Current orders for Inpatient glycemic control: Lantus 10 units QHS, Novolog 0-15 units Q4H; Vital @ 60 ml/hr  Inpatient Diabetes Program Recommendations:    Insulin-Tube Feeding: Please consider ordering Novolog 3 units Q4H for tube feeding coverage. If tube feeding is stopped or held then Novolog tube feeding coverage should also be stopped or held.   Thanks, Barnie Alderman, RN, MSN, CDE Diabetes Coordinator Inpatient Diabetes Program [phone] (Team Pager from 8am to 5pm)

## 2019-05-26 NOTE — Progress Notes (Signed)
Pharmacy Antibiotic Note Matthew Eaton is a 59 y.o. male admitted on 06/02/2019 with HAP. Pharmacy has been consulted for Zosyn dosing.  Assessment: ARDS due to Covid pneumonia Intubated 4/8, tracheostomy 0000000 Course complicated by MSSA and Haemophilus pneumonia  Updates today: -Febrile and WBC increasing 11>19 on cefazolin D#4 for MSSA PNA -Will broaden abx and collect new cultures  Plan: Zosyn 3.375g IV Q8h Discontinue cefazolin F/u clinical progress, c/s, de-escalation, and LOT  Height: 5\' 10"  (177.8 cm) Weight: 100.3 kg (221 lb 1.9 oz) IBW/kg (Calculated) : 73  Temp (24hrs), Avg:101.1 F (38.4 C), Min:98.5 F (36.9 C), Max:103.9 F (39.9 C)  Recent Labs  Lab 05/22/19 0609 05/23/19 0332 05/24/19 0349 05/25/19 0301 05/26/19 1003  WBC 12.6* 11.1* 12.2* 11.5* 19.2*  CREATININE 1.13 1.15 1.08 1.04 0.90    Estimated Creatinine Clearance: 106.2 mL/min (by C-G formula based on SCr of 0.9 mg/dL).    Allergies  Allergen Reactions  . Gabapentin Other (See Comments)    Severe tingling in the legs and weakness (of the legs)  . Methocarbamol Anaphylaxis, Shortness Of Breath and Other (See Comments)    Respiratory arrest- Turned purple    Antimicrobials this admission: Remdes 3/31>>4/4 Actemra x 1 4/1 Cefepime 4/8 >> 4/10 Amoxicillin 4/10>> 4/12 Augmentin 4/12 >> 4/17 Vanc 4/19>>4/20 Cefazolin 4/20>>4/23 Zosyn 4/23 >>  Dose adjustments this admission: N/A  Microbiology results: 4/8 TA - abundant HFlu b-lactamase neg; few MSSA 4/8 BCx - negF  4/12: MRSA PCR neg 4/18 TA - mod MSSA (pan-sens) 4/23 Bcx: ngtd 4/23 UCx: pending  Thank you for allowing pharmacy to be a part of this patient's care.  Kennon Holter, PharmD PGY1 Ambulatory Care Pharmacy Resident 05/26/2019 2:11 PM

## 2019-05-26 NOTE — Progress Notes (Addendum)
NAME:  Matthew Eaton, MRN:  [id number], DOB:  [birth year], LOS: 72 ADMISSION DATE:  05/04/2019, CONSULTATION DATE:  05/11/2019 REFERRING MD:  [facility], CHIEF COMPLAINT:  hypoxemia   Brief History    59 year old with a 9-day history of slowly worsening Covid pneumonia despite appropriate therapy Transferred to unit, intubated 05/11/2019  History of present illness    Patient is a 59 year old male initially admitted 3/31 to 05/10/2019 for Covid pneumonia.  He signed out AMA the evening of 05/18/2019.  Then returned the evening of 4/5 complaining of worsening shortness of breath.  Presenting saturations at that time were in the 80s and he came up into the mid 90s on 100% oxygen.  We were asked to see early this morning in regards to worsening hypoxemia. The patient was noncompliant with his oxygen.  On high flow nasal cannula at 100% the patient had an oxygen saturation of about 79%. With the addition of 100% facemask the patient had oxygen saturation in the mid 80s.  He was verbal throughout and did not look particularly dyspneic.  Patient was urgently transferred to the ICU and intubated.  At the time of this dictation he is ventilated on 100% with an oxygen saturation between 85 and 95%. He completed his Decadron, remdesivir, and Actemra.  Chest x-ray performed earlier this morning shows worsening infiltrate particularly throughout the right lung.  Past Medical History   Past Medical History:  Diagnosis Date  . Chronic neck pain 05/20/2012  . Family hx of ALS (amyotrophic lateral sclerosis) 05/27/2013  . GERD (gastroesophageal reflux disease)   . Hiatal hernia   . HLD (hyperlipidemia)   . Major depressive disorder, recurrent episode, moderate ([place]) 05/27/2013  . Neck pain    cervical spine with herniation  . Tobacco abuse   . Wrist fracture, left    Significant Hospital Events    Transferred to ICU intubation 05/11/2019 4/11-proning 4/12-tolerating supine  ventilation 4/13-tolerating PEEP wean. 4/18- trach to facilitate vent wean 4/22: ok to come off covid precautions 4/22 Transferred out of 73M  Consults:  [facility], PCCM  Procedures:  Intubation 05/11/2019 Central line placement 05/11/2019 4/18 trach  Significant Diagnostic Tests:  4/6 ctpa: 1. No demonstrable pulmonary embolus. No thoracic aortic aneurysm. There are foci of aortic atherosclerosis.  2. Widespread ground-glass type opacity consistent with extensive multifocal pneumonia, slightly increased in the right middle lobe and stable albeit extensive elsewhere. Overall there is more infiltrate on the left than on the right, stable. Underlying degree of emphysema.  3.  No evident adenopathy.  4.  Stable moderate hiatal hernia.  5.  No evident adenopathy.  4/7 echo: LVEF 123456, grade I diastolic dysfunc 4/7 LE venous duplex: negative bilaterally for DVT  Micro Data:  4/8 Respiratory cultures-abundant MSSA, Haemophilus influenzae Covid PCR positive 3/31 4/18 resp: MSSA  Antimicrobials:  Cefepime 4/7-4/10 Ampicillin 4/10-4/11 Augmentin 4/12-4/16 Remdesivir-completed vanc 4/19->4/20 Cefazolin 4/20->  Interim history/subjective:  4/22: increasing po oxy in attempt to wean fentanyl. Appears comfortable but rass -4 which is not helping vent wean. Ok to come off precautions will attempt to transfer out to allow liberalization of visitation.  4/21:slowly decreasing fentanyl this morning, had increased air hunger without it yesterday. 4/20: pt appears much more uncomfortable and tachypneic today. He is diaphoretic with tmax 101.1. his secretions are worse and he has more rhonchi. Overnight fentanyl appeared to work the best we have been unable to transition off propofol without it.  4/19: no acute events. tachypneic on propofol and  precedex. Will attempt to get off propofol today, resp cx positive for staph and febrile with elevated pct and wbc 4/18:No events,  still heavily sedated for some reason. 4/22 Overnight required change in  sedation due to ventilator dyssynchrony on maximum infusion rates of Precedex and Fentanyl. Precedex was discontinued, Propofol was initiated Temp of 103.9, No WBC this am, No CXR + 6 L   Objective   Blood pressure (!) 114/99, pulse (!) 106, temperature 99.8 F (37.7 C), temperature source Core, resp. rate (!) 34, height 5\' 10"  (1.778 m), weight 100.3 kg, SpO2 99 %.    Vent Mode: PRVC FiO2 (%):  [60 %-70 %] 70 % Set Rate:  [28 bmp] 28 bmp Vt Set:  [550 mL] 550 mL PEEP:  [12 cmH20] 12 cmH20 Plateau Pressure:  [19 cmH20-35 cmH20] 35 cmH20    Intake/Output Summary (Last 24 hours) at 05/26/2019 0900 Last data filed at 05/26/2019 0800 Gross per 24 hour  Intake 5232.41 ml  Output 500 ml  Net 4732.41 ml   Filed Weights   05/24/19 0428 05/25/19 0452 05/26/19 0500  Weight: 92.1 kg 94.2 kg 100.3 kg   Examination:  GEN: Elderly man on mechanical ventilation, sedated HEENT: [place]/AT, No LAD,  eyes anicteric, oral mucosa moist.  Pupils reactive. CorTrack Neck: Trach secure and  in place with dried blood on the bandage.   CV: S1, S2, Regular rate and rhythm, no murmurs, rub or gallop PULM: Bilateral chest excursion, diminished bilaterally per bases, Copious tan secretions from trach,.  Tachypneic, mild vent dyssynchrony.  GI: Soft, nontender, nondistended.BS + EXT: No cyanosis or edema.  Extremities cool. Brisk cap refill NEURO: Eyes open to verbal or tactile stim  Upward deviation noted, Resisting oral care.  Not following commands. Extends to pain PSYCH: RASS 0, CAM+ SKIN: No rashes, no lesions,  or wounds   4/21 CXR -trach in place, improving right sided opacity, persistent to worsening left-sided opacities.  Condom cath Rectal tube PIVs 6-0 shiley cuffed OG  Resolved Hospital Problem list     Assessment & Plan:   Acute hypoxemic respiratory failure requiring mechanical ventilation secondary to Covid  pneumonia.  Superimposed MSSA and haemophilus pneumonia.  MSSA persistent.  -Previously completed 9 days of antibiotics. Restarted on 4/19 for persistent MSSA.  Continue cefazolin for 7 days -Continue chest PT every 4 hours -Continue full vent support, 4-8 cc/kg ideal body weight with goal plateau less than 30 and driving pressure less than 15.  Titrate PEEP and FiO2 per ARDS ladder.  Tolerating vent dyssynchrony. -Continue sedation as required to tolerate mechanical ventilation.  - Continue Propofol  and fentanyl. - increased po oxy and Seroquel dosing  to wean fentanyl / propofol - Off Covid Precautions as of 4/21 -VAP prevention protocol - Will add Lasix 40 mg BID x 2 days  4/23 T Max 103.9 -Previously completed 9 days of antibiotics. Restarted on 4/19 for persistent MSSA.   - Continue cefazolin for 7 days - Will Culture Sputum, Blood and Urine 4/23 - CXR now 4/23 - Trend WBC and fever curve - Re-culture as is clinically indicated  Acute encephalopathy, multifactorial metabolic/hypoxic resp failure:  -challenging to control with desats and tachypnea when fentanyl or propofol weaned.  -limited on seroquel by qt prolongation, but dose increased 4/23 - Slow progress - will increase po oxy at this time 4/23.  Marland Kitchen COVID-19 pneumonia -Previously completed remdesivir and dexamethasone -off precautions as of 4/21  Hypernatremia:  -improved 144 on 4/23 -cont free water  flushes to 250 cc every 4 hours -Continue to monitor, BMET daily   Prediabetes, A1c 6.4 -Continue Accu-Cheks every 4 hours with sliding scale insulin as needed -cont Lantus 10 units daily -Goal BG 140-180 while admitted to the ICU  Macrocytic anemia, normal B12 - folate -Transfuse for hemoglobin less than 7, no current indication. -Continue to monitor  Prolonged QTc at admission -EKG 4/21 with qtc in 470's -maintain Seroquel with close QTc monitoring  to see if fentanyl and Propofol can be weaned  Muscular  deconditioning- PT/OT consults, pt remains too somnolent at this time but as improves, either chair position in bed or move to chair  Best practice:  Diet: Tube feeds Pain/Anxiety/Delirium protocol (if indicated): wean Fluid status goals: even to negative as renal function allows VAP protocol (if indicated): In place DVT prophylaxis: Lovenox Central lines: none GU: condom catheter. GI prophylaxis: Protonix Glucose control: SSI Mobility: Bedrest Code Status: Full code Family Communication: Called home and cell  number  to update wife, no answer. Left a HIPPA compliant message asking her to call the unit for an update. Disposition: ICU now in 2 M  As outside 21 day mark to allow  for family to visit.   PCCM APP time : 30 minutes  Magdalen Spatz, MSN, AGACNP-BC [place] Pager # [phone] After 4 pm please call [phone] 05/26/2019 9:01 AM

## 2019-05-26 NOTE — Progress Notes (Signed)
Assisted tele visit to patient with wife.  Matthew Eaton, Philis Nettle, RN

## 2019-05-27 ENCOUNTER — Inpatient Hospital Stay (HOSPITAL_COMMUNITY): Payer: PPO

## 2019-05-27 DIAGNOSIS — J189 Pneumonia, unspecified organism: Secondary | ICD-10-CM

## 2019-05-27 DIAGNOSIS — G9340 Encephalopathy, unspecified: Secondary | ICD-10-CM

## 2019-05-27 DIAGNOSIS — J80 Acute respiratory distress syndrome: Secondary | ICD-10-CM

## 2019-05-27 DIAGNOSIS — Y95 Nosocomial condition: Secondary | ICD-10-CM

## 2019-05-27 DIAGNOSIS — U071 COVID-19: Secondary | ICD-10-CM

## 2019-05-27 LAB — CBC
HCT: 32.3 % — ABNORMAL LOW (ref 39.0–52.0)
Hemoglobin: 9.4 g/dL — ABNORMAL LOW (ref 13.0–17.0)
MCH: 30.1 pg (ref 26.0–34.0)
MCHC: 29.1 g/dL — ABNORMAL LOW (ref 30.0–36.0)
MCV: 103.5 fL — ABNORMAL HIGH (ref 80.0–100.0)
Platelets: 367 10*3/uL (ref 150–400)
RBC: 3.12 MIL/uL — ABNORMAL LOW (ref 4.22–5.81)
RDW: 16.3 % — ABNORMAL HIGH (ref 11.5–15.5)
WBC: 16.7 10*3/uL — ABNORMAL HIGH (ref 4.0–10.5)
nRBC: 0.2 % (ref 0.0–0.2)

## 2019-05-27 LAB — BASIC METABOLIC PANEL
Anion gap: 10 (ref 5–15)
BUN: 71 mg/dL — ABNORMAL HIGH (ref 6–20)
CO2: 26 mmol/L (ref 22–32)
Calcium: 8.8 mg/dL — ABNORMAL LOW (ref 8.9–10.3)
Chloride: 105 mmol/L (ref 98–111)
Creatinine, Ser: 1.12 mg/dL (ref 0.61–1.24)
GFR calc Af Amer: >60 mL/min (ref >60)
GFR calc non Af Amer: >60 mL/min (ref >60)
Glucose, Bld: 244 mg/dL — ABNORMAL HIGH (ref 70–99)
Potassium: 4.6 mmol/L (ref 3.5–5.1)
Sodium: 141 mmol/L (ref 135–145)

## 2019-05-27 LAB — GLUCOSE, CAPILLARY
Glucose-Capillary: 214 mg/dL — ABNORMAL HIGH (ref 70–99)
Glucose-Capillary: 185 mg/dL — ABNORMAL HIGH (ref 70–99)
Glucose-Capillary: 222 mg/dL — ABNORMAL HIGH (ref 70–99)
Glucose-Capillary: 167 mg/dL — ABNORMAL HIGH (ref 70–99)
Glucose-Capillary: 149 mg/dL — ABNORMAL HIGH (ref 70–99)
Glucose-Capillary: 189 mg/dL — ABNORMAL HIGH (ref 70–99)

## 2019-05-27 LAB — URINE CULTURE
Culture: NO GROWTH
Special Requests: NORMAL

## 2019-05-27 LAB — MAGNESIUM: Magnesium: 2.8 mg/dL — ABNORMAL HIGH (ref 1.7–2.4)

## 2019-05-27 MED ORDER — INSULIN GLARGINE 100 UNIT/ML ~~LOC~~ SOLN
20.0000 [IU] | Freq: Every day | SUBCUTANEOUS | Status: DC
  Administered 2019-05-27 – 2019-06-05 (×10): 20 [IU] via SUBCUTANEOUS
  Filled 2019-05-27 (×11): qty 0.2

## 2019-05-27 MED ORDER — INSULIN ASPART 100 UNIT/ML ~~LOC~~ SOLN
3.0000 [IU] | SUBCUTANEOUS | Status: DC
  Administered 2019-05-27 – 2019-06-06 (×54): 3 [IU] via SUBCUTANEOUS

## 2019-05-27 MED ORDER — FUROSEMIDE 10 MG/ML IJ SOLN
80.0000 mg | Freq: Two times a day (BID) | INTRAMUSCULAR | Status: AC
  Administered 2019-05-27 (×2): 80 mg via INTRAVENOUS
  Filled 2019-05-27 (×2): qty 8

## 2019-05-27 NOTE — Progress Notes (Signed)
[place] Progress Note Patient Name: Matthew Eaton DOB: [birth year] MRN: [id number]   Date of Service  05/27/2019  HPI/Events of Note  Pt with acute urinary retention  eICU Interventions  Foley catheter ordered        Frederik Pear 05/27/2019, 12:42 AM

## 2019-05-27 NOTE — Progress Notes (Signed)
Assisted family with video/camera time via elink 

## 2019-05-27 NOTE — Progress Notes (Signed)
NAME:  Matthew Eaton, MRN:  [id number], DOB:  1960/09/02, LOS: 35 ADMISSION DATE:  05/23/2019, CONSULTATION DATE:  05/11/2019 REFERRING MD:  [facility], CHIEF COMPLAINT:  hypoxemia   Brief History    59 year old with a 9-day history of slowly worsening Covid pneumonia despite appropriate therapy Transferred to unit, intubated 05/11/2019  History of present illness    Patient is a 59 year old male initially admitted 3/31 to 05/19/2019 for Covid pneumonia.  He signed out AMA the evening of 05/25/2019.  Then returned the evening of 4/5 complaining of worsening shortness of breath.  Presenting saturations at that time were in the 80s and he came up into the mid 90s on 100% oxygen.  We were asked to see early this morning in regards to worsening hypoxemia. The patient was noncompliant with his oxygen.  On high flow nasal cannula at 100% the patient had an oxygen saturation of about 79%. With the addition of 100% facemask the patient had oxygen saturation in the mid 80s.  He was verbal throughout and did not look particularly dyspneic.  Patient was urgently transferred to the ICU and intubated.  At the time of this dictation he is ventilated on 100% with an oxygen saturation between 85 and 95%. He completed his Decadron, remdesivir, and Actemra.  Chest x-ray performed earlier this morning shows worsening infiltrate particularly throughout the right lung.  Past Medical History   Past Medical History:  Diagnosis Date  . Chronic neck pain 05/20/2012  . Family hx of ALS (amyotrophic lateral sclerosis) 05/27/2013  . GERD (gastroesophageal reflux disease)   . Hiatal hernia   . HLD (hyperlipidemia)   . Major depressive disorder, recurrent episode, moderate ([place]) 05/27/2013  . Neck pain    cervical spine with herniation  . Tobacco abuse   . Wrist fracture, left    Significant Hospital Events    Transferred to ICU intubation 05/11/2019 4/11-proning 4/12-tolerating supine  ventilation 4/13-tolerating PEEP wean. 4/18- trach to facilitate vent wean 4/22: ok to come off covid precautions 4/22 Transferred out of 25M  Consults:  [facility], PCCM  Procedures:  Intubation 05/11/2019 Central line placement 05/11/2019 4/18 trach  Significant Diagnostic Tests:  4/6 ctpa: 1. No demonstrable pulmonary embolus. No thoracic aortic aneurysm. There are foci of aortic atherosclerosis.  2. Widespread ground-glass type opacity consistent with extensive multifocal pneumonia, slightly increased in the right middle lobe and stable albeit extensive elsewhere. Overall there is more infiltrate on the left than on the right, stable. Underlying degree of emphysema.  3.  No evident adenopathy.  4.  Stable moderate hiatal hernia.  5.  No evident adenopathy.  4/7 echo: LVEF 123456, grade I diastolic dysfunc 4/7 LE venous duplex: negative bilaterally for DVT  Micro Data:  4/8 Respiratory cultures-abundant MSSA, Haemophilus influenzae Covid PCR positive 3/31 4/18 resp: MSSA  Antimicrobials:  Cefepime 4/7-4/10 Ampicillin 4/10-4/11 Augmentin 4/12-4/16 Remdesivir-completed vanc 4/19->4/20 Cefazolin 4/20 >> Current Zosyn 4/23 >> Current  Interim history/subjective:  4/22: increasing po oxy in attempt to wean fentanyl. Appears comfortable but rass -4 which is not helping vent wean. Ok to come off precautions will attempt to transfer out to allow liberalization of visitation.  4/21:slowly decreasing fentanyl this morning, had increased air hunger without it yesterday. 4/20: pt appears much more uncomfortable and tachypneic today. He is diaphoretic with tmax 101.1. his secretions are worse and he has more rhonchi. Overnight fentanyl appeared to work the best we have been unable to transition off propofol without it.  4/19: no  acute events. tachypneic on propofol and precedex. Will attempt to get off propofol today, resp cx positive for staph and febrile with elevated  pct and wbc 4/18:No events, still heavily sedated for some reason. 4/22 Overnight required change in  sedation due to ventilator dyssynchrony on maximum infusion rates of Precedex and Fentanyl. Precedex was discontinued, Propofol was initiated Temp of 103.9, No WBC this am, No CXR + 6 L  Objective   Blood pressure 110/60, pulse (!) 109, temperature 98.8 F (37.1 C), resp. rate (!) 25, height 5\' 10"  (1.778 m), weight 100.1 kg, SpO2 99 %.    Vent Mode: PRVC FiO2 (%):  [60 %-70 %] 60 % Set Rate:  [24 bmp-28 bmp] 24 bmp Vt Set:  [550 mL] 550 mL PEEP:  [12 cmH20] 12 cmH20 Plateau Pressure:  [24 cmH20-35 cmH20] 24 cmH20    Intake/Output Summary (Last 24 hours) at 05/27/2019 0648 Last data filed at 05/27/2019 0600 Gross per 24 hour  Intake 3966.53 ml  Output 1575 ml  Net 2391.53 ml   Filed Weights   05/25/19 0452 05/26/19 0500 05/27/19 0500  Weight: 94.2 kg 100.3 kg 100.1 kg   Examination:  GEN: Elderly man on mechanical ventilation, sedated HEENT: [place]/AT, No LAD,  eyes anicteric, oral mucosa moist.  Pupils reactive. CorTrack Neck: Trach secure and  in place with dried blood on the bandage.   CV: S1, S2, Regular rate and rhythm, no murmurs, rub or gallop PULM: Bilateral chest excursion, diminished bilaterally per bases, Copious tan secretions from trach,.  Tachypneic, mild vent dyssynchrony.  GI: Soft, nontender, nondistended.BS + EXT: No cyanosis or edema.  Extremities cool. Brisk cap refill NEURO: Eyes open to verbal or tactile stim  Upward deviation noted, Resisting oral care.  Not following commands. Extends to pain PSYCH: RASS 0, CAM+ SKIN: No rashes, no lesions,  or wounds  4/24 CXR -trach in place, improving right sided opacity, persistent to worsening left-sided opacities.  Resolved Hospital Problem list     Assessment & Plan:   Acute hypoxemic respiratory failure requiring mechanical ventilation secondary to Covid pneumonia.  Superimposed MSSA and haemophilus pneumonia.   MSSA persistent. - Previously completed 9 days of antibiotics. Restarted on 4/19 for persistent MSSA.  Continue cefazolin for 7 days (5/7) - Continue chest PT every 4 hours - Continue full vent support, 4-8 cc/kg ideal body weight with goal plateau less than 30 and driving pressure less than 15.  Titrate PEEP and FiO2 per ARDS ladder.  Tolerating vent dyssynchrony. - Continue oxycodone 10 mg every 6 hours, Clonazepam 1 mg BID, and Seroquel 100 mg BID. Try to wean Propofol  and fentanyl. - Off Covid Precautions as of 4/21 - VAP prevention protocol - Minimal UOP with 40mg  Furosemide every 12 hours. Increase to 80 mg BID   Acute encephalopathy, multifactorial metabolic/hypoxic resp failure:  - challenging to control with desats and tachypnea when fentanyl or propofol weaned.  - Continue oxycodone 10 mg every 6 hours, Clonazepam 1 mg BID, and Seroquel 100 mg BID . COVID-19 pneumonia - Previously completed remdesivir and dexamethasone - Off precautions as of 4/21  Hypernatremia:  - Cont free water flushes to 250 cc every 4 hours - Continue to monitor, BMET daily  Prediabetes, A1c 6.4 -Continue Accu-Cheks every 4 hours with sliding scale insulin as needed - Increase Lantus 20 units daily -Goal BG 140-180 while admitted to the ICU  Macrocytic anemia, normal B12 - folate -Transfuse for hemoglobin less than 7, no current indication. -Continue  to monitor  Prolonged QTc at admission - EKG 4/21 with qtc in 470's - Maintain Seroquel with close QTc monitoring  to see if fentanyl and Propofol can be weaned  Muscular deconditioning- PT/OT consults, pt remains too somnolent at this time but as improves, either chair position in bed or move to chair  Best practice:  Diet: Tube feeds Pain/Anxiety/Delirium protocol (if indicated): wean Fluid status goals: even to negative as renal function allows VAP protocol (if indicated): In place DVT prophylaxis: Lovenox Central lines: none GU: condom  catheter. GI prophylaxis: Protonix Glucose control: SSI Mobility: Bedrest Code Status: Full code Family Communication: Will update family Disposition: ICU   Please see attending attestation for final recommendations.   Ina Homes, MD IMTS PGY3  Pager: [phone] 05/27/2019 6:48 AM

## 2019-05-28 LAB — BASIC METABOLIC PANEL
Anion gap: 11 (ref 5–15)
BUN: 72 mg/dL — ABNORMAL HIGH (ref 6–20)
CO2: 28 mmol/L (ref 22–32)
Calcium: 8.6 mg/dL — ABNORMAL LOW (ref 8.9–10.3)
Chloride: 102 mmol/L (ref 98–111)
Creatinine, Ser: 1.13 mg/dL (ref 0.61–1.24)
GFR calc Af Amer: >60 mL/min (ref >60)
GFR calc non Af Amer: >60 mL/min (ref >60)
Glucose, Bld: 185 mg/dL — ABNORMAL HIGH (ref 70–99)
Potassium: 4.2 mmol/L (ref 3.5–5.1)
Sodium: 141 mmol/L (ref 135–145)

## 2019-05-28 LAB — TRIGLYCERIDES: Triglycerides: 872 mg/dL — ABNORMAL HIGH (ref <150)

## 2019-05-28 LAB — GLUCOSE, CAPILLARY
Glucose-Capillary: 149 mg/dL — ABNORMAL HIGH (ref 70–99)
Glucose-Capillary: 156 mg/dL — ABNORMAL HIGH (ref 70–99)
Glucose-Capillary: 171 mg/dL — ABNORMAL HIGH (ref 70–99)
Glucose-Capillary: 200 mg/dL — ABNORMAL HIGH (ref 70–99)
Glucose-Capillary: 205 mg/dL — ABNORMAL HIGH (ref 70–99)
Glucose-Capillary: 232 mg/dL — ABNORMAL HIGH (ref 70–99)

## 2019-05-28 LAB — CBC
HCT: 28.9 % — ABNORMAL LOW (ref 39.0–52.0)
Hemoglobin: 8.6 g/dL — ABNORMAL LOW (ref 13.0–17.0)
MCH: 30.4 pg (ref 26.0–34.0)
MCHC: 29.8 g/dL — ABNORMAL LOW (ref 30.0–36.0)
MCV: 102.1 fL — ABNORMAL HIGH (ref 80.0–100.0)
Platelets: 356 10*3/uL (ref 150–400)
RBC: 2.83 MIL/uL — ABNORMAL LOW (ref 4.22–5.81)
RDW: 16 % — ABNORMAL HIGH (ref 11.5–15.5)
WBC: 13.6 10*3/uL — ABNORMAL HIGH (ref 4.0–10.5)
nRBC: 0.6 % — ABNORMAL HIGH (ref 0.0–0.2)

## 2019-05-28 MED ORDER — FENTANYL CITRATE (PF) 100 MCG/2ML IJ SOLN
25.0000 ug | INTRAMUSCULAR | Status: DC | PRN
  Administered 2019-05-29 (×2): 100 ug via INTRAVENOUS
  Administered 2019-06-02: 50 ug via INTRAVENOUS
  Administered 2019-06-04 – 2019-06-05 (×10): 100 ug via INTRAVENOUS
  Filled 2019-05-28 (×11): qty 2

## 2019-05-28 MED ORDER — FUROSEMIDE 10 MG/ML IJ SOLN
80.0000 mg | Freq: Two times a day (BID) | INTRAMUSCULAR | Status: DC
  Administered 2019-05-28: 80 mg via INTRAVENOUS
  Filled 2019-05-28: qty 8

## 2019-05-28 NOTE — Progress Notes (Signed)
NAME:  Matthew Eaton, MRN:  [id number], DOB:  [birth year], LOS: 69 ADMISSION DATE:  05/21/2019, CONSULTATION DATE:  05/11/2019 REFERRING MD:  [facility], CHIEF COMPLAINT:  hypoxemia   Brief History    59 year old with a 9-day history of slowly worsening Covid pneumonia despite appropriate therapy Transferred to unit, intubated 05/11/2019  History of present illness    Patient is a 59 year old male initially admitted 3/31 to 05/12/2019 for Covid pneumonia.  He signed out AMA the evening of 05/09/2019.  Then returned the evening of 4/5 complaining of worsening shortness of breath.  Presenting saturations at that time were in the 80s and he came up into the mid 90s on 100% oxygen.  We were asked to see early this morning in regards to worsening hypoxemia. The patient was noncompliant with his oxygen.  On high flow nasal cannula at 100% the patient had an oxygen saturation of about 79%. With the addition of 100% facemask the patient had oxygen saturation in the mid 80s.  He was verbal throughout and did not look particularly dyspneic.  Patient was urgently transferred to the ICU and intubated.  At the time of this dictation he is ventilated on 100% with an oxygen saturation between 85 and 95%. He completed his Decadron, remdesivir, and Actemra.  Chest x-ray performed earlier this morning shows worsening infiltrate particularly throughout the right lung.  Past Medical History   Past Medical History:  Diagnosis Date  . Chronic neck pain 05/20/2012  . Family hx of ALS (amyotrophic lateral sclerosis) 05/27/2013  . GERD (gastroesophageal reflux disease)   . Hiatal hernia   . HLD (hyperlipidemia)   . Major depressive disorder, recurrent episode, moderate ([place]) 05/27/2013  . Neck pain    cervical spine with herniation  . Tobacco abuse   . Wrist fracture, left    Significant Hospital Events    Transferred to ICU intubation 05/11/2019 4/11-proning 4/12-tolerating supine  ventilation 4/13-tolerating PEEP wean. 4/18- trach to facilitate vent wean 4/22: ok to come off covid precautions 4/22 Transferred out of 59M  Consults:  [facility], PCCM  Procedures:  Intubation 05/11/2019 Central line placement 05/11/2019 4/18 trach  Significant Diagnostic Tests:  4/6 ctpa: 1. No demonstrable pulmonary embolus. No thoracic aortic aneurysm. There are foci of aortic atherosclerosis.  2. Widespread ground-glass type opacity consistent with extensive multifocal pneumonia, slightly increased in the right middle lobe and stable albeit extensive elsewhere. Overall there is more infiltrate on the left than on the right, stable. Underlying degree of emphysema.  3.  No evident adenopathy.  4.  Stable moderate hiatal hernia.  5.  No evident adenopathy.  4/7 echo: LVEF 123456, grade I diastolic dysfunc 4/7 LE venous duplex: negative bilaterally for DVT  Micro Data:  4/8 Respiratory cultures-abundant MSSA, Haemophilus influenzae Covid PCR positive 3/31 4/18 resp: MSSA 4/23: Blood culutres >> NGTD 4/23: Respiratory cultures >> Klebsiella  4/23: Urine cultures >> NGTD  Antimicrobials:  Cefepime 4/7-4/10 Ampicillin 4/10-4/11 Augmentin 4/12-4/16 Remdesivir-completed vanc 4/19->4/20 Cefazolin 4/20 >> 4/23 Zosyn 4/23 >> Current  Interim history/subjective:  Sedated. Dyssynchrony this AM on the vent requiring increased sedation. FiO2 and PEEP stable.   Objective   Blood pressure 124/65, pulse 94, temperature 99.8 F (37.7 C), temperature source Rectal, resp. rate (!) 31, height 5\' 10"  (1.778 m), weight 101.5 kg, SpO2 95 %.    Vent Mode: PRVC FiO2 (%):  [50 %-60 %] 60 % Set Rate:  [24 bmp] 24 bmp Vt Set:  [550 mL] 550 mL PEEP:  [  [place] Pressure:  [25 cmH20-27 cmH20] 25 cmH20    Intake/Output Summary (Last 24 hours) at 05/28/2019 1028 Last data filed at 05/28/2019 0800 Gross per 24 hour  Intake 3095.3 ml  Output 2355 ml  Net  740.3 ml   Filed Weights   05/26/19 0500 05/27/19 0500 05/28/19 0500  Weight: 100.3 kg 100.1 kg 101.5 kg   Examination:  GEN: Elderly man on mechanical ventilation, sedated HEENT: [place]/AT, No LAD,  eyes anicteric, oral mucosa moist.  Pupils reactive. CorTrack Neck: Trach secure and  in place with dried blood on the bandage.   CV: S1, S2, Regular rate and rhythm, no murmurs, rub or gallop PULM: Bilateral chest excursion, diminished bilaterally per bases, Copious tan secretions from trach,.  Tachypneic, mild vent dyssynchrony.  GI: Soft, nontender, nondistended.BS + EXT: No cyanosis or edema.  Extremities cool. Brisk cap refill NEURO: Eyes open to verbal or tactile stim  Upward deviation noted, Resisting oral care.  Not following commands. Extends to pain PSYCH: RASS 0, CAM+ SKIN: No rashes, no lesions,  or wounds  Resolved Hospital Problem list     Assessment & Plan:   Acute hypoxemic respiratory failure requiring mechanical ventilation secondary to Covid pneumonia.   Superimposed MSSA, Klebsiella, and haemophilus pneumonia.   - Continue Zosyn with Klebsiella on respiratory cultures from 4/23  - Continue chest PT every 4 hours - Continue full vent support, 4-8 cc/kg ideal body weight with goal plateau less than 30 and driving pressure less than 15.  Titrate PEEP and FiO2 per ARDS ladder.  Tolerating vent dyssynchrony. - Continue oxycodone 10 mg every 6 hours, Clonazepam 1 mg BID, and Seroquel 100 mg BID. Try to wean Propofol  and fentanyl. - Off Covid Precautions as of 4/21 - VAP prevention protocol - Continue to diurese with IV furosemide 80 mg BID  Acute encephalopathy, multifactorial metabolic/hypoxic resp failure:  - challenging to control with desats and tachypnea when fentanyl or propofol weaned.  - Continue oxycodone 10 mg every 6 hours, Clonazepam 1 mg BID, and Seroquel 100 mg BID - Check QTc . COVID-19 pneumonia - Previously completed remdesivir and dexamethasone - Off  precautions as of 4/21  Hypernatremia:  - Cont free water flushes to 250 cc every 4 hours - Continue to monitor, BMET daily  Prediabetes, A1c 6.4 -Continue Accu-Cheks every 4 hours with sliding scale insulin as needed - Increase Lantus 20 units daily -Goal BG 140-180 while admitted to the ICU  Macrocytic anemia, normal B12 - Folate -Transfuse for hemoglobin less than 7, no current indication. -Continue to monitor  Prolonged QTc at admission - EKG 4/21 with qtc in 470's - Maintain Seroquel with close QTc monitoring  to see if fentanyl and Propofol can be weaned  Muscular deconditioning- PT/OT consults, pt remains too somnolent at this time but as improves, either chair position in bed or move to chair  Best practice:  Diet: Tube feeds Pain/Anxiety/Delirium protocol (if indicated): wean Fluid status goals: even to negative as renal function allows VAP protocol (if indicated): In place DVT prophylaxis: Lovenox Central lines: none GU: condom catheter. GI prophylaxis: Protonix Glucose control: SSI Mobility: Bedrest Code Status: Full code Family Communication: Wife updated at bedside Disposition: ICU   Please see attending attestation for final recommendations.   Ina Homes, MD IMTS PGY3  Pager: [phone] 05/28/2019 10:28 AM

## 2019-05-28 NOTE — Progress Notes (Signed)
Assisted wife with camera/video time via elink

## 2019-05-29 ENCOUNTER — Inpatient Hospital Stay: Payer: Self-pay

## 2019-05-29 LAB — GLUCOSE, CAPILLARY
Glucose-Capillary: 164 mg/dL — ABNORMAL HIGH (ref 70–99)
Glucose-Capillary: 156 mg/dL — ABNORMAL HIGH (ref 70–99)
Glucose-Capillary: 197 mg/dL — ABNORMAL HIGH (ref 70–99)
Glucose-Capillary: 238 mg/dL — ABNORMAL HIGH (ref 70–99)
Glucose-Capillary: 204 mg/dL — ABNORMAL HIGH (ref 70–99)
Glucose-Capillary: 208 mg/dL — ABNORMAL HIGH (ref 70–99)

## 2019-05-29 LAB — BASIC METABOLIC PANEL
Anion gap: 11 (ref 5–15)
BUN: 55 mg/dL — ABNORMAL HIGH (ref 6–20)
CO2: 29 mmol/L (ref 22–32)
Calcium: 8.8 mg/dL — ABNORMAL LOW (ref 8.9–10.3)
Chloride: 99 mmol/L (ref 98–111)
Creatinine, Ser: 0.75 mg/dL (ref 0.61–1.24)
GFR calc Af Amer: >60 mL/min (ref >60)
GFR calc non Af Amer: >60 mL/min (ref >60)
Glucose, Bld: 170 mg/dL — ABNORMAL HIGH (ref 70–99)
Potassium: 3.7 mmol/L (ref 3.5–5.1)
Sodium: 139 mmol/L (ref 135–145)

## 2019-05-29 LAB — CBC
HCT: 31.5 % — ABNORMAL LOW (ref 39.0–52.0)
Hemoglobin: 9.5 g/dL — ABNORMAL LOW (ref 13.0–17.0)
MCH: 30.1 pg (ref 26.0–34.0)
MCHC: 30.2 g/dL (ref 30.0–36.0)
MCV: 99.7 fL (ref 80.0–100.0)
Platelets: 400 10*3/uL (ref 150–400)
RBC: 3.16 MIL/uL — ABNORMAL LOW (ref 4.22–5.81)
RDW: 16.2 % — ABNORMAL HIGH (ref 11.5–15.5)
WBC: 17.9 10*3/uL — ABNORMAL HIGH (ref 4.0–10.5)
nRBC: 0.5 % — ABNORMAL HIGH (ref 0.0–0.2)

## 2019-05-29 LAB — CULTURE, RESPIRATORY W GRAM STAIN: Special Requests: NORMAL

## 2019-05-29 MED ORDER — SODIUM CHLORIDE 0.9% FLUSH
10.0000 mL | Freq: Two times a day (BID) | INTRAVENOUS | Status: DC
  Administered 2019-05-29 – 2019-06-06 (×15): 10 mL

## 2019-05-29 MED ORDER — CEFAZOLIN SODIUM-DEXTROSE 2-4 GM/100ML-% IV SOLN
2.0000 g | Freq: Three times a day (TID) | INTRAVENOUS | Status: AC
  Administered 2019-05-29 – 2019-06-01 (×12): 2 g via INTRAVENOUS
  Filled 2019-05-29 (×15): qty 100

## 2019-05-29 MED ORDER — CLONAZEPAM 1 MG PO TABS
1.0000 mg | ORAL_TABLET | Freq: Three times a day (TID) | ORAL | Status: DC
  Administered 2019-05-29 – 2019-06-01 (×9): 1 mg
  Filled 2019-05-29 (×9): qty 1

## 2019-05-29 MED ORDER — MIDAZOLAM HCL 2 MG/2ML IJ SOLN: 2.0000 mg | Freq: Once | INTRAMUSCULAR | Status: AC

## 2019-05-29 MED ORDER — MIDAZOLAM HCL 2 MG/2ML IJ SOLN
INTRAMUSCULAR | Status: AC
  Administered 2019-05-29: 2 mg via INTRAVENOUS
  Filled 2019-05-29: qty 2

## 2019-05-29 MED ORDER — MIDAZOLAM HCL 2 MG/2ML IJ SOLN
2.0000 mg | INTRAMUSCULAR | Status: DC | PRN
  Administered 2019-05-29 – 2019-06-05 (×15): 2 mg via INTRAVENOUS
  Filled 2019-05-29 (×16): qty 2

## 2019-05-29 MED ORDER — SODIUM CHLORIDE 0.9% FLUSH: 10.0000 mL | INTRAVENOUS | Status: DC | PRN

## 2019-05-29 MED ORDER — FENTANYL 2500MCG IN NS 250ML (10MCG/ML) PREMIX INFUSION
0.0000 ug/h | INTRAVENOUS | Status: DC
  Administered 2019-05-29: 100 ug/h via INTRAVENOUS
  Administered 2019-05-30 – 2019-06-01 (×4): 200 ug/h via INTRAVENOUS
  Administered 2019-06-05: 150 ug/h via INTRAVENOUS
  Administered 2019-06-06: 200 ug/h via INTRAVENOUS
  Filled 2019-05-29 (×8): qty 250

## 2019-05-29 MED ORDER — FUROSEMIDE 10 MG/ML IJ SOLN
80.0000 mg | Freq: Two times a day (BID) | INTRAMUSCULAR | Status: DC
  Administered 2019-05-29: 80 mg via INTRAVENOUS
  Administered 2019-05-30: 40 mg via INTRAVENOUS
  Administered 2019-05-30: 80 mg via INTRAVENOUS
  Administered 2019-05-30: 40 mg via INTRAVENOUS
  Administered 2019-05-31 – 2019-06-03 (×7): 80 mg via INTRAVENOUS
  Filled 2019-05-29 (×12): qty 8

## 2019-05-29 NOTE — Progress Notes (Signed)
Peripherally Inserted Central Catheter Placement  The IV Nurse has discussed with the patient and/or persons authorized to consent for the patient, the purpose of this procedure and the potential benefits and risks involved with this procedure.  The benefits include less needle sticks, lab draws from the catheter, and the patient may be discharged home with the catheter. Risks include, but not limited to, infection, bleeding, blood clot (thrombus formation), and puncture of an artery; nerve damage and irregular heartbeat and possibility to perform a PICC exchange if needed/ordered by physician.  Alternatives to this procedure were also discussed.  Bard Power PICC patient education guide, fact sheet on infection prevention and patient information card has been provided to patient /or left at bedside.    PICC Placement Documentation  PICC Double Lumen [id number] PICC Right Basilic 39 cm 0 cm (Active)  Indication for Insertion or Continuance of Line Limited venous access - need for IV therapy >5 days (PICC only) 05/29/19 1105  Exposed Catheter (cm) 0 cm 05/29/19 1105  Site Assessment Clean;Dry;Intact;Other (Comment) 05/29/19 1105  Lumen #1 Status Flushed;Blood return noted 05/29/19 1105  Lumen #2 Status Flushed;Blood return noted 05/29/19 1105  Dressing Type Transparent 05/29/19 1105  Dressing Status Clean;Dry;Intact;Antimicrobial disc in place;Other (Comment) 05/29/19 1105  Line Adjustment (NICU/IV Team Only) Yes 05/29/19 1105  Dressing Intervention New dressing 05/29/19 1105  Dressing Change Due 06/05/19 05/29/19 1105    Telephone consent signed by wife   Christella Noa Albarece 05/29/2019, 11:07 AM

## 2019-05-29 NOTE — Progress Notes (Signed)
Assisted family with camera/video time via elink 

## 2019-05-29 NOTE — Progress Notes (Signed)
NAME:  Matthew Eaton, MRN:  [id number], DOB:  1961/01/22, LOS: 21 ADMISSION DATE:  06/02/2019, CONSULTATION DATE:  05/11/2019 REFERRING MD:  [facility], CHIEF COMPLAINT:  hypoxemia   Brief History    59 year old with a 9-day history of slowly worsening Covid pneumonia despite appropriate therapy Transferred to unit, intubated 05/11/2019  History of present illness    Patient is a 59 year old male initially admitted 3/31 to 05/06/2019 for Covid pneumonia.  He signed out AMA the evening of 05/27/2019.  Then returned the evening of 4/5 complaining of worsening shortness of breath.  Presenting saturations at that time were in the 80s and he came up into the mid 90s on 100% oxygen.  We were asked to see early this morning in regards to worsening hypoxemia. The patient was noncompliant with his oxygen.  On high flow nasal cannula at 100% the patient had an oxygen saturation of about 79%. With the addition of 100% facemask the patient had oxygen saturation in the mid 80s.  He was verbal throughout and did not look particularly dyspneic.  Patient was urgently transferred to the ICU and intubated.  At the time of this dictation he is ventilated on 100% with an oxygen saturation between 85 and 95%. He completed his Decadron, remdesivir, and Actemra.  Chest x-ray performed earlier this morning shows worsening infiltrate particularly throughout the right lung.  Past Medical History   Past Medical History:  Diagnosis Date  . Chronic neck pain 05/20/2012  . Family hx of ALS (amyotrophic lateral sclerosis) 05/27/2013  . GERD (gastroesophageal reflux disease)   . Hiatal hernia   . HLD (hyperlipidemia)   . Major depressive disorder, recurrent episode, moderate ([place]) 05/27/2013  . Neck pain    cervical spine with herniation  . Tobacco abuse   . Wrist fracture, left    Significant Hospital Events    Transferred to ICU intubation 05/11/2019 4/11-proning 4/12-tolerating supine  ventilation 4/13-tolerating PEEP wean. 4/18- trach to facilitate vent wean 4/22: ok to come off covid precautions 4/22: Transferred out of 77M  Consults:  [facility], PCCM  Procedures:  Intubation 05/11/2019 Central line placement 05/11/2019 4/18 trach  Significant Diagnostic Tests:  4/6 ctpa: 1. No demonstrable pulmonary embolus. No thoracic aortic aneurysm. There are foci of aortic atherosclerosis.  2. Widespread ground-glass type opacity consistent with extensive multifocal pneumonia, slightly increased in the right middle lobe and stable albeit extensive elsewhere. Overall there is more infiltrate on the left than on the right, stable. Underlying degree of emphysema.  3.  No evident adenopathy.  4.  Stable moderate hiatal hernia.  5.  No evident adenopathy.  4/7 echo: LVEF 123456, grade I diastolic dysfunc 4/7 LE venous duplex: negative bilaterally for DVT  Micro Data:  4/8 Respiratory cultures-abundant MSSA, Haemophilus influenzae Covid PCR positive 3/31 4/18 resp: MSSA 4/23: Blood culutres >> NGTD 4/23: Respiratory cultures >> Klebsiella and Staph  4/23: Urine cultures >> NGTD  Antimicrobials:  Cefepime 4/7-4/10 Ampicillin 4/10-4/11 Augmentin 4/12-4/16 Remdesivir-completed vanc 4/19->4/20 Cefazolin 4/20 >> 4/23 Zosyn 4/23 >> Current  Interim history/subjective:  Per nursing: Secretions have improved but did have 2 mucus plugs with desaturation overnight. Off sedation briefly but became dyssynchronous with the vent. Good UOP. IV access an issue.  Objective   Blood pressure 117/69, pulse (!) 103, temperature 100 F (37.8 C), temperature source Rectal, resp. rate (!) 34, height 5\' 10"  (1.778 m), weight 102 kg, SpO2 99 %.    Vent Mode: CPAP FiO2 (%):  [50 %-60 %]  50 % Set Rate:  [24 bmp] 24 bmp Vt Set:  [550 mL] 550 mL PEEP:  [12 cmH20-14 cmH20] 14 cmH20 Plateau Pressure:  [22 cmH20-25 cmH20] 22 cmH20    Intake/Output Summary (Last 24 hours) at  05/29/2019 0606 Last data filed at 05/29/2019 0400 Gross per 24 hour  Intake 2417.02 ml  Output 3130 ml  Net -712.98 ml   Filed Weights   05/27/19 0500 05/28/19 0500 05/29/19 0455  Weight: 100.1 kg 101.5 kg 102 kg   Examination:  General: Ill appearing male, sedated HENT: ETT in place, purulent discharge around the trach Pulm: Good air movement with course breath sounds throughout  CV: RRR, no murmurs, no rubs  Abdomen: Active bowel sounds, soft, non-distended, no tenderness to palpation  Extremities: Pulses palpable in all extremities, no LE edema  Skin: Warm and dry  Neuro: Alert and oriented x 3  No new imaging studies.   Respiratory cultures with Klebsiella and Staph. Klebsiella sensitive to Zosyn.  Up trending triglycerides. BMP stable. Leukocytosis stable.   L PIV 4/18  R PIV 4/24  Urinary catheter 4/24 Trach 51mm 4/18   Resolved Hospital Problem list     Assessment & Plan:   Acute hypoxemic respiratory failure requiring mechanical ventilation secondary to Covid pneumonia.   Superimposed MSSA, Klebsiella, and haemophilus pneumonia.   - Continue Zosyn with Klebsiella and staph on respiratory cultures from 4/23  - Continue chest PT every 4 hours - Continue full vent support, 4-8 cc/kg ideal body weight with goal plateau less than 30 and driving pressure less than 15.  Titrate PEEP and FiO2 per ARDS ladder.  Tolerating vent dyssynchrony. - Continue oxycodone 10 mg every 6 hours, Clonazepam 1 mg BID, and Seroquel 100 mg BID.  - With triglycerides increasing will need to DC propofol. Use fentanyl  - Off Covid Precautions as of 4/21 - VAP prevention protocol - Continue to diurese with IV furosemide 80 mg BID  Acute encephalopathy, multifactorial metabolic/hypoxic resp failure:  - Challenging to control with desats and tachypnea when fentanyl or propofol weaned.  - Continue oxycodone 10 mg every 6 hours, Clonazepam 1 mg BID, and Seroquel 100 mg BID . COVID-19  pneumonia - Previously completed remdesivir and dexamethasone - Off precautions as of 4/21  Hypernatremia:  - Cont free water flushes to 250 cc every 4 hours - Continue to monitor, BMET daily  Prediabetes, A1c 6.4 - Continue Accu-Cheks every 4 hours with sliding scale insulin as needed - Increase Lantus 25 units daily - Goal BG 140-180 while admitted to the ICU  Macrocytic anemia, normal B12 - Folate - Transfuse for hemoglobin less than 7, no current indication. - Continue to monitor  Prolonged QTc at admission - EKG 4/21 with qtc in 470's - Maintain Seroquel with close QTc monitoring   Muscular deconditioning- PT/OT consults, pt remains too somnolent at this time but as improves, either chair position in bed or move to chair  Best practice:  Diet: Tube feeds Pain/Anxiety/Delirium protocol (if indicated): wean Fluid status goals: even to negative as renal function allows VAP protocol (if indicated): In place DVT prophylaxis: Lovenox Central lines: none GU: condom catheter. GI prophylaxis: Protonix Glucose control: SSI Mobility: Bedrest Code Status: Full code Family Communication: Wife updated at bedside Disposition: ICU   Please see attending attestation for final recommendations.   Ina Homes, MD IMTS PGY3  Pager: [phone] 05/29/2019 6:06 AM

## 2019-05-29 NOTE — Care Management (Addendum)
CM reached out to both [facility] and [facility] for potential re-screening for [facility]. Original LTACH referral given to both facilities by CM WG.   As of today both [facility] and [facility] can offer a bed.  CM discussed opportunity with wife and provided choice.  Both [facility] and [facility] to reach out to pts wife today to discuss facility. CM will follow up with pts wife in the am

## 2019-05-30 ENCOUNTER — Inpatient Hospital Stay (HOSPITAL_COMMUNITY): Payer: PPO

## 2019-05-30 LAB — CBC WITH DIFFERENTIAL/PLATELET
Abs Immature Granulocytes: 2.68 10*3/uL — ABNORMAL HIGH (ref 0.00–0.07)
Basophils Absolute: 0.2 10*3/uL — ABNORMAL HIGH (ref 0.0–0.1)
Basophils Relative: 1 %
Eosinophils Absolute: 0.1 10*3/uL (ref 0.0–0.5)
Eosinophils Relative: 0 %
HCT: 31.5 % — ABNORMAL LOW (ref 39.0–52.0)
Hemoglobin: 9.2 g/dL — ABNORMAL LOW (ref 13.0–17.0)
Immature Granulocytes: 12 %
Lymphocytes Relative: 8 %
Lymphs Abs: 1.9 10*3/uL (ref 0.7–4.0)
MCH: 29.6 pg (ref 26.0–34.0)
MCHC: 29.2 g/dL — ABNORMAL LOW (ref 30.0–36.0)
MCV: 101.3 fL — ABNORMAL HIGH (ref 80.0–100.0)
Monocytes Absolute: 0.9 10*3/uL (ref 0.1–1.0)
Monocytes Relative: 4 %
Neutro Abs: 17.4 10*3/uL — ABNORMAL HIGH (ref 1.7–7.7)
Neutrophils Relative %: 75 %
Platelets: 519 10*3/uL — ABNORMAL HIGH (ref 150–400)
RBC: 3.11 MIL/uL — ABNORMAL LOW (ref 4.22–5.81)
RDW: 16.4 % — ABNORMAL HIGH (ref 11.5–15.5)
WBC: 23 10*3/uL — ABNORMAL HIGH (ref 4.0–10.5)
nRBC: 0.8 % — ABNORMAL HIGH (ref 0.0–0.2)

## 2019-05-30 LAB — CBC
HCT: 31.1 % — ABNORMAL LOW (ref 39.0–52.0)
Hemoglobin: 9.3 g/dL — ABNORMAL LOW (ref 13.0–17.0)
MCH: 29.8 pg (ref 26.0–34.0)
MCHC: 29.9 g/dL — ABNORMAL LOW (ref 30.0–36.0)
MCV: 99.7 fL (ref 80.0–100.0)
Platelets: 478 10*3/uL — ABNORMAL HIGH (ref 150–400)
RBC: 3.12 MIL/uL — ABNORMAL LOW (ref 4.22–5.81)
RDW: 16.3 % — ABNORMAL HIGH (ref 11.5–15.5)
WBC: 22.8 10*3/uL — ABNORMAL HIGH (ref 4.0–10.5)
nRBC: 0.7 % — ABNORMAL HIGH (ref 0.0–0.2)

## 2019-05-30 LAB — BASIC METABOLIC PANEL
Anion gap: 9 (ref 5–15)
BUN: 39 mg/dL — ABNORMAL HIGH (ref 6–20)
CO2: 35 mmol/L — ABNORMAL HIGH (ref 22–32)
Calcium: 9.2 mg/dL (ref 8.9–10.3)
Chloride: 98 mmol/L (ref 98–111)
Creatinine, Ser: 0.63 mg/dL (ref 0.61–1.24)
GFR calc Af Amer: >60 mL/min (ref >60)
GFR calc non Af Amer: >60 mL/min (ref >60)
Glucose, Bld: 173 mg/dL — ABNORMAL HIGH (ref 70–99)
Potassium: 3 mmol/L — ABNORMAL LOW (ref 3.5–5.1)
Sodium: 142 mmol/L (ref 135–145)

## 2019-05-30 LAB — GLUCOSE, CAPILLARY
Glucose-Capillary: 191 mg/dL — ABNORMAL HIGH (ref 70–99)
Glucose-Capillary: 162 mg/dL — ABNORMAL HIGH (ref 70–99)
Glucose-Capillary: 153 mg/dL — ABNORMAL HIGH (ref 70–99)
Glucose-Capillary: 206 mg/dL — ABNORMAL HIGH (ref 70–99)
Glucose-Capillary: 159 mg/dL — ABNORMAL HIGH (ref 70–99)

## 2019-05-30 LAB — PROCALCITONIN: Procalcitonin: 1.48 ng/mL

## 2019-05-30 MED ORDER — ACETAZOLAMIDE 250 MG PO TABS
500.0000 mg | ORAL_TABLET | Freq: Once | ORAL | Status: AC
  Administered 2019-05-30: 20:00:00 500 mg
  Filled 2019-05-30: qty 2

## 2019-05-30 MED ORDER — ACETAZOLAMIDE ORAL SUSPENSION 25 MG/ML
500.0000 mg | Freq: Once | ORAL | Status: AC
  Filled 2019-05-30: qty 20

## 2019-05-30 MED ORDER — ACETAZOLAMIDE ORAL SUSPENSION 25 MG/ML
500.0000 mg | Freq: Two times a day (BID) | ORAL | Status: DC
  Administered 2019-05-30: 500 mg
  Filled 2019-05-30 (×6): qty 20

## 2019-05-30 MED ORDER — POTASSIUM CHLORIDE 20 MEQ/15ML (10%) PO SOLN
40.0000 meq | Freq: Two times a day (BID) | ORAL | Status: AC
  Administered 2019-05-30 (×2): 40 meq
  Filled 2019-05-30 (×2): qty 30

## 2019-05-30 MED ORDER — POTASSIUM CHLORIDE CRYS ER 20 MEQ PO TBCR: 40.0000 meq | EXTENDED_RELEASE_TABLET | Freq: Two times a day (BID) | ORAL | Status: DC

## 2019-05-30 NOTE — Progress Notes (Addendum)
NAME:  Matthew Eaton, MRN:  [id number], DOB:  06-07-1960, LOS: 5 ADMISSION DATE:  05/10/2019, CONSULTATION DATE:  05/11/2019 REFERRING MD:  [facility], CHIEF COMPLAINT:  hypoxemia   Brief History    59 year old with a 9-day history of slowly worsening Covid pneumonia despite appropriate therapy Transferred to unit, intubated 05/11/2019  History of present illness    Patient is a 59 year old male initially admitted 3/31 to 05/27/2019 for Covid pneumonia.  He signed out AMA the evening of 05/07/2019.  Then returned the evening of 4/5 complaining of worsening shortness of breath.  Presenting saturations at that time were in the 80s and he came up into the mid 90s on 100% oxygen.  We were asked to see early this morning in regards to worsening hypoxemia. The patient was noncompliant with his oxygen.  On high flow nasal cannula at 100% the patient had an oxygen saturation of about 79%. With the addition of 100% facemask the patient had oxygen saturation in the mid 80s.  He was verbal throughout and did not look particularly dyspneic.  Patient was urgently transferred to the ICU and intubated.  At the time of this dictation he is ventilated on 100% with an oxygen saturation between 85 and 95%. He completed his Decadron, remdesivir, and Actemra.  Chest x-ray performed earlier this morning shows worsening infiltrate particularly throughout the right lung.  Past Medical History   Past Medical History:  Diagnosis Date  . Chronic neck pain 05/20/2012  . Family hx of ALS (amyotrophic lateral sclerosis) 05/27/2013  . GERD (gastroesophageal reflux disease)   . Hiatal hernia   . HLD (hyperlipidemia)   . Major depressive disorder, recurrent episode, moderate ([place]) 05/27/2013  . Neck pain    cervical spine with herniation  . Tobacco abuse   . Wrist fracture, left    Significant Hospital Events    Transferred to ICU intubation 05/11/2019 4/11-proning 4/12-tolerating supine  ventilation 4/13-tolerating PEEP wean. 4/18- trach to facilitate vent wean 4/22: ok to come off covid precautions 4/22: Transferred out of 70M  Consults:  [facility], PCCM  Procedures:  Intubation 05/11/2019 Central line placement 05/11/2019 4/18 trach  Significant Diagnostic Tests:  4/6 ctpa: 1. No demonstrable pulmonary embolus. No thoracic aortic aneurysm. There are foci of aortic atherosclerosis.  2. Widespread ground-glass type opacity consistent with extensive multifocal pneumonia, slightly increased in the right middle lobe and stable albeit extensive elsewhere. Overall there is more infiltrate on the left than on the right, stable. Underlying degree of emphysema.  3.  No evident adenopathy.  4.  Stable moderate hiatal hernia.  5.  No evident adenopathy.  4/7 echo: LVEF 123456, grade I diastolic dysfunc 4/7 LE venous duplex: negative bilaterally for DVT  Micro Data:  4/8 Respiratory cultures-abundant MSSA, Haemophilus influenzae Covid PCR positive 3/31 4/18 resp: MSSA 4/23: Blood culutres >> NGTD 4/23: Respiratory cultures >> Klebsiella and Staph  4/23: Urine cultures >> NGTD  Antimicrobials:  Cefepime 4/7-4/10 Ampicillin 4/10-4/11 Augmentin 4/12-4/16 Remdesivir-completed vanc 4/19->4/20 Cefazolin 4/20 >> 4/23 Zosyn 4/23 >> Current  Interim history/subjective:  Per nursing: Issues with sedation overnight. Was bringing head off the bed, kicking, and swinging his arms.   Objective   Blood pressure 135/70, pulse 95, temperature 98.7 F (37.1 C), temperature source Axillary, resp. rate 20, height 5\' 10"  (1.778 m), weight 102.5 kg, SpO2 95 %.    Vent Mode: PRVC FiO2 (%):  [40 %-50 %] 50 % Set Rate:  [24 bmp] 24 bmp Vt Set:  [id number]  mL] 550 mL PEEP:  [14 cmH20] 14 cmH20 Plateau Pressure:  [20 cmH20-26 cmH20] 26 cmH20    Intake/Output Summary (Last 24 hours) at 05/30/2019 [id number] Last data filed at 05/30/2019 0600 Gross per 24 hour  Intake 4884.17 ml   Output 3605 ml  Net 1279.17 ml   Filed Weights   05/28/19 0500 05/29/19 0455 05/30/19 0500  Weight: 101.5 kg 102 kg 102.5 kg   Examination:  General: Ill appearing male, sedated HENT: ETT in place, purulent discharge around the trach Pulm: Good air movement with course breath sounds throughout  CV: RRR, no murmurs, no rubs  Abdomen: Active bowel sounds, soft, non-distended, no tenderness to palpation  Extremities: Pulses palpable in all extremities, no LE edema  Skin: Warm and dry  Neuro: Alert and oriented x 3  No new imaging studies.   BMP with hypokalemia. CBC with worsening leukocytosis.   L PIV 4/18  Urinary catheter 4/24 Trach 48mm 4/18  R PICC 4/26  Resolved Hospital Problem list     Assessment & Plan:   Acute hypoxemic respiratory failure requiring mechanical ventilation secondary to Covid pneumonia.   Superimposed MSSA, Klebsiella, and haemophilus pneumonia.   - Continue Cefazolin with Klebsiella and staph on respiratory cultures from 4/23 (Day 8/10) - Continue chest PT every 4 hours - Continue full vent support, 4-8 cc/kg ideal body weight with goal plateau less than 30 and driving pressure less than 15.  Titrate PEEP and FiO2 per ARDS ladder.  Tolerating vent dyssynchrony. - Continue oxycodone 10 mg every 6 hours and Clonazepam 1 mg TID. Seroquel stopped yesterday due to prolonged QTc - Wean fentanyl as tolerated - Off Covid Precautions as of 4/21 - VAP prevention protocol - Continue to diurese with IV furosemide 80 mg BID  Acute encephalopathy, multifactorial metabolic/hypoxic resp failure:  - Challenging to control with desats and tachypnea when fentanyl or propofol weaned.  - Continue oxycodone 10 mg every 6 hours and Clonazepam 1 mg TID. Seroquel stopped yesterday due to prolonged QTc - Could consider adding Zyprexa  . COVID-19 pneumonia - Previously completed remdesivir and dexamethasone - Off precautions as of 4/21  Hypernatremia:  - Cont free  water flushes to 250 cc every 4 hours - Continue to monitor, BMET daily  Prediabetes, A1c 6.4 - Continue Accu-Cheks every 4 hours with sliding scale insulin as needed - Continue Lantus 20 units daily and SSI - Goal BG 140-180 while admitted to the ICU  Macrocytic anemia, normal B12 - Folate - Transfuse for hemoglobin less than 7, no current indication. - Continue to monitor  Prolonged QTc at admission - EKG 4/26 with qtc in 520's - Seroquel stopped yesterday due to prolonged QTc  Muscular deconditioning- PT/OT consults, pt remains too somnolent at this time but as improves, either chair position in bed or move to chair  Best practice:  Diet: Tube feeds Pain/Anxiety/Delirium protocol (if indicated): wean Fluid status goals: even to negative as renal function allows VAP protocol (if indicated): In place DVT prophylaxis: Lovenox Central lines: none GU: condom catheter. GI prophylaxis: Protonix Glucose control: SSI Mobility: Bedrest Code Status: Full code Family Communication: Wife updated at bedside Disposition: ICU   Please see attending attestation for final recommendations.   Ina Homes, MD IMTS PGY3  Pager: [phone] 05/30/2019 7:22 AM

## 2019-05-30 NOTE — Care Management (Addendum)
Pt wife has chosen [facility].  CM requested insurance auth for pt via [employer].  [employer] to follow up with CM

## 2019-05-30 NOTE — Progress Notes (Signed)
Received pt. Safe in bed, off propofol, on fentanyl gtt only at 185mcg/hr.  Patient awake with eyes open.  Unresponsive to startle reflex or any kind of stimulation.  Staring straight up, shaking head, moving legs and arms, and very noncompliant with vent.  Patient looks very uncomfortable.  Increased fentanyl gtt, and later gave 2mg  versed IVP.  vss at this time and patient is vent compliant, and looks very comfortable.  Continuing to closely monitor

## 2019-05-30 NOTE — Progress Notes (Signed)
  Speech Language Pathology  Patient Details Name: Matthew Eaton MRN: [id number] DOB: [birth year] Today's Date: 05/30/2019 Time:  -      Reviewed pt's chart and spoke with pt's RN. He continues with sedation and full vent support and has been unable to wean sedation or respiratory requirements. Will plan to follow up first of next week for need to request PMV orders.                    Houston Siren 05/30/2019, 4:58 PM   Orbie Pyo Colvin Caroli.Ed Risk analyst [phone] Office [phone]

## 2019-05-31 ENCOUNTER — Inpatient Hospital Stay (HOSPITAL_COMMUNITY): Payer: PPO

## 2019-05-31 LAB — CULTURE, BLOOD (ROUTINE X 2)
Culture: NO GROWTH
Culture: NO GROWTH

## 2019-05-31 LAB — BASIC METABOLIC PANEL
Anion gap: 8 (ref 5–15)
BUN: 39 mg/dL — ABNORMAL HIGH (ref 6–20)
CO2: 35 mmol/L — ABNORMAL HIGH (ref 22–32)
Calcium: 9.2 mg/dL (ref 8.9–10.3)
Chloride: 99 mmol/L (ref 98–111)
Creatinine, Ser: 0.64 mg/dL (ref 0.61–1.24)
GFR calc Af Amer: >60 mL/min (ref >60)
GFR calc non Af Amer: >60 mL/min (ref >60)
Glucose, Bld: 168 mg/dL — ABNORMAL HIGH (ref 70–99)
Potassium: 3.2 mmol/L — ABNORMAL LOW (ref 3.5–5.1)
Sodium: 142 mmol/L (ref 135–145)

## 2019-05-31 LAB — CBC
HCT: 29 % — ABNORMAL LOW (ref 39.0–52.0)
Hemoglobin: 8.6 g/dL — ABNORMAL LOW (ref 13.0–17.0)
MCH: 30.4 pg (ref 26.0–34.0)
MCHC: 29.7 g/dL — ABNORMAL LOW (ref 30.0–36.0)
MCV: 102.5 fL — ABNORMAL HIGH (ref 80.0–100.0)
Platelets: 380 10*3/uL (ref 150–400)
RBC: 2.83 MIL/uL — ABNORMAL LOW (ref 4.22–5.81)
RDW: 16.5 % — ABNORMAL HIGH (ref 11.5–15.5)
WBC: 19.1 10*3/uL — ABNORMAL HIGH (ref 4.0–10.5)
nRBC: 0.7 % — ABNORMAL HIGH (ref 0.0–0.2)

## 2019-05-31 LAB — GLUCOSE, CAPILLARY
Glucose-Capillary: 133 mg/dL — ABNORMAL HIGH (ref 70–99)
Glucose-Capillary: 147 mg/dL — ABNORMAL HIGH (ref 70–99)
Glucose-Capillary: 165 mg/dL — ABNORMAL HIGH (ref 70–99)
Glucose-Capillary: 173 mg/dL — ABNORMAL HIGH (ref 70–99)
Glucose-Capillary: 176 mg/dL — ABNORMAL HIGH (ref 70–99)
Glucose-Capillary: 189 mg/dL — ABNORMAL HIGH (ref 70–99)
Glucose-Capillary: 92 mg/dL (ref 70–99)

## 2019-05-31 LAB — PROCALCITONIN: Procalcitonin: 1.26 ng/mL

## 2019-05-31 MED ORDER — POTASSIUM CHLORIDE 20 MEQ/15ML (10%) PO SOLN
40.0000 meq | Freq: Two times a day (BID) | ORAL | Status: DC
  Administered 2019-05-31: 40 meq
  Filled 2019-05-31 (×2): qty 30

## 2019-05-31 MED ORDER — POTASSIUM CHLORIDE 20 MEQ/15ML (10%) PO SOLN
30.0000 meq | ORAL | Status: AC
  Administered 2019-05-31 (×2): 30 meq
  Filled 2019-05-31 (×2): qty 30

## 2019-05-31 MED ORDER — ACETAZOLAMIDE 250 MG PO TABS
500.0000 mg | ORAL_TABLET | Freq: Two times a day (BID) | ORAL | Status: DC
  Administered 2019-05-31 – 2019-06-03 (×7): 500 mg
  Filled 2019-05-31 (×7): qty 2

## 2019-05-31 NOTE — Progress Notes (Signed)
RT NOTE: RT transported patient on ventilator from room 2M05 to CT and back to room 0000000 with no complications. Vitals are stable. RT will continue to monitor.

## 2019-05-31 NOTE — Progress Notes (Signed)
NAME:  Matthew Eaton, MRN:  [id number], DOB:  02-26-1960, LOS: 91 ADMISSION DATE:  05/13/2019, CONSULTATION DATE:  05/11/2019 REFERRING MD:  [facility], CHIEF COMPLAINT:  hypoxemia   Brief History    59 year old with a 9-day history of slowly worsening Covid pneumonia despite appropriate therapy Transferred to unit, intubated 05/11/2019  History of present illness    Patient is a 59 year old male initially admitted 3/31 to 05/11/2019 for Covid pneumonia.  He signed out AMA the evening of 05/05/2019.  Then returned the evening of 4/5 complaining of worsening shortness of breath.  Presenting saturations at that time were in the 80s and he came up into the mid 90s on 100% oxygen.  We were asked to see early this morning in regards to worsening hypoxemia. The patient was noncompliant with his oxygen.  On high flow nasal cannula at 100% the patient had an oxygen saturation of about 79%. With the addition of 100% facemask the patient had oxygen saturation in the mid 80s.  He was verbal throughout and did not look particularly dyspneic.  Patient was urgently transferred to the ICU and intubated.  At the time of this dictation he is ventilated on 100% with an oxygen saturation between 85 and 95%. He completed his Decadron, remdesivir, and Actemra.  Chest x-ray performed earlier this morning shows worsening infiltrate particularly throughout the right lung.  Past Medical History   Past Medical History:  Diagnosis Date  . Chronic neck pain 05/20/2012  . Family hx of ALS (amyotrophic lateral sclerosis) 05/27/2013  . GERD (gastroesophageal reflux disease)   . Hiatal hernia   . HLD (hyperlipidemia)   . Major depressive disorder, recurrent episode, moderate ([place]) 05/27/2013  . Neck pain    cervical spine with herniation  . Tobacco abuse   . Wrist fracture, left    Significant Hospital Events    Transferred to ICU intubation 05/11/2019 4/11-proning 4/12-tolerating supine  ventilation 4/13-tolerating PEEP wean. 4/18- trach to facilitate vent wean 4/22: ok to come off covid precautions 4/22: Transferred out of 54M  Consults:  [facility], PCCM  Procedures:  Intubation 05/11/2019 Central line placement 05/11/2019 4/18 trach  Significant Diagnostic Tests:  4/6 ctpa: 1. No demonstrable pulmonary embolus. No thoracic aortic aneurysm. There are foci of aortic atherosclerosis.  2. Widespread ground-glass type opacity consistent with extensive multifocal pneumonia, slightly increased in the right middle lobe and stable albeit extensive elsewhere. Overall there is more infiltrate on the left than on the right, stable. Underlying degree of emphysema.  3.  No evident adenopathy.  4.  Stable moderate hiatal hernia.  5.  No evident adenopathy.  4/7 echo: LVEF 123456, grade I diastolic dysfunc 4/7 LE venous duplex: negative bilaterally for DVT  Micro Data:  4/8 Respiratory cultures-abundant MSSA, Haemophilus influenzae Covid PCR positive 3/31 4/18 resp: MSSA 4/23: Blood culutres >> NGTD 4/23: Respiratory cultures >> Klebsiella and Staph  4/23: Urine cultures >> NGTD  Antimicrobials:  Cefepime 4/7-4/10 Ampicillin 4/10-4/11 Augmentin 4/12-4/16 Remdesivir-completed vanc 4/19->4/20 Cefazolin 4/20 >> 4/23 Zosyn 4/23 >> 4/26 Cefazolin 4/26 > 4/28  Interim history/subjective:  Per nursing: No issues overnight. No needs anticipated.   Objective   Blood pressure 125/73, pulse 100, temperature 99 F (37.2 C), temperature source Oral, resp. rate (!) 23, height 5\' 10"  (1.778 m), weight 106.2 kg, SpO2 97 %.    Vent Mode: PRVC FiO2 (%):  [50 %-70 %] 60 % Set Rate:  [24 bmp] 24 bmp Vt Set:  [550 mL] 550 mL PEEP:  [  [place] Pressure:  [26 cmH20-27 cmH20] 27 cmH20    Intake/Output Summary (Last 24 hours) at 05/31/2019 0717 Last data filed at 05/31/2019 0600 Gross per 24 hour  Intake 3984.78 ml  Output 4180 ml  Net  -195.22 ml   Filed Weights   05/29/19 0455 05/30/19 0500 05/31/19 0409  Weight: 102 kg 102.5 kg 106.2 kg   Examination:  General: Ill appearing male, sedated HENT: Trach in place, improvingpurulent discharge around the trach Pulm: Good air movement with course breath sounds throughout  CV: RRR, no murmurs, no rubs  Abdomen: Active bowel sounds, soft, non-distended, no tenderness to palpation  Extremities: Diffuse edema in all extremity  Skin: Warm and dry  Neuro: Unresponsive  CXR with progression of bilateral infiltrates with R > L.   BMP with hypokalemia. CBC with improving leukocytosis. Procalcitonin continues to be elevated (1.48 -> 1.26)  L PIV 4/18  Urinary catheter 4/24 Trach 73mm 4/18  R PICC 4/26  Resolved Hospital Problem list     Assessment & Plan:   Acute hypoxemic respiratory failure requiring mechanical ventilation secondary to Covid pneumonia.   Superimposed MSSA, Klebsiella, and haemophilus pneumonia.   - Continue Cefazolin with Klebsiella and staph on respiratory cultures from 4/23 (Day 10/10) - Continue chest PT every 4 hours - Continue full vent support, 4-8 cc/kg ideal body weight with goal plateau less than 30 and driving pressure less than 15.  Titrate PEEP and FiO2 per ARDS ladder.  Tolerating vent dyssynchrony. - Continue oxycodone 10 mg every 6 hours and Clonazepam 1 mg TID.  - Seroquel stopped due to prolonged QTc - Wean fentanyl as tolerated - Off Covid Precautions as of 4/21 - VAP prevention protocol - Continue to diurese with IV furosemide 80 mg BID  Acute encephalopathy, multifactorial metabolic/hypoxic resp failure:  - Challenging to control with desats and tachypnea when fentanyl or propofol weaned.  - Continue oxycodone 10 mg every 6 hours and Clonazepam 1 mg TID.  - Seroquel stopped due to prolonged QTc - Could consider adding Zyprexa  . COVID-19 pneumonia - Previously completed remdesivir and dexamethasone - Off precautions as of  4/21  Hypernatremia:  - Cont free water flushes to 250 cc every 4 hours - Continue to monitor, BMET daily  Prediabetes, A1c 6.4 - Continue Accu-Cheks every 4 hours with sliding scale insulin as needed - Continue Lantus 20 units daily and SSI - Goal BG 140-180 while admitted to the ICU  Macrocytic anemia, normal B12 - Folate - Transfuse for hemoglobin less than 7, no current indication. - Continue to monitor  Prolonged QTc at admission - EKG 4/26 with qtc in 520's - Seroquel stopped due to prolonged QTc  Hypokalemia  - Replete  Muscular deconditioning- PT/OT consults, pt remains too somnolent at this time but as improves, either chair position in bed or move to chair  Best practice:  Diet: Tube feeds Pain/Anxiety/Delirium protocol (if indicated): wean Fluid status goals: even to negative as renal function allows VAP protocol (if indicated): In place DVT prophylaxis: Lovenox Central lines: none GU: condom catheter. GI prophylaxis: Protonix Glucose control: SSI Mobility: Bedrest Code Status: Full code Family Communication: Wife updated at bedside Disposition: ICU (working on [facility] placement)  Please see attending attestation for final recommendations.   Ina Homes, MD IMTS PGY3  Pager: [phone] 05/31/2019 7:17 AM

## 2019-05-31 NOTE — Progress Notes (Signed)
[facility] ADULT ICU REPLACEMENT PROTOCOL FOR AM LAB REPLACEMENT ONLY  The patient does apply for the [facility] Adult ICU Electrolyte Replacment Protocol based on the criteria listed below:   1. Is GFR >/= 40 ml/min? Yes.    Patient's GFR today is >60 2. Is urine output >/= 0.5 ml/kg/hr for the last 6 hours? Yes.   Patient's UOP is 1.1 ml/kg/hr 3. Is BUN < 60 mg/dL? Yes.    Patient's BUN today is 39 4. Abnormal electrolyte(s) K-3.2 5. Ordered repletion with: per protocol 6. If a panic level lab has been reported, has the CCM MD in charge been notified? Yes.  .   Physician:  Dr Jonetta Speak, Philis Nettle 05/31/2019 5:29 AM

## 2019-05-31 NOTE — Care Management (Addendum)
Attempt to obtain auth for [facility] from [employer] at [id number] and [phone]. Sent to unidentified VM. Will try again today after lunch hours.   Called [phone], Josem Kaufmann is still pending at this time.

## 2019-06-01 LAB — BASIC METABOLIC PANEL
Anion gap: 11 (ref 5–15)
BUN: 39 mg/dL — ABNORMAL HIGH (ref 6–20)
CO2: 33 mmol/L — ABNORMAL HIGH (ref 22–32)
Calcium: 8.6 mg/dL — ABNORMAL LOW (ref 8.9–10.3)
Chloride: 100 mmol/L (ref 98–111)
Creatinine, Ser: 0.7 mg/dL (ref 0.61–1.24)
GFR calc Af Amer: >60 mL/min (ref >60)
GFR calc non Af Amer: >60 mL/min (ref >60)
Glucose, Bld: 149 mg/dL — ABNORMAL HIGH (ref 70–99)
Potassium: 3.4 mmol/L — ABNORMAL LOW (ref 3.5–5.1)
Sodium: 144 mmol/L (ref 135–145)

## 2019-06-01 LAB — CBC
HCT: 27.1 % — ABNORMAL LOW (ref 39.0–52.0)
Hemoglobin: 7.9 g/dL — ABNORMAL LOW (ref 13.0–17.0)
MCH: 29.8 pg (ref 26.0–34.0)
MCHC: 29.2 g/dL — ABNORMAL LOW (ref 30.0–36.0)
MCV: 102.3 fL — ABNORMAL HIGH (ref 80.0–100.0)
Platelets: 370 10*3/uL (ref 150–400)
RBC: 2.65 MIL/uL — ABNORMAL LOW (ref 4.22–5.81)
RDW: 16.7 % — ABNORMAL HIGH (ref 11.5–15.5)
WBC: 15.2 10*3/uL — ABNORMAL HIGH (ref 4.0–10.5)
nRBC: 0.7 % — ABNORMAL HIGH (ref 0.0–0.2)

## 2019-06-01 LAB — GLUCOSE, CAPILLARY
Glucose-Capillary: 134 mg/dL — ABNORMAL HIGH (ref 70–99)
Glucose-Capillary: 145 mg/dL — ABNORMAL HIGH (ref 70–99)
Glucose-Capillary: 148 mg/dL — ABNORMAL HIGH (ref 70–99)
Glucose-Capillary: 143 mg/dL — ABNORMAL HIGH (ref 70–99)
Glucose-Capillary: 98 mg/dL (ref 70–99)
Glucose-Capillary: 130 mg/dL — ABNORMAL HIGH (ref 70–99)
Glucose-Capillary: 129 mg/dL — ABNORMAL HIGH (ref 70–99)
Glucose-Capillary: 188 mg/dL — ABNORMAL HIGH (ref 70–99)

## 2019-06-01 LAB — D-DIMER, QUANTITATIVE: D-Dimer, Quant: 6.67 ug/mL-FEU — ABNORMAL HIGH (ref 0.00–0.50)

## 2019-06-01 MED ORDER — ENOXAPARIN SODIUM 40 MG/0.4ML ~~LOC~~ SOLN: 40.0000 mg | Freq: Two times a day (BID) | SUBCUTANEOUS | Status: DC

## 2019-06-01 MED ORDER — CLONAZEPAM 1 MG PO TABS
2.0000 mg | ORAL_TABLET | Freq: Two times a day (BID) | ORAL | Status: DC
  Administered 2019-06-01 – 2019-06-06 (×10): 2 mg
  Filled 2019-06-01 (×6): qty 2
  Filled 2019-06-01: qty 4
  Filled 2019-06-01 (×2): qty 2
  Filled 2019-06-01: qty 4
  Filled 2019-06-01 (×2): qty 2

## 2019-06-01 MED ORDER — POTASSIUM CHLORIDE 20 MEQ/15ML (10%) PO SOLN
20.0000 meq | ORAL | Status: AC
  Administered 2019-06-01 (×2): 20 meq
  Filled 2019-06-01 (×2): qty 15

## 2019-06-01 MED ORDER — CLONAZEPAM 1 MG PO TABS
1.0000 mg | ORAL_TABLET | Freq: Once | ORAL | Status: AC
  Administered 2019-06-01: 1 mg

## 2019-06-01 NOTE — Progress Notes (Signed)
NAME:  Matthew Eaton, MRN:  [id number], DOB:  11/22/1960, LOS: 24 ADMISSION DATE:  05/07/2019, CONSULTATION DATE:  05/11/2019 REFERRING MD:  [facility], CHIEF COMPLAINT:  hypoxemia   Brief History    59 year old with a 9-day history of slowly worsening Covid pneumonia despite appropriate therapy Transferred to unit, intubated 05/11/2019  History of present illness    Patient is a 59 year old male initially admitted 3/31 to 05/22/2019 for Covid pneumonia.  He signed out AMA the evening of 05/14/2019.  Then returned the evening of 4/5 complaining of worsening shortness of breath.  Presenting saturations at that time were in the 80s and he came up into the mid 90s on 100% oxygen.  We were asked to see early this morning in regards to worsening hypoxemia. The patient was noncompliant with his oxygen.  On high flow nasal cannula at 100% the patient had an oxygen saturation of about 79%. With the addition of 100% facemask the patient had oxygen saturation in the mid 80s.  He was verbal throughout and did not look particularly dyspneic.  Patient was urgently transferred to the ICU and intubated.  At the time of this dictation he is ventilated on 100% with an oxygen saturation between 85 and 95%. He completed his Decadron, remdesivir, and Actemra.  Chest x-ray performed earlier this morning shows worsening infiltrate particularly throughout the right lung.  Past Medical History   Past Medical History:  Diagnosis Date  . Chronic neck pain 05/20/2012  . Family hx of ALS (amyotrophic lateral sclerosis) 05/27/2013  . GERD (gastroesophageal reflux disease)   . Hiatal hernia   . HLD (hyperlipidemia)   . Major depressive disorder, recurrent episode, moderate ([place]) 05/27/2013  . Neck pain    cervical spine with herniation  . Tobacco abuse   . Wrist fracture, left    Significant Hospital Events    Transferred to ICU intubation 05/11/2019 4/11-proning 4/12-tolerating supine ventilation  4/13-tolerating PEEP wean. 4/18- trach to facilitate vent wean 4/22: ok to come off covid precautions 4/22: Transferred out of 5M  Consults:  [facility], PCCM  Procedures:  Intubation 05/11/2019 Central line placement 05/11/2019 4/18 trach  Significant Diagnostic Tests:  4/6 ctpa:  1. No demonstrable pulmonary embolus. No thoracic aortic aneurysm. There are foci of aortic atherosclerosis. 2. Widespread ground-glass type opacity consistent with extensive multifocal pneumonia, slightly increased in the right middle lobe and stable albeit extensive elsewhere. Overall there is more infiltrate on the left than on the right, stable. Underlying degree of emphysema. 3.  No evident adenopathy. 4.  Stable moderate hiatal hernia. 5.  No evident adenopathy.  4/7 echo: LVEF [id number], grade I diastolic dysfunc 4/7 LE venous duplex: negative bilaterally for DVT  4/28 CT Chest >> 1. Progressive changes related to the known COVID-19 infection although some new superimposed infiltrates are noted primarily within the lower lobes and right upper lobe as described. 2. New complicated effusion on the right consistent with empyema. Percutaneous drainage may be helpful. 3. Aortic Atherosclerosis (ICD10-I70.0) and Emphysema (ICD10-J43.9).  Micro Data:  4/8 Respiratory cultures-abundant MSSA, Haemophilus influenzae Covid PCR positive 3/31 4/18 resp: MSSA 4/23: Blood culutres >> NGTD 4/23: Respiratory cultures >> Klebsiella and Staph  4/23: Urine cultures >> NGTD  Antimicrobials:  Cefepime 4/7-4/10 Ampicillin 4/10-4/11 Augmentin 4/12-4/16 Remdesivir-completed vanc 4/19->4/20 Cefazolin 4/20 >> 4/23 Zosyn 4/23 >> 4/26 Cefazolin 4/26 > Current  Interim history/subjective:  Per nursing: Febrile overnight. o issues overnight. No needs anticipated.   Objective   Blood pressure (!) 106/59,  pulse 98, temperature 100.2 F (37.9 C), resp. rate (!) 25, height 5\' 10"  (1.778 m), weight 100.6  kg, SpO2 97 %.    Vent Mode: PRVC FiO2 (%):  [50 %-60 %] 50 % Set Rate:  [24 bmp] 24 bmp Vt Set:  [550 mL] 550 mL PEEP:  [10 cmH20] 10 cmH20 Plateau Pressure:  [23 cmH20-25 cmH20] 25 cmH20    Intake/Output Summary (Last 24 hours) at 06/01/2019 [id number] Last data filed at 06/01/2019 0600 Gross per 24 hour  Intake 3230.56 ml  Output 3185 ml  Net 45.56 ml   Filed Weights   05/30/19 0500 05/31/19 0409 06/01/19 0332  Weight: 102.5 kg 106.2 kg 100.6 kg   Examination:  General: Ill appearing male, sedated HENT: Trach in place, improving purulent discharge around the trach Pulm: Good air movement with course breath sounds throughout  CV: RRR, no murmurs, no rubs  Abdomen: Active bowel sounds, soft, non-distended, no tenderness to palpation  Extremities: Diffuse edema in all extremity  Skin: Warm and dry  Neuro: Unresponsive  No new micro data.   CT chest with right pleural effusion and diffuse airspace disease/remodeling.   BMP with hypokalemia. CBC with improving leukocytosis.  L PIV 4/18  Urinary catheter 4/24 Trach 64mm 4/18  R PICC 4/26  Resolved Hospital Problem list     Assessment & Plan:   Acute hypoxemic respiratory failure requiring mechanical ventilation secondary to Covid pneumonia.   Superimposed MSSA, Klebsiella, and haemophilus pneumonia.   - Continue full vent support, 4-8 cc/kg ideal body weight with goal plateau less than 30 and driving pressure less than 15.  Titrate PEEP and FiO2 per ARDS ladder.  Tolerating vent dyssynchrony. - Continue Cefazolin with Klebsiella and staph on respiratory cultures from 4/23. Will discuss with pharmacy prolonged Cefazolin duration and adding anaerobic coverage given probable empyema  - Continue chest PT every 4 hours - Continue oxycodone 10 mg every 6 hours and Clonazepam 1 mg TID.  - Wean fentanyl as tolerated - Off Covid Precautions as of 4/21 - VAP prevention protocol - Continue to diurese with IV furosemide 80 mg BID and  acetazolamide 500 mg BID  Acute encephalopathy, multifactorial metabolic/hypoxic resp failure:  - Challenging to control with desats and tachypnea when fentanyl or propofol weaned.  - Remains on Fentanyl 200 mcg/hr. Wean as tolerated.  - Continue oxycodone 10 mg every 6 hours and Clonazepam 1 mg TID. Could consider increasing Clonazepam to facilitate weaning of fentanyl  - Seroquel stopped due to prolonged QTc . COVID-19 pneumonia - Previously completed remdesivir and dexamethasone - Off precautions as of 4/21  Hypernatremia:  - Cont free water flushes to 250 cc every 4 hours - Continue to monitor, BMET daily  Prediabetes, A1c 6.4 - Continue Lantus 20 units daily and SSI - Goal BG 140-180 while admitted to the ICU  Macrocytic anemia, normal B12 - Folate - Transfuse for hemoglobin less than 7, no current indication. - Continue to monitor  Prolonged QTc at admission - EKG 4/26 with qtc in 520's - Seroquel stopped due to prolonged QTc  Hypokalemia  - Replete  Muscular deconditioning- PT/OT consults, pt remains too somnolent at this time but as improves, either chair position in bed or move to chair  Best practice:  Diet: Tube feeds Pain/Anxiety/Delirium protocol (if indicated): wean Fluid status goals: even to negative as renal function allows VAP protocol (if indicated): In place DVT prophylaxis: Lovenox Central lines: none GU: condom catheter. GI prophylaxis: Protonix Glucose control: SSI Mobility:  Bedrest Code Status: Full code Family Communication: Wife  Disposition: ICU (working on LTACH placement)  Please see attending attestation for final recommendations.   Ina Homes, MD IMTS PGY3  Pager: [phone] 06/01/2019 6:52 AM

## 2019-06-01 NOTE — Progress Notes (Signed)
Assisted tele visit to patient with wife.  Matthew Eaton, Philis Nettle, RN

## 2019-06-01 NOTE — Progress Notes (Signed)
[facility] ADULT ICU REPLACEMENT PROTOCOL FOR AM LAB REPLACEMENT ONLY  The patient does apply for the [facility] Adult ICU Electrolyte Replacment Protocol based on the criteria listed below:   1. Is GFR >/= 40 ml/min? Yes.    Patient's GFR today is >60 2. Is urine output >/= 0.5 ml/kg/hr for the last 6 hours? Yes.   Patient's UOP is .9 ml/kg/hr 3. Is BUN < 60 mg/dL? Yes.    Patient's BUN today is 39 4. Abnormal electrolyte(s): K-3.4 5. Ordered repletion with: per protocol 6. If a panic level lab has been reported, has the CCM MD in charge been notified? Yes.  .   Physician:  Dr Terrill Mohr, Philis Nettle 06/01/2019 6:10 AM

## 2019-06-01 NOTE — Consult Note (Signed)
Chief Complaint: Patient was seen in consultation today for image guided left pleural fluid aspiration/possible chest tube placement.  Referring Physician(s): Audria Nine  Supervising Physician: Jacqulynn Cadet  Patient Status: [name] - In-pt  History of Present Illness: Matthew Eaton is a 59 y.o. male with a past medical history significant MDD, tobacco use, GERD, hiatal hernia, chronic pain and HLD who presented to [facility] ED on 04/08/2019 with complaints of dyspnea and hypoxia after being seen at his PCP's office for the same concerns. He was found to be COVID positive and was admitted for further evaluation and management, eventually leaving AMA on 4/5. He then presented to the ED later that evening via EMS due to severe hypoxia (50s on RA) eventually requiring intubation and mechanical ventilation on 4/8. He has had a prolonged hospital course including tracheostomy placement, central line placement, multiple courses of antibiotics, intermittent fevers and NG placement. He underwent a CT w/o contrast on 4/28 which noted a new complicated right effusion consistent with empyema. IR has been asked to perform an aspiration and possible chest tube placement within this collection to further direct care.  Patient sedated, opens eyes spontaneously a few times during exam, does not follow commands. Patient's wife Matthew Eaton is at bedside today, she is aware of the request for drainage of this collection and has discussed with the rest of their family and all agree that they would like to proceed.   Past Medical History:  Diagnosis Date  . Chronic neck pain 05/20/2012  . Family hx of ALS (amyotrophic lateral sclerosis) 05/27/2013  . GERD (gastroesophageal reflux disease)   . Hiatal hernia   . HLD (hyperlipidemia)   . Major depressive disorder, recurrent episode, moderate ([place]) 05/27/2013  . Neck pain    cervical spine with herniation  . Tobacco abuse   . Wrist fracture, left     Past  Surgical History:  Procedure Laterality Date  . NECK SURGERY     x2- 2010, 2011    Allergies: Gabapentin and Methocarbamol  Medications: Prior to Admission medications   Medication Sig Start Date End Date Taking? Authorizing Provider  aspirin 81 MG tablet Take 81 mg by mouth daily.      [provider]  atorvastatin (LIPITOR) 80 MG tablet TAKE 1 TABLET BY MOUTH DAILY AT 6 PM. Patient taking differently: Take 80 mg by mouth daily with supper.  09/12/18   Copland, Frederico Hamman, MD  buPROPion (WELLBUTRIN XL) 300 MG 24 hr tablet TAKE 1 TABLET BY MOUTH DAILY. Patient taking differently: Take 300 mg by mouth daily.  01/24/19   Copland, Frederico Hamman, MD  Cholecalciferol (VITAMIN D3) 50 MCG (2000 UT) TABS Take 2,000 Units by mouth daily with breakfast.    [provider]  citalopram (CELEXA) 40 MG tablet TAKE 1 TABLET BY MOUTH DAILY. Patient taking differently: Take 40 mg by mouth at bedtime.  01/09/19   Copland, Frederico Hamman, MD  clonazePAM (KLONOPIN) 0.5 MG tablet TAKE 1 TABLET BY MOUTH TWICE A DAY AS NEEDED Patient taking differently: Take 0.5 mg by mouth in the morning and at bedtime.  11/28/18   Copland, Frederico Hamman, MD  HYDROcodone-acetaminophen (NORCO) 10-325 MG tablet TAKE 1 TABLET BY MOUTH EVERY 6 HOURS AS NEEDED FOR SEVERE PAIN. Patient not taking: Reported on 04/26/2019 04/03/19   Copland, Frederico Hamman, MD  HYDROcodone-acetaminophen (Alligator) 10-325 MG tablet TAKE 1 TABLET BY MOUTH EVERY 6 HOURS AS NEEDED FOR SEVERE PAIN Patient taking differently: Take 0.5-1 tablets by mouth every 6 (six) hours as needed  for severe pain.  04/03/19   Copland, Frederico Hamman, MD  HYDROcodone-acetaminophen (NORCO) 10-325 MG tablet Take 1 tablet by mouth every 6 (six) hours as needed for severe pain. Patient not taking: Reported on 04/22/2019 04/03/19   Owens Loffler, MD  ibuprofen (ADVIL) 200 MG tablet Take 400 mg by mouth every 6 (six) hours as needed (for headaches).    [provider]  Melatonin 10 MG TABS Take  10-20 mg by mouth at bedtime as needed (for sleep).    [provider]  nortriptyline (PAMELOR) 25 MG capsule Take 1 capsule (25 mg total) by mouth at bedtime. Patient not taking: Reported on 05/02/2019 05/04/18   Copland, Frederico Hamman, MD  omeprazole (PRILOSEC) 20 MG capsule TAKE 1 CAPSULE BY MOUTH 2 TIMES DAILY BEFORE A MEAL. Patient taking differently: Take 20 mg by mouth 2 (two) times daily before a meal.  09/16/18   Copland, Frederico Hamman, MD     Family History  Problem Relation Age of Onset  . ALS Mother   . ALS Brother   . ALS Maternal Uncle   . ALS Maternal Aunt   . Colon cancer Neg Hx   . Esophageal cancer Neg Hx   . Rectal cancer Neg Hx   . Stomach cancer Neg Hx     Social History   Socioeconomic History  . Marital status: Married    Spouse name: Not on file  . Number of children: 5  . Years of education: 72  . Highest education level: Not on file  Occupational History  . Not on file  Tobacco Use  . Smoking status: Former Smoker    Years: 30.00    Quit date: 02/03/2007    Years since quitting: 12.3  . Smokeless tobacco: Never Used  Substance and Sexual Activity  . Alcohol use: No  . Drug use: No  . Sexual activity: Yes    Partners: Female  Other Topics Concern  . Not on file  Social History Narrative   Lives home with wife, Matthew Eaton.  Education 12th grade.  Five children.   Social Determinants of Health   Financial Resource Strain:   . Difficulty of Paying Living Expenses:   Food Insecurity:   . Worried About Charity fundraiser in the Last Year:   . Arboriculturist in the Last Year:   Transportation Needs:   . Film/video editor (Medical):   Marland Kitchen Lack of Transportation (Non-Medical):   Physical Activity:   . Days of Exercise per Week:   . Minutes of Exercise per Session:   Stress:   . Feeling of Stress :   Social Connections:   . Frequency of Communication with Friends and Family:   . Frequency of Social Gatherings with Friends and Family:   .  Attends Religious Services:   . Active Member of Clubs or Organizations:   . Attends Archivist Meetings:   Marland Kitchen Marital Status:      Review of Systems: A 12 point ROS discussed and pertinent positives are indicated in the HPI above.  All other systems are negative.  Review of Systems  Unable to perform ROS: Patient unresponsive    Vital Signs: BP (!) 102/50   Pulse (!) 106   Temp 100.2 F (37.9 C)   Resp (!) 23   Ht 5\' 10"  (1.778 m)   Wt 221 lb 12.5 oz (100.6 kg)   SpO2 94%   BMI 31.82 kg/m   Physical Exam Vitals and nursing note  reviewed.  Constitutional:      General: He is not in acute distress.    Appearance: He is ill-appearing and diaphoretic.  HENT:     Head: Normocephalic.     Mouth/Throat:     Comments: (+) tracheostomy Cardiovascular:     Rate and Rhythm: Regular rhythm. Tachycardia present.  Pulmonary:     Breath sounds: Rales (bilaterally) present.     Comments: Ventilated.  Abdominal:     General: There is distension.  Skin:    General: Skin is warm.      MD Evaluation Airway: Other (comments) Airway comments: Tracheostomy Heart: WNL Abdomen: WNL Chest/ Lungs: Other (comments)(Vent) ASA  Classification: 3 Mallampati/Airway Score: (Tracheostomy)   Imaging: DG Chest 1 View  Result Date: 05/11/2019 CLINICAL DATA:  Central line placement. COVID positive on 04/08/2019. EXAM: CHEST  1 VIEW COMPARISON:  05/11/2019 at 4:27 a.m. FINDINGS: New right internal jugular central venous line has its tip in the lower superior vena cava. No pneumothorax. Endotracheal tube tip projects 4.3 cm above the carina, higher than on the earlier exam. Nasal/orogastric tube tip passes a few cm through the GE junction. No change in extensive bilateral, left greater than right airspace lung opacities consistent with multifocal pneumonia. IMPRESSION: 1. Well-positioned right internal jugular central venous line. No pneumothorax. 2. Endotracheal tube tip now projects 4  point 3 cm above the carina. 3. No other change from the earlier study. Electronically Signed   By: Lajean Manes M.D.   On: 05/11/2019 09:56   DG Chest 2 View  Result Date: 04/05/2019 CLINICAL DATA:  Shortness of breath EXAM: CHEST - 2 VIEW COMPARISON:  March 03, 2009 FINDINGS: There is ill-defined airspace consolidation in the left mid lung and left lower lung regions. There is also subtle opacity in the right perihilar region. There is mild scarring in the right base. Heart size and pulmonary vascularity are normal. No adenopathy. Postoperative change noted in lower cervical region. IMPRESSION: Ill-defined airspace opacity left mid lung and left lower lung regions as well as more subtly in the right perihilar region. Appearance consistent with multifocal pneumonia. Mild atelectasis right base. Heart size normal.  No evident adenopathy. Electronically Signed   By: Lowella Grip III M.D.   On: 04/15/2019 13:25   DG Abd 1 View  Result Date: 05/27/2019 CLINICAL DATA:  59 year old male with feeding tube placement. EXAM: ABDOMEN - 1 VIEW COMPARISON:  Abdominal radiograph dated 05/12/2019. FINDINGS: Partially visualized feeding tube with weighted tip in the right hemiabdomen adjacent to the L3, likely in the region of the second and third portion of the duodenum. There is no bowel dilatation. The osseous structures and soft tissues are unremarkable. Diffuse bilateral pulmonary densities and right pleural effusion noted. IMPRESSION: Feeding tube with tip likely in the duodenum. Electronically Signed   By: Anner Crete M.D.   On: 05/27/2019 16:46   DG Abd 1 View  Result Date: 05/12/2019 CLINICAL DATA:  Orogastric tube placement. EXAM: ABDOMEN - 1 VIEW COMPARISON:  Same day. FINDINGS: The bowel gas pattern is normal. Distal tip of enteric tube is seen looped within the distal esophagus. No radio-opaque calculi or other significant radiographic abnormality are seen. IMPRESSION: Distal tip of enteric  tube is seen looped within the distal esophagus. No evidence of bowel obstruction or ileus. Electronically Signed   By: Marijo Conception M.D.   On: 05/12/2019 10:47   DG Abd 1 View  Result Date: 05/12/2019 CLINICAL DATA:  Orogastric tube placement. EXAM:  ABDOMEN - 1 VIEW COMPARISON:  One-view abdomen 05/12/2019 at 3:19 a.m. FINDINGS: Bowel gas pattern is normal. Side port of the NG tube is just above the GE junction, unchanged. Bilateral airspace disease is present, left greater than right. IMPRESSION: 1. Side port of the NG tube is just above the GE junction. 2. Stable bilateral airspace disease, left greater than right. Electronically Signed   By: San Morelle M.D.   On: 05/12/2019 08:00   DG Abd 1 View  Result Date: 05/12/2019 CLINICAL DATA:  Orogastric tube placement. EXAM: ABDOMEN - 1 VIEW COMPARISON:  May 11, 2019 FINDINGS: The visualized portion of the lungs is remarkable for moderate to marked severity bilateral infiltrates, left greater than right. A nasogastric tube is seen with its distal tip approximately 5.0 cm distal to the gastroesophageal junction. The bowel gas pattern is normal. No radio-opaque calculi or other significant radiographic abnormality are seen. IMPRESSION: 1. Nasogastric tube positioning, as described above. Further advancement of the NG tube by approximately 7 cm is recommended to decrease the risk of aspiration. 2. Moderate to marked severity bilateral infiltrates, left greater than right. Electronically Signed   By: Virgina Norfolk M.D.   On: 05/12/2019 03:56   CT CHEST WO CONTRAST  Result Date: 05/31/2019 CLINICAL DATA:  Hypoxemia EXAM: CT CHEST WITHOUT CONTRAST TECHNIQUE: Multidetector CT imaging of the chest was performed following the standard protocol without IV contrast. COMPARISON:  Chest x-ray from the previous day, CT from 05/09/2019 FINDINGS: Cardiovascular: Limited due to lack of IV contrast. Aortic calcifications are noted without aneurysmal  dilatation. No cardiac enlargement is seen. Pulmonary artery is of normal caliber. Mediastinum/Nodes: Thoracic inlet is within normal limits. Tracheostomy tube and gastric catheter are noted in satisfactory position. Scattered small mediastinal lymph nodes are noted. Evaluation for hilar adenopathy is somewhat limited due to the lack of IV contrast. The esophagus appears within normal limits. Lungs/Pleura: Chronic emphysematous changes are again identified with evidence of diffuse resolving infiltrates and underlying mild fibrosis. Some reticulonodular appearance is noted as well consistent with the progressive COVID-19 infection. New consolidation in the posterior aspect of the right upper lobe and right lower lobes is noted. This is also seen in the left lower lobe to a lesser degree. This is new from the prior exam. Increase in pleural fluid on the right is noted with air within likely representing an empyema. Percutaneous drainage may be helpful. Upper Abdomen: Visualized upper abdomen is within normal limits. Musculoskeletal: No acute bony abnormality is noted. IMPRESSION: Progressive changes related to the known COVID-19 infection although some new superimposed infiltrates are noted primarily within the lower lobes and right upper lobe as described. New complicated effusion on the right consistent with empyema. Percutaneous drainage may be helpful. Aortic Atherosclerosis (ICD10-I70.0) and Emphysema (ICD10-J43.9). Electronically Signed   By: Inez Catalina M.D.   On: 05/31/2019 17:43   CT ANGIO CHEST PE W OR WO CONTRAST  Result Date: 05/09/2019 CLINICAL DATA:  Shortness of breath.  COVID-19 positive EXAM: CT ANGIOGRAPHY CHEST WITH CONTRAST TECHNIQUE: Multidetector CT imaging of the chest was performed using the standard protocol during bolus administration of intravenous contrast. Multiplanar CT image reconstructions and MIPs were obtained to evaluate the vascular anatomy. CONTRAST:  70 mL OMNIPAQUE IOHEXOL  350 MG/ML SOLN COMPARISON:  May 05, 2019 CT angiogram chest; chest radiograph May 08, 2019 FINDINGS: Cardiovascular: There is no demonstrable pulmonary embolus. There is no thoracic aortic aneurysm or dissection. There are scattered foci in visualized great vessels. There are  scattered foci of aortic atherosclerosis. There is no pericardial effusion or pericardial thickening. Mediastinum/Nodes: Thyroid appears unremarkable. There is no evident thoracic adenopathy. There is a focal moderate hiatal hernia, stable. Lungs/Pleura: There is underlying centrilobular emphysematous change. There is widespread ground-glass type opacity throughout the lungs fairly diffusely with overall increased airspace opacity in the right middle lobe compared to recent CT with similar changes elsewhere. Note that there is extensive consolidation throughout most of the left lung, stable. No frank consolidation evident. No pleural effusions are appreciable. Upper Abdomen: Visualized upper abdominal structures appear unremarkable. Musculoskeletal: Postoperative changes noted in the lower cervical spine. There are no blastic or lytic bone lesions. There are no evident chest wall lesions. Review of the MIP images confirms the above findings. IMPRESSION: 1. No demonstrable pulmonary embolus. No thoracic aortic aneurysm. There are foci of aortic atherosclerosis. 2. Widespread ground-glass type opacity consistent with extensive multifocal pneumonia, slightly increased in the right middle lobe and stable albeit extensive elsewhere. Overall there is more infiltrate on the left than on the right, stable. Underlying degree of emphysema. 3.  No evident adenopathy. 4.  Stable moderate hiatal hernia. 5.  No evident adenopathy. Aortic Atherosclerosis (ICD10-I70.0) and Emphysema (ICD10-J43.9). Electronically Signed   By: Lowella Grip III M.D.   On: 05/09/2019 14:13   CT ANGIO CHEST PE W OR WO CONTRAST  Result Date: 05/05/2019 CLINICAL DATA:   Shortness of breath. COVID positive. EXAM: CT ANGIOGRAPHY CHEST WITH CONTRAST TECHNIQUE: Multidetector CT imaging of the chest was performed using the standard protocol during bolus administration of intravenous contrast. Multiplanar CT image reconstructions and MIPs were obtained to evaluate the vascular anatomy. CONTRAST:  197mL OMNIPAQUE IOHEXOL 350 MG/ML SOLN COMPARISON:  Chest x-ray from same day. FINDINGS: Cardiovascular: Satisfactory opacification of the pulmonary arteries to the segmental level. No evidence of pulmonary embolism. Normal heart size. No pericardial effusion. No thoracic aortic aneurysm or dissection. Mediastinum/Nodes: No enlarged mediastinal, hilar, or axillary lymph nodes. Thyroid gland, trachea, and esophagus demonstrate no significant findings. Lungs/Pleura: Patchy bilateral somewhat confluent ground-glass densities with areas of septal thickening in the left greater than right upper and lower lobes. Areas of subpleural banding and architectural distortion in the right lung. No pleural effusion or pneumothorax. Upper Abdomen: No acute abnormality. Small to moderate hiatal hernia. Musculoskeletal: No chest wall abnormality. No acute or significant osseous findings. Review of the MIP images confirms the above findings. IMPRESSION: 1. No evidence of pulmonary embolism. 2. Multifocal pneumonia, consistent with history of COVID-19 infection. 3. Hiatal hernia. Electronically Signed   By: Titus Dubin M.D.   On: 05/05/2019 12:36   DG CHEST PORT 1 VIEW  Result Date: 05/30/2019 CLINICAL DATA:  Acute hypoxic respiratory failure.  COVID. EXAM: PORTABLE CHEST 1 VIEW COMPARISON:  Three days ago FINDINGS: Tracheostomy tube in place. Feeding tube at least reaches the stomach. Bilateral reticular consolidative opacities with loculated fluid or extrapleural thickening on the right. Normal heart size and stable mediastinal contours. IMPRESSION: Bilateral pneumonia that is stable from 3 days ago and  progressed from more remote imaging. There is extrapleural thickening or loculated fluid along the lateral right chest which is not commonly seen with COVID pneumonia, possible superinfection Electronically Signed   By: Monte Fantasia M.D.   On: 05/30/2019 11:44   DG CHEST PORT 1 VIEW  Result Date: 05/27/2019 CLINICAL DATA:  Acute respiratory failure EXAM: PORTABLE CHEST 1 VIEW COMPARISON:  May 26, 2019 FINDINGS: The feeding tube terminates below today's film. The tracheostomy tube is  again identified. No pneumothorax. Diffuse bilateral pulmonary opacities are stable. The cardiomediastinal silhouette is stable. IMPRESSION: Stable diffuse bilateral pulmonary infiltrates. The feeding tube terminates below today's study. Electronically Signed   By: Dorise Bullion III M.D   On: 05/27/2019 11:44   DG CHEST PORT 1 VIEW  Result Date: 05/26/2019 CLINICAL DATA:  Fever.  Pneumonia. EXAM: PORTABLE CHEST 1 VIEW COMPARISON:  05/24/2019. FINDINGS: Feeding tube noted with tip below left hemidiaphragm. Heart size normal. Diffuse bilateral pulmonary infiltrates/edema. Right base atelectasis. Similar findings noted on prior exam. Small right pleural effusion noted on today's exam. No pneumothorax. Cervical spine fusion. IMPRESSION: Diffuse bilateral pulmonary infiltrates/edema. Similar findings noted on prior exam. Small right pleural effusion noted on today's exam. Electronically Signed   By: Marcello Moores  Register   On: 05/26/2019 11:47   DG CHEST PORT 1 VIEW  Result Date: 05/24/2019 CLINICAL DATA:  COVID-19 pneumonia. EXAM: PORTABLE CHEST 1 VIEW COMPARISON:  One-view chest x-ray 05/23/2019 FINDINGS: Tracheostomy tube is stable. Feeding tube courses off the inferior border the film. Interstitial and airspace disease is increased on the left. Findings are similar on the right. Lung volumes are low. IMPRESSION: Persistent interstitial and airspace disease compatible with COVID pneumonia. Airspace disease is increasing on  the left. Electronically Signed   By: San Morelle M.D.   On: 05/24/2019 06:50   DG CHEST PORT 1 VIEW  Result Date: 05/23/2019 CLINICAL DATA:  Follow-up pneumonia. COVID-19 positive. EXAM: PORTABLE CHEST 1 VIEW COMPARISON:  05/22/2019 FINDINGS: Tracheostomy tube in satisfactory position. Feeding tube extending into the proximal duodenum, tip not included. Mild increase in patchy opacity in the right mid lung zone. Mildly improved patchy opacities elsewhere in both lungs. Stable diffuse prominence of the interstitial markings. No pleural fluid. Mild thoracic spine degenerative changes. Cervical spine fixation hardware. IMPRESSION: 1. Improved bilateral pneumonia with exception of mildly increased pneumonia in the right mid lung zone. 2. Stable interstitial lung disease. Electronically Signed   By: Claudie Revering M.D.   On: 05/23/2019 13:22   DG Chest Port 1 View  Result Date: 05/22/2019 CLINICAL DATA:  COVID-19. EXAM: PORTABLE CHEST 1 VIEW COMPARISON:  May 20, 2019. FINDINGS: The heart size and mediastinal contours are within normal limits. Tracheostomy and feeding tubes are unchanged in position. No pneumothorax or pleural effusion is noted. Stable bilateral lung opacities are noted concerning for multifocal pneumonia. The visualized skeletal structures are unremarkable. IMPRESSION: Stable support apparatus. Stable bilateral lung opacities are noted concerning for multifocal pneumonia. Electronically Signed   By: Marijo Conception M.D.   On: 05/22/2019 08:08   DG Chest Port 1 View  Result Date: 05/20/2019 CLINICAL DATA:  59 year old male with tracheostomy EXAM: PORTABLE CHEST 1 VIEW COMPARISON:  05/18/2019 FINDINGS: Cardiomediastinal silhouette unchanged in size and contour. Right rotation of the patient somewhat limits evaluation, however, there are persistent reticulonodular opacities of the bilateral, left greater than right lungs. Interval placement of tracheostomy and removal of the  endotracheal tube. Unchanged enteric feeding tube traversing the mediastinum. No pneumothorax or large pleural effusion. Surgical changes of the cervical region IMPRESSION: Interval placement of tracheostomy with removal of the endotracheal tube. Similar appearance of bilateral reticulonodular opacities, worst on the left. Unchanged enteric tube. Electronically Signed   By: Corrie Mckusick D.O.   On: 05/20/2019 10:59   DG CHEST PORT 1 VIEW  Result Date: 05/18/2019 CLINICAL DATA:  COVID-19 EXAM: PORTABLE CHEST 1 VIEW COMPARISON:  Portable exam 0804 hours compared to 05/17/2019 FINDINGS: Tip of endotracheal tube  projects 3.1 cm above carina. Feeding tube extends into stomach. Normal heart size and mediastinal contours. BILATERAL pulmonary infiltrates asymmetrically greater on LEFT consistent with multifocal pneumonia. No pleural effusion or pneumothorax. Bones demineralized with evidence of prior cervical spine fusion. IMPRESSION: Persistent BILATERAL pulmonary infiltrates consistent with multifocal pneumonia. When compared to the previous exam, slight improvement in aeration in RIGHT lung. Electronically Signed   By: Lavonia Dana M.D.   On: 05/18/2019 08:10   DG CHEST PORT 1 VIEW  Result Date: 05/17/2019 CLINICAL DATA:  59 year old male on assisted mechanical ventilation. EXAM: PORTABLE CHEST 1 VIEW COMPARISON:  Chest x-ray 05/15/2019. FINDINGS: An endotracheal tube is in place with tip 3.9 cm above the carina. There is a right-sided internal jugular central venous catheter with tip terminating in the superior cavoatrial junction. A feeding tube is seen extending into the abdomen, however, the tip of the feeding tube extends below the lower margin of the image. Lung volumes are slightly low. Widespread patchy interstitial and ill-defined airspace disease noted throughout the lungs bilaterally, with slightly improved aeration compared to the prior examination. No pleural effusions. No evidence of pulmonary edema.  Heart size is normal. Upper mediastinal contours are within normal limits. IMPRESSION: 1. Support apparatus, as above. 2. Slight improving aeration with persistent evidence of severe multilobar bilateral pneumonia, as above. Electronically Signed   By: Vinnie Langton M.D.   On: 05/17/2019 07:04   DG Chest Port 1 View  Result Date: 05/15/2019 CLINICAL DATA:  59 year old male with history of respiratory failure. EXAM: PORTABLE CHEST 1 VIEW COMPARISON:  Chest x-ray 05/14/2019. FINDINGS: An endotracheal tube is in place with tip 3.8 cm above the carina. There is a right-sided internal jugular central venous catheter with tip terminating in the superior cavoatrial junction. Patchy multifocal airspace consolidation throughout the lungs bilaterally, left greater than right, with slightly improved aeration compared to the prior examination. No pleural effusions. No evidence of pulmonary edema. Heart size is normal. IMPRESSION: 1. Support apparatus, as above. 2. Severe multilobar bilateral pneumonia redemonstrated with slightly improved aeration compared to the prior examination, as above. Electronically Signed   By: Vinnie Langton M.D.   On: 05/15/2019 07:24   DG Chest Port 1 View  Result Date: 05/14/2019 CLINICAL DATA:  Evaluate ETT. EXAM: PORTABLE CHEST 1 VIEW COMPARISON:  April levin 2021 FINDINGS: The right central line is in good position. The ETT is in good position. The feeding tube terminates below today's film. Persistent peripheral right pulmonary infiltrate. Overall, the infiltrate on the right may be mildly improved. Diffuse pulmonary infiltrate on the left is more prominent the interval. No pneumothorax. No other interval changes. IMPRESSION: 1. Support apparatus as above. Specifically, the ETT is in good position. 2. Worsening diffuse infiltrate on the left. Mild improvement of right-sided infiltrate. Electronically Signed   By: Dorise Bullion III M.D   On: 05/14/2019 14:26   DG Chest Port 1  View  Result Date: 05/14/2019 CLINICAL DATA:  Respiratory failure and COVID-19 positive patient. EXAM: PORTABLE CHEST 1 VIEW COMPARISON:  05/13/2019 FINDINGS: Enteric tube courses into the stomach and off the film as tip is not visualized. Endotracheal tube and right IJ central venous catheter is unchanged. Lungs are hypoinflated demonstrate bilateral patchy airspace process without significant change compatible patient's known COVID-19 pneumonia. No effusion. Cardiomediastinal silhouette and remainder of the exam is unchanged. IMPRESSION: Stable multifocal bilateral airspace process compatible with known COVID-19 pneumonia. Tubes and lines as described. Electronically Signed   By: Marin Olp  M.D.   On: 05/14/2019 08:06   DG Chest Port 1 View  Result Date: 05/13/2019 CLINICAL DATA:  Respiratory failure. EXAM: PORTABLE CHEST 1 VIEW COMPARISON:  05/12/2019 FINDINGS: Endotracheal tube with tip 5.9 cm above the carina. Enteric tube courses into the stomach and off the film as tip is not visualized. Right IJ central venous catheter unchanged with tip over the SVC. Lungs are adequately inflated demonstrate stable moderate bilateral patchy airspace process likely multifocal pneumonia. No effusion. Cardiomediastinal silhouette and remainder the exam is unchanged. IMPRESSION: Stable moderate bilateral multifocal airspace process likely multifocal pneumonia. Tubes and lines as described. Electronically Signed   By: Marin Olp M.D.   On: 05/13/2019 09:05   DG Chest Port 1 View  Result Date: 05/12/2019 CLINICAL DATA:  Respiratory failure. EXAM: PORTABLE CHEST 1 VIEW COMPARISON:  May 11, 2019 FINDINGS: There is stable endotracheal tube, nasogastric tube and right internal jugular venous catheter positioning. Persistent moderate to marked severity bilateral infiltrates are seen, left greater than right. There is no evidence of a pleural effusion or pneumothorax. The heart size and mediastinal contours are within  normal limits. The visualized skeletal structures are unremarkable. IMPRESSION: Persistent moderate to marked severity bilateral infiltrates, left greater than right. Electronically Signed   By: Virgina Norfolk M.D.   On: 05/12/2019 03:59   Portable Chest x-ray  Result Date: 05/11/2019 CLINICAL DATA:  Intubation EXAM: PORTABLE CHEST 1 VIEW COMPARISON:  Earlier today FINDINGS: New endotracheal tube with tip 18 mm above the carina. The orogastric tube tip and side-port reaches the stomach. Bilateral pulmonary infiltrate with worsening aeration on the left. Normal heart size and mediastinal contours. No visible effusion or pneumothorax. IMPRESSION: 1. New hardware in place. The endotracheal tube tip is 18 mm above the carina. 2. COVID associated pneumonia with rapid worsening on the left, question aspiration Electronically Signed   By: Monte Fantasia M.D.   On: 05/11/2019 04:47   DG CHEST PORT 1 VIEW  Result Date: 05/11/2019 CLINICAL DATA:  Acute respiratory failure with hypoxia EXAM: PORTABLE CHEST 1 VIEW COMPARISON:  Three days ago FINDINGS: Patchy bilateral pneumonia with mild progression. Normal heart size and stable mediastinal contours. No visible effusion or pneumothorax. IMPRESSION: Bilateral pneumonia with mild worsening from 3 days ago. Electronically Signed   By: Monte Fantasia M.D.   On: 05/11/2019 04:10   DG Chest Port 1 View  Result Date: 05/23/2019 CLINICAL DATA:  Shortness of breath. COVID pneumonia. EXAM: PORTABLE CHEST 1 VIEW COMPARISON:  Chest x-ray 05/05/2019 FINDINGS: The cardiac silhouette, mediastinal and hilar contours are stable. Persistent patchy ill-defined interstitial and airspace infiltrates in both lungs, left worse than right. No definite pleural effusions. IMPRESSION: Persistent bilateral infiltrates, left worse than right. Electronically Signed   By: Marijo Sanes M.D.   On: 05/11/2019 08:06   DG Chest Port 1 View  Result Date: 05/05/2019 CLINICAL DATA:  Shortness of  breath.  COVID positive. EXAM: PORTABLE CHEST 1 VIEW COMPARISON:  04/14/2019. FINDINGS: Mediastinum and hilar structures normal. Progressive infiltrate noted throughout the left lung. Mild infiltrate in the right upper lung. Bilateral subsegmental atelectasis. No pleural effusion or pneumothorax. Mild cardiomegaly. No pulmonary venous congestion. Sliding hiatal hernia. Thoracic spine scoliosis. No acute bony abnormality IMPRESSION: Progressive infiltrate noted throughout the left lung. Mild infiltrate right upper lung. Bilateral subsegmental atelectasis. Electronically Signed   By: Marcello Moores  Register   On: 05/05/2019 08:39   DG Abd Portable 1V  Result Date: 05/11/2019 CLINICAL DATA:  Esophageal intubation EXAM: PORTABLE  ABDOMEN - 1 VIEW COMPARISON:  None. FINDINGS: The orogastric tube tip and side-port overlaps the stomach. Nonobstructive bowel gas pattern with no concerning mass effect or gas collection IMPRESSION: Orogastric tube in good position. Electronically Signed   By: Monte Fantasia M.D.   On: 05/11/2019 04:48   VAS [place] LOWER EXTREMITY VENOUS (DVT)  Result Date: 05/10/2019  Lower Venous DVTStudy Indications: Swelling.  Comparison Study: no prior Performing Technologist: June Leap RDMS, RVT  Examination Guidelines: A complete evaluation includes B-mode imaging, spectral Doppler, color Doppler, and power Doppler as needed of all accessible portions of each vessel. Bilateral testing is considered an integral part of a complete examination. Limited examinations for reoccurring indications may be performed as noted. The reflux portion of the exam is performed with the patient in reverse Trendelenburg.  +---------+---------------+---------+-----------+----------+--------------+ RIGHT    CompressibilityPhasicitySpontaneityPropertiesThrombus Aging +---------+---------------+---------+-----------+----------+--------------+ CFV      Full           Yes      Yes                                  +---------+---------------+---------+-----------+----------+--------------+ SFJ      Full                                                        +---------+---------------+---------+-----------+----------+--------------+ FV Prox  Full                                                        +---------+---------------+---------+-----------+----------+--------------+ FV Mid   Full                                                        +---------+---------------+---------+-----------+----------+--------------+ FV DistalFull                                                        +---------+---------------+---------+-----------+----------+--------------+ PFV      Full                                                        +---------+---------------+---------+-----------+----------+--------------+ POP      Full           Yes      Yes                                 +---------+---------------+---------+-----------+----------+--------------+ PTV      Full                                                        +---------+---------------+---------+-----------+----------+--------------+  PERO     Full                                                        +---------+---------------+---------+-----------+----------+--------------+   +---------+---------------+---------+-----------+----------+--------------+ LEFT     CompressibilityPhasicitySpontaneityPropertiesThrombus Aging +---------+---------------+---------+-----------+----------+--------------+ CFV      Full           Yes      Yes                                 +---------+---------------+---------+-----------+----------+--------------+ SFJ      Full                                                        +---------+---------------+---------+-----------+----------+--------------+ FV Prox  Full                                                         +---------+---------------+---------+-----------+----------+--------------+ FV Mid   Full                                                        +---------+---------------+---------+-----------+----------+--------------+ FV DistalFull                                                        +---------+---------------+---------+-----------+----------+--------------+ PFV      Full                                                        +---------+---------------+---------+-----------+----------+--------------+ POP      Full           Yes      Yes                                 +---------+---------------+---------+-----------+----------+--------------+ PTV      Full                                                        +---------+---------------+---------+-----------+----------+--------------+ PERO     Full                                                        +---------+---------------+---------+-----------+----------+--------------+  Summary: RIGHT: - There is no evidence of deep vein thrombosis in the lower extremity.  - No cystic structure found in the popliteal fossa.  LEFT: - There is no evidence of deep vein thrombosis in the lower extremity.  - No cystic structure found in the popliteal fossa.  *See table(s) above for measurements and observations. Electronically signed by Servando Snare MD on 05/10/2019 at 5:10:16 PM.    Final    ECHOCARDIOGRAM LIMITED  Result Date: 05/10/2019    ECHOCARDIOGRAM REPORT   Patient Name:   Matthew Eaton Date of Exam: 05/10/2019 Medical Rec #:  [id number]           Height:       70.0 in Accession #:    [id number]          Weight:       250.0 lb Date of Birth:  December 24, 1960           BSA:          2.295 m Patient Age:    89 years            BP:           114/62 mmHg Patient Gender: M                   HR:           72 bpm. Exam Location:  Inpatient Procedure: 2D Echo, Cardiac Doppler and Color Doppler Indications:    CHF-Acute Diastolic  0000000  History:        Patient has no prior history of Echocardiogram examinations.                 Risk Factors:Dyslipidemia, Diabetes and Former Smoker. Acute                 renal failure. Acute hypoxemic respiratory failure due to                 COVID-19. GERD.  Sonographer:    Clayton Lefort RDCS (AE) Referring Phys: [id number]intosh [facility]  Sonographer Comments: Technically challenging study due to limited acoustic windows. COVID-19. IMPRESSIONS  1. Left ventricular ejection fraction, by estimation, is 60 to 65%. The left ventricle has normal function. The left ventricle has no regional wall motion abnormalities. There is mild left ventricular hypertrophy. Left ventricular diastolic parameters are consistent with Grade I diastolic dysfunction (impaired relaxation).  2. Right ventricular systolic function is low normal. The right ventricular size is normal.  3. The mitral valve is grossly normal. Trivial mitral valve regurgitation.  4. The aortic valve is tricuspid. Aortic valve regurgitation is not visualized. Mild aortic valve sclerosis is present, with no evidence of aortic valve stenosis. FINDINGS  Left Ventricle: Left ventricular ejection fraction, by estimation, is 60 to 65%. The left ventricle has normal function. The left ventricle has no regional wall motion abnormalities. The left ventricular internal cavity size was normal in size. There is  mild left ventricular hypertrophy. Left ventricular diastolic parameters are consistent with Grade I diastolic dysfunction (impaired relaxation). Normal left ventricular filling pressure. Right Ventricle: The right ventricular size is normal. No increase in right ventricular wall thickness. Right ventricular systolic function is low normal. Left Atrium: Left atrial size was normal in size. Right Atrium: Right atrial size was normal in size. Pericardium: There is no evidence of pericardial effusion. Mitral Valve: The mitral valve is grossly normal. Trivial  mitral valve regurgitation. Tricuspid Valve: The tricuspid valve is  grossly normal. Tricuspid valve regurgitation is trivial. Aortic Valve: The aortic valve is tricuspid. Aortic valve regurgitation is not visualized. Mild aortic valve sclerosis is present, with no evidence of aortic valve stenosis. Aortic valve mean gradient measures 4.0 mmHg. Pulmonic Valve: The pulmonic valve was normal in structure. Pulmonic valve regurgitation is not visualized. Aorta: The aortic root and ascending aorta are structurally normal, with no evidence of dilitation. Venous: The inferior vena cava was not well visualized. IAS/Shunts: No atrial level shunt detected by color flow Doppler.  LEFT VENTRICLE PLAX 2D LVIDd:         4.10 cm  Diastology LVIDs:         2.70 cm  LV e' lateral:   9.90 cm/s LV PW:         1.20 cm  LV E/e' lateral: 3.9 LV IVS:        1.00 cm  LV e' medial:    6.53 cm/s LVOT diam:     1.90 cm  LV E/e' medial:  6.0 LV SV:         39 LV SV Index:   17 LVOT Area:     2.84 cm  LEFT ATRIUM         Index LA diam:    2.20 cm 0.96 cm/m  AORTIC VALVE AV Area (Vmax):    1.77 cm AV Area (Vmean):   1.68 cm AV Area (VTI):     1.66 cm AV Vmax:           145.00 cm/s AV Vmean:          87.900 cm/s AV VTI:            0.234 m AV Peak Grad:      8.4 mmHg AV Mean Grad:      4.0 mmHg LVOT Vmax:         90.70 cm/s LVOT Vmean:        52.200 cm/s LVOT VTI:          0.137 m LVOT/AV VTI ratio: 0.59  AORTA Ao Root diam: 3.30 cm MITRAL VALVE               TRICUSPID VALVE MV Area (PHT): 2.56 cm    TR Peak grad:   11.7 mmHg MV Decel Time: 296 msec    TR Vmax:        171.00 cm/s MV E velocity: 39.10 cm/s MV A velocity: 62.60 cm/s  SHUNTS MV E/A ratio:  0.62        Systemic VTI:  0.14 m                            Systemic Diam: 1.90 cm Lyman Bishop MD Electronically signed by Lyman Bishop MD Signature Date/Time: 05/10/2019/10:50:51 AM    Final    [place] EKG SITE RITE  Result Date: 05/29/2019 If Site Rite image not attached, placement could  not be confirmed due to current cardiac rhythm.  US Abdomen Limited RUQ  Result Date: 04/08/2019 CLINICAL DATA:  Elevated LFTs EXAM: ULTRASOUND ABDOMEN LIMITED RIGHT UPPER QUADRANT COMPARISON:  None. FINDINGS: Gallbladder: No gallstones or wall thickening visualized. No sonographic Murphy sign noted by sonographer. Common bile duct: Not well visualized Liver: Diffusely increased in echogenicity consistent with fatty infiltration. Portal vein is patent on color Doppler imaging with normal direction of blood flow towards the liver. Other: None. IMPRESSION: Common bile duct is not visualized on this exam although  no intrahepatic ductal dilatation is seen. Normal-appearing gallbladder. Fatty liver. Electronically Signed   By: Inez Catalina M.D.   On: 04/08/2019 23:52    Labs:  CBC: Recent Labs    05/30/19 0329 05/30/19 0542 05/31/19 0319 06/01/19 0330  WBC 23.0* 22.8* 19.1* 15.2*  HGB 9.2* 9.3* 8.6* 7.9*  HCT 31.5* 31.1* 29.0* 27.1*  PLT 519* 478* 380 370    COAGS: Recent Labs    04/08/2019 1614  INR 0.9    BMP: Recent Labs    05/29/19 0454 05/30/19 0542 05/31/19 0319 06/01/19 0330  NA 139 142 142 144  K 3.7 3.0* 3.2* 3.4*  CL 99 98 99 100  CO2 29 35* 35* 33*  GLUCOSE 170* 173* 168* 149*  BUN 55* 39* 39* 39*  CALCIUM 8.8* 9.2 9.2 8.6*  CREATININE 0.75 0.63 0.64 0.70  GFRNONAA >60 >60 >60 >60  GFRAA >60 >60 >60 >60    LIVER FUNCTION TESTS: Recent Labs    05/09/19 0710 05/10/19 0442 05/12/19 0530 05/26/19 1003  BILITOT 0.8 0.8 0.7 0.4  AST 75* 84* 44* 109*  ALT 133* 141* 106* 78*  ALKPHOS 75 80 94 226*  PROT 5.4* 5.4* 5.0* 5.8*  ALBUMIN 2.9* 2.9* 2.4* 1.3*    TUMOR MARKERS: No results for input(s): AFPTM, CEA, CA199, CHROMGRNA in the last 8760 hours.  Assessment and Plan:  59 y/o M admitted with hypoxic respiratory failure 2/2 COVID pneumonia who has had a prolonged hospital course requiring tracheostomy, mechanical ventilation, NG placement and multiple  courses of abx for respiratory infections. A CT chest w/o contrast was obtained yesterday due to persistent hypoxia, leukocytosis and recent fevers and showed a new complicated right effusion consistent with empyema - IR has been asked to perform an aspiration/possible chest tube placement to further direct care. Patient reviewed by Dr. Laurence Ferrari today who approves patient for procedure.  Will tentatively plan for aspiration and possible chest tube placement 4/30 in CT. Tube feeds to be held at midnight, hold Lovenox until post procedure, AM labs pending, IR will call for patient when ready.  Risks and benefits discussed with the patient's wife Matthew Eaton including bleeding, infection, damage to adjacent structures, malfunction of the catheter with need for additional procedures.  All of the patient's questions were answered, patient is agreeable to proceed.  Consent signed and in chart.  Thank you for this interesting consult.  I greatly enjoyed meeting Matthew Eaton and look forward to participating in their care.  A copy of this report was sent to the requesting provider on this date.  Electronically Signed: Joaquim Nam, PA-C 06/01/2019, 1:59 PM   I spent a total of 40 Minutes  in face to face in clinical consultation, greater than 50% of which was counseling/coordinating care for right effusion aspiration/possible drain placement.

## 2019-06-01 NOTE — Progress Notes (Signed)
Nutrition Follow-up  DOCUMENTATION CODES:   Obesity unspecified  INTERVENTION:   Continue Tube Feeding via Cortrak: Vital 1.5 at 60 m/hr Pro-Stat 30 mL QID Provides 2560 kcals, 157 g of protein and 1094 mL of free water  Total free water with TF plus free water flushes: 2594 mL of free water   NUTRITION DIAGNOSIS:   Increased nutrient needs related to catabolic illness(Covid) as evidenced by estimated needs.  Ongoing  GOAL:   Patient will meet greater than or equal to 90% of their needs  Met with TF  MONITOR:   Vent status, TF tolerance, Labs, Weight trends, Skin   ASSESSMENT:   Pt initially admitted for acute hypoxic respiratory failure due to COVID-19 PNA 3/31-4/5 and signed out AMA. Pt returned later on 4/5 C/O worsening hypoxemia and was re-admitted. PMH includes GERD, hiatal hernia, HLD, depression, and chronic pain.  4/08 Intubated 4/09 Cortrak placed, tip now in the 2nd/3rd portion of the duodenum per abdominal film on 4/24.  4/17 Trach placed  Patient remains intubated on ventilator support. MV: 14.3 L/min Temp (24hrs), Avg:100 F (37.8 C), Min:98.6 F (37 C), Max:100.4 F (38 C)   Currently receiving Vital 1.5 at 60 ml/hr via Cortrak with Pro-Stat 30 mL QID, free water flushes 250 mL q 4 hours, tolerating without difficulty. Provides 2560 kcal, 157 gm protein, 2594 ml total free water daily.  Current weight 100.6 kg; weight of 93.8 kg on 4/9, lowest weight of 90.1 kg on 4/19. Net + 10.5 L since admission  Labs: potassium 3.4 (L) CBGs 134-145 Meds: colace, lasix, ss novolog, lantus, folic acid, miralax, KCl.   Diet Order:   Diet Order            Diet NPO time specified  Diet effective midnight              EDUCATION NEEDS:   Not appropriate for education at this time  Skin:  Skin Assessment: Reviewed RN Assessment(5x4 cm round red circle to R buttock) Skin Integrity Issues:: Stage I Stage I: N/A  Last BM:  4/28 type 7  Height:    Ht Readings from Last 1 Encounters:  05/29/2019 _0  (1.778 m)    Weight:   Wt Readings from Last 1 Encounters:  06/01/19 100.6 kg    Ideal Body Weight:  75.45 kg  Estimated Nutritional Needs:   Kcal:  4650-3546  Protein:  150-188 grams  Fluid:  >2L   Molli Barrows, RD, LDN, CNSC Please refer to Amion for contact information.

## 2019-06-02 ENCOUNTER — Inpatient Hospital Stay (HOSPITAL_COMMUNITY): Payer: PPO

## 2019-06-02 DIAGNOSIS — U071 COVID-19: Secondary | ICD-10-CM

## 2019-06-02 LAB — BASIC METABOLIC PANEL
Anion gap: 10 (ref 5–15)
Anion gap: 11 (ref 5–15)
BUN: 36 mg/dL — ABNORMAL HIGH (ref 6–20)
BUN: 38 mg/dL — ABNORMAL HIGH (ref 6–20)
CO2: 33 mmol/L — ABNORMAL HIGH (ref 22–32)
CO2: 32 mmol/L (ref 22–32)
Calcium: 8.8 mg/dL — ABNORMAL LOW (ref 8.9–10.3)
Calcium: 8.7 mg/dL — ABNORMAL LOW (ref 8.9–10.3)
Chloride: 100 mmol/L (ref 98–111)
Chloride: 99 mmol/L (ref 98–111)
Creatinine, Ser: 0.65 mg/dL (ref 0.61–1.24)
Creatinine, Ser: 0.71 mg/dL (ref 0.61–1.24)
GFR calc Af Amer: >60 mL/min (ref >60)
GFR calc Af Amer: >60 mL/min (ref >60)
GFR calc non Af Amer: >60 mL/min (ref >60)
GFR calc non Af Amer: >60 mL/min (ref >60)
Glucose, Bld: 103 mg/dL — ABNORMAL HIGH (ref 70–99)
Glucose, Bld: 152 mg/dL — ABNORMAL HIGH (ref 70–99)
Potassium: 2.9 mmol/L — ABNORMAL LOW (ref 3.5–5.1)
Potassium: 3.2 mmol/L — ABNORMAL LOW (ref 3.5–5.1)
Sodium: 143 mmol/L (ref 135–145)
Sodium: 142 mmol/L (ref 135–145)

## 2019-06-02 LAB — CBC
HCT: 27.8 % — ABNORMAL LOW (ref 39.0–52.0)
Hemoglobin: 8.3 g/dL — ABNORMAL LOW (ref 13.0–17.0)
MCH: 29.9 pg (ref 26.0–34.0)
MCHC: 29.9 g/dL — ABNORMAL LOW (ref 30.0–36.0)
MCV: 100 fL (ref 80.0–100.0)
Platelets: 454 10*3/uL — ABNORMAL HIGH (ref 150–400)
RBC: 2.78 MIL/uL — ABNORMAL LOW (ref 4.22–5.81)
RDW: 16.6 % — ABNORMAL HIGH (ref 11.5–15.5)
WBC: 16.2 10*3/uL — ABNORMAL HIGH (ref 4.0–10.5)
nRBC: 0.2 % (ref 0.0–0.2)

## 2019-06-02 LAB — GLUCOSE, CAPILLARY
Glucose-Capillary: 109 mg/dL — ABNORMAL HIGH (ref 70–99)
Glucose-Capillary: 116 mg/dL — ABNORMAL HIGH (ref 70–99)
Glucose-Capillary: 143 mg/dL — ABNORMAL HIGH (ref 70–99)
Glucose-Capillary: 147 mg/dL — ABNORMAL HIGH (ref 70–99)
Glucose-Capillary: 159 mg/dL — ABNORMAL HIGH (ref 70–99)
Glucose-Capillary: 159 mg/dL — ABNORMAL HIGH (ref 70–99)

## 2019-06-02 LAB — PROTIME-INR
INR: 1.1 (ref 0.8–1.2)
Prothrombin Time: 13.5 seconds (ref 11.4–15.2)

## 2019-06-02 MED ORDER — LIDOCAINE HCL 1 % IJ SOLN
INTRAMUSCULAR | Status: AC
  Filled 2019-06-02: qty 20

## 2019-06-02 MED ORDER — POTASSIUM CHLORIDE 10 MEQ/50ML IV SOLN
10.0000 meq | INTRAVENOUS | Status: AC
  Administered 2019-06-02 (×2): 10 meq via INTRAVENOUS

## 2019-06-02 MED ORDER — ENOXAPARIN SODIUM 40 MG/0.4ML ~~LOC~~ SOLN
40.0000 mg | Freq: Two times a day (BID) | SUBCUTANEOUS | Status: DC
  Administered 2019-06-02 – 2019-06-06 (×8): 40 mg via SUBCUTANEOUS
  Filled 2019-06-02 (×8): qty 0.4

## 2019-06-02 MED ORDER — FENTANYL CITRATE (PF) 100 MCG/2ML IJ SOLN
INTRAMUSCULAR | Status: AC | PRN
  Administered 2019-06-02: 50 ug via INTRAVENOUS

## 2019-06-02 MED ORDER — POTASSIUM CHLORIDE 10 MEQ/50ML IV SOLN
10.0000 meq | INTRAVENOUS | Status: DC
  Administered 2019-06-02 (×2): 10 meq via INTRAVENOUS
  Filled 2019-06-02 (×5): qty 50

## 2019-06-02 MED ORDER — SODIUM CHLORIDE 0.9 % IV SOLN
3.0000 g | Freq: Four times a day (QID) | INTRAVENOUS | Status: DC
  Administered 2019-06-02 – 2019-06-04 (×9): 3 g via INTRAVENOUS
  Filled 2019-06-02 (×9): qty 8

## 2019-06-02 MED ORDER — MIDAZOLAM HCL 2 MG/2ML IJ SOLN
INTRAMUSCULAR | Status: AC
  Filled 2019-06-02: qty 2

## 2019-06-02 MED ORDER — FENTANYL CITRATE (PF) 100 MCG/2ML IJ SOLN
INTRAMUSCULAR | Status: AC
  Filled 2019-06-02: qty 2

## 2019-06-02 MED ORDER — POTASSIUM CHLORIDE 20 MEQ/15ML (10%) PO SOLN
40.0000 meq | Freq: Once | ORAL | Status: AC
  Administered 2019-06-02: 40 meq
  Filled 2019-06-02: qty 30

## 2019-06-02 NOTE — Progress Notes (Signed)
[facility] ADULT ICU REPLACEMENT PROTOCOL FOR AM LAB REPLACEMENT ONLY  The patient does apply for the [facility] Adult ICU Electrolyte Replacment Protocol based on the criteria listed below:   1. Is GFR >/= 40 ml/min? Yes.    Patient's GFR today is >60 2. Is urine output >/= 0.5 ml/kg/hr for the last 6 hours? Yes.   Patient's UOP is 1 ml/kg/hr 3. Is BUN < 60 mg/dL? Yes.    Patient's BUN today is 36 4. Abnormal electrolyte(s): K-2.9 5. Ordered repletion with: per protocol 6. If a panic level lab has been reported, has the CCM MD in charge been notified? Yes.  .   Physician:  Dr. Terrill Mohr, Philis Nettle 06/02/2019 6:10 AM

## 2019-06-02 NOTE — Progress Notes (Signed)
Possible stage 2 pressure injury observed on buttocks during bed bath. Area cleansed, dried and sacral foam placed.  MD notified and wound care consult placed.

## 2019-06-02 NOTE — Sedation Documentation (Signed)
Pt not sedated only given fentanyl  

## 2019-06-02 NOTE — Procedures (Addendum)
Interventional Radiology Procedure Note  Procedure: 1) Right chest tube placement for PTX; 2) Right chest tube placement for empyema  Complications: None  Estimated Blood Loss: None  Findings: Initial CT shows new, 20% anterior right PTX. 12 Fr pigtail drain placed in anterior right pleural space to treat PTX and connected to [employer].  Aspiration of right pleural empyema yielded purulent fluid. 12 Fr pigtail drain placed in posterior right pleural empyema and connected to [employer].  Venetia Night. Kathlene Cote, M.D Pager:  [phone]

## 2019-06-02 NOTE — Progress Notes (Addendum)
NAME:  Matthew Eaton, MRN:  [id number], DOB:  Feb 25, 1960, LOS: 17 ADMISSION DATE:  05/19/2019, CONSULTATION DATE:  05/11/2019 REFERRING MD:  [facility], CHIEF COMPLAINT:  hypoxemia   Brief History    59 year old withCovid pneumonia, worsened despite therapy.   Transferred to unit, intubated 05/11/2019.    History of present illness    Patient is a 59 year old male initially admitted 3/31 to 05/27/2019 for Covid pneumonia.  He signed out AMA the evening of 05/05/2019.  Then returned the evening of 4/5 complaining of worsening shortness of breath.  Presenting saturations at that time were in the 80s and he came up into the mid 90s on 100% oxygen.  We were asked to see early this morning in regards to worsening hypoxemia. The patient was noncompliant with his oxygen.  On high flow nasal cannula at 100% the patient had an oxygen saturation of about 79%. With the addition of 100% facemask the patient had oxygen saturation in the mid 80s.  He was verbal throughout and did not look particularly dyspneic.  Patient was urgently transferred to the ICU and intubated.  ventilated on 100% with an oxygen saturation between 85 and 95%. He completed his Decadron, remdesivir, and Actemra.  Chest x-ray performed showed worsening infiltrate particularly throughout the right lung.  Past Medical History   Past Medical History:  Diagnosis Date  . Chronic neck pain 05/20/2012  . Family hx of ALS (amyotrophic lateral sclerosis) 05/27/2013  . GERD (gastroesophageal reflux disease)   . Hiatal hernia   . HLD (hyperlipidemia)   . Major depressive disorder, recurrent episode, moderate ([place]) 05/27/2013  . Neck pain    cervical spine with herniation  . Tobacco abuse   . Wrist fracture, left    Significant Hospital Events    Transferred to ICU intubation 05/11/2019 4/11-proning 4/12-tolerating supine ventilation 4/13-tolerating PEEP wean. 4/18- trach to facilitate vent wean 4/22: ok to come off covid  precautions 4/22: Transferred out of 31M  Consults:  [facility], PCCM  Procedures:  Intubation 05/11/2019 Central line placement 05/11/2019 4/18 trach  Significant Diagnostic Tests:  4/6 ctpa:  1. No demonstrable pulmonary embolus. No thoracic aortic aneurysm. There are foci of aortic atherosclerosis. 2. Widespread ground-glass type opacity consistent with extensive multifocal pneumonia, slightly increased in the right middle lobe and stable albeit extensive elsewhere. Overall there is more infiltrate on the left than on the right, stable. Underlying degree of emphysema. 3.  No evident adenopathy. 4.  Stable moderate hiatal hernia. 5.  No evident adenopathy.  4/7 echo: LVEF 123456, grade I diastolic dysfunc 4/7 LE venous duplex: negative bilaterally for DVT  4/28 CT Chest >> 1. Progressive changes related to the known COVID-19 infection although some new superimposed infiltrates are noted primarily within the lower lobes and right upper lobe as described. 2. New complicated effusion on the right consistent with empyema. Percutaneous drainage may be helpful. 3. Aortic Atherosclerosis (ICD10-I70.0) and Emphysema (ICD10-J43.9).  Micro Data:  4/8 Respiratory cultures-abundant MSSA, Haemophilus influenzae Covid PCR positive 3/31 4/18 resp: MSSA 4/23: Blood culutres >> NGTD 4/23: Respiratory cultures >> Klebsiella and Staph  4/23: Urine cultures >> NGTD  Antimicrobials:  Cefepime 4/7-4/10 Ampicillin 4/10-4/11 Augmentin 4/12-4/16 Remdesivir-completed vanc 4/19->4/20 Cefazolin 4/20 >> 4/23 Zosyn 4/23 >> 4/26 Cefazolin 4/26 > Current  Interim history/subjective:  Per nursing: Febrile overnight. o issues overnight. No needs anticipated.   Objective   Blood pressure 129/74, pulse 95, temperature 99.9 F (37.7 C), resp. rate (!) 24, height 5\' 10"  (1.778  m), weight 100.6 kg, SpO2 99 %.    Vent Mode: PRVC FiO2 (%):  [50 %] 50 % Set Rate:  [24 bmp] 24 bmp Vt Set:   [550 mL] 550 mL PEEP:  [8 cmH20] 8 cmH20 Plateau Pressure:  [22 cmH20-27 cmH20] 22 cmH20    Intake/Output Summary (Last 24 hours) at 06/02/2019 1008 Last data filed at 06/02/2019 0600 Gross per 24 hour  Intake 1065.19 ml  Output 4455 ml  Net -3389.81 ml   Filed Weights   05/30/19 0500 05/31/19 0409 06/01/19 0332  Weight: 102.5 kg 106.2 kg 100.6 kg   Examination:  General: Ill appearing male, sedated HENT: Trach in place, improving purulent discharge around the trach Pulm: Good air movement with course breath sounds throughout  CV: RRR, no murmurs, no rubs  Abdomen: Active bowel sounds, soft, non-distended, no tenderness to palpation  Extremities: Diffuse edema in all extremity  Skin: Warm and dry  Neuro: Unresponsive  No new micro data.   CT chest with right pleural effusion and diffuse airspace disease/remodeling.   BMP with hypokalemia. CBC with improving leukocytosis.  L PIV 4/18  Urinary catheter 4/24 Trach 94mm 4/18  R PICC 4/26  Resolved Hospital Problem list     Assessment & Plan:   Acute hypoxemic respiratory failure requiring mechanical ventilation secondary to Covid pneumonia.   Superimposed MSSA, Klebsiella, and haemophilus pneumonia.   R empyema  - Continue full vent support, 4-8 cc/kg ideal body weight with goal plateau less than 30 and driving pressure less than 15.  Titrate PEEP and FiO2 per ARDS ladder.  Tolerating vent dyssynchrony. -Completed 10 days of cefezolin -today starting Unasyn (discussed with pharm) for empyema.  - Continue chest PT every 4 hours - Continue oxycodone 10 mg every 6 hours and Clonazepam 1 mg TID.  - Wean fentanyl as tolerated - Off Covid Precautions as of 4/21 - VAP prevention protocol - Continue to diurese with IV furosemide 80 mg BID and acetazolamide 500 mg BID -chest tubes to suction.  Cont free water, will decrease diuretics today.  Review of CT looks like a lot of chronic changes, emphysema, reticular changes, some  ggo.  -3L yesterday  Acute encephalopathy, multifactorial metabolic/hypoxic resp failure:  - Challenging to control with desats and tachypnea when fentanyl or propofol weaned.  - Remains on Fentanyl 200 mcg/hr. Wean as tolerated.  - Continue oxycodone 10 mg every 6 hours and Clonazepam 1 mg TID. Could consider increasing Clonazepam to facilitate weaning of fentanyl  - Seroquel stopped due to prolonged QTc -D/c propofol due to elevated triglycerides,  . COVID-19 pneumonia - Previously completed remdesivir and dexamethasone - Off precautions as of 4/21  Hypernatremia:  - Cont free water flushes to 250 cc every 4 hours - Continue to monitor, BMET daily  Prediabetes, A1c 6.4 - Continue Lantus 20 units daily and SSI - Goal BG 140-180 while admitted to the ICU  Macrocytic anemia, normal B12 - Folate - Transfuse for hemoglobin less than 7, no current indication. - Continue to monitor  Prolonged QTc at admission - EKG 4/26 with qtc in 520's - Seroquel stopped due to prolonged QTc  Hypokalemia  - Replete  Muscular deconditioning- PT/OT consults, pt remains too somnolent at this time but as improves, either chair position in bed or move to chair  Best practice:  Diet: Tube feeds Pain/Anxiety/Delirium protocol (if indicated): wean Fluid status goals: even to negative as renal function allows VAP protocol (if indicated): In place DVT prophylaxis: Lovenox 40  BID Central lines: none GU: condom catheter. GI prophylaxis: Protonix Glucose control: SSI Mobility: Bedrest Code Status: Full code Family Communication: Wife  Disposition: ICU (working on LTACH placement)  Total critical care time 45 minutes Collier Bullock MD

## 2019-06-02 NOTE — TOC Progression Note (Addendum)
Transition of Care [facility]) - Progression Note    Patient Details  Name: Alijah Glisan MRN: [id number] Date of Birth: 08-02-1960  Transition of Care [facility]) CM/SW Contact  Curlene Labrum, RN Phone Number: 06/02/2019, 8:41 AM  Clinical Narrative:    Case Management spoke with Liam Rogers, RN [phone]) with [employer] regarding peer to peer scheduled for 06/02/2019 in regards to patient's denial for Ltach services.  Hardin Negus IDM letter was received and will be reviewed with the spouse today so that understanding is made for delay of transfer to [facility] once patient is stable for admission.  Patient is scheduled for CT and IR procedure today.  I spoke with Kathee Delton, RNCM at [facility], and she will continue to follow the patient for probable acceptance once the patient is medically stable for transfer.  Considering the patient's clinical condition this week - reconsideration will be made for admission possibly next week once medically stable and procedures complete.       Expected Discharge Plan and Services    Case Management spoke with Kathee Delton, RNCM liaison with [facility] will continue to follow patient for LTAC placement once patient is medically stable.                                             Social Determinants of Health (SDOH) Interventions   Readmission Risk Interventions No flowsheet data found.

## 2019-06-03 ENCOUNTER — Inpatient Hospital Stay (HOSPITAL_COMMUNITY): Payer: PPO

## 2019-06-03 LAB — GLUCOSE, CAPILLARY
Glucose-Capillary: 159 mg/dL — ABNORMAL HIGH (ref 70–99)
Glucose-Capillary: 132 mg/dL — ABNORMAL HIGH (ref 70–99)
Glucose-Capillary: 154 mg/dL — ABNORMAL HIGH (ref 70–99)
Glucose-Capillary: 133 mg/dL — ABNORMAL HIGH (ref 70–99)
Glucose-Capillary: 161 mg/dL — ABNORMAL HIGH (ref 70–99)

## 2019-06-03 LAB — CBC WITH DIFFERENTIAL/PLATELET
Abs Immature Granulocytes: 0.27 10*3/uL — ABNORMAL HIGH (ref 0.00–0.07)
Basophils Absolute: 0 10*3/uL (ref 0.0–0.1)
Basophils Relative: 0 %
Eosinophils Absolute: 0.1 10*3/uL (ref 0.0–0.5)
Eosinophils Relative: 0 %
HCT: 29.4 % — ABNORMAL LOW (ref 39.0–52.0)
Hemoglobin: 8.6 g/dL — ABNORMAL LOW (ref 13.0–17.0)
Immature Granulocytes: 2 %
Lymphocytes Relative: 8 %
Lymphs Abs: 1.3 10*3/uL (ref 0.7–4.0)
MCH: 29.3 pg (ref 26.0–34.0)
MCHC: 29.3 g/dL — ABNORMAL LOW (ref 30.0–36.0)
MCV: 100 fL (ref 80.0–100.0)
Monocytes Absolute: 0.7 10*3/uL (ref 0.1–1.0)
Monocytes Relative: 5 %
Neutro Abs: 13.3 10*3/uL — ABNORMAL HIGH (ref 1.7–7.7)
Neutrophils Relative %: 85 %
Platelets: 492 10*3/uL — ABNORMAL HIGH (ref 150–400)
RBC: 2.94 MIL/uL — ABNORMAL LOW (ref 4.22–5.81)
RDW: 16.6 % — ABNORMAL HIGH (ref 11.5–15.5)
WBC: 15.7 10*3/uL — ABNORMAL HIGH (ref 4.0–10.5)
nRBC: 0 % (ref 0.0–0.2)

## 2019-06-03 LAB — POCT I-STAT 7, (LYTES, BLD GAS, ICA,H+H)
Acid-Base Excess: 8 mmol/L — ABNORMAL HIGH (ref 0.0–2.0)
Bicarbonate: 32.1 mmol/L — ABNORMAL HIGH (ref 20.0–28.0)
Calcium, Ion: 1.22 mmol/L (ref 1.15–1.40)
HCT: 27 % — ABNORMAL LOW (ref 39.0–52.0)
Hemoglobin: 9.2 g/dL — ABNORMAL LOW (ref 13.0–17.0)
O2 Saturation: 99 %
Patient temperature: 100
Potassium: 2.8 mmol/L — ABNORMAL LOW (ref 3.5–5.1)
Sodium: 140 mmol/L (ref 135–145)
TCO2: 33 mmol/L — ABNORMAL HIGH (ref 22–32)
pCO2 arterial: 45.7 mmHg (ref 32.0–48.0)
pH, Arterial: 7.458 — ABNORMAL HIGH (ref 7.350–7.450)
pO2, Arterial: 124 mmHg — ABNORMAL HIGH (ref 83.0–108.0)

## 2019-06-03 LAB — BASIC METABOLIC PANEL
Anion gap: 10 (ref 5–15)
BUN: 32 mg/dL — ABNORMAL HIGH (ref 6–20)
CO2: 30 mmol/L (ref 22–32)
Calcium: 8.7 mg/dL — ABNORMAL LOW (ref 8.9–10.3)
Chloride: 101 mmol/L (ref 98–111)
Creatinine, Ser: 0.61 mg/dL (ref 0.61–1.24)
GFR calc Af Amer: >60 mL/min (ref >60)
GFR calc non Af Amer: >60 mL/min (ref >60)
Glucose, Bld: 143 mg/dL — ABNORMAL HIGH (ref 70–99)
Potassium: 3.2 mmol/L — ABNORMAL LOW (ref 3.5–5.1)
Sodium: 141 mmol/L (ref 135–145)

## 2019-06-03 LAB — MAGNESIUM: Magnesium: 2.1 mg/dL (ref 1.7–2.4)

## 2019-06-03 MED ORDER — POTASSIUM CHLORIDE 10 MEQ/50ML IV SOLN
10.0000 meq | INTRAVENOUS | Status: AC
  Administered 2019-06-03 (×4): 10 meq via INTRAVENOUS
  Filled 2019-06-03 (×4): qty 50

## 2019-06-03 MED ORDER — FREE WATER
250.0000 mL | Freq: Three times a day (TID) | Status: DC
  Administered 2019-06-03 – 2019-06-06 (×9): 250 mL

## 2019-06-03 MED ORDER — POTASSIUM CHLORIDE 20 MEQ/15ML (10%) PO SOLN
40.0000 meq | Freq: Once | ORAL | Status: AC
  Administered 2019-06-03: 40 meq via ORAL
  Filled 2019-06-03: qty 30

## 2019-06-03 NOTE — Progress Notes (Signed)
NAME:  Matthew Eaton, MRN:  [id number], DOB:  [birth year], LOS: 23 ADMISSION DATE:  05/31/2019, CONSULTATION DATE:  05/11/2019 REFERRING MD:  [facility], CHIEF COMPLAINT:  hypoxemia   Brief History    59 year old withCovid pneumonia, worsened despite therapy.   Transferred to unit, intubated 05/11/2019.    History of present illness    Patient is a 59 year old male initially admitted 3/31 to 05/12/2019 for Covid pneumonia.  He signed out AMA the evening of 05/26/2019.  Then returned the evening of 4/5 complaining of worsening shortness of breath.  Presenting saturations at that time were in the 80s and he came up into the mid 90s on 100% oxygen.  We were asked to see early this morning in regards to worsening hypoxemia. The patient was noncompliant with his oxygen.  On high flow nasal cannula at 100% the patient had an oxygen saturation of about 79%. With the addition of 100% facemask the patient had oxygen saturation in the mid 80s.  He was verbal throughout and did not look particularly dyspneic.  Patient was urgently transferred to the ICU and intubated.  ventilated on 100% with an oxygen saturation between 85 and 95%. He completed his Decadron, remdesivir, and Actemra.  Chest x-ray performed showed worsening infiltrate particularly throughout the right lung.  Past Medical History   Past Medical History:  Diagnosis Date  . Chronic neck pain 05/20/2012  . Family hx of ALS (amyotrophic lateral sclerosis) 05/27/2013  . GERD (gastroesophageal reflux disease)   . Hiatal hernia   . HLD (hyperlipidemia)   . Major depressive disorder, recurrent episode, moderate ([place]) 05/27/2013  . Neck pain    cervical spine with herniation  . Tobacco abuse   . Wrist fracture, left    Significant Hospital Events    Transferred to ICU intubation 05/11/2019 4/11-proning 4/12-tolerating supine ventilation 4/13-tolerating PEEP wean. 4/18- trach to facilitate vent wean 4/22: ok to come off covid  precautions 4/22: Transferred out of 33M  Consults:  [facility], PCCM  Procedures:  Intubation 05/11/2019 Central line placement 05/11/2019 4/18 trach  Significant Diagnostic Tests:  4/6 ctpa:  1. No demonstrable pulmonary embolus. No thoracic aortic aneurysm. There are foci of aortic atherosclerosis. 2. Widespread ground-glass type opacity consistent with extensive multifocal pneumonia, slightly increased in the right middle lobe and stable albeit extensive elsewhere. Overall there is more infiltrate on the left than on the right, stable. Underlying degree of emphysema. 3.  No evident adenopathy. 4.  Stable moderate hiatal hernia. 5.  No evident adenopathy.  4/7 echo: LVEF 123456, grade I diastolic dysfunc 4/7 LE venous duplex: negative bilaterally for DVT  4/28 CT Chest >> 1. Progressive changes related to the known COVID-19 infection although some new superimposed infiltrates are noted primarily within the lower lobes and right upper lobe as described. 2. New complicated effusion on the right consistent with empyema. Percutaneous drainage may be helpful. 3. Aortic Atherosclerosis (ICD10-I70.0) and Emphysema (ICD10-J43.9).  Micro Data:  4/8 Respiratory cultures-abundant MSSA, Haemophilus influenzae Covid PCR positive 3/31 4/18 resp: MSSA 4/23: Blood culutres >> NGTD 4/23: Respiratory cultures >> Klebsiella and Staph  4/23: Urine cultures >> NGTD 4/30: left pleural fluid>>  Antimicrobials:  Cefepime 4/7-4/10 Ampicillin 4/10-4/11 Augmentin 4/12-4/16 Remdesivir-completed vanc 4/19->4/20 Cefazolin 4/20 >> 4/23 Zosyn 4/23 >> 4/26 Cefazolin 4/26 > 4/29 Unasyn 4/30>>  Interim history/subjective:  Remains tachypneic and uncomfortable appearing on vent. Wife at bedside  Objective   Blood pressure 128/77, pulse (!) 112, temperature 100.2 F (37.9 C), resp. rate Marland Kitchen)  30, height 5\' 10"  (1.778 m), weight 100.6 kg, SpO2 98 %.    Vent Mode: PRVC FiO2 (%):  [50 %]  50 % Set Rate:  [24 bmp] 24 bmp Vt Set:  [550 mL] 550 mL PEEP:  [8 cmH20] 8 cmH20 Plateau Pressure:  [16 cmH20-25 cmH20] 22 cmH20    Intake/Output Summary (Last 24 hours) at 06/03/2019 1134 Last data filed at 06/03/2019 1000 Gross per 24 hour  Intake 2571.06 ml  Output 5690 ml  Net -3118.94 ml   Filed Weights   05/30/19 0500 05/31/19 0409 06/01/19 0332  Weight: 102.5 kg 106.2 kg 100.6 kg   Examination:  GEN: ill appearing man on vent HEENT: Trach in place, minimal secretions CV: Tachycardic, ext warm PULM: Scattered rhonci, R chest tubes with purulent output, no air leak, tachypneic GI: Soft, bruising noted over LLQ from lovenox shots EXT: trace anasarca NEURO: not following commands for me PSYCH: RASS 0 SKIN: No rashes, slightly pale  Chemistries benign Leukocytosis, anemia, and thrombocytosis stable   R PICC 4/26 Urinary catheter 4/24 Right pleural catheter x 2 4/30 Trach 84mm 4/18  Cortrak NGT  R PICC 4/26  Resolved Hospital Problem list     Assessment & Plan:   Acute hypoxemic respiratory failure requiring mechanical ventilation secondary to Covid pneumonia.   Superimposed MSSA, Klebsiella, and haemophilus pneumonia.   R empyema  -High MV requirements -Check ABG to figure out if current WOB is encephalopathy or dead space -Not ready for TC yet -Today: can do some PS, outside window for any benefit to low tidal volume ventilation, diuretic holiday.  Monitor chest tube output, if low output tomorrow will do clamping trial and potential removal, the volume of fluid is not impressive on scan.  Acute encephalopathy, multifactorial metabolic/hypoxic resp failure:  - Need to differentiate between WoB and encephalopathy, see discussion above.  Await ABG. . COVID-19 pneumonia - Previously completed remdesivir and dexamethasone - Off precautions as of 4/21  Hypernatremia:  - Decrease FWF - Continue to monitor, BMET daily  Prediabetes, A1c 6.4 - Continue Lantus 20  units daily and SSI - Goal BG 140-180 while admitted to the ICU  Macrocytic anemia, normal B12 - Folate - Transfuse for hemoglobin less than 7, no current indication. - Continue to monitor  Prolonged QTc at admission - EKG 4/26 with qtc in 520's - Seroquel stopped due to prolonged QTc  Hypokalemia  - Replete  Muscular deconditioning- PT/OT consults, pt remains too somnolent at this time but as improves, either chair position in bed or move to chair  Best practice:  Diet: Tube feeds Pain/Anxiety/Delirium protocol (if indicated): see discussion above Fluid status goals: even to negative as renal function allows VAP protocol (if indicated): In place DVT prophylaxis: Lovenox 40 BID Central lines: none GU: condom catheter. GI prophylaxis: Protonix Glucose control: SSI Mobility: Bedrest Code Status: Full code Family Communication: Wife at bedside Disposition: ICU (LTACH once R pleural space dealt with).   The patient is critically ill with multiple organ systems failure and requires high complexity decision making for assessment and support, frequent evaluation and titration of therapies, application of advanced monitoring technologies and extensive interpretation of multiple databases. Critical Care Time devoted to patient care services described in this note independent of APP/resident time (if applicable)  is 32 minutes.   Erskine Emery MD [facility] 06/03/2019 11:52 AM Personal pager: [phone] If unanswered, please page CCM On-call: [phone]

## 2019-06-03 NOTE — Progress Notes (Signed)
eLink Physician-Brief Progress Note Patient Name: Matthew Eaton DOB: 11/10/1960 MRN: [id number]   Date of Service  06/03/2019  HPI/Events of Note  Pt needs a.m. labs.  eICU Interventions   a.m. labs ordered.        Kerry Kass Meesha Sek 06/03/2019, 4:16 AM

## 2019-06-03 NOTE — Progress Notes (Signed)
[facility] ADULT ICU REPLACEMENT PROTOCOL FOR AM LAB REPLACEMENT ONLY  The patient does apply for the [facility] Adult ICU Electrolyte Replacment Protocol based on the criteria listed below:   1. Is GFR >/= 40 ml/min? Yes.    Patient's GFR today is >60 2. Is urine output >/= 0.5 ml/kg/hr for the last 6 hours? Yes.   Patient's UOP is 1.8 ml/kg/hr 3. Is BUN < 60 mg/dL? Yes.    Patient's BUN today is 38 4. Abnormal electrolyte(s): k 3.2 5. Ordered repletion with: protocol per PICC 6. If a panic level lab has been reported, has the CCM MD in charge been notified? No..   Physician:   BMet already ordered for AM  [facility] [place] A 06/03/2019 12:55 AM

## 2019-06-03 DEATH — deceased

## 2019-06-04 ENCOUNTER — Inpatient Hospital Stay (HOSPITAL_COMMUNITY): Payer: PPO

## 2019-06-04 LAB — BASIC METABOLIC PANEL
Anion gap: 8 (ref 5–15)
BUN: 29 mg/dL — ABNORMAL HIGH (ref 6–20)
CO2: 30 mmol/L (ref 22–32)
Calcium: 8.5 mg/dL — ABNORMAL LOW (ref 8.9–10.3)
Chloride: 104 mmol/L (ref 98–111)
Creatinine, Ser: 0.56 mg/dL — ABNORMAL LOW (ref 0.61–1.24)
GFR calc Af Amer: >60 mL/min (ref >60)
GFR calc non Af Amer: >60 mL/min (ref >60)
Glucose, Bld: 135 mg/dL — ABNORMAL HIGH (ref 70–99)
Potassium: 3.1 mmol/L — ABNORMAL LOW (ref 3.5–5.1)
Sodium: 142 mmol/L (ref 135–145)

## 2019-06-04 LAB — GLUCOSE, CAPILLARY
Glucose-Capillary: 119 mg/dL — ABNORMAL HIGH (ref 70–99)
Glucose-Capillary: 121 mg/dL — ABNORMAL HIGH (ref 70–99)
Glucose-Capillary: 126 mg/dL — ABNORMAL HIGH (ref 70–99)
Glucose-Capillary: 128 mg/dL — ABNORMAL HIGH (ref 70–99)
Glucose-Capillary: 130 mg/dL — ABNORMAL HIGH (ref 70–99)
Glucose-Capillary: 143 mg/dL — ABNORMAL HIGH (ref 70–99)
Glucose-Capillary: 150 mg/dL — ABNORMAL HIGH (ref 70–99)

## 2019-06-04 LAB — CBC
HCT: 29 % — ABNORMAL LOW (ref 39.0–52.0)
Hemoglobin: 8.6 g/dL — ABNORMAL LOW (ref 13.0–17.0)
MCH: 29.8 pg (ref 26.0–34.0)
MCHC: 29.7 g/dL — ABNORMAL LOW (ref 30.0–36.0)
MCV: 100.3 fL — ABNORMAL HIGH (ref 80.0–100.0)
Platelets: 500 10*3/uL — ABNORMAL HIGH (ref 150–400)
RBC: 2.89 MIL/uL — ABNORMAL LOW (ref 4.22–5.81)
RDW: 16.4 % — ABNORMAL HIGH (ref 11.5–15.5)
WBC: 13.4 10*3/uL — ABNORMAL HIGH (ref 4.0–10.5)
nRBC: 0 % (ref 0.0–0.2)

## 2019-06-04 LAB — PHOSPHORUS: Phosphorus: 3.6 mg/dL (ref 2.5–4.6)

## 2019-06-04 LAB — MAGNESIUM: Magnesium: 2.2 mg/dL (ref 1.7–2.4)

## 2019-06-04 MED ORDER — CEFAZOLIN SODIUM-DEXTROSE 2-4 GM/100ML-% IV SOLN
2.0000 g | Freq: Three times a day (TID) | INTRAVENOUS | Status: DC
  Administered 2019-06-04 – 2019-06-06 (×5): 2 g via INTRAVENOUS
  Filled 2019-06-04 (×6): qty 100

## 2019-06-04 MED ORDER — POTASSIUM CHLORIDE 20 MEQ/15ML (10%) PO SOLN
40.0000 meq | Freq: Four times a day (QID) | ORAL | Status: AC
  Administered 2019-06-04 (×2): 40 meq
  Filled 2019-06-04 (×2): qty 30

## 2019-06-04 MED ORDER — SODIUM CHLORIDE 0.9 % IV SOLN: INTRAVENOUS | Status: DC | PRN

## 2019-06-04 NOTE — Progress Notes (Signed)
NAME:  Matthew Eaton, MRN:  [id number], DOB:  1960-11-16, LOS: 26 ADMISSION DATE:  05/30/2019, CONSULTATION DATE:  05/11/2019 REFERRING MD:  [facility], CHIEF COMPLAINT:  hypoxemia   Brief History    59 year old withCovid pneumonia, worsened despite therapy.   Transferred to unit, intubated 05/11/2019.    History of present illness    Patient is a 59 year old male initially admitted 3/31 to 05/26/2019 for Covid pneumonia.  He signed out AMA the evening of 05/11/2019.  Then returned the evening of 4/5 complaining of worsening shortness of breath.  Presenting saturations at that time were in the 80s and he came up into the mid 90s on 100% oxygen.  We were asked to see early this morning in regards to worsening hypoxemia. The patient was noncompliant with his oxygen.  On high flow nasal cannula at 100% the patient had an oxygen saturation of about 79%. With the addition of 100% facemask the patient had oxygen saturation in the mid 80s.  He was verbal throughout and did not look particularly dyspneic.  Patient was urgently transferred to the ICU and intubated.  ventilated on 100% with an oxygen saturation between 85 and 95%. He completed his Decadron, remdesivir, and Actemra.  Chest x-ray performed showed worsening infiltrate particularly throughout the right lung.  Past Medical History   Past Medical History:  Diagnosis Date   Chronic neck pain 05/20/2012   Family hx of ALS (amyotrophic lateral sclerosis) 05/27/2013   GERD (gastroesophageal reflux disease)    Hiatal hernia    HLD (hyperlipidemia)    Major depressive disorder, recurrent episode, moderate ([place]) 05/27/2013   Neck pain    cervical spine with herniation   Tobacco abuse    Wrist fracture, left    Significant Hospital Events    Transferred to ICU intubation 05/11/2019 4/11-proning 4/12-tolerating supine ventilation 4/13-tolerating PEEP wean. 4/18- trach to facilitate vent wean 4/22: ok to come off covid  precautions 4/22: Transferred out of 76M  Consults:  [facility], PCCM  Procedures:  Intubation 05/11/2019 Central line placement 05/11/2019 4/18 trach  Significant Diagnostic Tests:  4/6 ctpa:  1. No demonstrable pulmonary embolus. No thoracic aortic aneurysm. There are foci of aortic atherosclerosis. 2. Widespread ground-glass type opacity consistent with extensive multifocal pneumonia, slightly increased in the right middle lobe and stable albeit extensive elsewhere. Overall there is more infiltrate on the left than on the right, stable. Underlying degree of emphysema. 3.  No evident adenopathy. 4.  Stable moderate hiatal hernia. 5.  No evident adenopathy.  4/7 echo: LVEF 123456, grade I diastolic dysfunc 4/7 LE venous duplex: negative bilaterally for DVT  4/28 CT Chest >> 1. Progressive changes related to the known COVID-19 infection although some new superimposed infiltrates are noted primarily within the lower lobes and right upper lobe as described. 2. New complicated effusion on the right consistent with empyema. Percutaneous drainage may be helpful. 3. Aortic Atherosclerosis (ICD10-I70.0) and Emphysema (ICD10-J43.9).  Micro Data:  4/8 Respiratory cultures-abundant MSSA, Haemophilus influenzae Covid PCR positive 3/31 4/18 resp: MSSA 4/23: Blood culutres >> NGTD 4/23: Respiratory cultures >> Klebsiella and Staph  4/23: Urine cultures >> NGTD 4/30: left pleural fluid>>Klebsiella  Antimicrobials:  Cefepime 4/7-4/10 Ampicillin 4/10-4/11 Augmentin 4/12-4/16 Remdesivir-completed vanc 4/19->4/20 Cefazolin 4/20 >> 4/23 Zosyn 4/23 >> 4/26 Cefazolin 4/26 > 4/29 Unasyn 4/30>>  Interim history/subjective:  Remains tachypneic and uncomfortable appearing on vent. Wife at bedside No progress Needs MV 18-20LPM to maintain pH. Minimal chest tube output.  Objective   Blood  pressure 112/70, pulse 94, temperature 99.5 F (37.5 C), resp. rate (!) 24, height 5\' 10"   (1.778 m), weight 100.6 kg, SpO2 98 %.    Vent Mode: PRVC FiO2 (%):  [50 %] 50 % Set Rate:  [24 bmp] 24 bmp Vt Set:  [550 mL] 550 mL PEEP:  [8 cmH20] 8 cmH20 Plateau Pressure:  [19 cmH20-24 cmH20] 21 cmH20    Intake/Output Summary (Last 24 hours) at 06/04/2019 1431 Last data filed at 06/04/2019 1300 Gross per 24 hour  Intake 1635 ml  Output 2540 ml  Net -905 ml   Filed Weights   05/30/19 0500 05/31/19 0409 06/01/19 0332  Weight: 102.5 kg 106.2 kg 100.6 kg   Examination:  GEN: ill appearing man on vent HEENT: Trach in place, minimal secretions CV: Tachycardic, ext warm PULM: Scattered rhonci, R chest tubes with purulent output, no air leak, tachypneic GI: Soft, bruising noted over LLQ from lovenox shots EXT: trace anasarca NEURO: not following commands for me PSYCH: RASS 0 SKIN: No rashes, slightly pale  Chemistries benign Leukocytosis, anemia, and thrombocytosis stable   R PICC 4/26 Urinary catheter 4/24 Right pleural catheter x 2 4/30 Trach 67mm 4/18  Cortrak NGT  R PICC 4/26  Resolved Hospital Problem list     Assessment & Plan:   -Acute hypoxemic respiratory failure requiring mechanical ventilation secondary  to Covid pneumonia 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  I had frank talk with wife today that I do not think he will survive this.  She is not ready to switch goals of care at this time.  I will reach out to [facility] to see if he can be evaluated for transplant but think degree of deconditioning and ongoing encephalopathy may be barrier here.  Management consultant + Insurance [phone]  Acute encephalopathy, multifactorial metabolic/hypoxic resp failure:  - Complicated by high deadspace; he is moving all 4  ext; his WOB leading to PRN IV benzos and opiates - Overall approaching futility, want him to be comfortable so will leave PRNs for now  COVID-19 pneumonia - Previously completed remdesivir and dexamethasone - Off precautions as of 4/21  Electrolyte disturbances:  - Continue FWF, replete K - Continue to monitor, BMET daily  Prediabetes, A1c 6.4 - Continue Lantus 20 units daily and SSI - Goal BG 100-180 while admitted to the ICU  Macrocytic anemia, normal B12 - Folate - Transfuse for hemoglobin less than 7, no current indication. - Continue to monitor  Prolonged QTc at admission - EKG 4/26 with qtc in 520's - Seroquel stopped due to prolonged QTc  Muscular deconditioning- PT/OT consults, pt remains too somnolent at this time but as improves, either chair position in bed or move to chair  Best practice:  Diet: Tube feeds Pain/Anxiety/Delirium protocol (if indicated): see discussion above Fluid status goals: even to negative as renal function allows VAP protocol (if indicated): In place DVT prophylaxis: Lovenox 40 BID Central lines: none GU: condom catheter. GI prophylaxis: Protonix Glucose control: SSI Mobility: Bedrest Code Status: Full code Family Communication: Wife at bedside see discussion above Disposition: ICU  The patient is critically ill with multiple organ systems failure and requires high complexity decision making for assessment and support, frequent evaluation and titration of therapies, application of advanced monitoring technologies  and extensive interpretation of multiple databases. Critical Care Time devoted to patient care services described in this note independent of APP/resident time (if applicable)  is 35 minutes.   Erskine Emery MD [facility] 06/04/2019 2:31 PM Personal pager: [phone] If unanswered, please page CCM On-call: [phone]

## 2019-06-04 NOTE — Progress Notes (Signed)
Assisted tele visit to patient with family member.  [place] D Josel Keo, RN   

## 2019-06-05 DIAGNOSIS — L8990 Pressure ulcer of unspecified site, unspecified stage: Secondary | ICD-10-CM | POA: Insufficient documentation

## 2019-06-05 DIAGNOSIS — J8410 Pulmonary fibrosis, unspecified: Secondary | ICD-10-CM

## 2019-06-05 LAB — BASIC METABOLIC PANEL
Anion gap: 8 (ref 5–15)
BUN: 25 mg/dL — ABNORMAL HIGH (ref 6–20)
CO2: 29 mmol/L (ref 22–32)
Calcium: 8.6 mg/dL — ABNORMAL LOW (ref 8.9–10.3)
Chloride: 108 mmol/L (ref 98–111)
Creatinine, Ser: 0.59 mg/dL — ABNORMAL LOW (ref 0.61–1.24)
GFR calc Af Amer: >60 mL/min (ref >60)
GFR calc non Af Amer: >60 mL/min (ref >60)
Glucose, Bld: 132 mg/dL — ABNORMAL HIGH (ref 70–99)
Potassium: 4.1 mmol/L (ref 3.5–5.1)
Sodium: 145 mmol/L (ref 135–145)

## 2019-06-05 LAB — CBC
HCT: 29.6 % — ABNORMAL LOW (ref 39.0–52.0)
Hemoglobin: 8.7 g/dL — ABNORMAL LOW (ref 13.0–17.0)
MCH: 29.7 pg (ref 26.0–34.0)
MCHC: 29.4 g/dL — ABNORMAL LOW (ref 30.0–36.0)
MCV: 101 fL — ABNORMAL HIGH (ref 80.0–100.0)
Platelets: 547 10*3/uL — ABNORMAL HIGH (ref 150–400)
RBC: 2.93 MIL/uL — ABNORMAL LOW (ref 4.22–5.81)
RDW: 16.5 % — ABNORMAL HIGH (ref 11.5–15.5)
WBC: 11.7 10*3/uL — ABNORMAL HIGH (ref 4.0–10.5)
nRBC: 0 % (ref 0.0–0.2)

## 2019-06-05 LAB — GLUCOSE, CAPILLARY
Glucose-Capillary: 136 mg/dL — ABNORMAL HIGH (ref 70–99)
Glucose-Capillary: 110 mg/dL — ABNORMAL HIGH (ref 70–99)
Glucose-Capillary: 136 mg/dL — ABNORMAL HIGH (ref 70–99)
Glucose-Capillary: 117 mg/dL — ABNORMAL HIGH (ref 70–99)
Glucose-Capillary: 169 mg/dL — ABNORMAL HIGH (ref 70–99)
Glucose-Capillary: 159 mg/dL — ABNORMAL HIGH (ref 70–99)

## 2019-06-05 LAB — PHOSPHORUS: Phosphorus: 3.1 mg/dL (ref 2.5–4.6)

## 2019-06-05 LAB — MAGNESIUM: Magnesium: 2.4 mg/dL (ref 1.7–2.4)

## 2019-06-05 NOTE — Consult Note (Signed)
[facility] Nurse Consult Note: Patient receiving care in [facility] 2M05.  Primary RN, Lilia Pro, present at time of my assessment Reason for Consult: possible stage 2 injury to buttocks Wound type: NO wound observed. Rectal pouch intact, no fecal output.  Lilia Pro states that the patient had had a Flexiseal, and when he did, there was redness around the anus related to fecal effluent leakage onto the skin.  Today to barrier of the rectal pouch is in proper position without feces in pouch, and the barrier is over the area of question.  This will protect any skin irritation that might have been present. Pressure Injury POA: Yes/No/NA Measurement: Wound bed: Drainage (amount, consistency, odor)  Periwound: Dressing procedure/placement/frequency: continue containment of feces. Thank you for the consult.  Discussed plan of care with the bedside nurse.  [place] nurse will not follow at this time.  Please re-consult the [place] team if needed.  Val Riles, RN, MSN, CWOCN, CNS-BC, pager [phone]

## 2019-06-05 NOTE — Progress Notes (Signed)
  Denied by [facility] for lung transplant.  Wife updated and expectedly emotionally distraught in that our options moving forward are limited.  She states he would not want to live like this, on prolonged life support with very little quality of life.    She ask that he not suffer.  Plans are to transition to comfort care but not tonight as wife needs times to process and family to visit.   Once family has time to visit, suspect in the next day or so, we will transition to full comfort care.  In the interim, wife agrees that if he were to decompensate to cardiac arrest, CPR would offer no benefit and does not want CPR.    P:  If he decompensates, will need to call family in.   Will change code status to No CPR.  Continue current therapies till wife decides time /day of liberation from vent/ transition to comfort care.   Chaplin called, ongoing emotional support     Kennieth Rad, MSN, AGACNP-BC [facility] 06/05/2019, 7:25 PM  See Amion for personal pager PCCM on call pager [phone]

## 2019-06-05 NOTE — Progress Notes (Signed)
Chaplain offered support to wife.  They had been a couple for 34 years.  Second marriages..they have two children together. Just bought a beach house for later years, and only got to use it one time. Sister of patient has arrived in town and wants to come see him.  Chaplain confirmed a second person was allowed in room. Wife, distraught, was calling family while I was there.  Visibly shaking.  Chaplain offered ministry of presence and nurse brought beverage and cold towel.  Chaplain offered words of scripture and prayer and will continue to be available this evening. Rev. Tamsen Snider Pager [phone]

## 2019-06-05 NOTE — Progress Notes (Signed)
Called [facility] transfer center to get update on transplant evaluation.  Physician is still reviewing chart.  Will let us know, hopefully soon.        Kennieth Rad, MSN, AGACNP-BC [facility] 06/05/2019, 2:38 PM

## 2019-06-05 NOTE — Progress Notes (Addendum)
NAME:  Jahri Epperson, MRN:  [id number], DOB:  03-Dec-1960, LOS: 32 ADMISSION DATE:  05/28/2019, CONSULTATION DATE:  05/11/2019 REFERRING MD:  [facility], CHIEF COMPLAINT:  hypoxemia   Brief History    59 year old withCovid pneumonia, worsened despite therapy.   Transferred to unit, intubated 05/11/2019.    History of present illness    Patient is a 59 year old male initially admitted 3/31 to 05/10/2019 for Covid pneumonia.  He signed out AMA the evening of 05/16/2019.  Then returned the evening of 4/5 complaining of worsening shortness of breath.  Presenting saturations at that time were in the 80s and he came up into the mid 90s on 100% oxygen.  We were asked to see early this morning in regards to worsening hypoxemia. The patient was noncompliant with his oxygen.  On high flow nasal cannula at 100% the patient had an oxygen saturation of about 79%. With the addition of 100% facemask the patient had oxygen saturation in the mid 80s.  He was verbal throughout and did not look particularly dyspneic.  Patient was urgently transferred to the ICU and intubated.  ventilated on 100% with an oxygen saturation between 85 and 95%. He completed his Decadron, remdesivir, and Actemra.  Chest x-ray performed showed worsening infiltrate particularly throughout the right lung.  Past Medical History   Past Medical History:  Diagnosis Date  . Chronic neck pain 05/20/2012  . Family hx of ALS (amyotrophic lateral sclerosis) 05/27/2013  . GERD (gastroesophageal reflux disease)   . Hiatal hernia   . HLD (hyperlipidemia)   . Major depressive disorder, recurrent episode, moderate ([place]) 05/27/2013  . Neck pain    cervical spine with herniation  . Tobacco abuse   . Wrist fracture, left    Significant Hospital Events    Transferred to ICU intubation 05/11/2019 4/11-proning 4/12-tolerating supine ventilation 4/13-tolerating PEEP wean. 4/18- trach to facilitate vent wean 4/22: ok to come off covid  precautions 4/22: Transferred out of 21M-> 37M  Consults:  [facility], PCCM  Procedures:  Intubation 05/11/2019 >> 4/18 CVL  05/11/2019  >> out 4/18 6 shiley trach >> R PICC 4/26  >> Urinary catheter 4/24 >> Right pleural catheter x 2 4/30 >> Cortrak NGT  Significant Diagnostic Tests:  4/6 ctpa:  1. No demonstrable pulmonary embolus. No thoracic aortic aneurysm. There are foci of aortic atherosclerosis. 2. Widespread ground-glass type opacity consistent with extensive multifocal pneumonia, slightly increased in the right middle lobe and stable albeit extensive elsewhere. Overall there is more infiltrate on the left than on the right, stable. Underlying degree of emphysema. 3.  No evident adenopathy. 4.  Stable moderate hiatal hernia. 5.  No evident adenopathy.  4/7 echo: LVEF 123456, grade I diastolic dysfunc 4/7 LE venous duplex: negative bilaterally for DVT  4/28 CT Chest >> 1. Progressive changes related to the known COVID-19 infection although some new superimposed infiltrates are noted primarily within the lower lobes and right upper lobe as described. 2. New complicated effusion on the right consistent with empyema. Percutaneous drainage may be helpful. 3. Aortic Atherosclerosis (ICD10-I70.0) and Emphysema (ICD10-J43.9).  Micro Data:  4/8 Respiratory cultures-abundant MSSA, Haemophilus influenzae Covid PCR positive 3/31 4/18 resp: MSSA 4/23: Blood culutres >> neg 4/23: Respiratory cultures >> Klebsiella and Staph  4/23: Urine cultures >> NGTD 4/30: left pleural fluid>>Klebsiella  Antimicrobials:  Cefepime 4/7-4/10 Ampicillin 4/10-4/11 Augmentin 4/12-4/16 Remdesivir-completed vanc 4/19->4/20 Cefazolin 4/20 >> 4/23; 5/2 >> Zosyn 4/23 >> 4/26 Cefazolin 4/26 > 4/29 Unasyn 4/30>>  Interim history/subjective:  Afebrile  Ongoing tachypnea and uncomfortable appearing Both pigtail CT were briefly clamped yesterday, then became more restless, unclamped but  no improvement in his appearance, still requiring prn sedation.  Both CTs remains on suction, scant output #1, ~20 on #2  Objective   Blood pressure (!) 140/92, pulse (!) 103, temperature 100 F (37.8 C), resp. rate (!) 27, height 5\' 10"  (1.778 m), weight 95.6 kg, SpO2 97 %.    Vent Mode: PRVC FiO2 (%):  [50 %] 50 % Set Rate:  [24 bmp] 24 bmp Vt Set:  [550 mL] 550 mL PEEP:  [8 cmH20] 8 cmH20 Plateau Pressure:  [21 cmH20-29 cmH20] 26 cmH20    Intake/Output Summary (Last 24 hours) at 06/05/2019 0739 Last data filed at 06/05/2019 0700 Gross per 24 hour  Intake 4766.38 ml  Output 1770 ml  Net 2996.38 ml   Filed Weights   06/01/19 0332 06/04/19 0500 06/05/19 0500  Weight: 100.6 kg 100.6 kg 95.6 kg   Examination:  General:  Chronically ill, uncomfortable appearing male on MV/ trach HEENT: MM pink/moist, pupils 3/reactive, left cortrak, midline 6 shiley trach- cdi Neuro: eye opens, does not follow commands or withdrawal to noxious stimuli, moves upper extremities spont CV: ST, no murmur PULM:  tachypneic over set MV rate, lungs coarse, scattered rhonchi, right pigtail chest tube x 2 on continuous 20 cm suction, scant output, no airleak, #1 with scant clear yellow, #2 with minimal purulent output  GI: soft, bs +, foley, rectal pouch, bruising scattered to abd  Extremities: warm/dry, no LE edema  Skin: no rashes  Resolved Hospital Problem list     Assessment & Plan:   -Acute hypoxemic respiratory failure requiring mechanical ventilation secondary  to Covid pneumonia 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  If stable,  consider clamping pigtail #1 later today.  Repeat CXR in am, if no reaccumulate , will d/c in am.   -  Still waiting to hear from [facility] to see if he would be transplant candidate, however, given his deconditioning and continued encephalopathy likely a barrier  Fax [place]  [phone] - prognosis remains poor - continue ancef and unasyn   Acute encephalopathy, multifactorial metabolic/hypoxic resp failure:  - remains non- focal - continue prn fentanyl/ versed to help with his comfort with bowel regimen - continue enteral scheduled klonopin/ oxy, wellbutrin  COVID-19 pneumonia - Previously completed remdesivir and dexamethasone - Off precautions as of 4/21  Electrolyte disturbances:  - continue free water  - Continue to monitor, BMET daily - net +3, appears euvolemic on exam, wts stable, defer diuresis today   Prediabetes, A1c 6.4 - Continue Lantus 20 units daily and SSI - Goal BG 100-180 while admitted to the ICU  Macrocytic anemia, normal B12 - Folate - Transfuse for hemoglobin less than 7, no current indication - Continue to monitor  Prolonged QTc at admission - EKG 4/26 with qtc in 520's - Seroquel stopped due to prolonged QTc  Muscular deconditioning- PT/OT consults, however encephalopathy remains a barrier.     Best practice:  Diet: Tube feeds Pain/Anxiety/Delirium protocol (if indicated): see discussion above  Fluid status goals: even to negative as renal function allows VAP protocol (if indicated): In place DVT prophylaxis: Lovenox 40 BID Central lines: PICC  GU: foley catheter, can attempt removal again, monitor for retention  GI prophylaxis: Protonix Glucose control: SSI Mobility: Bedrest Code Status: Full code Family Communication: pending, wife currently not at bedside.  Disposition: ICU  CCT 35 mins   Kennieth Rad, MSN, AGACNP-BC [facility] 06/05/2019, 7:39 AM

## 2019-06-05 NOTE — Progress Notes (Signed)
Referring Physician(s): Dr. Ruthann Cancer  Supervising Physician: Arne Cleveland  Patient Status:  [facility] - In-pt  Chief Complaint: Empyema  Subjective: Encephalopathic, in restraints.  Right-sided chest tubes in place (x2) #1 Anterior chest tube for PTX #2 Empyema drain   Allergies: Gabapentin and Methocarbamol  Medications: Prior to Admission medications   Medication Sig Start Date End Date Taking? Authorizing Provider  aspirin 81 MG tablet Take 81 mg by mouth daily.      [provider]  atorvastatin (LIPITOR) 80 MG tablet TAKE 1 TABLET BY MOUTH DAILY AT 6 PM. Patient taking differently: Take 80 mg by mouth daily with supper.  09/12/18   Copland, Frederico Hamman, MD  buPROPion (WELLBUTRIN XL) 300 MG 24 hr tablet TAKE 1 TABLET BY MOUTH DAILY. Patient taking differently: Take 300 mg by mouth daily.  01/24/19   Copland, Frederico Hamman, MD  Cholecalciferol (VITAMIN D3) 50 MCG (2000 UT) TABS Take 2,000 Units by mouth daily with breakfast.    [provider]  citalopram (CELEXA) 40 MG tablet TAKE 1 TABLET BY MOUTH DAILY. Patient taking differently: Take 40 mg by mouth at bedtime.  01/09/19   Copland, Frederico Hamman, MD  clonazePAM (KLONOPIN) 0.5 MG tablet TAKE 1 TABLET BY MOUTH TWICE A DAY AS NEEDED Patient taking differently: Take 0.5 mg by mouth in the morning and at bedtime.  11/28/18   Copland, Frederico Hamman, MD  HYDROcodone-acetaminophen (NORCO) 10-325 MG tablet TAKE 1 TABLET BY MOUTH EVERY 6 HOURS AS NEEDED FOR SEVERE PAIN. Patient not taking: Reported on 04/06/2019 04/03/19   Copland, Frederico Hamman, MD  HYDROcodone-acetaminophen ([place]) 10-325 MG tablet TAKE 1 TABLET BY MOUTH EVERY 6 HOURS AS NEEDED FOR SEVERE PAIN Patient taking differently: Take 0.5-1 tablets by mouth every 6 (six) hours as needed for severe pain.  04/03/19   Copland, Frederico Hamman, MD  HYDROcodone-acetaminophen (NORCO) 10-325 MG tablet Take 1 tablet by mouth every 6 (six) hours as needed for severe pain. Patient not taking: Reported on  04/19/2019 04/03/19   Owens Loffler, MD  ibuprofen (ADVIL) 200 MG tablet Take 400 mg by mouth every 6 (six) hours as needed (for headaches).    [provider]  Melatonin 10 MG TABS Take 10-20 mg by mouth at bedtime as needed (for sleep).    [provider]  nortriptyline (PAMELOR) 25 MG capsule Take 1 capsule (25 mg total) by mouth at bedtime. Patient not taking: Reported on 05/01/2019 05/04/18   Copland, Frederico Hamman, MD  omeprazole (PRILOSEC) 20 MG capsule TAKE 1 CAPSULE BY MOUTH 2 TIMES DAILY BEFORE A MEAL. Patient taking differently: Take 20 mg by mouth 2 (two) times daily before a meal.  09/16/18   Copland, Frederico Hamman, MD     Vital Signs: BP 133/74   Pulse (!) 108   Temp (!) 100.4 F (38 C)   Resp (!) 26   Ht 5\' 10"  (1.778 m)   Wt 210 lb 12.2 oz (95.6 kg)   SpO2 96%   BMI 30.24 kg/m   Physical Exam  NAD, encephalopathic Respiratory: Right-sided chest tubes in place. #1 pneumothorax drain, to seal. Minimal output as expected. #2 empyema drain with 280 mL in Pleurvac.   Imaging: DG Chest Port 1 View  Result Date: 06/04/2019 CLINICAL DATA:  Chest tube.  COVID-19 positive. EXAM: PORTABLE CHEST 1 VIEW COMPARISON:  06/03/2019 FINDINGS: Tracheostomy tube unchanged. Enteric tube courses into the stomach and off the film as tip is not visualized. 2 pigtail right basilar pleural drainage catheters are present. Right-sided PICC line unchanged.  Lungs are adequately inflated demonstrate stable patchy bilateral moderate airspace process compatible with known multifocal pneumonia. Tiny amount right pleural fluid. No pneumothorax. Cardiomediastinal silhouette and remainder of the exam is unchanged. IMPRESSION: 1. Stable bilateral patchy moderate airspace process compatible with known multifocal pneumonia. Tiny amount right pleural fluid. 2.  Tubes and lines as described. Electronically Signed   By: Marin Olp M.D.   On: 06/04/2019 08:30   DG Chest Port 1 View  Result Date:  06/03/2019 CLINICAL DATA:  Acute respiratory failure EXAM: PORTABLE CHEST 1 VIEW COMPARISON:  06/02/2019 FINDINGS: Unchanged position of tracheostomy 2. Enteric tube courses below the field of view. Pigtail catheters at the right lung base are also unchanged. Unchanged appearance of loculated right pleural effusion along the lateral aspect of the pleura. No visible pneumothorax. Opacities throughout both lungs are unchanged. IMPRESSION: 1. Unchanged appearance of loculated right pleural effusion with pigtail catheters in the right lung base. 2. Unchanged appearance of bilateral lung opacities. Electronically Signed   By: Ulyses Jarred M.D.   On: 06/03/2019 04:58   DG CHEST PORT 1 VIEW  Result Date: 06/02/2019 CLINICAL DATA:  Chest tube EXAM: PORTABLE CHEST 1 VIEW COMPARISON:  Chest x-rays dated 05/10/2019 and 05/27/2019. FINDINGS: Tracheostomy tube appears appropriately positioned in the midline. Enteric tube passes below the diaphragm. 2 pigtail chest tubes are present at the RIGHT lung base. No pneumothorax is seen. Suspected small loculated pleural effusion along the RIGHT lateral lung, stable. Diffuse reticular opacities are again seen throughout the LEFT lung. Additional patchy airspace opacities and scarring/atelectasis within the RIGHT lung, also stable. IMPRESSION: 1. New pigtail catheters at the RIGHT lung base. 2. Stable bilateral pneumonia. Electronically Signed   By: Franki Cabot M.D.   On: 06/02/2019 10:49   CT IMAGE GUIDED DRAINAGE BY PERCUTANEOUS CATHETER  Result Date: 06/02/2019 INDICATION: Respiratory failure, recent COVID pneumonia and right empyema. EXAM: 1. CT-GUIDED PLACEMENT OF RIGHT THORACOSTOMY TUBE PLACEMENT FOR PNEUMOTHORAX 2. CT-GUIDED PLACEMENT OF RIGHT THORACOSTOMY TUBE PLACEMENT FOR DRAINAGE OF EMPYEMA MEDICATIONS: The patient is currently admitted to the hospital and receiving intravenous antibiotics. The antibiotics were administered within an appropriate time frame prior to  the initiation of the procedure. ANESTHESIA/SEDATION: The patient has a tracheostomy tube and was on a ventilator during the procedure. He was not formally given moderate conscious sedation. COMPLICATIONS: None immediate. PROCEDURE: Informed written consent was obtained from the patient's wife after a thorough discussion of the procedural risks, benefits and alternatives. All questions were addressed. Maximal Sterile Barrier Technique was utilized including caps, mask, sterile gowns, sterile gloves, sterile drape, hand hygiene and skin antiseptic. A timeout was performed prior to the initiation of the procedure. CT was performed with the right side rolled up slightly. Under CT guidance, 18 gauge trocar needles were separately advanced into the anterior right lower pleural space and the posterior right lower pleural space. After confirming needle tip position, guidewires were advanced, the tracts dilated and separate 12 French pigtail drainage catheters advanced. Catheter position was confirmed by CT. Both catheters were secured at the skin with Prolene retention sutures and StatLock devices. Both drainage catheters were connected to [place]. A sample of fluid from the posterior right pleural collection was sent for culture analysis. FINDINGS: Initial imaging shows a new right anterior pneumothorax of approximately 20% volume which was not present on the prior CT of the chest on 05/31/2019. Loculated posterior pleural collection containing fluid and air appears stable. A 12 French pigtail catheter was placed anteriorly  to treat the pneumothorax. Catheter positioning is appropriate in the anterior pleural space. A second 12 French catheter was placed in the posterior pleural empyema at yielding grossly purulent appearing fluid. A sample was sent for culture analysis. IMPRESSION: 1. New roughly 20% right anterior pneumothorax which was treated with placement of a 12 French pigtail thoracostomy tube.  This was attached to a Pleur-evac device and will be connected to wall suction at -20 cm of water. 2. 12 French drainage catheter placed in the posterior right pleural empyema yielding grossly purulent fluid. A sample was sent for culture analysis. The drain was attached to a Pleur-evac device and will be connected to wall suction at -20 cm of water. Electronically Signed   By: Aletta Edouard M.D.   On: 06/02/2019 16:13   CT [facility] PLEURAL DRAIN W/INDWELL CATH W/IMG GUIDE  Result Date: 06/02/2019 INDICATION: Respiratory failure, recent COVID pneumonia and right empyema. EXAM: 1. CT-GUIDED PLACEMENT OF RIGHT THORACOSTOMY TUBE PLACEMENT FOR PNEUMOTHORAX 2. CT-GUIDED PLACEMENT OF RIGHT THORACOSTOMY TUBE PLACEMENT FOR DRAINAGE OF EMPYEMA MEDICATIONS: The patient is currently admitted to the hospital and receiving intravenous antibiotics. The antibiotics were administered within an appropriate time frame prior to the initiation of the procedure. ANESTHESIA/SEDATION: The patient has a tracheostomy tube and was on a ventilator during the procedure. He was not formally given moderate conscious sedation. COMPLICATIONS: None immediate. PROCEDURE: Informed written consent was obtained from the patient's wife after a thorough discussion of the procedural risks, benefits and alternatives. All questions were addressed. Maximal Sterile Barrier Technique was utilized including caps, mask, sterile gowns, sterile gloves, sterile drape, hand hygiene and skin antiseptic. A timeout was performed prior to the initiation of the procedure. CT was performed with the right side rolled up slightly. Under CT guidance, 18 gauge trocar needles were separately advanced into the anterior right lower pleural space and the posterior right lower pleural space. After confirming needle tip position, guidewires were advanced, the tracts dilated and separate 12 French pigtail drainage catheters advanced. Catheter position was confirmed by CT. Both  catheters were secured at the skin with Prolene retention sutures and StatLock devices. Both drainage catheters were connected to [place]. A sample of fluid from the posterior right pleural collection was sent for culture analysis. FINDINGS: Initial imaging shows a new right anterior pneumothorax of approximately 20% volume which was not present on the prior CT of the chest on 05/31/2019. Loculated posterior pleural collection containing fluid and air appears stable. A 12 French pigtail catheter was placed anteriorly to treat the pneumothorax. Catheter positioning is appropriate in the anterior pleural space. A second 12 French catheter was placed in the posterior pleural empyema at yielding grossly purulent appearing fluid. A sample was sent for culture analysis. IMPRESSION: 1. New roughly 20% right anterior pneumothorax which was treated with placement of a 12 French pigtail thoracostomy tube. This was attached to a Pleur-evac device and will be connected to wall suction at -20 cm of water. 2. 12 French drainage catheter placed in the posterior right pleural empyema yielding grossly purulent fluid. A sample was sent for culture analysis. The drain was attached to a Pleur-evac device and will be connected to wall suction at -20 cm of water. Electronically Signed   By: Aletta Edouard M.D.   On: 06/02/2019 16:13    Labs:  CBC: Recent Labs    06/02/19 0438 06/02/19 0438 06/03/19 0531 06/03/19 1206 06/04/19 0522 06/05/19 0500  WBC 16.2*  --  15.7*  --  13.4* 11.7*  HGB 8.3*   < > 8.6* 9.2* 8.6* 8.7*  HCT 27.8*   < > 29.4* 27.0* 29.0* 29.6*  PLT 454*  --  492*  --  500* 547*   < > = values in this interval not displayed.    COAGS: Recent Labs    04/15/2019 1614 06/02/19 0438  INR 0.9 1.1    BMP: Recent Labs    06/02/19 2035 06/02/19 2035 06/03/19 0531 06/03/19 1206 06/04/19 0522 06/05/19 0500  NA 142   < > 141 140 142 145  K 3.2*   < > 3.2* 2.8* 3.1* 4.1  CL 99   --  101  --  104 108  CO2 32  --  30  --  30 29  GLUCOSE 152*  --  143*  --  135* 132*  BUN 38*  --  32*  --  29* 25*  CALCIUM 8.7*  --  8.7*  --  8.5* 8.6*  CREATININE 0.71  --  0.61  --  0.56* 0.59*  GFRNONAA >60  --  >60  --  >60 >60  GFRAA >60  --  >60  --  >60 >60   < > = values in this interval not displayed.    LIVER FUNCTION TESTS: Recent Labs    05/09/19 0710 05/10/19 0442 05/12/19 0530 05/26/19 1003  BILITOT 0.8 0.8 0.7 0.4  AST 75* 84* 44* 109*  ALT 133* 141* 106* 78*  ALKPHOS 75 80 94 226*  PROT 5.4* 5.4* 5.0* 5.8*  ALBUMIN 2.9* 2.9* 2.4* 1.3*    Assessment and Plan: Empyema s/p posterior chest tube placed 4/30 by Dr. Kathlene Cote Pneumothorax, incidental finding at the time of empyema drain placement, s/p anterior chest tube placement 4/30 by Dr. Kathlene Cote Patient with chest tubes in place.  Output decreasing from empyema. WBC improved to 11.6 PTX resolved on subsequent CXRs.  Anterior drain to seal. Posterior drain clamped to trial for possible removal per CCM.  Culture positive for abundant Klebsiella oxytoca. Remains on Unasyn and ancef.  IR following.   Electronically Signed: Docia Barrier, PA 06/05/2019, 10:39 AM   I spent a total of 15 Minutes at the the patient's bedside AND on the patient's hospital floor or unit, greater than 50% of which was counseling/coordinating care for empyema, pneumothorax.

## 2019-06-06 ENCOUNTER — Inpatient Hospital Stay (HOSPITAL_COMMUNITY): Payer: PPO

## 2019-06-06 LAB — CBC
HCT: 28.9 % — ABNORMAL LOW (ref 39.0–52.0)
Hemoglobin: 8.4 g/dL — ABNORMAL LOW (ref 13.0–17.0)
MCH: 30.2 pg (ref 26.0–34.0)
MCHC: 29.1 g/dL — ABNORMAL LOW (ref 30.0–36.0)
MCV: 104 fL — ABNORMAL HIGH (ref 80.0–100.0)
Platelets: 520 10*3/uL — ABNORMAL HIGH (ref 150–400)
RBC: 2.78 MIL/uL — ABNORMAL LOW (ref 4.22–5.81)
RDW: 16.5 % — ABNORMAL HIGH (ref 11.5–15.5)
WBC: 9.6 10*3/uL (ref 4.0–10.5)
nRBC: 0 % (ref 0.0–0.2)

## 2019-06-06 LAB — BASIC METABOLIC PANEL
Anion gap: 8 (ref 5–15)
BUN: 27 mg/dL — ABNORMAL HIGH (ref 6–20)
CO2: 29 mmol/L (ref 22–32)
Calcium: 8.4 mg/dL — ABNORMAL LOW (ref 8.9–10.3)
Chloride: 109 mmol/L (ref 98–111)
Creatinine, Ser: 0.52 mg/dL — ABNORMAL LOW (ref 0.61–1.24)
GFR calc Af Amer: >60 mL/min (ref >60)
GFR calc non Af Amer: >60 mL/min (ref >60)
Glucose, Bld: 132 mg/dL — ABNORMAL HIGH (ref 70–99)
Potassium: 4 mmol/L (ref 3.5–5.1)
Sodium: 146 mmol/L — ABNORMAL HIGH (ref 135–145)

## 2019-06-06 LAB — AEROBIC/ANAEROBIC CULTURE W GRAM STAIN (SURGICAL/DEEP WOUND)
Gram Stain: NONE SEEN
Special Requests: NORMAL

## 2019-06-06 LAB — GLUCOSE, CAPILLARY
Glucose-Capillary: 121 mg/dL — ABNORMAL HIGH (ref 70–99)
Glucose-Capillary: 118 mg/dL — ABNORMAL HIGH (ref 70–99)

## 2019-06-06 LAB — MAGNESIUM: Magnesium: 2.4 mg/dL (ref 1.7–2.4)

## 2019-06-06 LAB — PHOSPHORUS: Phosphorus: 3.4 mg/dL (ref 2.5–4.6)

## 2019-06-06 MED ORDER — DIPHENHYDRAMINE HCL 50 MG/ML IJ SOLN: 25.0000 mg | INTRAMUSCULAR | Status: DC | PRN

## 2019-06-06 MED ORDER — LORAZEPAM 2 MG/ML IJ SOLN: 2.0000 mg | INTRAMUSCULAR | Status: DC | PRN

## 2019-06-06 MED ORDER — SODIUM CHLORIDE 0.9 % IV SOLN
0.0000 mg/h | INTRAVENOUS | Status: DC
  Filled 2019-06-06 (×2): qty 20
  Filled 2019-06-06: qty 10

## 2019-06-06 MED ORDER — LORAZEPAM 2 MG/ML IJ SOLN
4.0000 mg | Freq: Once | INTRAMUSCULAR | Status: AC
  Administered 2019-06-06: 4 mg via INTRAVENOUS
  Filled 2019-06-06: qty 2

## 2019-06-06 MED ORDER — MORPHINE 100MG IN NS 100ML (1MG/ML) PREMIX INFUSION
0.0000 mg/h | INTRAVENOUS | Status: DC
  Administered 2019-06-06: 5 mg/h via INTRAVENOUS
  Filled 2019-06-06: qty 100

## 2019-06-06 MED ORDER — GLYCOPYRROLATE 1 MG PO TABS: 1.0000 mg | ORAL_TABLET | ORAL | Status: DC | PRN

## 2019-06-06 MED ORDER — LORAZEPAM 2 MG/ML IJ SOLN
2.0000 mg | INTRAMUSCULAR | Status: DC | PRN
  Administered 2019-06-06 (×2): 2 mg via INTRAVENOUS
  Filled 2019-06-06 (×2): qty 1

## 2019-06-06 MED ORDER — POLYVINYL ALCOHOL 1.4 % OP SOLN
1.0000 [drp] | Freq: Four times a day (QID) | OPHTHALMIC | Status: DC | PRN
  Filled 2019-06-06: qty 15

## 2019-06-06 MED ORDER — MORPHINE BOLUS VIA INFUSION
5.0000 mg | Freq: Once | INTRAVENOUS | Status: DC
  Filled 2019-06-06: qty 5

## 2019-06-06 MED ORDER — GLYCOPYRROLATE 0.2 MG/ML IJ SOLN: 0.2000 mg | INTRAMUSCULAR | Status: DC | PRN

## 2019-06-06 MED ORDER — MORPHINE BOLUS VIA INFUSION
5.0000 mg | INTRAVENOUS | Status: DC | PRN
  Administered 2019-06-06 (×4): 5 mg via INTRAVENOUS
  Filled 2019-06-06: qty 5

## 2019-06-06 MED ORDER — MORPHINE SULFATE (PF) 2 MG/ML IV SOLN
2.0000 mg | INTRAVENOUS | Status: DC | PRN
  Administered 2019-06-06: 2 mg via INTRAVENOUS
  Filled 2019-06-06: qty 1

## 2019-07-04 NOTE — Progress Notes (Signed)
NAME:  Matthew Eaton, MRN:  [id number], DOB:  1960-07-11, LOS: 5 ADMISSION DATE:  05/20/2019, CONSULTATION DATE:  05/11/2019 REFERRING MD:  [facility], CHIEF COMPLAINT:  hypoxemia   Brief History    59 year old withCovid pneumonia, worsened despite therapy.   Transferred to unit, intubated 05/11/2019.    History of present illness    Patient is a 59 year old male initially admitted 3/31 to 05/13/2019 for Covid pneumonia.  He signed out AMA the evening of 06/01/2019.  Then returned the evening of 4/5 complaining of worsening shortness of breath.  Presenting saturations at that time were in the 80s and he came up into the mid 90s on 100% oxygen.  We were asked to see early this morning in regards to worsening hypoxemia. The patient was noncompliant with his oxygen.  On high flow nasal cannula at 100% the patient had an oxygen saturation of about 79%. With the addition of 100% facemask the patient had oxygen saturation in the mid 80s.  He was verbal throughout and did not look particularly dyspneic.  Patient was urgently transferred to the ICU and intubated.  ventilated on 100% with an oxygen saturation between 85 and 95%. He completed his Decadron, remdesivir, and Actemra.  Chest x-ray performed showed worsening infiltrate particularly throughout the right lung.  Past Medical History   Past Medical History:  Diagnosis Date  . Chronic neck pain 05/20/2012  . Family hx of ALS (amyotrophic lateral sclerosis) 05/27/2013  . GERD (gastroesophageal reflux disease)   . Hiatal hernia   . HLD (hyperlipidemia)   . Major depressive disorder, recurrent episode, moderate ([place]) 05/27/2013  . Neck pain    cervical spine with herniation  . Tobacco abuse   . Wrist fracture, left    Significant Hospital Events    Transferred to ICU intubation 05/11/2019 4/11-proning 4/12-tolerating supine ventilation 4/13-tolerating PEEP wean. 4/18- trach to facilitate vent wean 4/22: ok to come off covid  precautions 4/22: Transferred out of 13M-> 38M  Consults:  [facility], PCCM  Procedures:  Intubation 05/11/2019 >> 4/18 CVL  05/11/2019  >> out 4/18 6 shiley trach >> R PICC 4/26  >> Urinary catheter 4/24 >> Right pleural catheter x 2 4/30 >> Cortrak NGT  Significant Diagnostic Tests:  4/6 ctpa:  1. No demonstrable pulmonary embolus. No thoracic aortic aneurysm. There are foci of aortic atherosclerosis. 2. Widespread ground-glass type opacity consistent with extensive multifocal pneumonia, slightly increased in the right middle lobe and stable albeit extensive elsewhere. Overall there is more infiltrate on the left than on the right, stable. Underlying degree of emphysema. 3.  No evident adenopathy. 4.  Stable moderate hiatal hernia. 5.  No evident adenopathy.  4/7 echo: LVEF [id number], grade I diastolic dysfunc 4/7 LE venous duplex: negative bilaterally for DVT  4/28 CT Chest >> 1. Progressive changes related to the known COVID-19 infection although some new superimposed infiltrates are noted primarily within the lower lobes and right upper lobe as described. 2. New complicated effusion on the right consistent with empyema. Percutaneous drainage may be helpful. 3. Aortic Atherosclerosis (ICD10-I70.0) and Emphysema (ICD10-J43.9).  Micro Data:  4/8 Respiratory cultures-abundant MSSA, Haemophilus influenzae Covid PCR positive 3/31 4/18 resp: MSSA 4/23: Blood culutres >> neg 4/23: Respiratory cultures >> Klebsiella and Staph  4/23: Urine cultures >> NGTD 4/30: left pleural fluid>>Klebsiella  Antimicrobials:  Cefepime 4/7-4/10 Ampicillin 4/10-4/11 Augmentin 4/12-4/16 Remdesivir-completed vanc 4/19->4/20 Cefazolin 4/20 >> 4/23; 5/2 >> Zosyn 4/23 >> 4/26 Cefazolin 4/26 > 4/29 Unasyn 4/30>>  Interim history/subjective:  Family ready to transition to comfort care   Objective   Blood pressure 112/67, pulse 84, temperature 99.3 F (37.4 C), resp. rate (!) 27,  height 5\' 10"  (1.778 m), weight 95.1 kg, SpO2 97 %.    Vent Mode: PRVC FiO2 (%):  [40 %] 40 % Set Rate:  [24 bmp] 24 bmp Vt Set:  [550 mL] 550 mL PEEP:  [8 cmH20] 8 cmH20 Plateau Pressure:  [19 cmH20-22 cmH20] 19 cmH20    Intake/Output Summary (Last 24 hours) at 06-25-19 0842 Last data filed at Jun 25, 2019 [id number] Gross per 24 hour  Intake 3435.34 ml  Output 1660 ml  Net 1775.34 ml   Filed Weights   06/04/19 0500 06/05/19 0500 06-25-2019 0300  Weight: 100.6 kg 95.6 kg 95.1 kg   Examination:  General:  Chronically ill elderly male in NAD on fentanyl gtt HEENT: midline trach, left cortrak Neuro: Not following commands, occasionally moves upper extremities  CV:rr PULM:  Comfortable on MV, coarse throughout, two right pigtail chest tubes  GI: soft, NT/ ND, foley   Extremities: warm/dry, no LE edema  Skin: no rashes   Resolved Hospital Problem list     Assessment & Plan:   Acute hypoxemic respiratory failure  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  Patient has very poor prognosis and wife confirms patient would not want to live without any meaningful quality of life.  Family is currently at bedside and ready to transition to full comfort care.   Will change to morphine gtt and once comfortable, d/c ventilator support.  He does have some respiratory drive, so it may take some time, but anticipate hospital passing.  PRN ativan, robinul as needed Ongoing emotional support provided  CCT 35 mins  Kennieth Rad, MSN, AGACNP-BC [facility] 06/25/2019, 8:42 AM

## 2019-07-04 NOTE — Progress Notes (Signed)
Patient asystole on monitor and no heart sounds auscultated by myself and Control and instrumentation engineer. Time of death noted 1333. Family at bedside

## 2019-07-04 NOTE — Progress Notes (Signed)
Comfort Care of patient started per orders and family's wishes. Morphine infusion started with family at the bedside I offered for hospital chaplain to come but family does not want that at this time

## 2019-07-04 NOTE — Death Summary Note (Signed)
DEATH SUMMARY   Patient Details  Name: Matthew Eaton MRN: [id number] DOB: 1961/01/12  Admission/Discharge Information   Admit Date:  May 28, 2019  Date of Death: Date of Death: 26-Jun-2019  Time of Death: Time of Death: May 16, 1331  Length of Stay: 21-May-2022  Referring Physician: Owens Loffler, MD   Reason(s) for Hospitalization  Respiratory failure  Diagnoses  Preliminary cause of death: covid 5 pneumonia Secondary Diagnoses (including complications and co-morbidities):  Principal Problem:   Acute hypoxemic respiratory failure due to COVID-19 [facility]) Active Problems:   Hyperlipidemia   Generalized anxiety disorder   Acute respiratory failure ([place])   ARF (acute renal failure) ([place])   Acute respiratory distress syndrome (ARDS) due to COVID-19 virus ([facility])   HAP (hospital-acquired pneumonia)   Acute encephalopathy   Pressure injury of skin   Brief Hospital Course (including significant findings, care, treatment, and services provided and events leading to death)  Matthew Eaton is a 59 y.o. year old male who was initially admitted 3/31to04-25-21 for Covid pneumonia. He signed out AMA the evening of 2019/05/28. Then returned the evening of 4/5complaining of worsening shortness of breath. Presenting saturations at that time were in the 80s and he came up into the mid 90s on 100% oxygen. On the morning of 4/8 he developed worsening hypoxemia was urgently transferred to the ICU and intubated.  He completed his Decadron, remdesivir, and Actemra. Chest x-ray performed showed worsening infiltrate particularly throughout the right lung.  He underwent tracheostomy on 4/18 for failure to liberate from the ventilator. 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 He developed subsequent debility and encephalopathy. [facility] was contacted for consideration of lung transplantation and he was  declined due to his debility. Given his failure to improve, family elected to transition him to comfort measures. He expired on 2019/06/26 at 13:33. Family, his wife was as bedside.   Pertinent Labs and Studies  Significant Diagnostic Studies DG Chest 1 View  Result Date: 05/11/2019 CLINICAL DATA:  Central line placement. COVID positive on 05/02/2019. EXAM: CHEST  1 VIEW COMPARISON:  05/11/2019 at 4:27 a.m. FINDINGS: New right internal jugular central venous line has its tip in the lower superior vena cava. No pneumothorax. Endotracheal tube tip projects 4.3 cm above the carina, higher than on the earlier exam. Nasal/orogastric tube tip passes a few cm through the GE junction. No change in extensive bilateral, left greater than right airspace lung opacities consistent with multifocal pneumonia. IMPRESSION: 1. Well-positioned right internal jugular central venous line. No pneumothorax. 2. Endotracheal tube tip now projects 4 point 3 cm above the carina. 3. No other change from the earlier study. Electronically Signed   By: Lajean Manes M.D.   On: 05/11/2019 09:56   DG Abd 1 View  Result Date: 05/27/2019 CLINICAL DATA:  59 year old male with feeding tube placement. EXAM: ABDOMEN - 1 VIEW COMPARISON:  Abdominal radiograph dated 05/12/2019. FINDINGS: Partially visualized feeding tube with weighted tip in the right hemiabdomen adjacent to the L3, likely in the region of the second and third portion of the duodenum. There is no bowel dilatation. The osseous structures and soft tissues are unremarkable. Diffuse bilateral pulmonary densities and right pleural effusion noted. IMPRESSION: Feeding tube with tip likely in the duodenum. Electronically Signed   By: Anner Crete M.D.   On: 05/27/2019 16:46   DG Abd 1 View  Result Date: 05/12/2019 CLINICAL DATA:  Orogastric tube placement. EXAM:  ABDOMEN - 1 VIEW COMPARISON:  Same day. FINDINGS: The bowel gas pattern is normal. Distal tip of enteric tube is seen  looped within the distal esophagus. No radio-opaque calculi or other significant radiographic abnormality are seen. IMPRESSION: Distal tip of enteric tube is seen looped within the distal esophagus. No evidence of bowel obstruction or ileus. Electronically Signed   By: Marijo Conception M.D.   On: 05/12/2019 10:47   DG Abd 1 View  Result Date: 05/12/2019 CLINICAL DATA:  Orogastric tube placement. EXAM: ABDOMEN - 1 VIEW COMPARISON:  One-view abdomen 05/12/2019 at 3:19 a.m. FINDINGS: Bowel gas pattern is normal. Side port of the NG tube is just above the GE junction, unchanged. Bilateral airspace disease is present, left greater than right. IMPRESSION: 1. Side port of the NG tube is just above the GE junction. 2. Stable bilateral airspace disease, left greater than right. Electronically Signed   By: San Morelle M.D.   On: 05/12/2019 08:00   DG Abd 1 View  Result Date: 05/12/2019 CLINICAL DATA:  Orogastric tube placement. EXAM: ABDOMEN - 1 VIEW COMPARISON:  May 11, 2019 FINDINGS: The visualized portion of the lungs is remarkable for moderate to marked severity bilateral infiltrates, left greater than right. A nasogastric tube is seen with its distal tip approximately 5.0 cm distal to the gastroesophageal junction. The bowel gas pattern is normal. No radio-opaque calculi or other significant radiographic abnormality are seen. IMPRESSION: 1. Nasogastric tube positioning, as described above. Further advancement of the NG tube by approximately 7 cm is recommended to decrease the risk of aspiration. 2. Moderate to marked severity bilateral infiltrates, left greater than right. Electronically Signed   By: Virgina Norfolk M.D.   On: 05/12/2019 03:56   CT CHEST WO CONTRAST  Result Date: 05/31/2019 CLINICAL DATA:  Hypoxemia EXAM: CT CHEST WITHOUT CONTRAST TECHNIQUE: Multidetector CT imaging of the chest was performed following the standard protocol without IV contrast. COMPARISON:  Chest x-ray from the  previous day, CT from 05/09/2019 FINDINGS: Cardiovascular: Limited due to lack of IV contrast. Aortic calcifications are noted without aneurysmal dilatation. No cardiac enlargement is seen. Pulmonary artery is of normal caliber. Mediastinum/Nodes: Thoracic inlet is within normal limits. Tracheostomy tube and gastric catheter are noted in satisfactory position. Scattered small mediastinal lymph nodes are noted. Evaluation for hilar adenopathy is somewhat limited due to the lack of IV contrast. The esophagus appears within normal limits. Lungs/Pleura: Chronic emphysematous changes are again identified with evidence of diffuse resolving infiltrates and underlying mild fibrosis. Some reticulonodular appearance is noted as well consistent with the progressive COVID-19 infection. New consolidation in the posterior aspect of the right upper lobe and right lower lobes is noted. This is also seen in the left lower lobe to a lesser degree. This is new from the prior exam. Increase in pleural fluid on the right is noted with air within likely representing an empyema. Percutaneous drainage may be helpful. Upper Abdomen: Visualized upper abdomen is within normal limits. Musculoskeletal: No acute bony abnormality is noted. IMPRESSION: Progressive changes related to the known COVID-19 infection although some new superimposed infiltrates are noted primarily within the lower lobes and right upper lobe as described. New complicated effusion on the right consistent with empyema. Percutaneous drainage may be helpful. Aortic Atherosclerosis (ICD10-I70.0) and Emphysema (ICD10-J43.9). Electronically Signed   By: Inez Catalina M.D.   On: 05/31/2019 17:43   CT ANGIO CHEST PE W OR WO CONTRAST  Result Date: 05/09/2019 CLINICAL DATA:  Shortness of breath.  COVID-19 positive EXAM: CT ANGIOGRAPHY CHEST WITH CONTRAST TECHNIQUE: Multidetector CT imaging of the chest was performed using the standard protocol during bolus administration of  intravenous contrast. Multiplanar CT image reconstructions and MIPs were obtained to evaluate the vascular anatomy. CONTRAST:  70 mL OMNIPAQUE IOHEXOL 350 MG/ML SOLN COMPARISON:  May 05, 2019 CT angiogram chest; chest radiograph May 08, 2019 FINDINGS: Cardiovascular: There is no demonstrable pulmonary embolus. There is no thoracic aortic aneurysm or dissection. There are scattered foci in visualized great vessels. There are scattered foci of aortic atherosclerosis. There is no pericardial effusion or pericardial thickening. Mediastinum/Nodes: Thyroid appears unremarkable. There is no evident thoracic adenopathy. There is a focal moderate hiatal hernia, stable. Lungs/Pleura: There is underlying centrilobular emphysematous change. There is widespread ground-glass type opacity throughout the lungs fairly diffusely with overall increased airspace opacity in the right middle lobe compared to recent CT with similar changes elsewhere. Note that there is extensive consolidation throughout most of the left lung, stable. No frank consolidation evident. No pleural effusions are appreciable. Upper Abdomen: Visualized upper abdominal structures appear unremarkable. Musculoskeletal: Postoperative changes noted in the lower cervical spine. There are no blastic or lytic bone lesions. There are no evident chest wall lesions. Review of the MIP images confirms the above findings. IMPRESSION: 1. No demonstrable pulmonary embolus. No thoracic aortic aneurysm. There are foci of aortic atherosclerosis. 2. Widespread ground-glass type opacity consistent with extensive multifocal pneumonia, slightly increased in the right middle lobe and stable albeit extensive elsewhere. Overall there is more infiltrate on the left than on the right, stable. Underlying degree of emphysema. 3.  No evident adenopathy. 4.  Stable moderate hiatal hernia. 5.  No evident adenopathy. Aortic Atherosclerosis (ICD10-I70.0) and Emphysema (ICD10-J43.9).  Electronically Signed   By: Lowella Grip III M.D.   On: 05/09/2019 14:13   DG Chest Port 1 View  Result Date: 2019-06-27 CLINICAL DATA:  Right empyema EXAM: PORTABLE CHEST 1 VIEW COMPARISON:  Radiograph 06/04/2019, CT 05/31/2019. FINDINGS: *Tracheostomy tube in the mid to upper trachea, 6 cm from the carina. Transesophageal tube tip beyond the GE junction, below the level of imaging. *Pigtail pleural drains are again seen in the right lung base. The tube exiting the chest wall more inferiorly appears to been slightly retracted accounting for slight rotation. *Right upper extremity PICC tip terminates in the mid SVC. *Telemetry leads overlie the chest. *Prior cervical fusion hardware. Patchy mixed bilateral interstitial airspace opacities throughout both lungs more focal opacity in the right lung base compatible with a complex loculated empyema seen on comparison imaging. Appearance of the lungs is not significantly changed accounting for differences in technique. Cardiomediastinal contours are stable. No visible pneumothorax or left effusion. No acute osseous or soft tissue abnormality. IMPRESSION: 1. No significant interval change in the appearance of the chest accounting for differences in technique. 2. Pigtail pleural drains again seen in the right lung base in the region of the complex empyema seen on comparison exams. Tube exiting more inferiorly appears to have been slightly retracted. 3. Additional support devices as above. Electronically Signed   By: Lovena Le M.D.   On: 27-Jun-2019 05:04   DG Chest Port 1 View  Result Date: 06/04/2019 CLINICAL DATA:  Chest tube.  COVID-19 positive. EXAM: PORTABLE CHEST 1 VIEW COMPARISON:  06/03/2019 FINDINGS: Tracheostomy tube unchanged. Enteric tube courses into the stomach and off the film as tip is not visualized. 2 pigtail right basilar pleural drainage catheters are present. Right-sided PICC line unchanged. Lungs are adequately  inflated demonstrate stable  patchy bilateral moderate airspace process compatible with known multifocal pneumonia. Tiny amount right pleural fluid. No pneumothorax. Cardiomediastinal silhouette and remainder of the exam is unchanged. IMPRESSION: 1. Stable bilateral patchy moderate airspace process compatible with known multifocal pneumonia. Tiny amount right pleural fluid. 2.  Tubes and lines as described. Electronically Signed   By: Marin Olp M.D.   On: 06/04/2019 08:30   DG Chest Port 1 View  Result Date: 06/03/2019 CLINICAL DATA:  Acute respiratory failure EXAM: PORTABLE CHEST 1 VIEW COMPARISON:  06/02/2019 FINDINGS: Unchanged position of tracheostomy 2. Enteric tube courses below the field of view. Pigtail catheters at the right lung base are also unchanged. Unchanged appearance of loculated right pleural effusion along the lateral aspect of the pleura. No visible pneumothorax. Opacities throughout both lungs are unchanged. IMPRESSION: 1. Unchanged appearance of loculated right pleural effusion with pigtail catheters in the right lung base. 2. Unchanged appearance of bilateral lung opacities. Electronically Signed   By: Ulyses Jarred M.D.   On: 06/03/2019 04:58   DG CHEST PORT 1 VIEW  Result Date: 06/02/2019 CLINICAL DATA:  Chest tube EXAM: PORTABLE CHEST 1 VIEW COMPARISON:  Chest x-rays dated 05/10/2019 and 05/27/2019. FINDINGS: Tracheostomy tube appears appropriately positioned in the midline. Enteric tube passes below the diaphragm. 2 pigtail chest tubes are present at the RIGHT lung base. No pneumothorax is seen. Suspected small loculated pleural effusion along the RIGHT lateral lung, stable. Diffuse reticular opacities are again seen throughout the LEFT lung. Additional patchy airspace opacities and scarring/atelectasis within the RIGHT lung, also stable. IMPRESSION: 1. New pigtail catheters at the RIGHT lung base. 2. Stable bilateral pneumonia. Electronically Signed   By: Franki Cabot M.D.   On: 06/02/2019 10:49   DG  CHEST PORT 1 VIEW  Result Date: 05/30/2019 CLINICAL DATA:  Acute hypoxic respiratory failure.  COVID. EXAM: PORTABLE CHEST 1 VIEW COMPARISON:  Three days ago FINDINGS: Tracheostomy tube in place. Feeding tube at least reaches the stomach. Bilateral reticular consolidative opacities with loculated fluid or extrapleural thickening on the right. Normal heart size and stable mediastinal contours. IMPRESSION: Bilateral pneumonia that is stable from 3 days ago and progressed from more remote imaging. There is extrapleural thickening or loculated fluid along the lateral right chest which is not commonly seen with COVID pneumonia, possible superinfection Electronically Signed   By: Monte Fantasia M.D.   On: 05/30/2019 11:44   DG CHEST PORT 1 VIEW  Result Date: 05/27/2019 CLINICAL DATA:  Acute respiratory failure EXAM: PORTABLE CHEST 1 VIEW COMPARISON:  May 26, 2019 FINDINGS: The feeding tube terminates below today's film. The tracheostomy tube is again identified. No pneumothorax. Diffuse bilateral pulmonary opacities are stable. The cardiomediastinal silhouette is stable. IMPRESSION: Stable diffuse bilateral pulmonary infiltrates. The feeding tube terminates below today's study. Electronically Signed   By: Dorise Bullion III M.D   On: 05/27/2019 11:44   DG CHEST PORT 1 VIEW  Result Date: 05/26/2019 CLINICAL DATA:  Fever.  Pneumonia. EXAM: PORTABLE CHEST 1 VIEW COMPARISON:  05/24/2019. FINDINGS: Feeding tube noted with tip below left hemidiaphragm. Heart size normal. Diffuse bilateral pulmonary infiltrates/edema. Right base atelectasis. Similar findings noted on prior exam. Small right pleural effusion noted on today's exam. No pneumothorax. Cervical spine fusion. IMPRESSION: Diffuse bilateral pulmonary infiltrates/edema. Similar findings noted on prior exam. Small right pleural effusion noted on today's exam. Electronically Signed   By: [place]   On: 05/26/2019 11:47   DG CHEST PORT 1  VIEW  Result Date: 05/24/2019 CLINICAL DATA:  COVID-19 pneumonia. EXAM: PORTABLE CHEST 1 VIEW COMPARISON:  One-view chest x-ray 05/23/2019 FINDINGS: Tracheostomy tube is stable. Feeding tube courses off the inferior border the film. Interstitial and airspace disease is increased on the left. Findings are similar on the right. Lung volumes are low. IMPRESSION: Persistent interstitial and airspace disease compatible with COVID pneumonia. Airspace disease is increasing on the left. Electronically Signed   By: San Morelle M.D.   On: 05/24/2019 06:50   DG CHEST PORT 1 VIEW  Result Date: 05/23/2019 CLINICAL DATA:  Follow-up pneumonia. COVID-19 positive. EXAM: PORTABLE CHEST 1 VIEW COMPARISON:  05/22/2019 FINDINGS: Tracheostomy tube in satisfactory position. Feeding tube extending into the proximal duodenum, tip not included. Mild increase in patchy opacity in the right mid lung zone. Mildly improved patchy opacities elsewhere in both lungs. Stable diffuse prominence of the interstitial markings. No pleural fluid. Mild thoracic spine degenerative changes. Cervical spine fixation hardware. IMPRESSION: 1. Improved bilateral pneumonia with exception of mildly increased pneumonia in the right mid lung zone. 2. Stable interstitial lung disease. Electronically Signed   By: Claudie Revering M.D.   On: 05/23/2019 13:22   DG Chest Port 1 View  Result Date: 05/22/2019 CLINICAL DATA:  COVID-19. EXAM: PORTABLE CHEST 1 VIEW COMPARISON:  May 20, 2019. FINDINGS: The heart size and mediastinal contours are within normal limits. Tracheostomy and feeding tubes are unchanged in position. No pneumothorax or pleural effusion is noted. Stable bilateral lung opacities are noted concerning for multifocal pneumonia. The visualized skeletal structures are unremarkable. IMPRESSION: Stable support apparatus. Stable bilateral lung opacities are noted concerning for multifocal pneumonia. Electronically Signed   By: Marijo Conception M.D.    On: 05/22/2019 08:08   DG Chest Port 1 View  Result Date: 05/20/2019 CLINICAL DATA:  59 year old male with tracheostomy EXAM: PORTABLE CHEST 1 VIEW COMPARISON:  05/18/2019 FINDINGS: Cardiomediastinal silhouette unchanged in size and contour. Right rotation of the patient somewhat limits evaluation, however, there are persistent reticulonodular opacities of the bilateral, left greater than right lungs. Interval placement of tracheostomy and removal of the endotracheal tube. Unchanged enteric feeding tube traversing the mediastinum. No pneumothorax or large pleural effusion. Surgical changes of the cervical region IMPRESSION: Interval placement of tracheostomy with removal of the endotracheal tube. Similar appearance of bilateral reticulonodular opacities, worst on the left. Unchanged enteric tube. Electronically Signed   By: Corrie Mckusick D.O.   On: 05/20/2019 10:59   DG CHEST PORT 1 VIEW  Result Date: 05/18/2019 CLINICAL DATA:  COVID-19 EXAM: PORTABLE CHEST 1 VIEW COMPARISON:  Portable exam 0804 hours compared to 05/17/2019 FINDINGS: Tip of endotracheal tube projects 3.1 cm above carina. Feeding tube extends into stomach. Normal heart size and mediastinal contours. BILATERAL pulmonary infiltrates asymmetrically greater on LEFT consistent with multifocal pneumonia. No pleural effusion or pneumothorax. Bones demineralized with evidence of prior cervical spine fusion. IMPRESSION: Persistent BILATERAL pulmonary infiltrates consistent with multifocal pneumonia. When compared to the previous exam, slight improvement in aeration in RIGHT lung. Electronically Signed   By: Lavonia Dana M.D.   On: 05/18/2019 08:10   DG CHEST PORT 1 VIEW  Result Date: 05/17/2019 CLINICAL DATA:  59 year old male on assisted mechanical ventilation. EXAM: PORTABLE CHEST 1 VIEW COMPARISON:  Chest x-ray 05/15/2019. FINDINGS: An endotracheal tube is in place with tip 3.9 cm above the carina. There is a right-sided internal jugular  central venous catheter with tip terminating in the superior cavoatrial junction. A feeding tube is seen extending into the  abdomen, however, the tip of the feeding tube extends below the lower margin of the image. Lung volumes are slightly low. Widespread patchy interstitial and ill-defined airspace disease noted throughout the lungs bilaterally, with slightly improved aeration compared to the prior examination. No pleural effusions. No evidence of pulmonary edema. Heart size is normal. Upper mediastinal contours are within normal limits. IMPRESSION: 1. Support apparatus, as above. 2. Slight improving aeration with persistent evidence of severe multilobar bilateral pneumonia, as above. Electronically Signed   By: Vinnie Langton M.D.   On: 05/17/2019 07:04   DG Chest Port 1 View  Result Date: 05/15/2019 CLINICAL DATA:  59 year old male with history of respiratory failure. EXAM: PORTABLE CHEST 1 VIEW COMPARISON:  Chest x-ray 05/14/2019. FINDINGS: An endotracheal tube is in place with tip 3.8 cm above the carina. There is a right-sided internal jugular central venous catheter with tip terminating in the superior cavoatrial junction. Patchy multifocal airspace consolidation throughout the lungs bilaterally, left greater than right, with slightly improved aeration compared to the prior examination. No pleural effusions. No evidence of pulmonary edema. Heart size is normal. IMPRESSION: 1. Support apparatus, as above. 2. Severe multilobar bilateral pneumonia redemonstrated with slightly improved aeration compared to the prior examination, as above. Electronically Signed   By: Vinnie Langton M.D.   On: 05/15/2019 07:24   DG Chest Port 1 View  Result Date: 05/14/2019 CLINICAL DATA:  Evaluate ETT. EXAM: PORTABLE CHEST 1 VIEW COMPARISON:  April levin 2021 FINDINGS: The right central line is in good position. The ETT is in good position. The feeding tube terminates below today's film. Persistent peripheral right  pulmonary infiltrate. Overall, the infiltrate on the right may be mildly improved. Diffuse pulmonary infiltrate on the left is more prominent the interval. No pneumothorax. No other interval changes. IMPRESSION: 1. Support apparatus as above. Specifically, the ETT is in good position. 2. Worsening diffuse infiltrate on the left. Mild improvement of right-sided infiltrate. Electronically Signed   By: Dorise Bullion III M.D   On: 05/14/2019 14:26   DG Chest Port 1 View  Result Date: 05/14/2019 CLINICAL DATA:  Respiratory failure and COVID-19 positive patient. EXAM: PORTABLE CHEST 1 VIEW COMPARISON:  05/13/2019 FINDINGS: Enteric tube courses into the stomach and off the film as tip is not visualized. Endotracheal tube and right IJ central venous catheter is unchanged. Lungs are hypoinflated demonstrate bilateral patchy airspace process without significant change compatible patient's known COVID-19 pneumonia. No effusion. Cardiomediastinal silhouette and remainder of the exam is unchanged. IMPRESSION: Stable multifocal bilateral airspace process compatible with known COVID-19 pneumonia. Tubes and lines as described. Electronically Signed   By: Marin Olp M.D.   On: 05/14/2019 08:06   DG Chest Port 1 View  Result Date: 05/13/2019 CLINICAL DATA:  Respiratory failure. EXAM: PORTABLE CHEST 1 VIEW COMPARISON:  05/12/2019 FINDINGS: Endotracheal tube with tip 5.9 cm above the carina. Enteric tube courses into the stomach and off the film as tip is not visualized. Right IJ central venous catheter unchanged with tip over the SVC. Lungs are adequately inflated demonstrate stable moderate bilateral patchy airspace process likely multifocal pneumonia. No effusion. Cardiomediastinal silhouette and remainder the exam is unchanged. IMPRESSION: Stable moderate bilateral multifocal airspace process likely multifocal pneumonia. Tubes and lines as described. Electronically Signed   By: Marin Olp M.D.   On: 05/13/2019 09:05    DG Chest Port 1 View  Result Date: 05/12/2019 CLINICAL DATA:  Respiratory failure. EXAM: PORTABLE CHEST 1 VIEW COMPARISON:  May 11, 2019 FINDINGS:  There is stable endotracheal tube, nasogastric tube and right internal jugular venous catheter positioning. Persistent moderate to marked severity bilateral infiltrates are seen, left greater than right. There is no evidence of a pleural effusion or pneumothorax. The heart size and mediastinal contours are within normal limits. The visualized skeletal structures are unremarkable. IMPRESSION: Persistent moderate to marked severity bilateral infiltrates, left greater than right. Electronically Signed   By: Virgina Norfolk M.D.   On: 05/12/2019 03:59   Portable Chest x-ray  Result Date: 05/11/2019 CLINICAL DATA:  Intubation EXAM: PORTABLE CHEST 1 VIEW COMPARISON:  Earlier today FINDINGS: New endotracheal tube with tip 18 mm above the carina. The orogastric tube tip and side-port reaches the stomach. Bilateral pulmonary infiltrate with worsening aeration on the left. Normal heart size and mediastinal contours. No visible effusion or pneumothorax. IMPRESSION: 1. New hardware in place. The endotracheal tube tip is 18 mm above the carina. 2. COVID associated pneumonia with rapid worsening on the left, question aspiration Electronically Signed   By: Monte Fantasia M.D.   On: 05/11/2019 04:47   DG CHEST PORT 1 VIEW  Result Date: 05/11/2019 CLINICAL DATA:  Acute respiratory failure with hypoxia EXAM: PORTABLE CHEST 1 VIEW COMPARISON:  Three days ago FINDINGS: Patchy bilateral pneumonia with mild progression. Normal heart size and stable mediastinal contours. No visible effusion or pneumothorax. IMPRESSION: Bilateral pneumonia with mild worsening from 3 days ago. Electronically Signed   By: Monte Fantasia M.D.   On: 05/11/2019 04:10   DG Chest Port 1 View  Result Date: 05/09/2019 CLINICAL DATA:  Shortness of breath. COVID pneumonia. EXAM: PORTABLE CHEST 1 VIEW  COMPARISON:  Chest x-ray 05/05/2019 FINDINGS: The cardiac silhouette, mediastinal and hilar contours are stable. Persistent patchy ill-defined interstitial and airspace infiltrates in both lungs, left worse than right. No definite pleural effusions. IMPRESSION: Persistent bilateral infiltrates, left worse than right. Electronically Signed   By: Marijo Sanes M.D.   On: 05/20/2019 08:06   DG Abd Portable 1V  Result Date: 05/11/2019 CLINICAL DATA:  Esophageal intubation EXAM: PORTABLE ABDOMEN - 1 VIEW COMPARISON:  None. FINDINGS: The orogastric tube tip and side-port overlaps the stomach. Nonobstructive bowel gas pattern with no concerning mass effect or gas collection IMPRESSION: Orogastric tube in good position. Electronically Signed   By: Monte Fantasia M.D.   On: 05/11/2019 04:48   CT IMAGE GUIDED DRAINAGE BY PERCUTANEOUS CATHETER  Result Date: 06/02/2019 INDICATION: Respiratory failure, recent COVID pneumonia and right empyema. EXAM: 1. CT-GUIDED PLACEMENT OF RIGHT THORACOSTOMY TUBE PLACEMENT FOR PNEUMOTHORAX 2. CT-GUIDED PLACEMENT OF RIGHT THORACOSTOMY TUBE PLACEMENT FOR DRAINAGE OF EMPYEMA MEDICATIONS: The patient is currently admitted to the hospital and receiving intravenous antibiotics. The antibiotics were administered within an appropriate time frame prior to the initiation of the procedure. ANESTHESIA/SEDATION: The patient has a tracheostomy tube and was on a ventilator during the procedure. He was not formally given moderate conscious sedation. COMPLICATIONS: None immediate. PROCEDURE: Informed written consent was obtained from the patient's wife after a thorough discussion of the procedural risks, benefits and alternatives. All questions were addressed. Maximal Sterile Barrier Technique was utilized including caps, mask, sterile gowns, sterile gloves, sterile drape, hand hygiene and skin antiseptic. A timeout was performed prior to the initiation of the procedure. CT was performed with the right  side rolled up slightly. Under CT guidance, 18 gauge trocar needles were separately advanced into the anterior right lower pleural space and the posterior right lower pleural space. After confirming needle tip position, guidewires were advanced,  the tracts dilated and separate 12 French pigtail drainage catheters advanced. Catheter position was confirmed by CT. Both catheters were secured at the skin with Prolene retention sutures and StatLock devices. Both drainage catheters were connected to [place]. A sample of fluid from the posterior right pleural collection was sent for culture analysis. FINDINGS: Initial imaging shows a new right anterior pneumothorax of approximately 20% volume which was not present on the prior CT of the chest on 05/31/2019. Loculated posterior pleural collection containing fluid and air appears stable. A 12 French pigtail catheter was placed anteriorly to treat the pneumothorax. Catheter positioning is appropriate in the anterior pleural space. A second 12 French catheter was placed in the posterior pleural empyema at yielding grossly purulent appearing fluid. A sample was sent for culture analysis. IMPRESSION: 1. New roughly 20% right anterior pneumothorax which was treated with placement of a 12 French pigtail thoracostomy tube. This was attached to a Pleur-evac device and will be connected to wall suction at -20 cm of water. 2. 12 French drainage catheter placed in the posterior right pleural empyema yielding grossly purulent fluid. A sample was sent for culture analysis. The drain was attached to a Pleur-evac device and will be connected to wall suction at -20 cm of water. Electronically Signed   By: Aletta Edouard M.D.   On: 06/02/2019 16:13   CT [facility] PLEURAL DRAIN W/INDWELL CATH W/IMG GUIDE  Result Date: 06/02/2019 INDICATION: Respiratory failure, recent COVID pneumonia and right empyema. EXAM: 1. CT-GUIDED PLACEMENT OF RIGHT THORACOSTOMY TUBE PLACEMENT FOR  PNEUMOTHORAX 2. CT-GUIDED PLACEMENT OF RIGHT THORACOSTOMY TUBE PLACEMENT FOR DRAINAGE OF EMPYEMA MEDICATIONS: The patient is currently admitted to the hospital and receiving intravenous antibiotics. The antibiotics were administered within an appropriate time frame prior to the initiation of the procedure. ANESTHESIA/SEDATION: The patient has a tracheostomy tube and was on a ventilator during the procedure. He was not formally given moderate conscious sedation. COMPLICATIONS: None immediate. PROCEDURE: Informed written consent was obtained from the patient's wife after a thorough discussion of the procedural risks, benefits and alternatives. All questions were addressed. Maximal Sterile Barrier Technique was utilized including caps, mask, sterile gowns, sterile gloves, sterile drape, hand hygiene and skin antiseptic. A timeout was performed prior to the initiation of the procedure. CT was performed with the right side rolled up slightly. Under CT guidance, 18 gauge trocar needles were separately advanced into the anterior right lower pleural space and the posterior right lower pleural space. After confirming needle tip position, guidewires were advanced, the tracts dilated and separate 12 French pigtail drainage catheters advanced. Catheter position was confirmed by CT. Both catheters were secured at the skin with Prolene retention sutures and StatLock devices. Both drainage catheters were connected to [place]. A sample of fluid from the posterior right pleural collection was sent for culture analysis. FINDINGS: Initial imaging shows a new right anterior pneumothorax of approximately 20% volume which was not present on the prior CT of the chest on 05/31/2019. Loculated posterior pleural collection containing fluid and air appears stable. A 12 French pigtail catheter was placed anteriorly to treat the pneumothorax. Catheter positioning is appropriate in the anterior pleural space. A second 12 French  catheter was placed in the posterior pleural empyema at yielding grossly purulent appearing fluid. A sample was sent for culture analysis. IMPRESSION: 1. New roughly 20% right anterior pneumothorax which was treated with placement of a 12 French pigtail thoracostomy tube. This was attached to a Pleur-evac device and will  be connected to wall suction at -20 cm of water. 2. 12 French drainage catheter placed in the posterior right pleural empyema yielding grossly purulent fluid. A sample was sent for culture analysis. The drain was attached to a Pleur-evac device and will be connected to wall suction at -20 cm of water. Electronically Signed   By: Aletta Edouard M.D.   On: 06/02/2019 16:13   VAS [place] LOWER EXTREMITY VENOUS (DVT)  Result Date: 05/10/2019  Lower Venous DVTStudy Indications: Swelling.  Comparison Study: no prior Performing Technologist: June Leap RDMS, RVT  Examination Guidelines: A complete evaluation includes B-mode imaging, spectral Doppler, color Doppler, and power Doppler as needed of all accessible portions of each vessel. Bilateral testing is considered an integral part of a complete examination. Limited examinations for reoccurring indications may be performed as noted. The reflux portion of the exam is performed with the patient in reverse Trendelenburg.  +---------+---------------+---------+-----------+----------+--------------+ RIGHT    CompressibilityPhasicitySpontaneityPropertiesThrombus Aging +---------+---------------+---------+-----------+----------+--------------+ CFV      Full           Yes      Yes                                 +---------+---------------+---------+-----------+----------+--------------+ SFJ      Full                                                        +---------+---------------+---------+-----------+----------+--------------+ FV Prox  Full                                                         +---------+---------------+---------+-----------+----------+--------------+ FV Mid   Full                                                        +---------+---------------+---------+-----------+----------+--------------+ FV DistalFull                                                        +---------+---------------+---------+-----------+----------+--------------+ PFV      Full                                                        +---------+---------------+---------+-----------+----------+--------------+ POP      Full           Yes      Yes                                 +---------+---------------+---------+-----------+----------+--------------+ PTV      Full                                                        +---------+---------------+---------+-----------+----------+--------------+  PERO     Full                                                        +---------+---------------+---------+-----------+----------+--------------+   +---------+---------------+---------+-----------+----------+--------------+ LEFT     CompressibilityPhasicitySpontaneityPropertiesThrombus Aging +---------+---------------+---------+-----------+----------+--------------+ CFV      Full           Yes      Yes                                 +---------+---------------+---------+-----------+----------+--------------+ SFJ      Full                                                        +---------+---------------+---------+-----------+----------+--------------+ FV Prox  Full                                                        +---------+---------------+---------+-----------+----------+--------------+ FV Mid   Full                                                        +---------+---------------+---------+-----------+----------+--------------+ FV DistalFull                                                         +---------+---------------+---------+-----------+----------+--------------+ PFV      Full                                                        +---------+---------------+---------+-----------+----------+--------------+ POP      Full           Yes      Yes                                 +---------+---------------+---------+-----------+----------+--------------+ PTV      Full                                                        +---------+---------------+---------+-----------+----------+--------------+ PERO     Full                                                        +---------+---------------+---------+-----------+----------+--------------+  Summary: RIGHT: - There is no evidence of deep vein thrombosis in the lower extremity.  - No cystic structure found in the popliteal fossa.  LEFT: - There is no evidence of deep vein thrombosis in the lower extremity.  - No cystic structure found in the popliteal fossa.  *See table(s) above for measurements and observations. Electronically signed by Servando Snare MD on 05/10/2019 at 5:10:16 PM.    Final    ECHOCARDIOGRAM LIMITED  Result Date: 05/10/2019    ECHOCARDIOGRAM REPORT   Patient Name:   [facility] Date of Exam: 05/10/2019 Medical Rec #:  [id number]           Height:       70.0 in Accession #:    [id number]          Weight:       250.0 lb Date of Birth:  [birth year]           BSA:          2.295 m Patient Age:    32 years            BP:           114/62 mmHg Patient Gender: M                   HR:           72 bpm. Exam Location:  Inpatient Procedure: 2D Echo, Cardiac Doppler and Color Doppler Indications:    CHF-Acute Diastolic 0000000  History:        Patient has no prior history of Echocardiogram examinations.                 Risk Factors:Dyslipidemia, Diabetes and Former Smoker. Acute                 renal failure. Acute hypoxemic respiratory failure due to                 COVID-19. GERD.  Sonographer:    Clayton Lefort  RDCS (AE) Referring Phys: [id number] [facility]  Sonographer Comments: Technically challenging study due to limited acoustic windows. COVID-19. IMPRESSIONS  1. Left ventricular ejection fraction, by estimation, is 60 to 65%. The left ventricle has normal function. The left ventricle has no regional wall motion abnormalities. There is mild left ventricular hypertrophy. Left ventricular diastolic parameters are consistent with Grade I diastolic dysfunction (impaired relaxation).  2. Right ventricular systolic function is low normal. The right ventricular size is normal.  3. The mitral valve is grossly normal. Trivial mitral valve regurgitation.  4. The aortic valve is tricuspid. Aortic valve regurgitation is not visualized. Mild aortic valve sclerosis is present, with no evidence of aortic valve stenosis. FINDINGS  Left Ventricle: Left ventricular ejection fraction, by estimation, is 60 to 65%. The left ventricle has normal function. The left ventricle has no regional wall motion abnormalities. The left ventricular internal cavity size was normal in size. There is  mild left ventricular hypertrophy. Left ventricular diastolic parameters are consistent with Grade I diastolic dysfunction (impaired relaxation). Normal left ventricular filling pressure. Right Ventricle: The right ventricular size is normal. No increase in right ventricular wall thickness. Right ventricular systolic function is low normal. Left Atrium: Left atrial size was normal in size. Right Atrium: Right atrial size was normal in size. Pericardium: There is no evidence of pericardial effusion. Mitral Valve: The mitral valve is grossly normal. Trivial mitral valve regurgitation. Tricuspid Valve: The tricuspid valve is grossly  normal. Tricuspid valve regurgitation is trivial. Aortic Valve: The aortic valve is tricuspid. Aortic valve regurgitation is not visualized. Mild aortic valve sclerosis is present, with no evidence of aortic valve stenosis. Aortic  valve mean gradient measures 4.0 mmHg. Pulmonic Valve: The pulmonic valve was normal in structure. Pulmonic valve regurgitation is not visualized. Aorta: The aortic root and ascending aorta are structurally normal, with no evidence of dilitation. Venous: The inferior vena cava was not well visualized. IAS/Shunts: No atrial level shunt detected by color flow Doppler.  LEFT VENTRICLE PLAX 2D LVIDd:         4.10 cm  Diastology LVIDs:         2.70 cm  LV e' lateral:   9.90 cm/s LV PW:         1.20 cm  LV E/e' lateral: 3.9 LV IVS:        1.00 cm  LV e' medial:    6.53 cm/s LVOT diam:     1.90 cm  LV E/e' medial:  6.0 LV SV:         39 LV SV Index:   17 LVOT Area:     2.84 cm  LEFT ATRIUM         Index LA diam:    2.20 cm 0.96 cm/m  AORTIC VALVE AV Area (Vmax):    1.77 cm AV Area (Vmean):   1.68 cm AV Area (VTI):     1.66 cm AV Vmax:           145.00 cm/s AV Vmean:          87.900 cm/s AV VTI:            0.234 m AV Peak Grad:      8.4 mmHg AV Mean Grad:      4.0 mmHg LVOT Vmax:         90.70 cm/s LVOT Vmean:        52.200 cm/s LVOT VTI:          0.137 m LVOT/AV VTI ratio: 0.59  AORTA Ao Root diam: 3.30 cm MITRAL VALVE               TRICUSPID VALVE MV Area (PHT): 2.56 cm    TR Peak grad:   11.7 mmHg MV Decel Time: 296 msec    TR Vmax:        171.00 cm/s MV E velocity: 39.10 cm/s MV A velocity: 62.60 cm/s  SHUNTS MV E/A ratio:  0.62        Systemic VTI:  0.14 m                            Systemic Diam: 1.90 cm Lyman Bishop MD Electronically signed by Lyman Bishop MD Signature Date/Time: 05/10/2019/10:50:51 AM    Final    [place] EKG SITE RITE  Result Date: 05/29/2019 If Site Rite image not attached, placement could not be confirmed due to current cardiac rhythm.   Microbiology Recent Results (from the past 240 hour(s))  Aerobic/Anaerobic Culture (surgical/deep wound)     Status: None   Collection Time: 06/02/19  1:26 PM   Specimen: Pleural Fluid  Result Value Ref Range Status   Specimen Description FLUID LEFT  PLEURAL  Final   Special Requests Normal  Final   Gram Stain   Final    NO WBC SEEN MODERATE GRAM POSITIVE COCCI IN CHAINS IN CLUSTERS MODERATE GRAM NEGATIVE COCCOBACILLI  Culture   Final    ABUNDANT KLEBSIELLA OXYTOCA ABUNDANT PREVOTELLA BIVIA BETA LACTAMASE POSITIVE Performed at [facility] [address] [place], [place] [address]    Report Status Jun 14, 2019 FINAL  Final   Organism ID, Bacteria KLEBSIELLA OXYTOCA  Final      Susceptibility   Klebsiella oxytoca - MIC*    AMPICILLIN >=32 RESISTANT Resistant     CEFAZOLIN <=4 SENSITIVE Sensitive     CEFEPIME <=1 SENSITIVE Sensitive     CEFTAZIDIME <=1 SENSITIVE Sensitive     CEFTRIAXONE <=1 SENSITIVE Sensitive     CIPROFLOXACIN <=0.25 SENSITIVE Sensitive     GENTAMICIN <=1 SENSITIVE Sensitive     IMIPENEM <=0.25 SENSITIVE Sensitive     TRIMETH/SULFA <=20 SENSITIVE Sensitive     AMPICILLIN/SULBACTAM 8 SENSITIVE Sensitive     PIP/TAZO <=4 SENSITIVE Sensitive     * ABUNDANT KLEBSIELLA OXYTOCA    Lab Basic Metabolic Panel: Recent Labs  Lab 06/02/19 2035 06/02/19 2035 06/03/19 0531 06/03/19 1206 06/04/19 0522 06/05/19 0500 06/14/19 0423  NA 142   < > 141 140 142 145 146*  K 3.2*   < > 3.2* 2.8* 3.1* 4.1 4.0  CL 99  --  101  --  104 108 109  CO2 32  --  30  --  30 29 29   GLUCOSE 152*  --  143*  --  135* 132* 132*  BUN 38*  --  32*  --  29* 25* 27*  CREATININE 0.71  --  0.61  --  0.56* 0.59* 0.52*  CALCIUM 8.7*  --  8.7*  --  8.5* 8.6* 8.4*  MG  --   --  2.1  --  2.2 2.4 2.4  PHOS  --   --   --   --  3.6 3.1 3.4   < > = values in this interval not displayed.   Liver Function Tests: No results for input(s): AST, ALT, ALKPHOS, BILITOT, PROT, ALBUMIN in the last 168 hours. No results for input(s): LIPASE, AMYLASE in the last 168 hours. No results for input(s): AMMONIA in the last 168 hours. CBC: Recent Labs  Lab 06/02/19 0438 06/02/19 0438 06/03/19 0531 06/03/19 1206 06/04/19 0522 06/05/19 0500  06/14/2019 0423  WBC 16.2*  --  15.7*  --  13.4* 11.7* 9.6  NEUTROABS  --   --  13.3*  --   --   --   --   HGB 8.3*   < > 8.6* 9.2* 8.6* 8.7* 8.4*  HCT 27.8*   < > 29.4* 27.0* 29.0* 29.6* 28.9*  MCV 100.0  --  100.0  --  100.3* 101.0* 104.0*  PLT 454*  --  492*  --  500* 547* 520*   < > = values in this interval not displayed.   Cardiac Enzymes: No results for input(s): CKTOTAL, CKMB, CKMBINDEX, TROPONINI in the last 168 hours. Sepsis Labs: Recent Labs  Lab 05/31/19 0319 06/01/19 0330 06/03/19 0531 06/04/19 0522 06/05/19 0500 06/14/19 0423  PROCALCITON 1.26  --   --   --   --   --   WBC 19.1*   < > 15.7* 13.4* 11.7* 9.6   < > = values in this interval not displayed.   Lenice Llamas, MD Pulmonary and [place] Pager: [place]

## 2019-07-04 DEATH — deceased

## 2019-07-10 ENCOUNTER — Ambulatory Visit: Payer: PPO | Admitting: Family Medicine

## 2019-10-11 ENCOUNTER — Telehealth: Payer: Self-pay | Admitting: Family Medicine

## 2019-10-11 NOTE — Telephone Encounter (Signed)
Matthew Eaton's wife called me.  While he was in the hospital, she was not allowed to be in the room, and she did not understand a lot of things about his hospitalization, Covid-19, and ICU.  I did my best to explain to explain to the best of my ability.  Electronically Signed  By: Owens Loffler, MD On: 10/11/2019 4:49 PM

## 2021-04-18 IMAGING — DX DG CHEST 1V PORT
1 series · 1 of 1 positions shown · non-contrast
Comparison: 05/03/2019.

CLINICAL DATA: Shortness of breath.  COVID positive.

EXAM:
PORTABLE CHEST 1 VIEW

[chest ap]
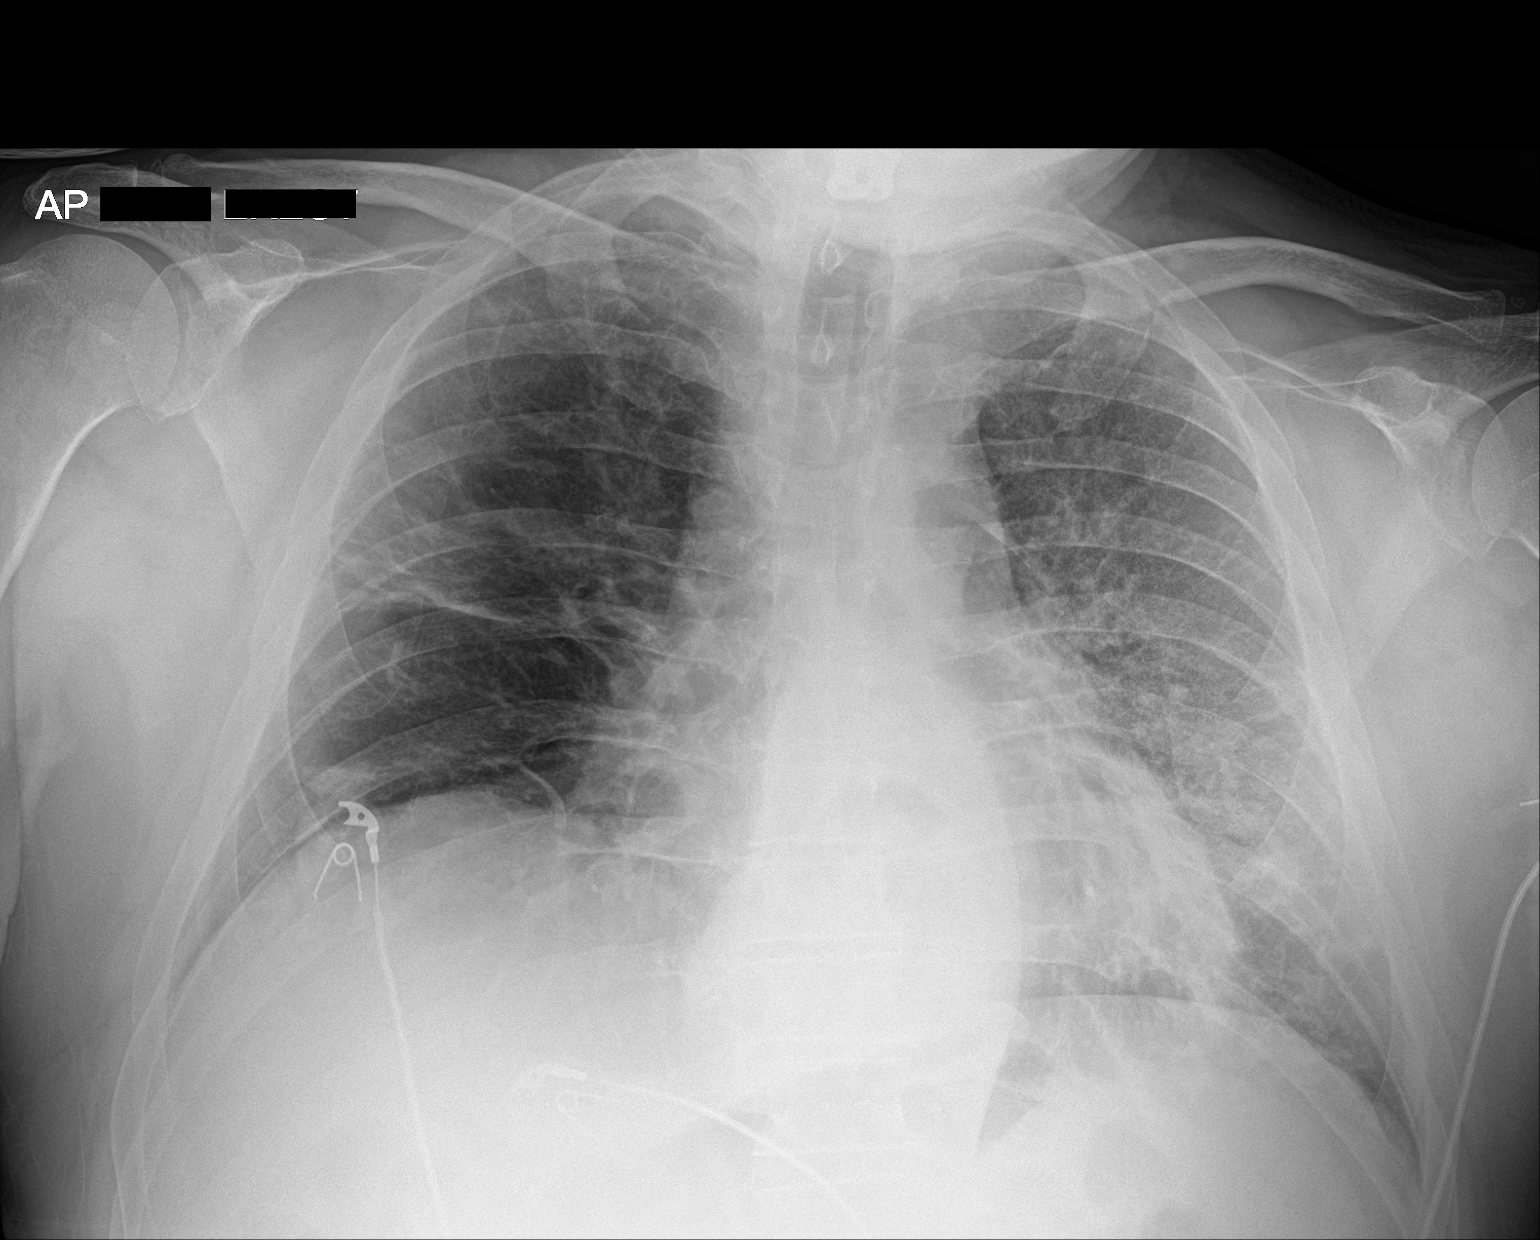

[1 of 1 positions shown; findings below may reference images not displayed]

FINDINGS: Mediastinum and hilar structures normal. Progressive infiltrate
noted throughout the left lung. Mild infiltrate in the right upper
lung. Bilateral subsegmental atelectasis. No pleural effusion or
pneumothorax. Mild cardiomegaly. No pulmonary venous congestion.
Sliding hiatal hernia. Thoracic spine scoliosis. No acute bony
abnormality
IMPRESSION: Progressive infiltrate noted throughout the left lung. Mild
infiltrate right upper lung. Bilateral subsegmental atelectasis.

## 2021-04-18 IMAGING — CT CT ANGIO CHEST
2 of 6 series · 19 of 46 positions shown · IV contrast (omnipaque)
Comparison: Chest x-ray from same day.

CLINICAL DATA: Shortness of breath. COVID positive.

EXAM:
CT ANGIOGRAPHY CHEST WITH CONTRAST
TECHNIQUE: Multidetector CT imaging of the chest was performed using the
standard protocol during bolus administration of intravenous
contrast. Multiplanar CT image reconstructions and MIPs were
obtained to evaluate the vascular anatomy.
CONTRAST:  100mL OMNIPAQUE IOHEXOL 350 MG/ML SOLN

[Series 6: thins · axial · 0.90mm/px · z∈[+954,+1223]mm · 16 of 295 slices shown]
[im 13/295  lung]
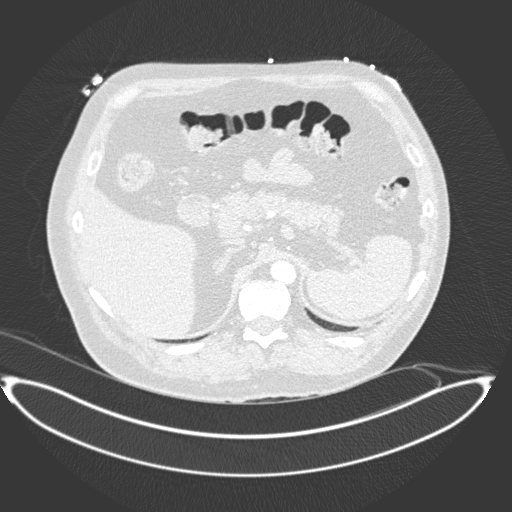
[im 39/295  soft-tissue]
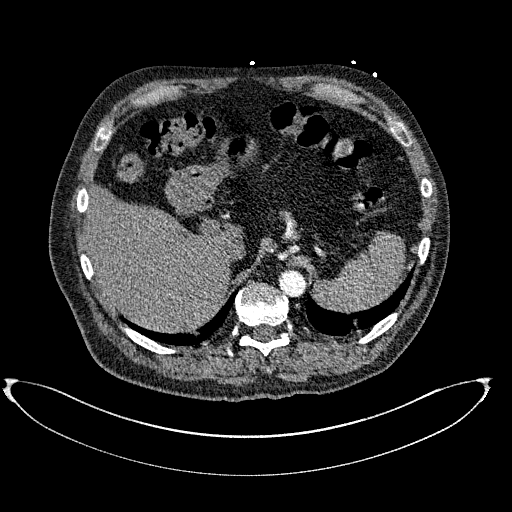
[im 52/295  lung]
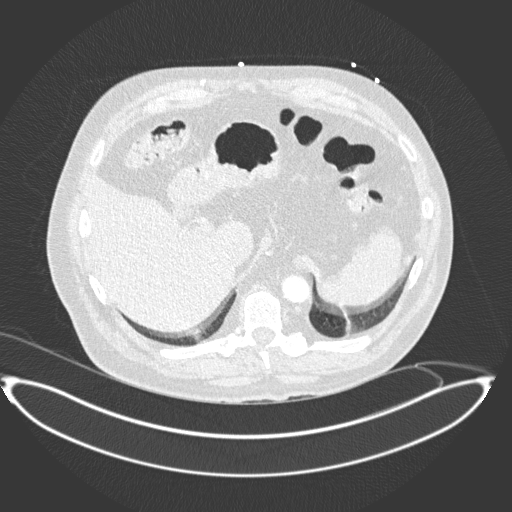
[im 64/295  soft-tissue]
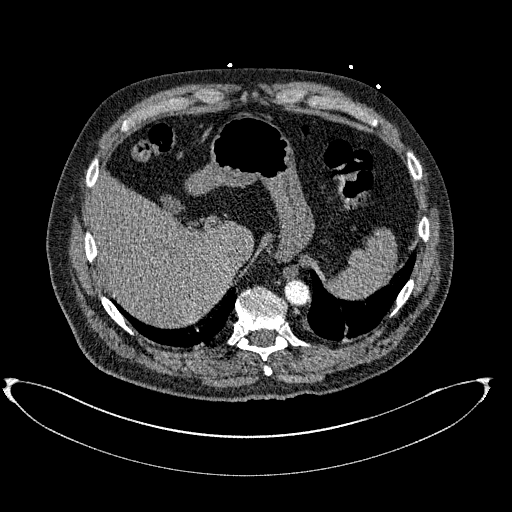
[im 90/295  lung]
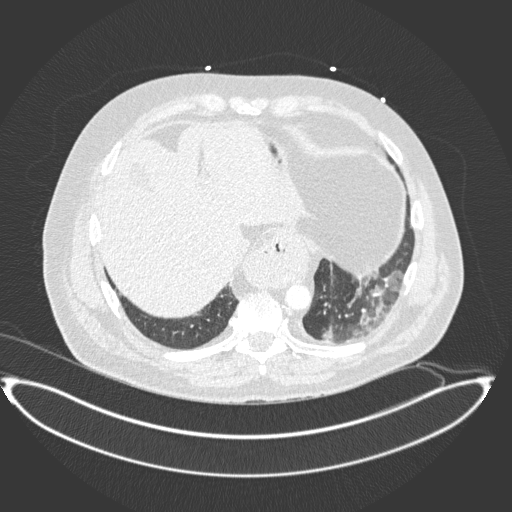
[im 103/295  soft-tissue]
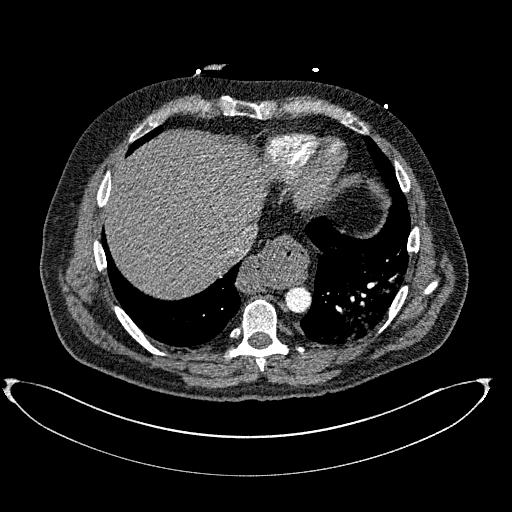
[im 116/295  lung]
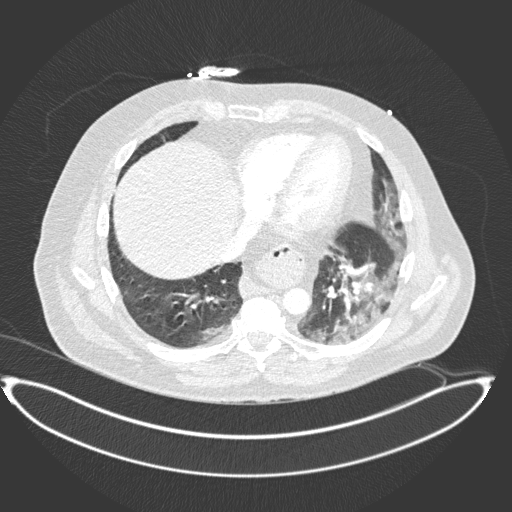
[im 141/295  soft-tissue]
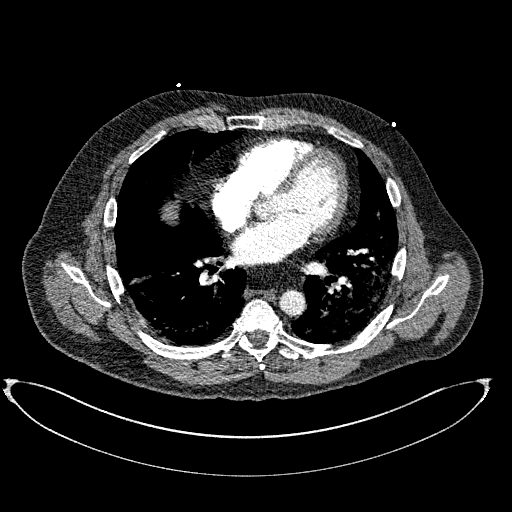
[im 154/295  lung]
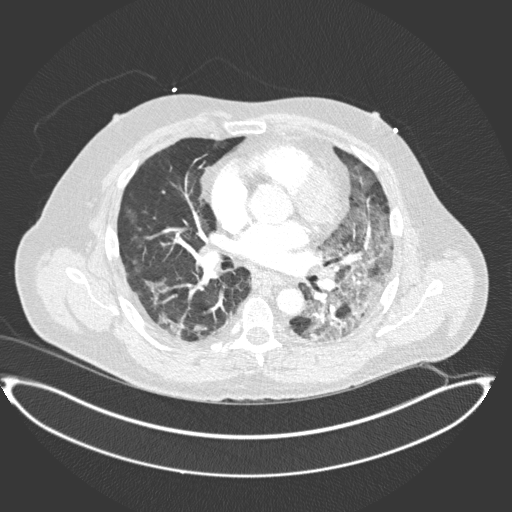
[im 179/295  soft-tissue]
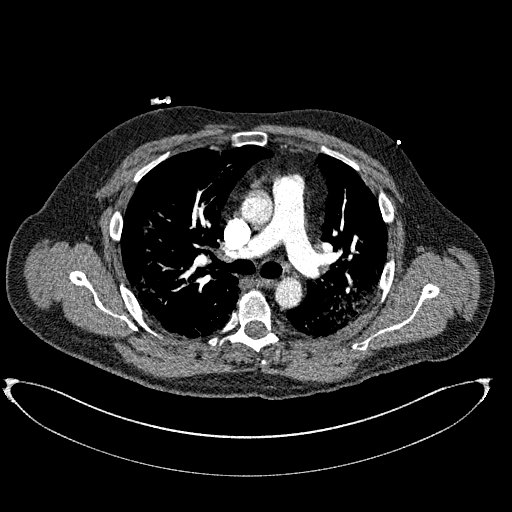
[im 192/295  lung]
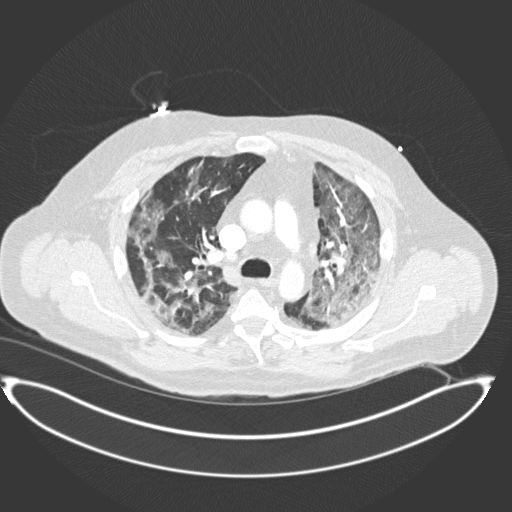
[im 205/295  soft-tissue]
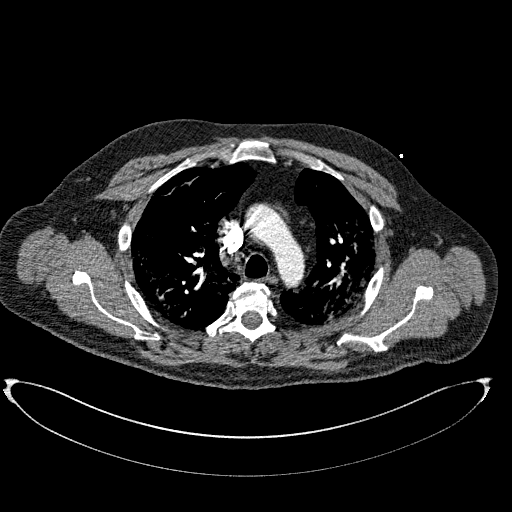
[im 231/295  lung]
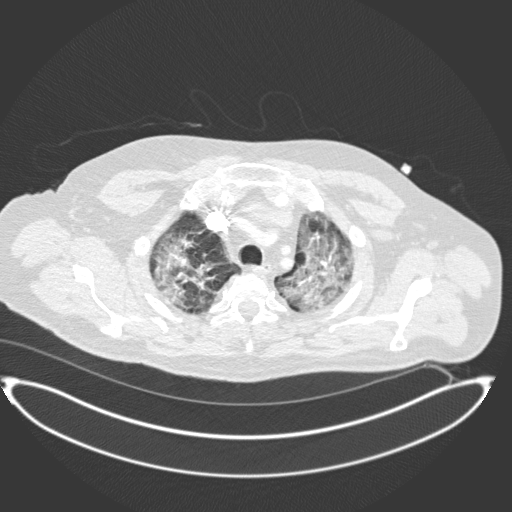
[im 243/295  soft-tissue]
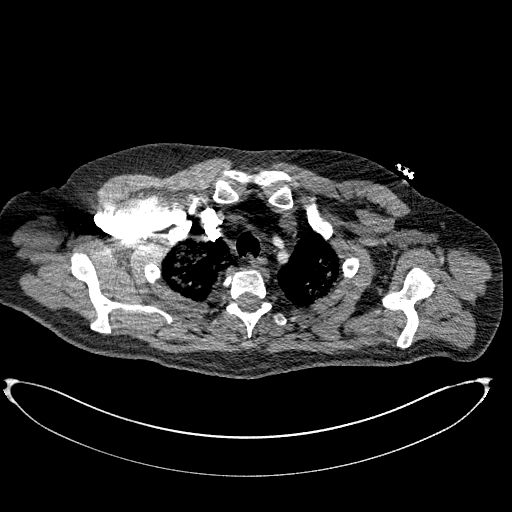
[im 256/295  lung]
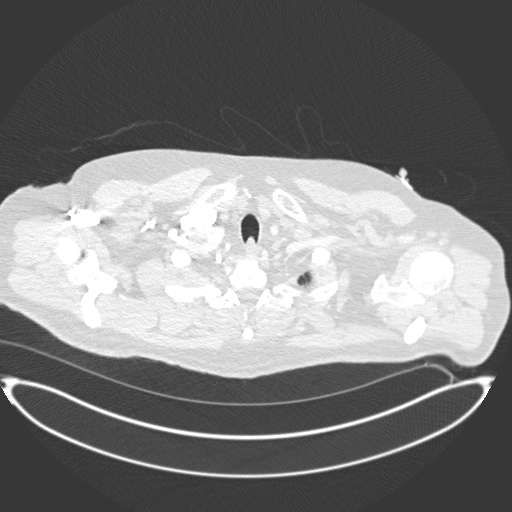
[im 282/295  soft-tissue]
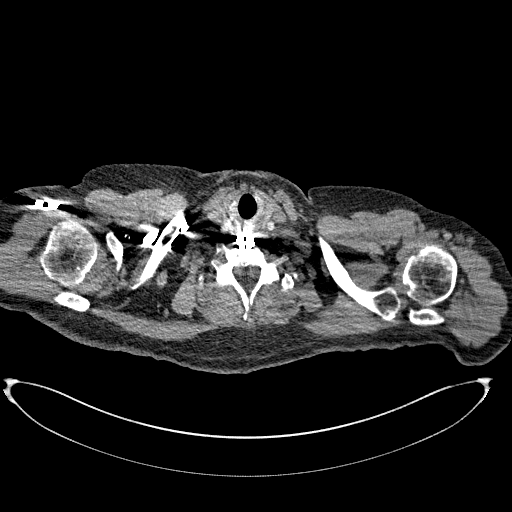

[Series 8: coronal mpr · coronal · 0.59mm/px · 3 of 151 slices shown]
[im 38/151  soft-tissue]
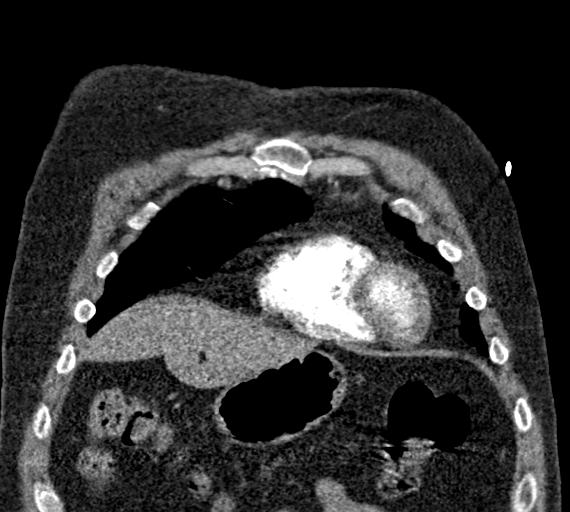
[im 76/151  soft-tissue]
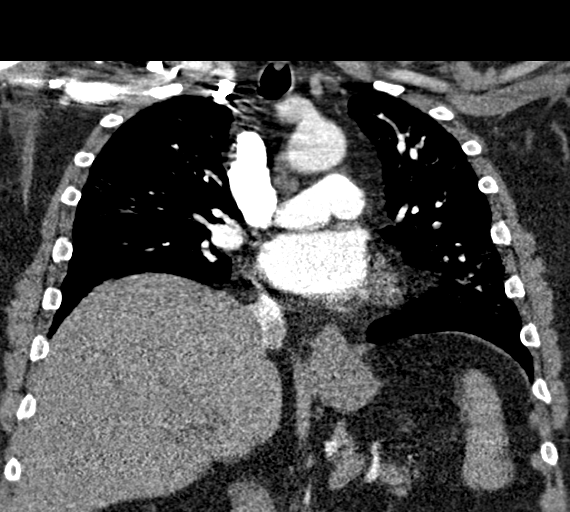
[im 113/151  soft-tissue]
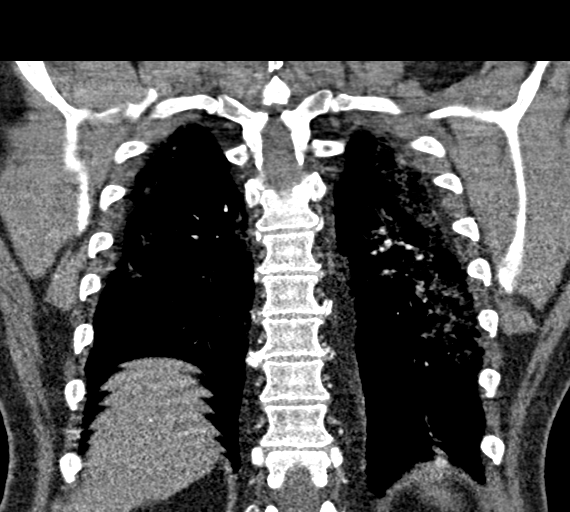

[19 of 46 positions shown; findings below may reference images not displayed]

FINDINGS: Cardiovascular: Satisfactory opacification of the pulmonary arteries
to the segmental level. No evidence of pulmonary embolism. Normal
heart size. No pericardial effusion. No thoracic aortic aneurysm or
dissection.

Mediastinum/Nodes: No enlarged mediastinal, hilar, or axillary lymph
nodes. Thyroid gland, trachea, and esophagus demonstrate no
significant findings.

Lungs/Pleura: Patchy bilateral somewhat confluent ground-glass
densities with areas of septal thickening in the left greater than
right upper and lower lobes. Areas of subpleural banding and
architectural distortion in the right lung. No pleural effusion or
pneumothorax.

Upper Abdomen: No acute abnormality. Small to moderate hiatal
hernia.

Musculoskeletal: No chest wall abnormality. No acute or significant
osseous findings.

Review of the MIP images confirms the above findings.
IMPRESSION: 1. No evidence of pulmonary embolism.
2. Multifocal pneumonia, consistent with history of [id number]
infection.
3. Hiatal hernia.
